# Patient Record
Sex: Male | Born: 1965
Health system: Southern US, Community
[De-identification: ages and names within clinical notes are randomized; demographics above are authoritative.]

## PROBLEM LIST (undated history)

## (undated) DIAGNOSIS — G4489 Other headache syndrome: Secondary | ICD-10-CM

## (undated) DIAGNOSIS — I10 Essential (primary) hypertension: Secondary | ICD-10-CM

## (undated) DIAGNOSIS — R569 Unspecified convulsions: Secondary | ICD-10-CM

## (undated) DIAGNOSIS — C801 Malignant (primary) neoplasm, unspecified: Secondary | ICD-10-CM

## (undated) DIAGNOSIS — C649 Malignant neoplasm of unspecified kidney, except renal pelvis: Secondary | ICD-10-CM

## (undated) DIAGNOSIS — H9319 Tinnitus, unspecified ear: Secondary | ICD-10-CM

## (undated) DIAGNOSIS — G4730 Sleep apnea, unspecified: Secondary | ICD-10-CM

## (undated) HISTORY — DX: Tinnitus, unspecified ear: H93.19

## (undated) HISTORY — PX: SHOULDER SURGERY: SHX246

## (undated) HISTORY — DX: Unspecified convulsions: R56.9

## (undated) HISTORY — PX: KNEE SURGERY: SHX244

## (undated) HISTORY — DX: Other headache syndrome: G44.89

## (undated) HISTORY — DX: Sleep apnea, unspecified: G47.30

---

## 2015-06-30 DIAGNOSIS — Z8249 Family history of ischemic heart disease and other diseases of the circulatory system: Secondary | ICD-10-CM | POA: Diagnosis not present

## 2015-06-30 DIAGNOSIS — E785 Hyperlipidemia, unspecified: Secondary | ICD-10-CM | POA: Diagnosis not present

## 2015-06-30 DIAGNOSIS — R0789 Other chest pain: Secondary | ICD-10-CM | POA: Diagnosis not present

## 2015-06-30 DIAGNOSIS — F17200 Nicotine dependence, unspecified, uncomplicated: Secondary | ICD-10-CM | POA: Diagnosis not present

## 2015-07-01 DIAGNOSIS — F17200 Nicotine dependence, unspecified, uncomplicated: Secondary | ICD-10-CM | POA: Diagnosis not present

## 2015-07-01 DIAGNOSIS — R0789 Other chest pain: Secondary | ICD-10-CM | POA: Diagnosis not present

## 2015-07-01 DIAGNOSIS — E785 Hyperlipidemia, unspecified: Secondary | ICD-10-CM | POA: Diagnosis not present

## 2015-07-01 DIAGNOSIS — Z8249 Family history of ischemic heart disease and other diseases of the circulatory system: Secondary | ICD-10-CM | POA: Diagnosis not present

## 2015-12-22 DIAGNOSIS — G4733 Obstructive sleep apnea (adult) (pediatric): Secondary | ICD-10-CM | POA: Diagnosis not present

## 2015-12-22 DIAGNOSIS — R569 Unspecified convulsions: Secondary | ICD-10-CM | POA: Diagnosis not present

## 2016-06-21 DIAGNOSIS — Z0000 Encounter for general adult medical examination without abnormal findings: Secondary | ICD-10-CM | POA: Diagnosis not present

## 2016-06-21 DIAGNOSIS — G8929 Other chronic pain: Secondary | ICD-10-CM | POA: Diagnosis not present

## 2016-06-21 DIAGNOSIS — G4733 Obstructive sleep apnea (adult) (pediatric): Secondary | ICD-10-CM | POA: Diagnosis not present

## 2016-06-21 DIAGNOSIS — R569 Unspecified convulsions: Secondary | ICD-10-CM | POA: Diagnosis not present

## 2016-06-21 DIAGNOSIS — M25562 Pain in left knee: Secondary | ICD-10-CM | POA: Diagnosis not present

## 2016-06-21 DIAGNOSIS — Z130 Encounter for screening for diseases of the blood and blood-forming organs and certain disorders involving the immune mechanism: Secondary | ICD-10-CM | POA: Diagnosis not present

## 2016-06-21 DIAGNOSIS — Z13220 Encounter for screening for lipoid disorders: Secondary | ICD-10-CM | POA: Diagnosis not present

## 2016-11-26 DIAGNOSIS — R569 Unspecified convulsions: Secondary | ICD-10-CM | POA: Diagnosis not present

## 2016-11-28 DIAGNOSIS — G4730 Sleep apnea, unspecified: Secondary | ICD-10-CM | POA: Diagnosis not present

## 2016-11-28 DIAGNOSIS — G4489 Other headache syndrome: Secondary | ICD-10-CM | POA: Diagnosis not present

## 2016-11-28 DIAGNOSIS — G40909 Epilepsy, unspecified, not intractable, without status epilepticus: Secondary | ICD-10-CM | POA: Diagnosis not present

## 2016-11-28 DIAGNOSIS — R569 Unspecified convulsions: Secondary | ICD-10-CM | POA: Diagnosis not present

## 2016-12-17 ENCOUNTER — Encounter: Payer: Self-pay | Admitting: Neurology

## 2016-12-18 ENCOUNTER — Other Ambulatory Visit: Payer: Self-pay | Admitting: Neurology

## 2016-12-18 ENCOUNTER — Ambulatory Visit (INDEPENDENT_AMBULATORY_CARE_PROVIDER_SITE_OTHER): Payer: BLUE CROSS/BLUE SHIELD | Admitting: Neurology

## 2016-12-18 ENCOUNTER — Encounter: Payer: Self-pay | Admitting: Neurology

## 2016-12-18 VITALS — BP 139/91 | HR 82 | Ht 67.0 in | Wt 234.0 lb

## 2016-12-18 DIAGNOSIS — G4733 Obstructive sleep apnea (adult) (pediatric): Secondary | ICD-10-CM | POA: Diagnosis not present

## 2016-12-18 DIAGNOSIS — R569 Unspecified convulsions: Secondary | ICD-10-CM

## 2016-12-18 DIAGNOSIS — Z9989 Dependence on other enabling machines and devices: Secondary | ICD-10-CM | POA: Diagnosis not present

## 2016-12-18 DIAGNOSIS — G40909 Epilepsy, unspecified, not intractable, without status epilepticus: Secondary | ICD-10-CM

## 2016-12-18 NOTE — Patient Instructions (Signed)
Seizure, Adult °When you have a seizure: °· Parts of your body may move. °· How aware or awake (conscious) you are may change. °· You may shake (convulse). ° °Some people have symptoms right before a seizure happens. These symptoms may include: °· Fear. °· Worry (anxiety). °· Feeling like you are going to throw up (nausea). °· Feeling like the room is spinning (vertigo). °· Feeling like you saw or heard something before (deja vu). °· Odd tastes or smells. °· Changes in vision, such as seeing flashing lights or spots. ° °Seizures usually last from 30 seconds to 2 minutes. Usually, they are not harmful unless they last a long time. °Follow these instructions at home: °Medicines °· Take over-the-counter and prescription medicines only as told by your doctor. °· Avoid anything that may keep your medicine from working, such as alcohol. °Activity °· Do not do any activities that would be dangerous if you had another seizure, like driving or swimming. Wait until your doctor approves. °· If you live in the [place] ask your local DMV (department of motor vehicles) when you can drive. °· Rest. °Teaching others °· Teach friends and family what to do when you have a seizure. They should: °? Lay you on the ground. °? Protect your head and body. °? Loosen any tight clothing around your neck. °? Turn you on your side. °? Stay with you until you are better. °? Not hold you down. °? Not put anything in your mouth. °? Know whether or not you need emergency care. °General instructions °· Contact your doctor each time you have a seizure. °· Avoid anything that gives you seizures. °· Keep a seizure diary. Write down: °? What you think caused each seizure. °? What you remember about each seizure. °· Keep all follow-up visits as told by your doctor. This is important. °Contact a doctor if: °· You have another seizure. °· You have seizures more often. °· There is any change in what happens during your seizures. °· You continue to have  seizures with treatment. °· You have symptoms of being sick or having an infection. °Get help right away if: °· You have a seizure: °? That lasts longer than 5 minutes. °? That is different than seizures you had before. °? That makes it harder to breathe. °? After you hurt your head. °· After a seizure, you cannot speak or use a part of your body. °· After a seizure, you are confused or have a bad headache. °· You have two or more seizures in a row. °· You are having seizures more often. °· You do not wake up right after a seizure. °· You get hurt during a seizure. °In an emergency: °· These symptoms may be an emergency. Do not wait to see if the symptoms will go away. Get medical help right away. Call your local emergency services (911 in the [place] Do not drive yourself to the hospital. °This information is not intended to replace advice given to you by your health care provider. Make sure you discuss any questions you have with your health care provider. °Document Released: 07/11/2007 Document Revised: 10/05/2015 Document Reviewed: 10/05/2015 °Elsevier Interactive Patient Education © 2017 Elsevier Inc. ° °

## 2016-12-18 NOTE — Progress Notes (Signed)
SLEEP MEDICINE CLINIC   Provider:  Larey Seat, M D  Primary Care Physician:  Glenford Bayley, DO   Referring Provider: Glenford Bayley, DO    Chief Complaint  Patient presents with  . New Patient (Initial Visit)    pt has had seizures about 5 years ago at which time he was worked up for sleep apnea. sleep study was completed in 2015. pt was started on CPAP.    HPI:  Vincent David is a 51 y.o. male , seen here  in a referral  from Dr. Marin Comment for evaluation of OSA and nocturnal seizures -He had a seizure on  11-25-2016   He is reports that his very first seizure that was noticed at that took place probably in the year 2012 prior to his first sleep evaluation.  He also had a seizure during sleep in January 2013 and was hospitalized EEG in the hospital at the time at [facility] returned negative.  There was no special etiology found when he had yet another nocturnal seizure in July 2013.  He was not on any medications at the time and had not started on antiepileptic medications.  It was noted that the patient was a current smoker, that he drinks 3-4 glasses of wine per week, that he also wakes up with a dry mouth.  He had no history of documented traumatic brain injury but reports a head injury during [employer] in [place] , in [place]. He was treated for complicated migraines, with a visual aura.   .   No PLM Arousals and he was prescribed a ResMed Quattro FFM in medium size with heated humidifier and an EPR pressure of 3 cm water.    Chief complaint according to patient : I need CPAP supplies.  CPAP was downloaded by my staff today and shows 100% compliant patient with an average use at time of 8 hours and 24 minutes, a residual AHI of only 1.2 apneas per hour, a CPAP setting of 11 cmH2O was 3 cm EPR.  Sleep habits are as follows: At about 9 PM, generally asleep rather promptly.  To sleep on his side his bedroom is cool, quiet and dark.  Conducive to sleep.  He uses 2 pillows.  He sleeps through the the first 3-4 hours of the night - and wakes up at 4.30 AM, no nocturia. His wife has noted rarely snoring break through on CPAP.  He has a chronically congested nose with a septal deviation, to the left.  The patient is a habitual mouth breather due to his nasal patency. Sleeps about 6-7 hours. He naps on week ends for 1-2 hours. He wakes up generally refreshed and restored.    Sleep medical history and family sleep history:  Nocturnal seizures, his wife usually wakes him. He is confused and very tired. Tongue bite. Urinary incontinence.     Social history: Married.  Active smoker, 1 ppd, ETOH-  2 drinks a day, with dinner- 5 beers when golfing.  caffeine use, 2 coffees in AM- iced tea,decaffeinated. Diet coke 1-2 a day.   Review of Systems: Out of a complete 14 system review, the patient complains of only the following symptoms, and all other reviewed systems are negative. Seizures at night.   Epworth score  3 , Fatigue severity score 18  , depression score 2/1 5    Social History   Socioeconomic History  . Marital status: Married    Spouse name: Not on file  .  Number of children: Not on file  . Years of education: Not on file  . Highest education level: Not on file  Social Needs  . Financial resource strain: Not on file  . Food insecurity - worry: Not on file  . Food insecurity - inability: Not on file  . Transportation needs - medical: Not on file  . Transportation needs - non-medical: Not on file  Occupational History  . Not on file  Tobacco Use  . Smoking status: Current Every Day Smoker    Packs/day: 1.00  . Smokeless tobacco: Never Used  Substance and Sexual Activity  . Alcohol use: Yes  . Drug use: No  . Sexual activity: Not on file  Other Topics Concern  . Not on file  Social History Narrative  . Not on file    No family history on file.  Past Medical History:  Diagnosis Date  . Postictal headache   . Seizure ([place])   . Sleep apnea       No past surgical history on file.  Current Outpatient Medications  Medication Sig Dispense Refill  . levETIRAcetam (KEPPRA) 1000 MG tablet Take 1,000 mg 2 (two) times daily by mouth.    . levETIRAcetam (KEPPRA) 500 MG tablet Take 500 mg at bedtime by mouth.     No current facility-administered medications for this visit.     Allergies as of 12/18/2016  . (Not on File)    Vitals: BP (!) 139/91   Pulse 82   Ht 5\' 7"  (1.702 m)   Wt 234 lb (106.1 kg)   BMI 36.65 kg/m  Last Weight:  Wt Readings from Last 1 Encounters:  12/18/16 234 lb (106.1 kg)   HBZ:JIRC mass index is 36.65 kg/m.     Last Height:   Ht Readings from Last 1 Encounters:  12/18/16 5\' 7"  (1.702 m)    Physical exam:  General: The patient is awake, alert and appears not in acute distress. The patient is well groomed. Head: Normocephalic, atraumatic. Neck is supple. Mallampati 4,  neck circumference:18.5 . Nasal airflow congested ,.  Cardiovascular:  Regular rate and rhythm, without  murmurs or carotid bruit, and without distended neck veins. Respiratory: Lungs are clear to auscultation. Skin:  Without evidence of edema,  Facial erythema,  Rosacea? Trunk: BMI is 37. The patient's posture is erect   Neurologic exam : The patient is awake and alert, oriented to place and time.    Attention span & concentration ability appears normal.  Speech is fluent,  without dysarthria, dysphonia or aphasia.  Mood and affect are appropriate.  Cranial nerves: Pupils are equal and briskly reactive to light. Icteric changes in both sclerea. Extraocular movements  in vertical and horizontal planes intact and without nystagmus. Visual fields by finger perimetry are intact. Hearing to finger rub intact.  Facial sensation intact to fine touch.Facial motor strength is symmetric and tongue and uvula move midline. Shoulder shrug was symmetrical. Left shoulder ROM restricted.   Motor exam:   Normal tone, muscle bulk and symmetric  strength in all extremities. Sensory:  Fine touch, pinprick and vibration were tested in all extremities. Proprioception tested in the upper extremities was normal. Coordination: Rapid alternating movements in the fingers/hands was normal. Finger-to-nose maneuver  normal without evidence of ataxia, dysmetria or tremor. Gait and station: Patient walks without assistive device and is able unassisted to climb up to the exam table. Deep tendon reflexes: in the  upper and lower extremities are symmetric and intact. Babinski  maneuver response is  downgoing.   Assessment:  After physical and neurologic examination, review of laboratory studies,  Personal review of imaging studies, reports of other /same  Imaging studies, results of polysomnography and / or neurophysiology testing and pre-existing records as far as provided in visit., my assessment is   1) Vincent David will need new supplies for his current CPAP machine, which is too new to be replaced at this time.  He endorsed a low-grade fatigue and daytime sleepiness scale the Epworth score was endorsed at 3 points fatigue severity at 18 point and there is no indication that his CPAP would not serve him very well at this time.  I was able to get a compliance report and he is 100% compliant patient.  He requests also a new Quatro size he will need filter, tubing   2) the patient has a history of nocturnal seizures which still occur after he began medication treatment with generic Keppra, namely levetiracetam he has been on this medication for about about 3-1/2 years.  Infrequently to have nocturnal seizures. His wife added that they actually followed a crescendo pattern and have become more frequent this led to an increase in Keppra dosing and he is currently taking 2500 mg daily divided twice daily.   3) [facility] evaluation for seizures q 3-6 month. Has not had a EEG, only a CT>   The patient was advised of the nature of the diagnosed disorder , the treatment  options and the  risks for general health and wellness arising from not treating the condition.   I spent more than 45  minutes of face to face time with the patient.  Greater than 50% of time was spent in counseling and coordination of care. We have discussed the diagnosis and differential and I answered the patient's questions.    Plan:  Treatment plan and additional workup : Nocturnal seizure disorder requiring a was expanded EEG for seizure montage.  Patient will receive new CPAP supplies for his current machine which is about 72-1/53 years old.  The placement of the CPAP machine is usually not done before the machine is 53 years old. Patient is taking anti-epileptica.  He is taking a generic form of Keppra. He can stay on this medication. I will order a prolonged EEG.  Seizure activity has been noted to be closely correlated to the first 2 or 3 hours of sleep and extensions that have been seizures noted in the last hour of sleep.  Never had a daytime seizure. And an overnight EEG in EMU in [place], [place].  Negative. EEG -  Repeat  SUDEP education.      Larey Seat, MD 16/11/9602, 54:09 PM  Certified in Neurology by ABPN Certified in [place] by [facility] [address], [place], [place] [address]

## 2016-12-18 NOTE — Addendum Note (Signed)
Addended by: Larey Seat on: 12/18/2016 01:05 PM   Modules accepted: Orders

## 2016-12-24 ENCOUNTER — Telehealth: Payer: Self-pay

## 2016-12-24 NOTE — Telephone Encounter (Signed)
Patient was contacted by me to schedule sleep study. He was asking about Rx for supplies sent to DME. Saw no note about this.  Pt stated that he is not currently getting any supplies for CPAP machine.  Please check into this and follow up with pt. Thank You!!

## 2016-12-24 NOTE — Telephone Encounter (Signed)
Called the patient to make him aware that an order was sent to [facility] on 12/18/2016. I have given him the number for [facility] so that he can reach out to them personally.

## 2017-02-07 ENCOUNTER — Ambulatory Visit (INDEPENDENT_AMBULATORY_CARE_PROVIDER_SITE_OTHER): Payer: BLUE CROSS/BLUE SHIELD | Admitting: Neurology

## 2017-02-07 DIAGNOSIS — R569 Unspecified convulsions: Secondary | ICD-10-CM

## 2017-02-07 DIAGNOSIS — G4733 Obstructive sleep apnea (adult) (pediatric): Secondary | ICD-10-CM

## 2017-02-07 DIAGNOSIS — Z9989 Dependence on other enabling machines and devices: Principal | ICD-10-CM

## 2017-02-07 DIAGNOSIS — G40909 Epilepsy, unspecified, not intractable, without status epilepticus: Secondary | ICD-10-CM

## 2017-02-11 ENCOUNTER — Other Ambulatory Visit: Payer: Self-pay | Admitting: Neurology

## 2017-02-11 DIAGNOSIS — G4733 Obstructive sleep apnea (adult) (pediatric): Secondary | ICD-10-CM

## 2017-02-11 DIAGNOSIS — E669 Obesity, unspecified: Secondary | ICD-10-CM

## 2017-02-11 DIAGNOSIS — G4761 Periodic limb movement disorder: Secondary | ICD-10-CM

## 2017-02-11 NOTE — Procedures (Signed)
PATIENT'S NAME:  Vincent David, Vincent David DOB:      Oct 16, 1965      MRN:    [id number]     DATE OF RECORDING: 02/07/2017 REFERRING M.D.:  Rikki Spearing, M.D. Study Performed:   Baseline Polysomnogram with EEG montage HISTORY:   52 year old male Seizure patient reports his very first seizure took place probably in the year 2012 prior to his first sleep evaluation.  He also had a seizure during sleep in January 2013 and was hospitalized for EEG in the hospital at the time at [facility], [place]- the EEG returned negative.  There was no special etiology found when he had yet another nocturnal seizure in July 2013.  He was not on any medications at the time and had not started on antiepileptic medications. Last seizure on 02-26-2016 and referred here on Keppra.  He reports a head injury during [employer] in 1986. He was treated for complicated migraines with visual aura.  The patient endorsed the Epworth Sleepiness Scale at 3 points.  The patient's weight 234 pounds with a height of 67 (inches), resulting in a BMI of 36.7 kg/m2.The patient's neck circumference measured 18 inches.  CURRENT MEDICATIONS: Keppra   PROCEDURE:  This is a multichannel digital polysomnogram utilizing the Somnostar 11.2 system.  Electrodes and sensors were applied and monitored per AASM Specifications.   EEG, EOG, Chin and Limb EMG, were sampled at 200 Hz.  ECG, Snore and Nasal Pressure, Thermal Airflow, Respiratory Effort, CPAP Flow and Pressure, Oximetry was sampled at 50 Hz. Digital video and audio were recorded.      BASELINE STUDY: Lights Out was at 21:12 and Lights On at 04:29.  Total recording time (TRT) was 438 minutes, with a total sleep time (TST) of 358 minutes.   The patient's sleep latency was 53 minutes.  REM latency was 178.5 minutes.  The sleep efficiency was 81.7 %.     SLEEP ARCHITECTURE: WASO (Wake after sleep onset) was 41.5 minutes.  There were 70.5 minutes in Stage N1, 193 minutes Stage N2, 46.5 minutes Stage N3 and  48 minutes in Stage REM.  The percentage of Stage N1 was 19.7%, Stage N2 was 53.9%, Stage N3 was at 13.0% and Stage R (REM sleep) was 13.4%.   RESPIRATORY ANALYSIS:  There were a total of 82 respiratory events:  2 obstructive apneas, 12 central apneas and 0 mixed apneas with 68 hypopneas. The patient also had 2 respiratory event related arousals (RERAs).The total APNEA/HYPOPNEA INDEX (AHI) was 13.7/hour and the total RESPIRATORY DISTURBANCE INDEX was 14.1 /hour.   6 events occurred in REM sleep and 136 events in NREM. The REM AHI was 7.5 /hour, versus a non-REM AHI of 14.7. The patient spent 0 minutes of total sleep time in the supine position and 358 minutes in non-supine, with a non-supine AHI of 13.7.  OXYGEN SATURATION & C02: The Wake baseline 02 saturation was 94%, with the lowest being 87%. Time spent below 89% saturation equaled 5 minutes.   PERIODIC LIMB MOVEMENTS:  The patient had a total of 168 Periodic Limb Movements.  The Periodic Limb Movement (PLM) index was 28.2 and the PLM Arousal index was 6.2/hour.  The arousals were noted as: 63 were spontaneous, 37 were associated with PLMs, and 36 were associated with respiratory events. Loud Snoring was noted, but only mild apnea seen. Audio and video analysis did not show any abnormal or unusual movements, behaviors, phonations or vocalizations.  Respiratory events all begun after midnight and were  unrelated to REM/NREM or sleep position (left versus right).  The patient took one bathroom break. EKG was in keeping with normal sinus rhythm (NSR) with some PACs.  Post-study, the patient indicated that sleep was worse than usual.   IMPRESSION:  1. Mild Obstructive Sleep Apnea (OSA) with an AHI of 13.7/hr. associated with loud snoring, but neither hypoxemia nor hypercapnia. 2. Severe Periodic Limb Movement Disorder (PLMD) 3. Thunderous Snoring 4. Repetitive Intrusions of Sleep, but EEG was without evidence of seizure activity.    RECOMMENDATIONS: 1. Advise full-night, attended, CPAP titration study to optimize therapy. Avoid sedative-hypnotics which may worsen sleep apnea, and to lose weight by diet and exercise if not contraindicated (BMI 36.7). 2. Further information regarding OSA may be obtained from [employer] (www.sleepfoundation.org) or American Sleep Apnea Association (www.sleepapnea.org).   3. There were frequent periodic limb movements of sleep (PLMS) with associated sleep disruption.  Consider treating the PLMS primarily if CPAP does not reduce the arousal frequency.   4. Correlate clinically for a history consistent with regarding restless legs syndrome (RLS).    Consider secondary restless legs syndrome.  Pharmacotherapy may be warranted.  Obtain a serum ferritin level if the clinical history is consistent with RLS.  Consider iron therapy and evaluation for iron deficiency anemia if the serum ferritin level < 50 ng/ml.  5. Certain medications or substances may aggravate RLS and common offenders may include the following:  nicotine, caffeine, SSRIs, TCAs, phenothiazine, dopamine antagonists, diphenhydramine, and alcohol.   6. A follow up appointment will be scheduled in the Sleep Clinic at [facility]. The referring provider will be notified of the results.      I certify that I have reviewed the entire raw data recording prior to the issuance of this report in accordance with the Standards of Accreditation of the American Academy of Sleep Medicine (AASM)   Larey Seat, MD     02-11-2017 Diplomat, American Board of Psychiatry and Neurology  Diplomat, American Board of [place] Director, [employer] [employer]

## 2017-02-12 ENCOUNTER — Telehealth: Payer: Self-pay | Admitting: Neurology

## 2017-02-12 NOTE — Telephone Encounter (Signed)
-----   Message from Larey Seat, MD sent at 02/11/2017  5:17 PM EST ----- Snoring and OSA  as well as PLMs found- Return for CPAP titration, with expanded seizure montage. CD

## 2017-02-12 NOTE — Telephone Encounter (Signed)
Called patient to discuss sleep study results. No answer at this time. LVM for the patient to call back.   

## 2017-02-12 NOTE — Telephone Encounter (Signed)
Pt returned call. I advised pt that Dr. Dohmeier reviewed their sleep study results and found that has sleep apnea  and recommends that pt be treated with a cpap. Dr. Dohmeier recommends that pt return for a repeat sleep study in order to properly titrate the cpap and ensure a good mask fit. Pt is agreeable to returning for a titration study. I advised pt that our sleep lab will file with pt's insurance and call pt to schedule the sleep study when we hear back from the pt's insurance regarding coverage of this sleep study. Pt verbalized understanding of results. Pt had no questions at this time but was encouraged to call back if questions arise.   

## 2017-03-29 ENCOUNTER — Ambulatory Visit (INDEPENDENT_AMBULATORY_CARE_PROVIDER_SITE_OTHER): Payer: BLUE CROSS/BLUE SHIELD | Admitting: Neurology

## 2017-03-29 DIAGNOSIS — G4733 Obstructive sleep apnea (adult) (pediatric): Secondary | ICD-10-CM | POA: Diagnosis not present

## 2017-03-29 DIAGNOSIS — G4761 Periodic limb movement disorder: Secondary | ICD-10-CM

## 2017-03-29 DIAGNOSIS — E669 Obesity, unspecified: Secondary | ICD-10-CM

## 2017-04-01 ENCOUNTER — Other Ambulatory Visit: Payer: Self-pay | Admitting: Neurology

## 2017-04-01 DIAGNOSIS — G4733 Obstructive sleep apnea (adult) (pediatric): Secondary | ICD-10-CM

## 2017-04-01 DIAGNOSIS — Z9989 Dependence on other enabling machines and devices: Secondary | ICD-10-CM

## 2017-04-01 DIAGNOSIS — G4731 Primary central sleep apnea: Principal | ICD-10-CM

## 2017-04-01 DIAGNOSIS — G4739 Other sleep apnea: Secondary | ICD-10-CM

## 2017-04-01 NOTE — Procedures (Signed)
PATIENT'S NAME:  Vincent David, Vincent David DOB:      1965-06-26      MR#:    [id number]     DATE OF RECORDING: 03/29/2017 REFERRING M.D.:  Rikki Spearing, MD Study Performed:   Titration to CPAP, BiPAP and ASV modalities.  HISTORY:  This 52 year old male patient of Rikki Spearing, MD, has seizures and is a CPAP user. He is returning for CPAP titration following a PSG performed on 02/07/2017 which resulted in an AHI of 13.7/hr., loud snoring, severe PLMD, and normal sleep EEG. There were neither hypoxemia nor hypercapnia noted.  SpO2 Nadir was 87%. The patient endorsed the Epworth Sleepiness Scale at 3 points.  The patient's weight 234 pounds with a height of 67 (inches), resulting in a BMI of 36.7 kg/m2.The patient's neck circumference measured 18 inches.  CURRENT MEDICATIONS: Keppra   PROCEDURE:  This is a multichannel digital polysomnogram utilizing the SomnoStar 11.2 system.  Electrodes and sensors were applied and monitored per AASM Specifications.   EEG, EOG, Chin and Limb EMG, were sampled at 200 Hz.  ECG, Snore and Nasal Pressure, Thermal Airflow, Respiratory Effort, CPAP Flow and Pressure, Oximetry was sampled at 50 Hz. Digital video and audio were recorded.      CPAP was initiated at 5 cmH20 with heated humidity per AASM split night standards and pressure was advanced to 15 cm water, the technician changed to BiPAP and ASV mode during the same night- BiPAP final pressure of 18/14cmH20 with or without ST did not improve on hypopneas, apneas and created a [employer] pattern ( AHI of 45). The modality was changed to ASV which helped little, reducing the AHI to 13/hr.     At a CPAP pressure of 12 cmH20, there was a reduction of the AHI to 0.0 with Spo2 nadir at 92% and 110 minutes of sleep time with improvement of obstructive sleep apnea.    Lights Out was at 21:02 and Lights On at 05:00. Total recording time (TRT) was 478.5 minutes, with a total sleep time (TST) of 398 minutes. The patient's sleep latency was 13 minutes  with 5 minutes of wake time after sleep onset. REM latency was 69 minutes.  The sleep efficiency was 83.2 %.    SLEEP ARCHITECTURE: WASO (Wake after sleep onset) was 68 minutes.  There were 45 minutes in Stage N1, 152.5 minutes Stage N2, 101 minutes Stage N3 and 99.5 minutes in Stage REM.  The percentage of Stage N1 was 11.3%, Stage N2 was 38.3%, Stage N3 was 25.4% and Stage R (REM sleep) was 25%.   RESPIRATORY ANALYSIS:  There was a total of 22 respiratory events: 1 obstructive apnea, 21 central apneas and 0 hypopneas with 5 respiratory event related arousals (RERAs).     The total APNEA/HYPOPNEA INDEX (AHI) was 3.3 /hour and the total RESPIRATORY DISTURBANCE INDEX was 4.1 .hour. 7 events occurred in REM sleep and 15 events in NREM. The REM AHI was 4.2 /hr. versus a non-REM AHI of 3.0 /hr.  The patient spent 66.5 minutes of total sleep time in the supine position and 332 minutes in non-supine. The supine AHI was 19.8, versus a non-supine AHI of 0.0/hr.  OXYGEN SATURATION & C02:  The baseline 02 saturation was 94%, with the lowest being 88%. Time spent below 89% saturation equaled 1 minute.  PERIODIC LIMB MOVEMENTS:   The patient had a total of 0 Periodic Limb Movements. The arousals were noted as: 80 were spontaneous, 0 were associated with PLMs, and  9 were associated with respiratory events. Audio and video analysis did not show any abnormal or unusual movements, behaviors, phonations or vocalizations.   The patient took one bathroom break. Snoring was no longer noted. EKG was in keeping with normal sinus rhythm (NSR).  CPAP worked sufficiently as long as the patient remained in a non-supine sleep position.   Post-study, the patient indicated that sleep was the same as usual.  The patient was fitted with a ResMed AirFit F30 (FFM) in medium. He prefers his FFM from home .   DIAGNOSIS 1. Obstructive Sleep Apnea, supine dependent, responding to CPAP at 9 through 12 cm water pressure, but only in  non -supine sleep position.  The lateral sleep position is difficult for a full face mask user.  2. Central Sleep Apnea was triggered by BiPAP pressure 18/14 cm.  3. ASV had little benefit.  PLANS/RECOMMENDATIONS: We will provide the patient with an auto titration CPAP, between 6 and 12 cm water pressure, heated humidity- and he may name the mask of his choice. 1. Educate patient in sleep hygiene measures and Tennis ball method. The patient needs to avoid supine sleep position.  Achieve and Maintain lean body weight (BMI indicative of morbid obesity at 37). 2. Positional therapy:  This study indicates that patient has significantly more apneas in the supine position.  It is recommended that the patient use a device which encourages sleep in the non-supine position (e.g., tennis ball-in-back method, using fanny pack or similar device to position elastic ball in small of back during sleep, in order to encourage sleep in the non- supine position). DISCUSSION:  A follow up appointment will be scheduled in the Sleep Clinic at [facility].   Please call [phone] with any questions.     I certify that I have reviewed the entire raw data recording prior to the issuance of this report in accordance with the Standards of Accreditation of the American Academy of Sleep Medicine (AASM)  Larey Seat, M.D.     04-01-2017  Diplomat, American Board of Psychiatry and Neurology  Diplomat, [place] of Sleep Medicine Medical Director, [place] [facility] at [facility]

## 2017-04-02 ENCOUNTER — Telehealth: Payer: Self-pay | Admitting: Neurology

## 2017-04-02 NOTE — Telephone Encounter (Signed)
I called pt. I advised pt that Dr. Brett Fairy reviewed their sleep study results and found that pt has sleep apnea. Dr. Brett Fairy recommends that pt has sleep apnea. I reviewed PAP compliance expectations with the pt. Pt is agreeable to starting a CPAP. I advised pt that an order will be sent to a DME, [facility], and [facility] will call the pt within about one week after they file with the pt's insurance. [facility] will show the pt how to use the machine, fit for masks, and troubleshoot the CPAP if needed. A follow up appt was made for insurance purposes with Dr Brett Fairy on Jun 18, 2017 at 8:30 am. Pt verbalized understanding to arrive 15 minutes early and bring their CPAP. A letter with all of this information in it will be mailed to the pt as a reminder. I verified with the pt that the address we have on file is correct. Pt verbalized understanding of results. Pt had no questions at this time but was encouraged to call back if questions arise.

## 2017-04-02 NOTE — Telephone Encounter (Signed)
-----   Message from Larey Seat, MD sent at 04/01/2017  5:56 PM EST ----- PATIENT'S NAME:  Vincent David, Vincent David DOB:      Jul 28, 1965      MR#:    [id number]     DATE OF RECORDING: 03/29/2017 REFERRING M.D.:  Rikki Spearing, MD Study Performed:   Titration to CPAP, BiPAP and ASV modalities.  HISTORY:  This 52 year old male patient of Rikki Spearing, MD, has seizures and is a CPAP user. He is returning for CPAP titration following a PSG performed on 02/07/2017 which resulted in an AHI of 13.7/hr., loud snoring, severe PLMD, and normal sleep EEG. There were neither hypoxemia nor hypercapnia noted.  SpO2 Nadir was 87%. The patient endorsed the Epworth Sleepiness Scale at 3 points.  The patient's weight 234 pounds with a height of 67 (inches), resulting in a BMI of 36.7 kg/m2.The patient's neck circumference measured 18 inches.  CPAP worked sufficiently as long as the patient remained in a non-supine sleep position.  The patient was fitted with a ResMed AirFit F30 (FFM) in medium. He prefers his FFM from home .   DIAGNOSIS 1. Obstructive Sleep Apnea, supine dependent, responding to CPAP at 9 through 12 cm water pressure, but only in non -supine sleep position.  The lateral sleep position is difficult for a full face mask user.  2. Central Sleep Apnea was triggered by BiPAP pressure 18/14 cm.  3. ASV had little benefit.  PLANS/RECOMMENDATIONS: We will provide the patient with an auto titration CPAP, between 6 and 12 cm water pressure, heated humidity- and he may name the mask of his choice. 1. Educate patient in sleep hygiene measures and Tennis ball method. The patient needs to avoid supine sleep position.  Achieve and Maintain lean body weight (BMI indicative of morbid obesity at 37). 2. Positional therapy:  This study indicates that patient has significantly more apneas in the supine position.  It is recommended that the patient use a device which encourages sleep in the non-supine position (e.g., tennis ball-in-back  method, using fanny pack or similar device to position elastic ball in small of back during sleep, in order to encourage sleep in the non- supine position). DISCUSSION: A follow up appointment will be scheduled in the Sleep Clinic at [facility].   Please call [phone] with any questions.     Larey Seat, M.D.     04-01-2017  Diplomat, American Board of Psychiatry and Neurology  Diplomat, [place] of Sleep Medicine Medical Director, [place] [facility] at [facility]

## 2017-04-18 DIAGNOSIS — G4733 Obstructive sleep apnea (adult) (pediatric): Secondary | ICD-10-CM | POA: Diagnosis not present

## 2017-05-19 DIAGNOSIS — G4733 Obstructive sleep apnea (adult) (pediatric): Secondary | ICD-10-CM | POA: Diagnosis not present

## 2017-06-18 ENCOUNTER — Ambulatory Visit (INDEPENDENT_AMBULATORY_CARE_PROVIDER_SITE_OTHER): Payer: BLUE CROSS/BLUE SHIELD | Admitting: Neurology

## 2017-06-18 ENCOUNTER — Encounter: Payer: Self-pay | Admitting: Neurology

## 2017-06-18 VITALS — BP 130/78 | HR 82 | Ht 67.0 in | Wt 232.0 lb

## 2017-06-18 DIAGNOSIS — Z9989 Dependence on other enabling machines and devices: Secondary | ICD-10-CM

## 2017-06-18 DIAGNOSIS — G40909 Epilepsy, unspecified, not intractable, without status epilepticus: Secondary | ICD-10-CM | POA: Diagnosis not present

## 2017-06-18 DIAGNOSIS — G4733 Obstructive sleep apnea (adult) (pediatric): Secondary | ICD-10-CM | POA: Diagnosis not present

## 2017-06-18 DIAGNOSIS — R569 Unspecified convulsions: Secondary | ICD-10-CM

## 2017-06-18 MED ORDER — LEVETIRACETAM 500 MG PO TABS: 500.0000 mg | ORAL_TABLET | Freq: Every day | ORAL | 3 refills | Status: DC

## 2017-06-18 MED ORDER — LEVETIRACETAM 1000 MG PO TABS: 1000.0000 mg | ORAL_TABLET | Freq: Two times a day (BID) | ORAL | 3 refills | Status: DC

## 2017-06-18 NOTE — Patient Instructions (Signed)
Seizure, Adult °When you have a seizure: °· Parts of your body may move. °· How aware or awake (conscious) you are may change. °· You may shake (convulse). ° °Some people have symptoms right before a seizure happens. These symptoms may include: °· Fear. °· Worry (anxiety). °· Feeling like you are going to throw up (nausea). °· Feeling like the room is spinning (vertigo). °· Feeling like you saw or heard something before (deja vu). °· Odd tastes or smells. °· Changes in vision, such as seeing flashing lights or spots. ° °Seizures usually last from 30 seconds to 2 minutes. Usually, they are not harmful unless they last a long time. °Follow these instructions at home: °Medicines °· Take over-the-counter and prescription medicines only as told by your doctor. °· Avoid anything that may keep your medicine from working, such as alcohol. °Activity °· Do not do any activities that would be dangerous if you had another seizure, like driving or swimming. Wait until your doctor approves. °· If you live in the [place] ask your local DMV (department of motor vehicles) when you can drive. °· Rest. °Teaching others °· Teach friends and family what to do when you have a seizure. They should: °? Lay you on the ground. °? Protect your head and body. °? Loosen any tight clothing around your neck. °? Turn you on your side. °? Stay with you until you are better. °? Not hold you down. °? Not put anything in your mouth. °? Know whether or not you need emergency care. °General instructions °· Contact your doctor each time you have a seizure. °· Avoid anything that gives you seizures. °· Keep a seizure diary. Write down: °? What you think caused each seizure. °? What you remember about each seizure. °· Keep all follow-up visits as told by your doctor. This is important. °Contact a doctor if: °· You have another seizure. °· You have seizures more often. °· There is any change in what happens during your seizures. °· You continue to have  seizures with treatment. °· You have symptoms of being sick or having an infection. °Get help right away if: °· You have a seizure: °? That lasts longer than 5 minutes. °? That is different than seizures you had before. °? That makes it harder to breathe. °? After you hurt your head. °· After a seizure, you cannot speak or use a part of your body. °· After a seizure, you are confused or have a bad headache. °· You have two or more seizures in a row. °· You are having seizures more often. °· You do not wake up right after a seizure. °· You get hurt during a seizure. °In an emergency: °· These symptoms may be an emergency. Do not wait to see if the symptoms will go away. Get medical help right away. Call your local emergency services (911 in the [place] Do not drive yourself to the hospital. °This information is not intended to replace advice given to you by your health care provider. Make sure you discuss any questions you have with your health care provider. °Document Released: 07/11/2007 Document Revised: 10/05/2015 Document Reviewed: 10/05/2015 °Elsevier Interactive Patient Education © 2017 Elsevier Inc. ° °

## 2017-06-18 NOTE — Progress Notes (Signed)
SLEEP MEDICINE CLINIC   Provider:  Larey Seat, M D  Primary Care Physician:  Glenford Bayley, DO   Referring Provider: Glenford Bayley, DO    Chief Complaint  Patient presents with  . New Patient (Initial Visit)    pt has had seizures about 5 years ago at which time he was worked up for sleep apnea. sleep study was completed in 2015. pt was started on CPAP.    HPI:  Vincent David is a 52 y.o. male , seen here  in a referral  from Dr. Marin Comment for evaluation of OSA and nocturnal seizures -   He had a seizure on  11-25-2016 .He is reports that his very first seizure that was noticed at that took place probably in the year 2012 prior to his first sleep evaluation.  He also had a seizure during sleep in January 2013 and was hospitalized EEG in the hospital at the time at [facility] returned negative.  There was no special etiology found when he had yet another nocturnal seizure in July 2013.  He was not on any medications at the time and had not started on antiepileptic medications.  It was noted that the patient was a current smoker, that he drinks 3-4 glasses of wine per week, that he also wakes up with a dry mouth.  He had no history of documented traumatic brain injury but reports a head injury during [employer] in [place] , in [place]. He was treated for complicated migraines, with a visual aura.   .   No PLM Arousals and he was prescribed a ResMed Quattro FFM in medium size with heated humidifier and an EPR pressure of 3 cm water.    Chief complaint according to patient : " I need CPAP supplies ".  CPAP was downloaded by my staff today and shows 100% compliant patient with an average use at time of 8 hours and 24 minutes, a residual AHI of only 1.2 apneas per hour, a CPAP setting of 11 cmH2O was 3 cm EPR.  Sleep habits are as follows: At about 9 PM, generally asleep rather promptly.  To sleep on his side his bedroom is cool, quiet and dark.  Conducive to sleep.  He uses 2  pillows. He sleeps through the the first 3-4 hours of the night - and wakes up at 4.30 AM, no nocturia. His wife has noted rarely snoring break through on CPAP.  He has a chronically congested nose with a septal deviation, to the left.  The patient is a habitual mouth breather due to his nasal patency. Sleeps about 6-7 hours. He naps on week ends for 1-2 hours. He wakes up generally refreshed and restored.  Sleep medical history and family sleep history:  Nocturnal seizures, his wife usually wakes him. He is confused and very tired. Tongue bite. Urinary incontinence.   Social history: Married.  Active smoker, 1 ppd, ETOH-  2 drinks a day, with dinner- 5 beers when golfing.  caffeine use, 2 coffees in AM- iced tea,decaffeinated. Diet coke 1-2 a day.   06-18-2017, RV after sleep study.  Interval history for Vincent David, The patient underwent a baseline polysomnography with EEG montage on 07 February 2017, during which mild obstructive sleep apnea was also discovered with an AHI of 13.7/h associated with better loud snoring.  There was no significant oxygen deprivation or retention of CO2 noted.  He also had a lot of periodic limb movements, was twitching  and very restless during the night.  My technologist describes the snoring as thunderous.  He was therefore invited to return for CPAP titration which took place on 29 March 2017.  CPAP was initiated at 5 cm water and advanced to 15 cm water.  The technician also changed to BiPAP to see if higher pressures would be tolerable to the patient and finally to the a as the mode in the same night.  BiPAP with or without ST did not improve the apnea.  Patient did best at a CPAP pressure of 12 cmH2O.  He slept 110 minutes during the titration at this pressure and his oxygen nadir rose to 92%.  Further important is that he had 0 limb movements and that his snoring was still breaking through if he resumed supine sleep.   This is what Mrs. Singley reports here,  too. She noted snoring break through under the use of auto CPAP .  The patient has a chronically obstructive nasal passage, she also has facial hair but tolerates a full facemask much better.  I was able today to review his first 90-day compliance and he was 100% compliance with AutoPap between 6 and 12 cmH2O pressure with 3 cm EPR.  The 95th percentile pressure is 11.9 cm, he did not have central apneas and his residual AHI was 0.4.  His Epworth sleepiness score has been reduced to 3 points, and his fatigue at 18 points.    Review of Systems: Out of a complete 14 system review, the patient complains of only the following symptoms, and all other reviewed systems are negative. Seizures at night. Sleep EEG was negative for abnormal discharges.  Shoulder pain , keeping him sleeping supine. Thunderous snoring.     Epworth score  3 , Fatigue severity score 18  , depression score 2/1 5    Social History   Socioeconomic History  . Marital status: Married    Spouse name: Not on file  . Number of children: Not on file  . Years of education: Not on file  . Highest education level: Not on file  Social Needs  . Financial resource strain: Not on file  . Food insecurity - worry: Not on file  . Food insecurity - inability: Not on file  . Transportation needs - medical: Not on file  . Transportation needs - non-medical: Not on file  Occupational History  . Not on file  Tobacco Use  . Smoking status: Current Every Day Smoker    Packs/day: 1.00  . Smokeless tobacco: Never Used  Substance and Sexual Activity  . Alcohol use: Yes  . Drug use: No  . Sexual activity: Not on file  Other Topics Concern  . Not on file  Social History Narrative  . Not on file    No family history on file.  Past Medical History:  Diagnosis Date  . Postictal headache   . Seizure ([place])   . Sleep apnea     No past surgical history on file.  Current Outpatient Medications  Medication Sig Dispense Refill  .  levETIRAcetam (KEPPRA) 1000 MG tablet Take 1,000 mg 2 (two) times daily by mouth.    . levETIRAcetam (KEPPRA) 500 MG tablet Take 500 mg at bedtime by mouth.     No current facility-administered medications for this visit.     Allergies as of 12/18/2016  . (Not on File)    Vitals: BP (!) 139/91   Pulse 82   Ht 5\' 7"  (1.702 m)  Wt 234 lb (106.1 kg)   BMI 36.65 kg/m  Last Weight:  Wt Readings from Last 1 Encounters:  12/18/16 234 lb (106.1 kg)   DGU:YQIH mass index is 36.65 kg/m.     Last Height:   Ht Readings from Last 1 Encounters:  12/18/16 5\' 7"  (1.702 m)    Physical exam: General: The patient is awake, alert and appears not in acute distress. The patient is well groomed. Head: Normocephalic, atraumatic. Neck is supple. Mallampati 4,  neck circumference:18.5 . Nasal airflow congested ,.  Cardiovascular:  Regular rate and rhythm, without  murmurs or carotid bruit, and without distended neck veins. Respiratory: Lungs are clear to auscultation. Skin:  Without evidence of edema,  Facial erythema- nasal congestion.Trunk: BMI is 37.  Neurologic exam : The patient is awake and alert, oriented to place and time.    Attention span & concentration ability appears normal.  Speech is fluent,  Nasal- without dysarthria or aphasia.  Mood and affect are appropriate.  Cranial nerves: Pupils are equal and briskly reactive to light. Icteric changes in both sclerea.  Extraocular movements  in vertical and horizontal planes intact and without nystagmus. Visual fields by finger perimetry are intact. Hearing to finger rub intact.  Facial sensation intact to fine touch.Facial motor strength is symmetric and tongue and uvula move midline. Shoulder shrug was symmetrical. Left shoulder ROM restricted.   Motor exam:   Normal tone, muscle bulk and symmetric strength in all extremities. Fine touch, pinprick and vibration were normal.patient reports waking up and his arms are " asleep"    Coordination: Rapid alternating movements in the fingers/hands was normal. Finger-to-nose maneuver  normal without evidence of ataxia, dysmetria or tremor. Gait and station: Patient walks without assistive device and is able unassisted to climb up to the exam table. Deep tendon reflexes: in the  upper and lower extremities are symmetric and intact. Babinski maneuver response is  downgoing.   Assessment:  After physical and neurologic examination, review of laboratory studies, results of polysomnography and EEG / neurophysiology testing and pre-existing records as far as provided in visit., my assessment is   1) OSA confirmed , mild - but thunderous snoring.  Mr. Boomhower needed new supplies for his auto- CPAP machine, 6-12 cm , 95% 11.9 cm water pressure and he is 100% compliant patient.  Residual AHI 0.4 /h.  He requests also a new Quatro size he will need filter, tubing   2) the patient has a history of nocturnal seizures which still occur after he began medication treatment with generic Keppra, namely levetiracetam he has been on this medication for about about 3-1/2 years.  Infrequently to have nocturnal seizures. His wife added that they actually followed a crescendo pattern and have become more frequent this led to an increase in Keppra dosing and he is currently taking 2500 mg daily divided twice daily. He hasn't had any seizures since his new settings were implemented. All seizures were nocturnal. PSG - EEG negative.    The patient was advised of the nature of the diagnosed disorder , the treatment options and the  risks for general health and wellness arising from not treating the condition.   I spent more than 25 minutes of face to face time with the patient.  Greater than 50% of time was spent in counseling and coordination of care. We have discussed the diagnosis and differential and I answered the patient's questions.    Plan:  Treatment plan and additional workup :Nocturnal seizure  disorder requiring a was expanded EEG for seizure montage- and was normal for EEG!!.  Patient will receive new CPAP supplies for his current machine which is about 70-1/54 years old. I will increase the pressure settings to 15 cm water for supine sleep.  I will refill his seizure medication.    Larey Seat, MD 28/76/8115, 72:62 PM  Certified in Neurology by ABPN Certified in [place] by [facility] [address] [place], [place] [address]

## 2017-07-18 ENCOUNTER — Telehealth: Payer: Self-pay | Admitting: Neurology

## 2017-07-18 NOTE — Telephone Encounter (Signed)
Pt's wife called to advise he had a seizure in his sleep last night in his sleep. Please call to advise

## 2017-07-18 NOTE — Telephone Encounter (Signed)
Called the patient's wife. No answer. LVM informing her of what Dr Brett Fairy recommended. I asked if there was anything particularly different with this seizure or if it lasted longer then normal to call back and inform us of any details. At this time Dr Brett Fairy would not feel the need to make changes to the current treatment plan.

## 2017-07-18 NOTE — Telephone Encounter (Signed)
There is not much to do, he may have break through seizures here and there, but Keppra will stay at 2.5 gram per day and CPAP pressure would not have influenced the seizure.  Was the nocturnal seizure different from others? Longer ?

## 2017-08-23 DIAGNOSIS — G4733 Obstructive sleep apnea (adult) (pediatric): Secondary | ICD-10-CM | POA: Diagnosis not present

## 2017-09-23 DIAGNOSIS — G4733 Obstructive sleep apnea (adult) (pediatric): Secondary | ICD-10-CM | POA: Diagnosis not present

## 2017-10-24 DIAGNOSIS — G4733 Obstructive sleep apnea (adult) (pediatric): Secondary | ICD-10-CM | POA: Diagnosis not present

## 2017-11-14 ENCOUNTER — Telehealth: Payer: Self-pay | Admitting: Neurology

## 2017-11-14 NOTE — Telephone Encounter (Signed)
Pt wife(on DPR-[place], tina) has called so Dr Brett Fairy can be aware pt had a seizure last night around 2am, he is fine and home according to wife. Please call

## 2017-11-14 NOTE — Telephone Encounter (Signed)
FYI

## 2017-11-14 NOTE — Telephone Encounter (Signed)
Noted, appreciate the patient's wife informing us. Will also let Dr Brett Fairy know

## 2017-11-25 ENCOUNTER — Telehealth: Payer: Self-pay | Admitting: Neurology

## 2017-11-25 NOTE — Telephone Encounter (Signed)
Pts wife Tina(on DPR)requesting a call stating pts PCP will no longer be seeing pt wanting to know if Dr. Brett Fairy will pick up medications

## 2017-11-26 NOTE — Telephone Encounter (Signed)
Called the patient's wife. She states the PCP is leaving and they will have to find another practice. I asked what medications he was taking cause per our list it was just the [place] and appears that Dr Brett Fairy is already prescribing those. She states that is all he is taking. I advised that Dr Brett Fairy would continue to prescribe those for him. Advised that he would need to still establish primary care with a new PCP for other primary medical needs. Pt's wife verbalized understanding and was appreciative for the call.

## 2017-11-26 NOTE — Telephone Encounter (Signed)
Patient's wife called back and asked if we had recommendations of PCP.informed her that its easier when the patient is in the same [facility] cause we have the capability of viewing their chart in epic. Advised the [place] primary care or any [place] medical group. She was appreciative for the information.

## 2018-01-14 ENCOUNTER — Telehealth: Payer: Self-pay | Admitting: Neurology

## 2018-01-14 NOTE — Telephone Encounter (Signed)
noted 

## 2018-01-14 NOTE — Telephone Encounter (Signed)
Pt wife(on DPR-[place], tina) has called to inform that on 12-6 pt had a seizure close to midnight, wife available if RN needs to call to discuss

## 2018-01-17 DIAGNOSIS — G4733 Obstructive sleep apnea (adult) (pediatric): Secondary | ICD-10-CM | POA: Diagnosis not present

## 2018-02-17 DIAGNOSIS — G4733 Obstructive sleep apnea (adult) (pediatric): Secondary | ICD-10-CM | POA: Diagnosis not present

## 2018-03-03 DIAGNOSIS — M26602 Left temporomandibular joint disorder, unspecified: Secondary | ICD-10-CM | POA: Diagnosis not present

## 2018-03-03 DIAGNOSIS — K2970 Gastritis, unspecified, without bleeding: Secondary | ICD-10-CM | POA: Diagnosis not present

## 2018-03-20 DIAGNOSIS — G4733 Obstructive sleep apnea (adult) (pediatric): Secondary | ICD-10-CM | POA: Diagnosis not present

## 2018-04-14 ENCOUNTER — Telehealth: Payer: Self-pay | Admitting: Neurology

## 2018-04-14 NOTE — Telephone Encounter (Signed)
Pts wife Otila Kluver on [place] called to inform us that the pt had a seizure Friday morning at 1:15am.

## 2018-04-14 NOTE — Telephone Encounter (Signed)
noted 

## 2018-05-15 DIAGNOSIS — M546 Pain in thoracic spine: Secondary | ICD-10-CM | POA: Diagnosis not present

## 2018-05-15 DIAGNOSIS — M545 Low back pain: Secondary | ICD-10-CM | POA: Diagnosis not present

## 2018-06-05 DIAGNOSIS — G4733 Obstructive sleep apnea (adult) (pediatric): Secondary | ICD-10-CM | POA: Diagnosis not present

## 2018-06-16 ENCOUNTER — Telehealth: Payer: Self-pay | Admitting: Neurology

## 2018-06-16 NOTE — Telephone Encounter (Signed)
Pt's wife consented to a Virtual Visit and for the insurance to be billed as such. Pt was informed that they will be billed the copay if any apply.  Pts phone carrier is [employer].

## 2018-06-16 NOTE — Telephone Encounter (Signed)
Pt VV is scheduled 06/23/18 and I have sent the link to the patient's cell phone. Will call closer to apt to make sure the apt it up to date.

## 2018-06-18 NOTE — Telephone Encounter (Signed)
Called the patient to update chart there was no answer. LVM for the pt to call back. Sent the link to cell phone for visit.

## 2018-06-19 ENCOUNTER — Encounter: Payer: Self-pay | Admitting: Neurology

## 2018-06-19 NOTE — Telephone Encounter (Signed)
Called the patient to review their chart and made sure that everything was up to date. Patient informed they received the e-mail/text message for the visit. Instructed to make sure they hold on to the e-mail/text for the upcoming appointment as it is necessary to access their appointment. Instructed the patient that apx 30 min prior to the appointment the front staff will contact them to make sure they are ready to go for their appointment in case there is any need for troubleshooting it can be completed prior to the appointment time. Reminded the patient once more that this is treated as a Office visit and the patient must be prepared for the visit and ready at the time of their appointment preferably in a well lit area where they have good connection for the visit. Pt verbalized understanding.  Pt will complete the visit on phone. Link texted

## 2018-06-23 ENCOUNTER — Ambulatory Visit (INDEPENDENT_AMBULATORY_CARE_PROVIDER_SITE_OTHER): Payer: BLUE CROSS/BLUE SHIELD | Admitting: Neurology

## 2018-06-23 ENCOUNTER — Other Ambulatory Visit: Payer: Self-pay

## 2018-06-23 ENCOUNTER — Encounter: Payer: Self-pay | Admitting: Neurology

## 2018-06-23 DIAGNOSIS — G40909 Epilepsy, unspecified, not intractable, without status epilepticus: Secondary | ICD-10-CM | POA: Insufficient documentation

## 2018-06-23 DIAGNOSIS — Z9989 Dependence on other enabling machines and devices: Secondary | ICD-10-CM

## 2018-06-23 DIAGNOSIS — R569 Unspecified convulsions: Secondary | ICD-10-CM | POA: Insufficient documentation

## 2018-06-23 DIAGNOSIS — G4739 Other sleep apnea: Secondary | ICD-10-CM

## 2018-06-23 DIAGNOSIS — G4733 Obstructive sleep apnea (adult) (pediatric): Secondary | ICD-10-CM

## 2018-06-23 DIAGNOSIS — E669 Obesity, unspecified: Secondary | ICD-10-CM | POA: Diagnosis not present

## 2018-06-23 DIAGNOSIS — G4731 Primary central sleep apnea: Secondary | ICD-10-CM

## 2018-06-23 MED ORDER — LEVETIRACETAM 1000 MG PO TABS: 1000.0000 mg | ORAL_TABLET | Freq: Two times a day (BID) | ORAL | 3 refills | Status: DC

## 2018-06-23 MED ORDER — LEVETIRACETAM 500 MG PO TABS: 500.0000 mg | ORAL_TABLET | Freq: Every day | ORAL | 3 refills | Status: DC

## 2018-06-23 NOTE — Progress Notes (Signed)
Virtual Visit via Video Note  I connected with Vincent David on 06/23/18 at  9:30 AM EDT by a video enabled telemedicine application and verified that I am speaking with the correct person using two identifiers.  Location: Patient: on his patio.  Provider: at [facility]   I discussed the limitations of evaluation and management by telemedicine and the availability of in person appointments. The patient expressed understanding and agreed to proceed.  History of Present Illness: 06-23-2018 HPI:  Vincent David is a 53 y.o. male , seen here  in a referral  from Dr. Marin Comment for evaluation of OSA and nocturnal seizures -  4 phone messages over the last 8 month indicating he seized that night/ 04-14-2018, , 01/14/2018, 10/10/ 2019 , and 07/18/2017. He reports not being as bothered by it as he wouldn't like to increase any medication dose- the side effects of higher medication doses made him nauseated, moody and irritable.     SLEEP MED[facility]INE CLINIC   Provider:  Larey Seat, M D  Primary Care Physician:  Glenford Bayley, DO   Referring Provider: Glenford Bayley, DO    Chief Complaint  Patient presents with   New Patient (Initial Visit)    pt has had seizures about 5 years ago at which time he was worked up for sleep apnea. sleep study was completed in 2015. pt was started on CPAP.           He had a seizure on  11-25-2016 .He is reports that his very first seizure that was noticed at that took place probably in the year 2012 prior to his first sleep evaluation.  He also had a seizure during sleep in January 2013 and was hospitalized EEG in the hospital at the time at [facility] returned negative.  There was no special etiology found when he had yet another nocturnal seizure in July 2013.  He was not on any medications at the time and had not started on antiepileptic medications.  It was noted that the patient was a current smoker, that he drinks 3-4 glasses of wine per week, that he also wakes  up with a dry mouth.  He had no history of documented traumatic brain injury but reports a head injury during [employer] in [place] , in [place]. He was treated for complicated migraines, with a visual aura.   .   No PLM Arousals and he was prescribed a ResMed Quattro FFM in medium size with heated humidifier and an EPR pressure of 3 cm water.    Chief complaint according to patient : " I need CPAP supplies ".  CPAP was downloaded by my staff today and shows 100% compliant patient with an average use at time of 8 hours and 24 minutes, a residual AHI of only 1.2 apneas per hour, a CPAP setting of 11 cmH2O was 3 cm EPR.  Sleep habits are as follows: At about 9 PM, generally asleep rather promptly.  To sleep on his side his bedroom is cool, quiet and dark.  Conducive to sleep.  He uses 2 pillows. He sleeps through the the first 3-4 hours of the night - and wakes up at 4.30 AM, no nocturia. His wife has noted rarely snoring break through on CPAP.  He has a chronically congested nose with a septal deviation, to the left.  The patient is a habitual mouth breather due to his nasal patency. Sleeps about 6-7 hours. He naps on week ends for  1-2 hours. He wakes up generally refreshed and restored.  Sleep medical history and family sleep history:  Nocturnal seizures, his wife usually wakes him. He is confused and very tired. Tongue bite. Urinary incontinence.   Social history: Married.  Active smoker, 1 ppd, ETOH-  2 drinks a day, with dinner- 5 beers when golfing.  caffeine use, 2 coffees in AM- iced tea,decaffeinated. Diet coke 1-2 a day.   06-18-2017, RV after sleep study.  Interval history for Mr. Shivam Mestas, The patient underwent a baseline polysomnography with EEG montage on 07 February 2017, during which mild obstructive sleep apnea was also discovered with an AHI of 13.7/h associated with better loud snoring.  There was no significant oxygen deprivation or retention of CO2 noted.  He also had  a lot of periodic limb movements, was twitching and very restless during the night.  My technologist describes the snoring as thunderous.  He was therefore invited to return for CPAP titration which took place on 29 March 2017.  CPAP was initiated at 5 cm water and advanced to 15 cm water.  The technician also changed to BiPAP to see if higher pressures would be tolerable to the patient and finally to the a as the mode in the same night.  BiPAP with or without ST did not improve the apnea.  Patient did best at a CPAP pressure of 12 cmH2O.  He slept 110 minutes during the titration at this pressure and his oxygen nadir rose to 92%.  Further important is that he had 0 limb movements and that his snoring was still breaking through if he resumed supine sleep.   This is what Mrs. Feuerstein reports here, too. She noted snoring break through under the use of auto CPAP .  The patient has a chronically obstructive nasal passage, she also has facial hair but tolerates a full facemask much better.  I was able today to review his first 90-day compliance and he was 100% compliance with AutoPap between 6 and 12 cmH2O pressure with 3 cm EPR.  The 95th percentile pressure is 11.9 cm, he did not have central apneas and his residual AHI was 0.4.  His Epworth sleepiness score has been reduced to 3 points, and his fatigue at 18 points.    Review of Systems: Out of a complete 14 system review, the patient complains of only the following symptoms, and all other reviewed systems are negative. Seizures at night. Sleep EEG was negative for abnormal discharges.  Shoulder pain , keeping him sleeping supine. Thunderous snoring.     Epworth score  3 , Fatigue severity score 18  , depression score 2/1 5    Social History   Socioeconomic History   Marital status: Married    Spouse name: Not on file   Number of children: Not on file   Years of education: Not on file   Highest education level: Not on file  Social Needs     Financial resource strain: Not on file   Food insecurity - worry: Not on file   Food insecurity - inability: Not on file   Transportation needs - medical: Not on file   Transportation needs - non-medical: Not on file  Occupational History   Not on file  Tobacco Use   Smoking status: Current Every Day Smoker    Packs/day: 1.00   Smokeless tobacco: Never Used  Substance and Sexual Activity   Alcohol use: Yes   Drug use: No   Sexual activity: Not  on file  Other Topics Concern   Not on file  Social History Narrative   Not on file    No family history on file.  Past Medical History:  Diagnosis Date   Postictal headache    Seizure ([place])    Sleep apnea     No past surgical history on file.  Current Outpatient Medications  Medication Sig Dispense Refill   levETIRAcetam (KEPPRA) 1000 MG tablet Take 1,000 mg 2 (two) times daily by mouth.     levETIRAcetam (KEPPRA) 500 MG tablet Take 500 mg at bedtime by mouth.     No current facility-administered medications for this visit.     Allergies as of 12/18/2016   (Not on File)    Vitals: BP (!) 139/91    Pulse 82    Ht 5\' 7"  (1.702 m)    Wt 234 lb (106.1 kg)    BMI 36.65 kg/m  Last Weight:  Wt Readings from Last 1 Encounters:  12/18/16 234 lb (106.1 kg)   MCN:OBSJ mass index is 36.65 kg/m.     Last Height:   Ht Readings from Last 1 Encounters:  12/18/16 5\' 7"  (1.702 m)   Observations/Objective: Physical exam: General: The patient is awake, alert and appears not in acute distress. The patient is well groomed. Head: Normocephalic, atraumatic. Neck is supple. Mallampati 4,  neck circumference:18.5 . Nasal airflow congested ,.  Neurologic exam : The patient is awake and alert, oriented to place and time.    Attention span & concentration ability appears normal.  Speech is fluent,  Nasal- without dysarthria or aphasia.  Mood and affect are appropriate.  Cranial nerves: Pupils are equal and briskly  reactive to light. Facial motor strength is symmetric and tongue and uvula move midline. Shoulder shrug was symmetrical. Left shoulder ROM restricted.   Motor exam:   Normal tone, muscle bulk and symmetric ROM in all extremities. Coordination: Alternating movements  without evidence of ataxia, dysmetria or tremor.  Assessment:  After physical and neurologic examination, review of laboratory studies, results of polysomnography and EEG / neurophysiology testing and pre-existing records as far as provided in visit., my assessment is   1) OSA confirmed , mild - but thunderous snoring.  Mr. Anfinson needed new supplies for his auto- CPAP machine, 6-12 cm , 95% 11.9 cm water pressure and he is a highly compliant patient.   Residual AHI 0.4 /h.  He requests also a new Quatro size he will need filter, tubing   2) the patient has a history of nocturnal seizures which still occur after he began medication treatment with generic Keppra, namely levetiracetam he has been on this medication for about about 4 -1/2 years.  Infrequently to have nocturnal seizures.  His wife added that they actually followed a crescendo pattern and have become more frequent this led to an increase in Keppra dosing and he is currently taking 2500 mg daily divided twice daily. He hasn't had any seizures since his new settings were implemented. All seizures were nocturnal. PSG - EEG negative.      Patient will receive new CPAP supplies for his current machine which is about 55  years old. I will increase the pressure settings to 15 cm water for supine sleep.   He seized at night about every three month/ I will refill his seizure medication. Had tongue bite when waking up.    Follow Up Instructions:Patient will receive new CPAP supplies for his current machine which is about 55  years old. I will increase the pressure settings to 15 cm water for supine sleep.   He seized at night about every three month/ I will refill his seizure  medication. Had tongue bite when waking up but declined changes in medication or dose.      I discussed the assessment and treatment plan with the patient. The patient was provided an opportunity to ask questions and all were answered. The patient agreed with the plan and demonstrated an understanding of the instructions.   The patient was advised to call back or seek an in-person evaluation if the symptoms worsen or if the condition fails to improve as anticipated.  I provided 12 minutes of non-face-to-face time during this encounter.   Larey Seat, MD , [phone]  Certified in Neurology by ABPN Certified in [place] by [facility] [address] [place], [place] [address]

## 2018-06-23 NOTE — Patient Instructions (Signed)
RV in 12 month with NP, alternating every other visit wth MD. Need CPAP compliance data.   Levetiracetam tablets What is this medicine? LEVETIRACETAM (lee ve tye RA se tam) is an antiepileptic drug. It is used with other medicines to treat certain types of seizures. This medicine may be used for other purposes; ask your health care provider or pharmacist if you have questions. COMMON BRAND NAME(S): Keppra, Roweepra What should I tell my health care provider before I take this medicine? They need to know if you have any of these conditions: -kidney disease -suicidal thoughts, plans, or attempt; a previous suicide attempt by you or a family member -an unusual or allergic reaction to levetiracetam, other medicines, foods, dyes, or preservatives -pregnant or trying to get pregnant -breast-feeding How should I use this medicine? Take this medicine by mouth with a glass of water. Follow the directions on the prescription label. Swallow the tablets whole. Do not crush or chew this medicine. You may take this medicine with or without food. Take your doses at regular intervals. Do not take your medicine more often than directed. Do not stop taking this medicine or any of your seizure medicines unless instructed by your doctor or health care professional. Stopping your medicine suddenly can increase your seizures or their severity. A special MedGuide will be given to you by the pharmacist with each prescription and refill. Be sure to read this information carefully each time. Contact your pediatrician or health care professional regarding the use of this medication in children. While this drug may be prescribed for children as young as 61 years of age for selected conditions, precautions do apply. Overdosage: If you think you have taken too much of this medicine contact a poison control center or emergency room at once. NOTE: This medicine is only for you. Do not share this medicine with others. What if I  miss a dose? If you miss a dose, take it as soon as you can. If it is almost time for your next dose, take only that dose. Do not take double or extra doses. What may interact with this medicine? This medicine may interact with the following medications: -carbamazepine -colesevelam -probenecid -sevelamer This list may not describe all possible interactions. Give your health care provider a list of all the medicines, herbs, non-prescription drugs, or dietary supplements you use. Also tell them if you smoke, drink alcohol, or use illegal drugs. Some items may interact with your medicine. What should I watch for while using this medicine? Visit your doctor or health care professional for a regular check on your progress. Wear a medical identification bracelet or chain to say you have epilepsy, and carry a card that lists all your medications. It is important to take this medicine exactly as instructed by your health care professional. When first starting treatment, your dose may need to be adjusted. It may take weeks or months before your dose is stable. You should contact your doctor or health care professional if your seizures get worse or if you have any new types of seizures. You may get drowsy or dizzy. Do not drive, use machinery, or do anything that needs mental alertness until you know how this medicine affects you. Do not stand or sit up quickly, especially if you are an older patient. This reduces the risk of dizzy or fainting spells. Alcohol may interfere with the effect of this medicine. Avoid alcoholic drinks. The use of this medicine may increase the chance of suicidal thoughts or  actions. Pay special attention to how you are responding while on this medicine. Any worsening of mood, or thoughts of suicide or dying should be reported to your health care professional right away. Women who become pregnant while using this medicine may enroll in the [place] Pregnancy  Registry by calling [phone]. This registry collects information about the safety of antiepileptic drug use during pregnancy. What side effects may I notice from receiving this medicine? Side effects that you should report to your doctor or health care professional as soon as possible: -allergic reactions like skin rash, itching or hives, swelling of the face, lips, or tongue -breathing problems -dark urine -general ill feeling or flu-like symptoms -problems with balance, talking, walking -unusually weak or tired -worsening of mood, thoughts or actions of suicide or dying -yellowing of the eyes or skin Side effects that usually do not require medical attention (report to your doctor or health care professional if they continue or are bothersome): -diarrhea -dizzy, drowsy -headache -loss of appetite This list may not describe all possible side effects. Call your doctor for medical advice about side effects. You may report side effects to FDA at 1-800-FDA-1088. Where should I keep my medicine? Keep out of reach of children. Store at room temperature between 15 and 30 degrees C (59 and 86 degrees F). Throw away any unused medicine after the expiration date. NOTE: This sheet is a summary. It may not cover all possible information. If you have questions about this medicine, talk to your doctor, pharmacist, or health care provider.  2019 Elsevier/Gold Standard (2015-02-24 09:43:54)

## 2018-08-07 ENCOUNTER — Telehealth: Payer: Self-pay | Admitting: Neurology

## 2018-08-07 NOTE — Telephone Encounter (Signed)
pts wife Otila Kluver called in and stated he had a seizure around 3am this morning

## 2018-08-07 NOTE — Telephone Encounter (Signed)
noted 

## 2018-09-05 DIAGNOSIS — G4733 Obstructive sleep apnea (adult) (pediatric): Secondary | ICD-10-CM | POA: Diagnosis not present

## 2018-12-08 DIAGNOSIS — G4733 Obstructive sleep apnea (adult) (pediatric): Secondary | ICD-10-CM | POA: Diagnosis not present

## 2018-12-15 NOTE — Telephone Encounter (Signed)
Noted thanks °

## 2018-12-15 NOTE — Telephone Encounter (Signed)
FYI-Pt's wife called again to report another seizure that happened Friday night at 10:30pm November 6th.

## 2019-01-15 ENCOUNTER — Encounter: Payer: Self-pay | Admitting: Osteopathic Medicine

## 2019-01-15 ENCOUNTER — Ambulatory Visit (INDEPENDENT_AMBULATORY_CARE_PROVIDER_SITE_OTHER): Payer: BC Managed Care – PPO | Admitting: Osteopathic Medicine

## 2019-01-15 VITALS — Temp 98.7°F | Ht 67.0 in | Wt 232.0 lb

## 2019-01-15 DIAGNOSIS — R569 Unspecified convulsions: Secondary | ICD-10-CM

## 2019-01-15 DIAGNOSIS — Z13220 Encounter for screening for lipoid disorders: Secondary | ICD-10-CM

## 2019-01-15 DIAGNOSIS — G40909 Epilepsy, unspecified, not intractable, without status epilepticus: Secondary | ICD-10-CM

## 2019-01-15 DIAGNOSIS — Z0000 Encounter for general adult medical examination without abnormal findings: Secondary | ICD-10-CM | POA: Diagnosis not present

## 2019-01-15 DIAGNOSIS — Z9989 Dependence on other enabling machines and devices: Secondary | ICD-10-CM

## 2019-01-15 DIAGNOSIS — Z125 Encounter for screening for malignant neoplasm of prostate: Secondary | ICD-10-CM | POA: Diagnosis not present

## 2019-01-15 DIAGNOSIS — Z1211 Encounter for screening for malignant neoplasm of colon: Secondary | ICD-10-CM

## 2019-01-15 DIAGNOSIS — G4733 Obstructive sleep apnea (adult) (pediatric): Secondary | ICD-10-CM

## 2019-01-15 NOTE — Progress Notes (Signed)
Virtual Visit via Phone   I connected with      [name] on 01/15/19 at 2:31 PM  by a telemedicine application and verified that I am speaking with the correct person using two identifiers.  Patient is at home I am in office   I discussed the limitations of evaluation and management by telemedicine and the availability of in person appointments. The patient expressed understanding and agreed to proceed.  History of Present Illness: Vincent David is a 53 y.o. male who would like to discuss Annual physical, new to establish    Smoker x30+ years <1 pack per day Reports anxiety likely d/t his medications    Depression screen [facility] 2/9 01/15/2019  Decreased Interest 1  Down, Depressed, Hopeless 0  PHQ - 2 Score 1   GAD 7 : Generalized Anxiety Score 01/15/2019  Nervous, Anxious, on Edge 2  Control/stop worrying 2  Worry too much - different things 2  Trouble relaxing 2  Restless 2  Easily annoyed or irritable 3  Afraid - awful might happen 0  Total GAD 7 Score 13  Anxiety Difficulty Very difficult       Observations/Objective: Temp 98.7 F (37.1 C) (Oral)   Ht 5\' 7"  (1.702 m)   Wt 232 lb (105.2 kg)   BMI 36.34 kg/m  BP Readings from Last 3 Encounters:  06/18/17 130/78  12/18/16 (!) 139/91   Exam: Normal Speech.  NAD  Lab and Radiology Results No results found for this or any previous visit (from the past 72 hour(s)). No results found.     Assessment and Plan: 53 y.o. male with The primary encounter diagnosis was Annual physical exam. Diagnoses of Prostate cancer screening, Lipid screening, Nocturnal seizures ([place]), OSA on CPAP, and Colon cancer screening were also pertinent to this visit.   PDMP not reviewed this encounter. Orders Placed This Encounter  Procedures  . CBC  . LIPID SCREENING  . COMPLETE METABOLIC PANEL WITH GFR  . PSA SOLSTAS  . Cologuard   No orders of the defined types were placed in this encounter.  Patient Instructions   General Preventive Care  Most recent routine screening lipids/other labs: ordered today!   Everyone should have blood pressure checked once per year. Goal 130/80 or less.   Tobacco: don't! Please let me know if you need help quitting!  Alcohol: responsible moderation is ok for most adults - if you have concerns about your alcohol intake, please talk to me!   Exercise: as tolerated to reduce risk of cardiovascular disease and diabetes. Strength training will also prevent osteoporosis.   Mental health: if need for mental health care (medicines, counseling, other), or concerns about moods, please let me know!   Sexual health: if need for STD testing, or if concerns with libido/pain problems, please let me know!   Advanced Directive: Living Will and/or Healthcare Power of Attorney recommended for all adults, regardless of age or health.  Vaccines  Flu vaccine: recommended for almost everyone, every fall.   Shingles vaccine: Shingrix recommended after age 50.   Pneumonia vaccines: Prevnar and Pneumovax recommended after age 33, or sooner if certain medical conditions/smokers   Tetanus booster: Tdap recommended every 10 years.   COVID vaccine: Currently there is not a firm plan for when this will be available to the public, or how/where it will be administered once it is available. Our clinic will probably be sending letters/MyChart messages to patients once we have more information/instructions.  Cancer screenings  Colon cancer screening: recommended for everyone at age 67, but some folks need a colonoscopy sooner if risk factors   Prostate cancer screening: PSA blood test around age 73  Lung cancer screening: CT chest every year for those age 58 to 55 years old with ?30 pack year smoking history, who either currently smoke or have quit within the past 15 years. Infection screenings . HIV: recommended screening at least once age 39-65, more often as needed. . Gonorrhea/Chlamydia:  screening as needed . Hepatitis C: recommended once for anyone born 71-1967 . TB: certain at-risk populations, or depending on work requirements and/or travel history Other . Bone Density Test: recommended for men at age 53, sooner depending on risk factors . Abdominal Aortic Aneurysm: screening with ultrasound recommended once for men age 44-75 who have ever smoked            Follow Up Instructions: Return for RECHECK PENDING LAB RESULTS / IF WORSE OR CHANGE.    I discussed the assessment and treatment plan with the patient. The patient was provided an opportunity to ask questions and all were answered. The patient agreed with the plan and demonstrated an understanding of the instructions.   The patient was advised to call back or seek an in-person evaluation if any new concerns, if symptoms worsen or if the condition fails to improve as anticipated.  30 minutes of non-face-to-face time was provided during this encounter.      . . . . . . . . . . . . . [name]                   Historical information moved to improve visibility of documentation.  Past Medical History:  Diagnosis Date  . Postictal headache   . Seizure ([place])   . Sleep apnea   . Tinnitus    Past Surgical History:  Procedure Laterality Date  . KNEE SURGERY Left   . SHOULDER SURGERY Left    Social History   Tobacco Use  . Smoking status: Current Every Day Smoker    Packs/day: 0.50    Types: Cigarettes  . Smokeless tobacco: Never Used  Substance Use Topics  . Alcohol use: Yes    Alcohol/week: 4.0 standard drinks    Types: 4 Standard drinks or equivalent per week   family history includes Diabetes in his mother; High blood pressure in his father; Sleep apnea in his mother.  Medications: Current Outpatient Medications  Medication Sig Dispense Refill  . levETIRAcetam (KEPPRA) 1000 MG tablet Take 1 tablet (1,000 mg total) by mouth 2 (two) times daily. 180 tablet 3  .  levETIRAcetam (KEPPRA) 500 MG tablet Take 1 tablet (500 mg total) by mouth at bedtime. 90 tablet 3   No current facility-administered medications for this visit.   No Known Allergies

## 2019-01-15 NOTE — Patient Instructions (Addendum)
General Preventive Care  Most recent routine screening lipids/other labs: ordered today!   Everyone should have blood pressure checked once per year. Goal 130/80 or less.   Tobacco: don't! Please let me know if you need help quitting!  Alcohol: responsible moderation is ok for most adults - if you have concerns about your alcohol intake, please talk to me!   Exercise: as tolerated to reduce risk of cardiovascular disease and diabetes. Strength training will also prevent osteoporosis.   Mental health: if need for mental health care (medicines, counseling, other), or concerns about moods, please let me know!   Sexual health: if need for STD testing, or if concerns with libido/pain problems, please let me know!   Advanced Directive: Living Will and/or Healthcare Power of Attorney recommended for all adults, regardless of age or health.  Vaccines  Flu vaccine: recommended for almost everyone, every fall.   Shingles vaccine: Shingrix recommended after age 60.   Pneumonia vaccines: Prevnar and Pneumovax recommended after age 14, or sooner if certain medical conditions/smokers   Tetanus booster: Tdap recommended every 10 years.   COVID vaccine: Currently there is not a firm plan for when this will be available to the public, or how/where it will be administered once it is available. Our clinic will probably be sending letters/MyChart messages to patients once we have more information/instructions.  Cancer screenings   Colon cancer screening: recommended for everyone at age 73, but some folks need a colonoscopy sooner if risk factors   Prostate cancer screening: PSA blood test around age 56  Lung cancer screening: CT chest every year for those age 17 to 55 years old with ?30 pack year smoking history, who either currently smoke or have quit within the past 15 years. Infection screenings . HIV: recommended screening at least once age 50-65, more often as needed. . Gonorrhea/Chlamydia:  screening as needed . Hepatitis C: recommended once for anyone born 19-1967 . TB: certain at-risk populations, or depending on work requirements and/or travel history Other . Bone Density Test: recommended for men at age 67, sooner depending on risk factors . Abdominal Aortic Aneurysm: screening with ultrasound recommended once for men age 1-75 who have ever smoked

## 2019-02-05 LAB — CBC
HCT: 48.7 % (ref 38.5–50.0)
Hemoglobin: 16.8 g/dL (ref 13.2–17.1)
MCH: 31.1 pg (ref 27.0–33.0)
MCHC: 34.5 g/dL (ref 32.0–36.0)
MCV: 90 fL (ref 80.0–100.0)
MPV: 9.6 fL (ref 7.5–12.5)
Platelets: 226 10*3/uL (ref 140–400)
RBC: 5.41 10*6/uL (ref 4.20–5.80)
RDW: 12.4 % (ref 11.0–15.0)
WBC: 10.9 10*3/uL — ABNORMAL HIGH (ref 3.8–10.8)

## 2019-02-05 LAB — COMPLETE METABOLIC PANEL WITH GFR
AG Ratio: 2 (calc) (ref 1.0–2.5)
ALT: 22 U/L (ref 9–46)
AST: 18 U/L (ref 10–35)
Albumin: 4.5 g/dL (ref 3.6–5.1)
Alkaline phosphatase (APISO): 106 U/L (ref 35–144)
BUN: 12 mg/dL (ref 7–25)
CO2: 26 mmol/L (ref 20–32)
Calcium: 9.3 mg/dL (ref 8.6–10.3)
Chloride: 101 mmol/L (ref 98–110)
Creat: 1.15 mg/dL (ref 0.70–1.33)
GFR, Est African American: 83 mL/min/{1.73_m2} (ref >=60)
GFR, Est Non African American: 71 mL/min/{1.73_m2} (ref >=60)
Globulin: 2.3 g/dL (calc) (ref 1.9–3.7)
Glucose, Bld: 88 mg/dL (ref 65–99)
Potassium: 4.6 mmol/L (ref 3.5–5.3)
Sodium: 138 mmol/L (ref 135–146)
Total Bilirubin: 0.5 mg/dL (ref 0.2–1.2)
Total Protein: 6.8 g/dL (ref 6.1–8.1)

## 2019-02-05 LAB — LIPID PANEL
Cholesterol: 206 mg/dL — ABNORMAL HIGH (ref <200)
HDL: 34 mg/dL — ABNORMAL LOW (ref >=40)
LDL Cholesterol (Calc): 146 mg/dL (calc) — ABNORMAL HIGH
Non-HDL Cholesterol (Calc): 172 mg/dL (calc) — ABNORMAL HIGH (ref <130)
Total CHOL/HDL Ratio: 6.1 (calc) — ABNORMAL HIGH (ref <5.0)
Triglycerides: 133 mg/dL (ref <150)

## 2019-02-05 LAB — PSA, TOTAL WITH REFLEX TO PSA, FREE: PSA, Total: 0.7 ng/mL (ref <=4.0)

## 2019-02-10 ENCOUNTER — Encounter: Payer: Self-pay | Admitting: Physician Assistant

## 2019-02-10 DIAGNOSIS — E782 Mixed hyperlipidemia: Secondary | ICD-10-CM | POA: Insufficient documentation

## 2019-02-17 ENCOUNTER — Encounter: Payer: Self-pay | Admitting: Neurology

## 2019-02-18 ENCOUNTER — Encounter: Payer: Self-pay | Admitting: Neurology

## 2019-02-19 ENCOUNTER — Ambulatory Visit (INDEPENDENT_AMBULATORY_CARE_PROVIDER_SITE_OTHER): Payer: BC Managed Care – PPO | Admitting: Adult Health

## 2019-02-19 ENCOUNTER — Encounter: Payer: Self-pay | Admitting: Adult Health

## 2019-02-19 ENCOUNTER — Other Ambulatory Visit: Payer: Self-pay

## 2019-02-19 VITALS — BP 142/68 | HR 91 | Temp 97.5°F | Ht 67.0 in | Wt 239.4 lb

## 2019-02-19 DIAGNOSIS — H538 Other visual disturbances: Secondary | ICD-10-CM

## 2019-02-19 DIAGNOSIS — G43709 Chronic migraine without aura, not intractable, without status migrainosus: Secondary | ICD-10-CM

## 2019-02-19 DIAGNOSIS — H547 Unspecified visual loss: Secondary | ICD-10-CM | POA: Diagnosis not present

## 2019-02-19 NOTE — Patient Instructions (Signed)
Your Plan:  Continue Keppra MRI brain  If your symptoms worsen or you develop new symptoms please let us know.    Thank you for coming to see [place] at [facility]. I hope we have been able to provide you high quality care today.  You may receive a patient satisfaction survey over the next few weeks. We would appreciate your feedback and comments so that we may continue to improve ourselves and the health of our patients.

## 2019-02-19 NOTE — Progress Notes (Signed)
PATIENT: Vincent David DOB: [birth year]  REASON FOR VISIT: follow up HISTORY FROM: patient  HISTORY OF PRESENT ILLNESS: Today 02/19/19:  Vincent David is a 54 year old male with a history of obstructive sleep apnea on CPAP and nocturnal seizures.  His CPAP download shows that he uses machine nightly for compliance of 100%.  He uses machine greater than 4 hours each night.  On average he uses his machine 9 hours and 29 minutes.  His residual AHI is 0.3 on 6 to 15 cm of water with EPR 3.  He reports that the CPAP is working well.  He states that he has approximately 3-5 seizures each year.  He is currently on Keppra taking at 1000 mg in the morning and 1500 mg in the evening.  He does not wish to increase or change his medication at this time.  The patient has new complaints today.  He states that he has always had migraine headaches since he had a head injury when he was younger.  He reports that he has 3-4 headaches a month.  During his headache he will experience blurry vision.  Photophobia and phonophobia.  He states that he typically can take Tylenol and rest and the headache will resolve.  At most the headache may last a full day.  He states that in the last 2 to 3 months he has noticed visual changes in both eyes but not occurring simultaneously.  He states that typically occurs in the right eye.  He states that he can see light but no definition of objects.  Reports that he will not be able to see my hand if I held up in front of his face.  Reports that it lasts approximately 30 seconds.  At this time he does not feel that it coincides with a headache.  He did see his primary care but they recommended that he follow-up with our office.  He has not seen ophthalmology.  He denies any other symptoms with these visual changes.  He returns today for an evaluation.    REVIEW OF SYSTEMS: Out of a complete 14 system review of symptoms, the patient complains only of the following symptoms, and all other  reviewed systems are negative.  See HPI  ALLERGIES: No Known Allergies  HOME MEDICATIONS: Outpatient Medications Prior to Visit  Medication Sig Dispense Refill  . levETIRAcetam (KEPPRA) 1000 MG tablet Take 1 tablet (1,000 mg total) by mouth 2 (two) times daily. 180 tablet 3  . levETIRAcetam (KEPPRA) 500 MG tablet Take 1 tablet (500 mg total) by mouth at bedtime. 90 tablet 3   No facility-administered medications prior to visit.    PAST MEDICAL HISTORY: Past Medical History:  Diagnosis Date  . Postictal headache   . Seizure ([place])   . Sleep apnea   . Tinnitus     PAST SURGICAL HISTORY: Past Surgical History:  Procedure Laterality Date  . KNEE SURGERY Left   . SHOULDER SURGERY Left     FAMILY HISTORY: Family History  Problem Relation Age of Onset  . Sleep apnea Mother   . Diabetes Mother   . High blood pressure Father     SOCIAL HISTORY: Social History   Socioeconomic History  . Marital status: Married    Spouse name: Otila Kluver  . Number of children: 3  . Years of education: Not on file  . Highest education level: Not on file  Occupational History    Employer: OTHER  Tobacco Use  . Smoking status: Current  Every Day Smoker    Packs/day: 0.50    Types: Cigarettes  . Smokeless tobacco: Never Used  Substance and Sexual Activity  . Alcohol use: Yes    Alcohol/week: 4.0 standard drinks    Types: 4 Standard drinks or equivalent per week  . Drug use: Never  . Sexual activity: Yes    Partners: Female  Other Topics Concern  . Not on file  Social History Narrative  . Not on file   Social Determinants of Health   Financial Resource Strain:   . Difficulty of Paying Living Expenses: Not on file  Food Insecurity:   . Worried About Charity fundraiser in the Last Year: Not on file  . Ran Out of Food in the Last Year: Not on file  Transportation Needs:   . Lack of Transportation (Medical): Not on file  . Lack of Transportation (Non-Medical): Not on file  Physical  Activity:   . Days of Exercise per Week: Not on file  . Minutes of Exercise per Session: Not on file  Stress:   . Feeling of Stress : Not on file  Social Connections:   . Frequency of Communication with Friends and Family: Not on file  . Frequency of Social Gatherings with Friends and Family: Not on file  . Attends Religious Services: Not on file  . Active Member of Clubs or Organizations: Not on file  . Attends Archivist Meetings: Not on file  . Marital Status: Not on file  Intimate Partner Violence:   . Fear of Current or Ex-Partner: Not on file  . Emotionally Abused: Not on file  . Physically Abused: Not on file  . Sexually Abused: Not on file      PHYSICAL EXAM  Vitals:   02/19/19 1419  BP: (!) 142/68  Pulse: 91  Temp: (!) 97.5 F (36.4 C)  TempSrc: Oral  Weight: 239 lb 6.4 oz (108.6 kg)  Height: 5\' 7"  (1.702 m)   Body mass index is 37.5 kg/m.  Generalized: Well developed, in no acute distress   Neurological examination  Mentation: Alert oriented to time, place, history taking. Follows all commands speech and language fluent Cranial nerve II-XII: Pupils were equal round reactive to light. Extraocular movements were full, visual field were full on confrontational test. Facial sensation and strength were normal. Uvula tongue midline. Head turning and shoulder shrug  were normal and symmetric. Motor: The motor testing reveals 5 over 5 strength of all 4 extremities. Good symmetric motor tone is noted throughout.  Sensory: Sensory testing is intact to soft touch on all 4 extremities. No evidence of extinction is noted.  Coordination: Cerebellar testing reveals good finger-nose-finger and heel-to-shin bilaterally.  Gait and station: Gait is normal.  Reflexes: Deep tendon reflexes are symmetric and normal bilaterally.   DIAGNOSTIC DATA (LABS, IMAGING, TESTING) - I reviewed patient records, labs, notes, testing and imaging myself where available.  Lab Results    Component Value Date   WBC 10.9 (H) 02/04/2019   HGB 16.8 02/04/2019   HCT 48.7 02/04/2019   MCV 90.0 02/04/2019   PLT 226 02/04/2019      Component Value Date/Time   NA 138 02/04/2019 1627   K 4.6 02/04/2019 1627   CL 101 02/04/2019 1627   CO2 26 02/04/2019 1627   GLUCOSE 88 02/04/2019 1627   BUN 12 02/04/2019 1627   CREATININE 1.15 02/04/2019 1627   CALCIUM 9.3 02/04/2019 1627   PROT 6.8 02/04/2019 1627   AST  18 02/04/2019 1627   ALT 22 02/04/2019 1627   BILITOT 0.5 02/04/2019 1627   GFRNONAA 71 02/04/2019 1627   GFRAA 83 02/04/2019 1627   Lab Results  Component Value Date   CHOL 206 (H) 02/04/2019   HDL 34 (L) 02/04/2019   LDLCALC 146 (H) 02/04/2019   TRIG 133 02/04/2019   CHOLHDL 6.1 (H) 02/04/2019   No results found for: HGBA1C No results found for: VITAMINB12 No results found for: TSH    ASSESSMENT AND PLAN 54 y.o. year old male  has a past medical history of Postictal headache, Seizure ([place]), Sleep apnea, and Tinnitus. here with:  1.  Visual disturbance  -Physical exam relatively unremarkable. -MRI of the brain with and without contrast  2.  Migraine headaches  -Discussed medication options the patient deferred -Completing MRI of the brain with and without contrast  3.  Obstructive sleep apnea on CPAP  -Good compliance -Encouraged the patient to continue using CPAP nightly and greater than 4 hours each night  4.  Nocturnal seizures  -Continue Keppra 1000 mg in the morning and 1500 mg in the evening -Discussed increasing dose however the patient deferred.  The patient will follow up in 3 to 4 months.  Once we have the MRI results I will call with further plan of care.   I spent 15 minutes with the patient. 50% of this time was spent   [name], MSN, NP-C 02/19/2019, 3:00 PM [facility] [address] [place], [place] [address] [phone]

## 2019-03-04 ENCOUNTER — Encounter: Payer: Self-pay | Admitting: Adult Health

## 2019-03-10 ENCOUNTER — Ambulatory Visit: Payer: BC Managed Care – PPO

## 2019-03-10 ENCOUNTER — Other Ambulatory Visit: Payer: Self-pay

## 2019-03-10 DIAGNOSIS — G43709 Chronic migraine without aura, not intractable, without status migrainosus: Secondary | ICD-10-CM | POA: Diagnosis not present

## 2019-03-10 DIAGNOSIS — H538 Other visual disturbances: Secondary | ICD-10-CM | POA: Diagnosis not present

## 2019-03-10 DIAGNOSIS — H547 Unspecified visual loss: Secondary | ICD-10-CM | POA: Diagnosis not present

## 2019-03-10 MED ORDER — GADOBENATE DIMEGLUMINE 529 MG/ML IV SOLN
20.0000 mL | Freq: Once | INTRAVENOUS | Status: AC | PRN
  Administered 2019-03-10: 10:00:00 20 mL via INTRAVENOUS

## 2019-03-11 DIAGNOSIS — G4733 Obstructive sleep apnea (adult) (pediatric): Secondary | ICD-10-CM | POA: Diagnosis not present

## 2019-03-17 ENCOUNTER — Telehealth: Payer: Self-pay

## 2019-03-17 NOTE — Telephone Encounter (Signed)
Called pt and was able to speak with him directly. Pt demonstrated understanding and had no questions nor concerns.   "MRI brain is normal " NP Megan Millikan  "FINDINGS:   No abnormal lesions are seen on diffusion-weighted views to suggest acute ischemia. The cortical sulci, fissures and cisterns are normal in size and appearance. Lateral, third and fourth ventricle are normal in size and appearance. No extra-axial fluid collections are seen. No evidence of mass effect or midline shift.  No abnormal lesions are seen on post contrast views.    On sagittal views the posterior fossa, pituitary gland and corpus callosum are unremarkable. No evidence of intracranial hemorrhage on gradient-echo views. The orbits and their contents, paranasal sinuses and calvarium are unremarkable.  Intracranial flow voids are present." -Dr. Leta Baptist

## 2019-04-06 ENCOUNTER — Ambulatory Visit (INDEPENDENT_AMBULATORY_CARE_PROVIDER_SITE_OTHER): Payer: BC Managed Care – PPO | Admitting: Sports Medicine

## 2019-04-06 ENCOUNTER — Other Ambulatory Visit: Payer: Self-pay

## 2019-04-06 ENCOUNTER — Ambulatory Visit (INDEPENDENT_AMBULATORY_CARE_PROVIDER_SITE_OTHER): Payer: BC Managed Care – PPO

## 2019-04-06 ENCOUNTER — Encounter: Payer: Self-pay | Admitting: Sports Medicine

## 2019-04-06 DIAGNOSIS — M25551 Pain in right hip: Secondary | ICD-10-CM

## 2019-04-06 DIAGNOSIS — M1611 Unilateral primary osteoarthritis, right hip: Secondary | ICD-10-CM | POA: Diagnosis not present

## 2019-04-06 DIAGNOSIS — M545 Low back pain: Secondary | ICD-10-CM | POA: Diagnosis not present

## 2019-04-06 MED ORDER — MELOXICAM 15 MG PO TABS: ORAL_TABLET | ORAL | 3 refills | Status: DC

## 2019-04-06 MED ORDER — PREDNISONE 50 MG PO TABS: ORAL_TABLET | ORAL | 0 refills | Status: DC

## 2019-04-06 NOTE — Assessment & Plan Note (Signed)
This is a pleasant 54 year old male with a history of generalized tonic-clonic seizure disorder. For several months now he is had pain that he localizes in the right side of his low back radiating around to the right groin.  Worse with prolonged weightbearing. He does not really have any pain reproducible on exam with regards to his back but I am able to reproduce his pain with internal rotation of his right hip, this suggests his hip joint as the pain generator. Adding 5 days of prednisone followed by meloxicam. X-rays of the hip and lumbar spine, rehab exercises for hip osteoarthritis and herniated disc. Return to see me in 4 to 6 weeks, if the pain seems to declare itself is originating from the hip joint we will inject his hip joint at the next visit, if it declares itself is coming from the lumbar spine we will obtain a lumbar spine MRI for epidural planning at the next visit.

## 2019-04-06 NOTE — Progress Notes (Signed)
    Procedures performed today:    None.  Independent interpretation of notes and tests performed by another provider:   None.  Impression and Recommendations:    Chronic right hip and back pain This is a pleasant 54 year old male with a history of generalized tonic-clonic seizure disorder. For several months now he is had pain that he localizes in the right side of his low back radiating around to the right groin.  Worse with prolonged weightbearing. He does not really have any pain reproducible on exam with regards to his back but I am able to reproduce his pain with internal rotation of his right hip, this suggests his hip joint as the pain generator. Adding 5 days of prednisone followed by meloxicam. X-rays of the hip and lumbar spine, rehab exercises for hip osteoarthritis and herniated disc. Return to see me in 4 to 6 weeks, if the pain seems to declare itself is originating from the hip joint we will inject his hip joint at the next visit, if it declares itself is coming from the lumbar spine we will obtain a lumbar spine MRI for epidural planning at the next visit.    ___________________________________________ Gwen Her. Dianah Field, M.D., ABFM., CAQSM. Primary Care and [place] Instructor of [place] [facility]

## 2019-04-07 ENCOUNTER — Encounter: Payer: Self-pay | Admitting: Osteopathic Medicine

## 2019-04-07 DIAGNOSIS — Z136 Encounter for screening for cardiovascular disorders: Secondary | ICD-10-CM

## 2019-04-08 ENCOUNTER — Other Ambulatory Visit: Payer: Self-pay

## 2019-04-08 ENCOUNTER — Ambulatory Visit (INDEPENDENT_AMBULATORY_CARE_PROVIDER_SITE_OTHER): Payer: BC Managed Care – PPO

## 2019-04-08 DIAGNOSIS — Z136 Encounter for screening for cardiovascular disorders: Secondary | ICD-10-CM | POA: Diagnosis not present

## 2019-04-08 DIAGNOSIS — I77811 Abdominal aortic ectasia: Secondary | ICD-10-CM | POA: Diagnosis not present

## 2019-04-09 ENCOUNTER — Encounter: Payer: Self-pay | Admitting: Osteopathic Medicine

## 2019-04-09 DIAGNOSIS — I77811 Abdominal aortic ectasia: Secondary | ICD-10-CM | POA: Insufficient documentation

## 2019-04-30 ENCOUNTER — Telehealth: Payer: Self-pay | Admitting: Adult Health

## 2019-04-30 NOTE — Telephone Encounter (Signed)
Pt wife called to inform patient had a seizure on Monday around 1pm

## 2019-05-04 MED ORDER — LEVETIRACETAM 500 MG PO TABS: 1500.0000 mg | ORAL_TABLET | Freq: Two times a day (BID) | ORAL | 3 refills | Status: DC

## 2019-05-04 NOTE — Telephone Encounter (Signed)
I called and spoke to wife she stated that pt had gmal seizure last Monday 04-27-19.  Pt has been compliant with keprra 1000mg  po am, 1500mg  po PM.  No triggers that can think of.  No illness, sleep normal.  Did relay afterwards to wife that had blurry vision 2 days prior to sz.  Duration no sure, when she got there approx 1 min or slight less.  Previsous sz was 12-12-18.  This is FYI, unless you want to more.

## 2019-05-04 NOTE — Telephone Encounter (Signed)
Spoke to wife and relayed that per MMNP recommended increasing keppra dose to 1500mg  po BID, Wife would like one prescription.  Done. (90 day supply).

## 2019-05-04 NOTE — Telephone Encounter (Signed)
I would recommend that we increase his Keppra dose to 1500 mg twice a day.  If the patient is amendable I can send a new prescription in.  In the past he has not wanted to adjust his medication.

## 2019-05-20 ENCOUNTER — Other Ambulatory Visit: Payer: Self-pay

## 2019-05-20 ENCOUNTER — Encounter: Payer: Self-pay | Admitting: Neurology

## 2019-05-20 ENCOUNTER — Ambulatory Visit: Payer: BC Managed Care – PPO | Admitting: Sports Medicine

## 2019-05-20 ENCOUNTER — Ambulatory Visit (INDEPENDENT_AMBULATORY_CARE_PROVIDER_SITE_OTHER): Payer: BC Managed Care – PPO | Admitting: Neurology

## 2019-05-20 VITALS — BP 159/92 | HR 89 | Temp 97.7°F | Ht 67.0 in | Wt 234.0 lb

## 2019-05-20 DIAGNOSIS — G4733 Obstructive sleep apnea (adult) (pediatric): Secondary | ICD-10-CM

## 2019-05-20 DIAGNOSIS — G40909 Epilepsy, unspecified, not intractable, without status epilepticus: Secondary | ICD-10-CM

## 2019-05-20 DIAGNOSIS — G43709 Chronic migraine without aura, not intractable, without status migrainosus: Secondary | ICD-10-CM | POA: Diagnosis not present

## 2019-05-20 DIAGNOSIS — H538 Other visual disturbances: Secondary | ICD-10-CM

## 2019-05-20 DIAGNOSIS — Z9989 Dependence on other enabling machines and devices: Secondary | ICD-10-CM

## 2019-05-20 DIAGNOSIS — E669 Obesity, unspecified: Secondary | ICD-10-CM | POA: Diagnosis not present

## 2019-05-20 DIAGNOSIS — G4739 Other sleep apnea: Secondary | ICD-10-CM

## 2019-05-20 DIAGNOSIS — R569 Unspecified convulsions: Secondary | ICD-10-CM

## 2019-05-20 NOTE — Progress Notes (Signed)
SLEEP MEDICINE CLINIC    Provider:  Larey Seat, MD  Primary Care Physician:  Emeterio Reeve, [facility] [address] [place] [address]     Referring Provider: Laretta Alstrom [place],  [place] [address]          Chief Complaint according to patient   Patient presents with:    .      Visual blurring as a prodrome to seizures? Nocturnal seizure?       HISTORY OF PRESENT ILLNESS:  Vincent David is a 54 y.o. year old White or Caucasian male patient is seen here  on 05/20/2019 .    I have the pleasure of seeing Vincent David today, a *right -handed White or Caucasian male with a possible sleep disorder. He  has a past medical history of Postictal headache, Seizure ([place]), Sleep apnea, and Tinnitus. HTN, nocturnal injuries with seizures.    The patient had a seizure history - blurred vision seems to be a prodrome- days and hours before a seizure follows.  I have seen Mr Vincent David in 06-23-2018 in a video consultation, he has since seen [name] on 02-09-2019.  History of Present Illness: 06-23-2018 HPI:  Vincent David is a 54 y.o. male , seen here  in a referral  from Dr. Marin Comment for evaluation of OSA and nocturnal seizures -  4 phone messages over the last 8 month indicating he seized that night/ 04-14-2018, , 01/14/2018, 10/10/ 2019 , and 07/18/2017. He reports not being as bothered by it as he wouldn't like to increase any medication dose- the side effects of higher medication doses made him nauseated, moody and irritable.   He reports being in pain from injuries he has suffered in nightly seizure.  This is 2 years had called the office and informed us that the patient had another nocturnal seizure on Monday, 22 March 21 and he had been compliant with his Keppra 1000 mg in the morning 1500 mg at night.  There was no illness no fever no sleep deprivation no other triggers that the patient could identify.  Just the feeling of blurred vision days before.  The wife  approximately estimated the duration of the seizure at 1 minute or less and the last previous seizure was on 12-12-18.  After this phone conversation Vincent David increase the Keppra dose to 1500 twice a day.  He has not had seizures since but it is also only less than 4 weeks ago that this increase took place.  He reports that he has  been irritable and that he feels that the Keppra is contributing to a different personality.  The increase however has not increased the level of irritability he had already on 1000 mg twice daily before.  In the meantime in February he underwent an MRI of the brain with and without contrast which was a normal MRI interpreted by Dr. Andrey Spearman.  He did not mention hippocampal structures, he says that there was no evidence of ischemia but the ventricles were normal in size but there was no mass-effect or midline shift and no lesions seen.  There was also no evidence of sinusitis which sometimes plays a role.  DATE OF RECORDING: 02/07/2017  REFERRING M.D.: Vincent David, M.D.  Study Performed:  Baseline Polysomnogram with EEG montage  HISTORY:  54 year old male Seizure patient reports his very  first seizure took place probably in the year 2012 prior to  his  first sleep evaluation. He also had a seizure during sleep in  January 2013 and was hospitalized for EEG in the hospital at the  time at [facility], [place]- the EEG returned negative. There  was no special etiology found when he had yet another nocturnal  seizure in July 2013. He was not on any medications at the time  and had not started on antiepileptic medications. Last seizure on  02-26-2016 and referred here on Keppra. He reports a head injury  during [employer] in 1986. He was treated for complicated  migraines with visual aura.  In 03-29-2017 he under went a sleep study for OSA, with expanded EEG montage- no seizure activity documented.  developed Complex sleep apnea- CPAP was initiated at 5  cmH20 with heated humidity per AASM split night standards and pressure was advanced to 15 cm water,  the technician changed to BiPAP and ASV mode during the same night- BiPAP final pressure of 18/14cmH20 with or without ST did not improve on hypopneas, apneas and created a [employer] pattern ( AHI of 45). The modality was changed to ASV which helped little, reducing the AHI to 13/hr.     At a CPAP pressure of 12 cmH20, there was a reduction of the AHI to 0.0 with Spo2 nadir at 92% and 110 minutes of sleep time with improvement of obstructive sleep apnea.    Sleep habits are as follows: The patient's dinner time is between 6 PM. The patient goes to bed at 8-9 PM and continues to sleep for 3 hour intervals , wakes spontaneously , not for bathroom breaks,. The preferred sleep position is laterally, right side, with the support of 2 pillows. Dreams are reportedly rare.Vincent David  4-5  AM is the usual rise time. The patient wakes up spontaneously.  He reports not feeling refreshed or restored in AM, and reports many injuries he has suffered, attributed to nocturnal seizure activity - rigid, convulsive type - no initial scream- in nocturnal sleep.    Review of Systems: Out of a complete 14 system review, the patient complains of only the following symptoms, and all other reviewed systems are negative.:  Fatigue, sleepiness , achiness.  How likely are you to doze in the following situations: 0 = not likely, 1 = slight chance, 2 = moderate chance, 3 = high chance   Sitting and Reading? Watching Television? Sitting inactive in a public place (theater or meeting)? As a passenger in a car for an hour without a break? Lying down in the afternoon when circumstances permit? Sitting and talking to someone? Sitting quietly after lunch without alcohol? In a car, while stopped for a few minutes in traffic?   Total = 10/ 24 points   FSS endorsed at 40/ 63 points.   Social History   Socioeconomic History  .  Marital status: Married    Spouse name: Otila Kluver  . Number of children: 3  . Years of education: Not on file  . Highest education level: Not on file  Occupational History    Employer: OTHER  Tobacco Use  . Smoking status: Current Every Day Smoker    Packs/day: 0.50    Types: Cigarettes  . Smokeless tobacco: Never Used  Substance and Sexual Activity  . Alcohol use: Yes    Alcohol/week: 4.0 standard drinks    Types: 4 Standard drinks or equivalent per week  . Drug use: Never  . Sexual activity: Yes    Partners: Female  Other Topics Concern  .  Not on file  Social History Narrative  . Not on file   Social Determinants of Health   Financial Resource Strain:   . Difficulty of Paying Living Expenses:   Food Insecurity:   . Worried About Charity fundraiser in the Last Year:   . Arboriculturist in the Last Year:   Transportation Needs:   . Film/video editor (Medical):   Vincent David Lack of Transportation (Non-Medical):   Physical Activity:   . Days of Exercise per Week:   . Minutes of Exercise per Session:   Stress:   . Feeling of Stress :   Social Connections:   . Frequency of Communication with Friends and Family:   . Frequency of Social Gatherings with Friends and Family:   . Attends Religious Services:   . Active Member of Clubs or Organizations:   . Attends Archivist Meetings:   Vincent David Marital Status:     Family History  Problem Relation Age of Onset  . Sleep apnea Mother   . Diabetes Mother   . High blood pressure Father     Past Medical History:  Diagnosis Date  . Postictal headache   . Seizure ([place])   . Sleep apnea   . Tinnitus     Past Surgical History:  Procedure Laterality Date  . KNEE SURGERY Left   . SHOULDER SURGERY Left      Current Outpatient Medications on File Prior to Visit  Medication Sig Dispense Refill  . levETIRAcetam (KEPPRA) 500 MG tablet Take 3 tablets (1,500 mg total) by mouth 2 (two) times daily. 540 tablet 3  . meloxicam  (MOBIC) 15 MG tablet One tab PO qAM with a meal for 2 weeks, then daily prn pain. 30 tablet 3   No current facility-administered medications on file prior to visit.    No Known Allergies  Physical exam:  Today's Vitals   05/20/19 0923  BP: (!) 159/92  Pulse: 89  Temp: 97.7 F (36.5 C)  Weight: 234 lb (106.1 kg)  Height: 5\' 7"  (1.702 m)   Body mass index is 36.65 kg/m.   Wt Readings from Last 3 Encounters:  05/20/19 234 lb (106.1 kg)  04/06/19 238 lb (108 kg)  02/19/19 239 lb 6.4 oz (108.6 kg)     Ht Readings from Last 3 Encounters:  05/20/19 5\' 7"  (1.702 m)  04/06/19 5\' 7"  (1.702 m)  02/19/19 5\' 7"  (1.702 m)      General: The patient is awake, alert and appears not in acute distress. Head: Normocephalic, atraumatic. Neck is supple. Mallampati 3 plus ,  Tongue bite marks.  neck circumference:18.5 inches . Nasal airflow  patent.  Retrognathia is not  seen.   Cardiovascular:  Regular rate and cardiac rhythm by pulse,  without distended neck veins. Respiratory: Lungs are clear to auscultation.  Skin:  Without evidence of ankle edema, or rash. Trunk: The patient's posture is erect.   Neurologic exam : The patient is awake and alert, oriented to place and time.   Memory subjective described as intact.  Attention span & concentration ability appears normal.  Speech is fluent,  without  dysarthria, dysphonia or aphasia.  Mood and affect are appropriate.   Cranial nerves: no loss of smell or taste reported  Pupils are equal and briskly reactive to light. Funduscopic exam deferred. He reports blurred vision, but no visual field loss.  Extraocular movements in vertical and horizontal planes were intact and without nystagmus. No Diplopia. Visual  fields by finger perimetry are intact. Hearing was intact to soft voice and finger rubbing.    Facial sensation intact to fine touch.  Facial motor strength is symmetric and tongue and uvula move midline.  Neck ROM : rotation, tilt  and flexion extension were normal for age and shoulder shrug was symmetrical.    Motor exam:  Symmetric bulk, tone and ROM.   Normal tone without cog wheeling, symmetric grip strength .  Sensory:  Fine touch, pinprick and vibration were tested  and  normal.  Proprioception tested in the upper extremities was normal.  Coordination: Rapid alternating movements in the fingers/hands were of normal speed.  The Finger-to-nose maneuver was intact without evidence of ataxia, dysmetria or tremor.  Gait and station: Patient could rise unassisted from a seated position, walked without assistive device.  Toe and heel walk were deferred.  Deep tendon reflexes: in the  upper and lower extremities are symmetric and intact.     Total seizure history is about 9 years -  He is an active tobacco user, drinks alcohol , reportedly in moderation- still this is a seizure threshold lowering agent.  We discussed this.     After spending a total time of  32  minutes face to face and additional time for physical and neurologic examination, review of laboratory studies,  personal review of imaging studies, reports and results of other testing and review of referral information / records as far as provided in visit, I have established the following assessments:  1) nocturnal seizures  2) OSA on CPAP  3) irritabiity on Keppra. 4) generalized and social anxiety.     My Plan is to proceed with:  1) referral to Dr Benita Gutter , MD EMU.  2) keep on [place] for now.  3) keep on CPAP - I would appreciate a download with his next visit in 4-6 month with NP.   I would like to thank Emeterio Reeve, DO and [place], [place],  [place] [address] for allowing me to meet with and to take care of this pleasant patient.   I plan to follow up either personally or through our NP within 4-6  month.     Electronically signed by: Larey Seat, MD 05/20/2019 9:43 AM  [facility] and  [employer] Board certified by The [employer] of Sleep Medicine and Diplomate of the [employer] of Sleep Medicine. Board certified In Neurology through the [employer], Fellow of the [employer] of Neurology. Medical Director of [employer].

## 2019-05-22 ENCOUNTER — Ambulatory Visit (INDEPENDENT_AMBULATORY_CARE_PROVIDER_SITE_OTHER): Payer: BC Managed Care – PPO | Admitting: Sports Medicine

## 2019-05-22 ENCOUNTER — Other Ambulatory Visit: Payer: Self-pay

## 2019-05-22 DIAGNOSIS — M25551 Pain in right hip: Secondary | ICD-10-CM | POA: Diagnosis not present

## 2019-05-22 MED ORDER — IBUPROFEN 200 MG PO TABS: 800.0000 mg | ORAL_TABLET | Freq: Four times a day (QID) | ORAL | Status: DC | PRN

## 2019-05-22 MED ORDER — PREDNISONE 50 MG PO TABS: 50.0000 mg | ORAL_TABLET | Freq: Every day | ORAL | 0 refills | Status: DC

## 2019-05-22 NOTE — Assessment & Plan Note (Signed)
This is a pleasant 54 year old male, he does have a seizure disorder, 3 weeks ago he had a generalized tonic-clonic seizure, he now has significant pain in his left periscapular region, this is likely referred pain from his back as well as some rhomboid muscle strain. He responded extremely well to a burst of prednisone and NSAIDs months ago, we are going to do this again, also adding rehab exercises for the cervical spine rhomboids. He would also like to do over-the-counter ibuprofen rather than meloxicam. He declines x-rays, risks, benefits, alternatives explained. Return to see me in 4 weeks as needed.

## 2019-05-22 NOTE — Progress Notes (Signed)
    Procedures performed today:    None.  Independent interpretation of notes and tests performed by another provider:   None.  Brief History, Exam, Impression, and Recommendations:    Chronic right hip and back pain This is a pleasant 54 year old male, he does have a seizure disorder, 3 weeks ago he had a generalized tonic-clonic seizure, he now has significant pain in his left periscapular region, this is likely referred pain from his back as well as some rhomboid muscle strain. He responded extremely well to a burst of prednisone and NSAIDs months ago, we are going to do this again, also adding rehab exercises for the cervical spine rhomboids. He would also like to do over-the-counter ibuprofen rather than meloxicam. He declines x-rays, risks, benefits, alternatives explained. Return to see me in 4 weeks as needed.    ___________________________________________ Gwen Her. Dianah Field, M.D., ABFM., CAQSM. Primary Care and [place] Instructor of [employer] of [facility] of Medicine

## 2019-06-23 ENCOUNTER — Ambulatory Visit: Payer: Self-pay | Admitting: Adult Health

## 2019-06-23 ENCOUNTER — Ambulatory Visit: Payer: Self-pay | Admitting: Family Medicine

## 2019-06-26 ENCOUNTER — Other Ambulatory Visit (HOSPITAL_COMMUNITY)
Admission: RE | Admit: 2019-06-26 | Discharge: 2019-06-26 | Disposition: A | Discharge status: Home / Self Care | Payer: BC Managed Care – PPO | Source: Ambulatory Visit | Attending: Neurology | Admitting: Neurology

## 2019-06-26 DIAGNOSIS — M791 Myalgia, unspecified site: Secondary | ICD-10-CM | POA: Diagnosis not present

## 2019-06-26 DIAGNOSIS — F17210 Nicotine dependence, cigarettes, uncomplicated: Secondary | ICD-10-CM | POA: Diagnosis not present

## 2019-06-26 DIAGNOSIS — F419 Anxiety disorder, unspecified: Secondary | ICD-10-CM | POA: Diagnosis not present

## 2019-06-26 DIAGNOSIS — Z01812 Encounter for preprocedural laboratory examination: Secondary | ICD-10-CM | POA: Insufficient documentation

## 2019-06-26 DIAGNOSIS — R569 Unspecified convulsions: Secondary | ICD-10-CM | POA: Diagnosis not present

## 2019-06-26 DIAGNOSIS — G4733 Obstructive sleep apnea (adult) (pediatric): Secondary | ICD-10-CM | POA: Diagnosis not present

## 2019-06-26 DIAGNOSIS — Z20822 Contact with and (suspected) exposure to covid-19: Secondary | ICD-10-CM | POA: Diagnosis not present

## 2019-06-26 DIAGNOSIS — H543 Unqualified visual loss, both eyes: Secondary | ICD-10-CM | POA: Diagnosis not present

## 2019-06-26 LAB — SARS CORONAVIRUS 2 (TAT 6-24 HRS): SARS Coronavirus 2: NEGATIVE

## 2019-06-29 ENCOUNTER — Inpatient Hospital Stay (HOSPITAL_COMMUNITY): Payer: BC Managed Care – PPO

## 2019-06-29 ENCOUNTER — Inpatient Hospital Stay (HOSPITAL_COMMUNITY)
Admission: RE | Admit: 2019-06-29 | Discharge: 2019-07-02 | DRG: 101 | Disposition: A | Discharge status: Home / Self Care | Payer: BC Managed Care – PPO | Attending: Neurology | Admitting: Neurology

## 2019-06-29 ENCOUNTER — Encounter (HOSPITAL_COMMUNITY): Payer: Self-pay | Admitting: Neurology

## 2019-06-29 ENCOUNTER — Other Ambulatory Visit: Payer: Self-pay

## 2019-06-29 DIAGNOSIS — F419 Anxiety disorder, unspecified: Secondary | ICD-10-CM | POA: Diagnosis present

## 2019-06-29 DIAGNOSIS — F17210 Nicotine dependence, cigarettes, uncomplicated: Secondary | ICD-10-CM | POA: Diagnosis present

## 2019-06-29 DIAGNOSIS — Z20822 Contact with and (suspected) exposure to covid-19: Secondary | ICD-10-CM | POA: Diagnosis present

## 2019-06-29 DIAGNOSIS — G4733 Obstructive sleep apnea (adult) (pediatric): Secondary | ICD-10-CM | POA: Diagnosis not present

## 2019-06-29 DIAGNOSIS — M791 Myalgia, unspecified site: Secondary | ICD-10-CM | POA: Diagnosis present

## 2019-06-29 DIAGNOSIS — R569 Unspecified convulsions: Principal | ICD-10-CM

## 2019-06-29 DIAGNOSIS — H543 Unqualified visual loss, both eyes: Secondary | ICD-10-CM | POA: Diagnosis present

## 2019-06-29 LAB — CBC WITH DIFFERENTIAL/PLATELET
Abs Immature Granulocytes: 0.03 10*3/uL (ref 0.00–0.07)
Basophils Absolute: 0.1 10*3/uL (ref 0.0–0.1)
Basophils Relative: 1 %
Eosinophils Absolute: 0.3 10*3/uL (ref 0.0–0.5)
Eosinophils Relative: 3 %
HCT: 47.5 % (ref 39.0–52.0)
Hemoglobin: 16 g/dL (ref 13.0–17.0)
Immature Granulocytes: 0 %
Lymphocytes Relative: 19 %
Lymphs Abs: 1.6 10*3/uL (ref 0.7–4.0)
MCH: 30.8 pg (ref 26.0–34.0)
MCHC: 33.7 g/dL (ref 30.0–36.0)
MCV: 91.3 fL (ref 80.0–100.0)
Monocytes Absolute: 0.5 10*3/uL (ref 0.1–1.0)
Monocytes Relative: 5 %
Neutro Abs: 6.1 10*3/uL (ref 1.7–7.7)
Neutrophils Relative %: 72 %
Platelets: 224 10*3/uL (ref 150–400)
RBC: 5.2 MIL/uL (ref 4.22–5.81)
RDW: 12.7 % (ref 11.5–15.5)
WBC: 8.4 10*3/uL (ref 4.0–10.5)
nRBC: 0 % (ref 0.0–0.2)

## 2019-06-29 LAB — PROTIME-INR
INR: 1 (ref 0.8–1.2)
Prothrombin Time: 12.6 seconds (ref 11.4–15.2)

## 2019-06-29 LAB — COMPREHENSIVE METABOLIC PANEL
ALT: 23 U/L (ref 0–44)
AST: 17 U/L (ref 15–41)
Albumin: 3.8 g/dL (ref 3.5–5.0)
Alkaline Phosphatase: 117 U/L (ref 38–126)
Anion gap: 11 (ref 5–15)
BUN: 12 mg/dL (ref 6–20)
CO2: 24 mmol/L (ref 22–32)
Calcium: 9.4 mg/dL (ref 8.9–10.3)
Chloride: 103 mmol/L (ref 98–111)
Creatinine, Ser: 1.14 mg/dL (ref 0.61–1.24)
GFR calc Af Amer: >60 mL/min (ref >60)
GFR calc non Af Amer: >60 mL/min (ref >60)
Glucose, Bld: 107 mg/dL — ABNORMAL HIGH (ref 70–99)
Potassium: 4.5 mmol/L (ref 3.5–5.1)
Sodium: 138 mmol/L (ref 135–145)
Total Bilirubin: 0.6 mg/dL (ref 0.3–1.2)
Total Protein: 7 g/dL (ref 6.5–8.1)

## 2019-06-29 LAB — HIV ANTIBODY (ROUTINE TESTING W REFLEX): HIV Screen 4th Generation wRfx: NONREACTIVE

## 2019-06-29 LAB — PHOSPHORUS: Phosphorus: 3.2 mg/dL (ref 2.5–4.6)

## 2019-06-29 LAB — MAGNESIUM: Magnesium: 2.2 mg/dL (ref 1.7–2.4)

## 2019-06-29 MED ORDER — KETOROLAC TROMETHAMINE 30 MG/ML IJ SOLN: 30.0000 mg | Freq: Three times a day (TID) | INTRAMUSCULAR | Status: DC | PRN

## 2019-06-29 MED ORDER — LABETALOL HCL 5 MG/ML IV SOLN: 5.0000 mg | INTRAVENOUS | Status: DC | PRN

## 2019-06-29 MED ORDER — SODIUM CHLORIDE 0.9% FLUSH
3.0000 mL | Freq: Two times a day (BID) | INTRAVENOUS | Status: DC
  Administered 2019-06-29 – 2019-07-01 (×6): 3 mL via INTRAVENOUS

## 2019-06-29 MED ORDER — ENOXAPARIN SODIUM 40 MG/0.4ML ~~LOC~~ SOLN
40.0000 mg | SUBCUTANEOUS | Status: DC
  Filled 2019-06-29: qty 0.4

## 2019-06-29 MED ORDER — ACETAMINOPHEN 325 MG PO TABS: 650.0000 mg | ORAL_TABLET | ORAL | Status: DC | PRN

## 2019-06-29 MED ORDER — ACETAMINOPHEN 650 MG RE SUPP: 650.0000 mg | RECTAL | Status: DC | PRN

## 2019-06-29 MED ORDER — LORAZEPAM 2 MG/ML IJ SOLN: 2.0000 mg | INTRAMUSCULAR | Status: DC | PRN

## 2019-06-29 MED ORDER — IBUPROFEN 200 MG PO TABS
400.0000 mg | ORAL_TABLET | Freq: Four times a day (QID) | ORAL | Status: DC | PRN
  Administered 2019-06-29: 400 mg via ORAL
  Filled 2019-06-29: qty 2

## 2019-06-29 NOTE — Progress Notes (Signed)
Pt arrived to 3W12 with wife at bedside, alert and oriented x4, VSS. MD Hortense Ramal notified of patients arrival. Complains of only generalized "aches and pains"   06/29/19 0816  Vitals  Temp 98.3 F (36.8 C)  Temp Source Oral  BP (!) 147/71  MAP (mmHg) 92  BP Location Right Arm  BP Method Automatic  Patient Position (if appropriate) Sitting  Pulse Rate 90  Resp 18  Oxygen Therapy  SpO2 97 %  O2 Device Room Air

## 2019-06-29 NOTE — Progress Notes (Signed)
EMU Hookup complete - no initial skin breakdown.  Sitter duties explained. [facility] notified and monitoring.  Push button tested with [facility].  

## 2019-06-29 NOTE — H&P (Signed)
CC: Convulsions  History is obtained from: Patient, wife at bedside, chart review  HPI: Vincent David is a 54 y.o. male with past medical history of obstructive sleep apnea on CPAP and convulsions who was admitted to epilepsy monitoring unit for characterization of episodes.  Patient states he started having seizure-like episodes about 10 years ago.  States they always happen when he is asleep, (can happen during sleep at daytime or at nighttime).  He states that he does not remember the episodes but usually wakes up next morning feeling tired, confused and has unsteady gait.  He states he had a head injury while he was in [employer] back in 1988 and since then developed headaches with blurred vision as well as loss of vision in both eyes intermittently every few months.  He thinks he has the seizure-like episodes usually about a week after he has these headaches.  Wife at bedside states these episodes used to happen early in the morning in the past but now has been happening 2 to 3 hours after he goes to bed.  She does not remember witnessing the whole episode from the beginning except maybe a couple of times a long time ago and per her description she remembers noticing bilateral lower extremity twitching in the beginning followed by generalized tonic-clonic seizure.  Patient also reports bilateral tongue bite without actual tongue laceration as well as urinary incontinence but no stool incontinence about half the times during these episodes.  Denies ever having any episode while he is was awake.  States he was first seen in [place] when these episodes started and had a sleep study and was prescribed CPAP.  Denies ever having any EEG or an EMU admission in the past.  Seizure type I Aura: None Description of episode: Possible bilateral lower extremity twitching followed by generalized tonic-clonic seizure, about half the time associated with bilateral tongue bite without laceration and urinary  incontinence Frequency: Once every few months, always when patient is asleep Most recent episode: 11 days ago Status epilepticus: Reports consecutive convergence about twice in the past 10 years   Seizure risk factors: Denies family history of seizures, prior history of meningitis/encephalitis, febrile seizures  MRI brain with and without contrast 03/10/2019: Normal MRI brain.  ROS: All other systems reviewed and negative except as noted in the HPI.   Past Medical History:  Diagnosis Date  . Postictal headache   . Seizure ([place])   . Sleep apnea   . Tinnitus     Family History  Problem Relation Age of Onset  . Sleep apnea Mother   . Diabetes Mother   . High blood pressure Father    Social History:  reports that he has been smoking cigarettes. He has been smoking about 0.50 packs per day. He has never used smokeless tobacco. He reports current alcohol use of about 4.0 standard drinks of alcohol per week. He reports that he does not use drugs.   Exam: Current vital signs: BP (!) 147/71 (BP Location: Right Arm)   Pulse 90   Temp 98.1 F (36.7 C) (Oral)   Resp 18   SpO2 97%  Vital signs in last 24 hours: Temp:  [98.1 F (36.7 C)-98.3 F (36.8 C)] 98.1 F (36.7 C) (05/24 1206) Pulse Rate:  [90] 90 (05/24 0816) Resp:  [18] 18 (05/24 0816) BP: (147)/(71) 147/71 (05/24 0816) SpO2:  [97 %] 97 % (05/24 0816)   Physical Exam  Constitutional: Appears well-developed and well-nourished.  Psych: Affect appropriate to  situation Eyes: No scleral injection HENT: No OP obstrucion Head: Normocephalic.  Cardiovascular: Normal rate and regular rhythm.  Respiratory: Effort normal, non-labored breathing GI: Soft.  No distension. There is no tenderness.  Skin: Warm Neuro: AOx3, cranial nerves II to XII grossly intact, 5/5 in all 4 extremities   I have reviewed labs in epic and the results pertinent to this consultation are: Ordered and pending  I have reviewed the images  obtained: MRI brain with and without contrast 03/10/2019: No acute abnormality  ASSESSMENT/PLAN: 54 year old male admitted to epilepsy monitoring unit for characterization of spells   Convulsions -We will start video EEG monitoring for characterization of spells next -we will hold home dose of Keppra 1500 mg twice daily -We will plan for hyperventilation photic stimulation tomorrow -Encourage sleeping as patient states all his episodes are triggered during sleep   Generalized myalgia -Patient states his common for him to have generalized myalgia after seizures for few days to weeks -As needed Tylenol, ibuprofen and Toradol ordered  Obstructive sleep apnea -Continue home CPAP    I have spent a total of 75  minutes with the patient reviewing hospital notes,  test results, labs and examining the patient as well as establishing an assessment and plan that was discussed personally with the patient and his wife at bedside.  > 50% of time was spent in direct patient care.     Zeb Comfort Epilepsy [facility]

## 2019-06-30 ENCOUNTER — Telehealth: Payer: Self-pay | Admitting: Neurology

## 2019-06-30 NOTE — Progress Notes (Signed)
Just peeked in room without opening door because pt said he did not get any solid sleep last night because he was awakened q 4 hours and this nurse was in the room every couple hours checking on him and he said he has his seizures when he is asleep but his sleep was disturbed often last night.  MD said to just do his vitals 1 time a shift and his assessment as well and to allow him to sleep.  This nurse did look in on him from the door and he appears to be sleeping. No signs of distress and no calls from EEG.

## 2019-06-30 NOTE — Progress Notes (Signed)
Subjective: No seizure-like episodes overnight.  Patient requested to minimize interruptions in sleep.  Denies any other concerns.   ROS: negative except above  Examination  Vital signs in last 24 hours: Temp:  [97.9 F (36.6 C)-98.9 F (37.2 C)] 98.9 F (37.2 C) (05/25 1119) Pulse Rate:  [74-87] 81 (05/25 1119) Resp:  [13-22] 13 (05/25 1119) BP: (141-159)/(66-87) 148/77 (05/25 1119) SpO2:  [94 %-98 %] 97 % (05/25 1119)  General: lying in bed, not in apparent distress CVS: pulse-normal rate and rhythm RS: breathing comfortably, no accessory muscles of respiration used Extremities: normal, warm Neuro: AOx3, cranial nerves grossly intact, spontaneously moving all four extremities against gravity  Basic Metabolic Panel: Recent Labs  Lab 06/29/19 1001  NA 138  K 4.5  CL 103  CO2 24  GLUCOSE 107*  BUN 12  CREATININE 1.14  CALCIUM 9.4  MG 2.2  PHOS 3.2    CBC: Recent Labs  Lab 06/29/19 1001  WBC 8.4  NEUTROABS 6.1  HGB 16.0  HCT 47.5  MCV 91.3  PLT 224     Coagulation Studies: Recent Labs    06/29/19 1001  LABPROT 12.6  INR 1.0    Imaging No new brain imaging overnight  ASSESSMENT AND PLAN: 54 year old male admitted to epilepsy monitoring unit for characterization of spells   Convulsions -Continue video EEG monitoring for characterization of spells next -we will continue to hold hold home dose of Keppra 1500 mg twice daily -We will plan for hyperventilation photic stimulation tomorrow -Encourage sleeping as patient states all his episodes are triggered during sleep -Discussed difference between epilepsy and nonepileptic seizures, potential treatment options and differences in management of epileptic and nonepileptic seizures. -We will also discontinue Lovenox as patient relatively immobile and per patient request -We will discontinue vital signs every 4 hours to minimize sleep interruptions  Generalized myalgia -Patient states his common for  him to have generalized myalgia after seizures for few days to weeks -As needed Tylenol, ibuprofen and Toradol ordered  Obstructive sleep apnea -Continue home CPAP   I have spent a total of35  minuteswith the patient reviewing hospitalnotes,  test results, labs and examining the patient as well as establishing an assessment and plan that was discussed personally with the patient and his wife at bedside.>50% of time was spent in direct patient care.   Zeb Comfort Epilepsy [facility] For questions after 5pm please refer to AMION to reach the Neurologist on call

## 2019-06-30 NOTE — Progress Notes (Signed)
maint complete.  

## 2019-06-30 NOTE — Telephone Encounter (Signed)
We added the following information:   [employer] , generated on May 19, 2021May 19, 2021 Printout Information  Document Contents Document Received Date Document Source Organization  Continuity of Care Document May 19, 2021May 19, 2021 Surescripts HISP   Patient Demographics - 54 y.o. Male; born [birth year]  Patient Address Communication Language Race / Ethnicity Marital Status  [address] [place], [place] [address] [phone] Unknown Unknown / Unknown Unknown   Immunizations Administered Reconcile with Patient's Chart A [facility] health care provider has provided immunization services to the patient named above. The patient has identified you as their primary care provider. Please update the patient's chart to include the following immunization(s) listed below. In addition, immunization information will be reported to the state immunization registry where permitted.   Vaccine Administered Dose Injection Route / Site Date Administered Manufacturer Lot # Expiration Date Administering Provider  (208) COVID-19, mRNA, LNP-S, PF, 30 mcg/0.3 mL dose .30 ML IM / RD 06/22/2019 [employer]ER MANU[employer]CT [id number] 10/06/2019   IMPORTANT WARNING: This message is intended for the use of the person or entity to whom it is addressed and may contain information that is privileged and confidential, the disclosure of which is governed by applicable law. If the reader of this message is not the intended recipient, or the employee or agent responsible for delivering it to the intended recipient, you are hereby notified that any dissemination, distribution or copying of this information is STRICTLY PROHIBITED. 'If you have received this message in error, please notify us immediately.'   Images Document Information   Custodian Organization  [facility]

## 2019-06-30 NOTE — Procedures (Signed)
Patient Name: Vincent David  MRN: [id number]  Epilepsy Attending: Lora Havens  Referring Physician/Provider: Dr. Zeb Comfort Duration: 06/29/2019 [id number] to 06/30/2019 [id number]  Patient history: 54 year old male with seizure-like episodes in sleep.  EEG to evaluate for seizures.  Level of alertness: Awake, asleep  AEDs during EEG study: None  Technical aspects: This EEG study was done with scalp electrodes positioned according to the 10-20 International system of electrode placement. Electrical activity was acquired at a sampling rate of 500Hz  and reviewed with a high frequency filter of 70Hz  and a low frequency filter of 1Hz . EEG data were recorded continuously and digitally stored.   Description: The posterior dominant rhythm consists of 10 Hz activity of moderate voltage (25-35 uV) seen predominantly in posterior head regions, symmetric and reactive to eye opening and eye closing. Sleep was characterized by vertex waves, sleep spindles (12 to 14 Hz), maximal frontocentral region. Hyperventilation and photic stimulation were not performed.     IMPRESSION: This study is within normal limits. No seizures or epileptiform discharges were seen throughout the recording.  Vincent David

## 2019-07-01 NOTE — Progress Notes (Signed)
Subjective: No acute events overnight. States he slept better last night. Denies any concerns today.  ROS: negative except above  Examination  Vital signs in last 24 hours: Temp:  [98.6 F (37 C)-98.9 F (37.2 C)] 98.6 F (37 C) (05/26 0731) Pulse Rate:  [70-81] 70 (05/26 0731) Resp:  [13-14] 14 (05/26 0731) BP: (135-148)/(77-78) 135/78 (05/26 0731) SpO2:  [96 %-97 %] 96 % (05/26 0731)  General: lying in bed, not in apparent distress CVS: pulse-normal rate and rhythm RS: breathing comfortably, no accessory muscles of respiration used Extremities: normal, warm Neuro: AOx3, cranial nerves grossly intact, spontaneously moving all four extremities against gravity  Basic Metabolic Panel: Recent Labs  Lab 06/29/19 1001  NA 138  K 4.5  CL 103  CO2 24  GLUCOSE 107*  BUN 12  CREATININE 1.14  CALCIUM 9.4  MG 2.2  PHOS 3.2    CBC: Recent Labs  Lab 06/29/19 1001  WBC 8.4  NEUTROABS 6.1  HGB 16.0  HCT 47.5  MCV 91.3  PLT 224     Coagulation Studies: Recent Labs    06/29/19 1001  LABPROT 12.6  INR 1.0    Imaging No new brain imaging overnight  ASSESSMENT AND PLAN: 54 year old male admitted to epilepsy monitoring unit for characterization of spells   Convulsions -Continue video EEG monitoring for characterization of spells next -we will continue to hold hold home dose of Keppra 1500 mg twice daily -We will plan for hyperventilation and photic stimulation today -Encourage sleeping as patient states all his episodes are triggered during sleep -Updated patient about video EEG findings overnight, plan for hyperventilation and photic stimulation, potential discharge on Friday afternoon.  Obstructive sleep apnea -Continue home CPAP   I have spent a total of14minuteswith the patient reviewing hospitalnotes, test results, labs and examining the patient as well as establishing an assessment and plan that was discussed personally with the patient and his  wife at bedside.>50% of time was spent in direct patient care.   Zeb Comfort Epilepsy [facility] For questions after 5pm please refer to AMION to reach the Neurologist on call

## 2019-07-01 NOTE — Procedures (Signed)
Patient Name: Casmere Mainer  MRN: [id number]  Epilepsy Attending: Lora Havens  Referring Physician/Provider: Dr. Zeb Comfort Duration: 06/30/2019 [id number] to 07/01/2019 [id number]  Patient history: 54 year old male with seizure-like episodes in sleep.  EEG to evaluate for seizures.  Level of alertness: Awake, asleep  AEDs during EEG study: None  Technical aspects: This EEG study was done with scalp electrodes positioned according to the 10-20 International system of electrode placement. Electrical activity was acquired at a sampling rate of 500Hz  and reviewed with a high frequency filter of 70Hz  and a low frequency filter of 1Hz . EEG data were recorded continuously and digitally stored.   Description: The posterior dominant rhythm consists of 10 Hz activity of moderate voltage (25-35 uV) seen predominantly in posterior head regions, symmetric and reactive to eye opening and eye closing. Sleep was characterized by vertex waves, sleep spindles (12 to 14 Hz), maximal frontocentral region. Hyperventilation and photic stimulation were not performed.     IMPRESSION: This study is within normal limits. No seizures or epileptiform discharges were seen throughout the recording.  Priyanka Barbra Sarks

## 2019-07-01 NOTE — Progress Notes (Signed)
EEG maintenance complete. No skin breakdown at FP1 FP2 O2 Photic and HV done. Continue to monitor

## 2019-07-02 MED ORDER — LEVETIRACETAM 750 MG PO TABS
1500.0000 mg | ORAL_TABLET | ORAL | Status: AC
  Administered 2019-07-02: 1500 mg via ORAL
  Filled 2019-07-02: qty 2

## 2019-07-02 MED ORDER — BACITRACIN-NEOMYCIN-POLYMYXIN OINTMENT TUBE
TOPICAL_OINTMENT | CUTANEOUS | Status: DC | PRN
  Filled 2019-07-02: qty 14

## 2019-07-02 NOTE — Procedures (Addendum)
Patient Name:Vincent David [id number] Epilepsy Attending:Kema Santaella Barbra Sarks Referring Physician/Provider:Dr. Zeb Comfort Duration:07/01/2019 [id number]  Patient history:54 year old male with seizure-like episodes in sleep. EEG to evaluate for seizures.  Level of alertness:Awake, asleep  AEDs during EEG study:None  Technical aspects: This EEG study was done with scalp electrodes positioned according to the 10-20 International system of electrode placement. Electrical activity was acquired at a sampling rate of 500Hz  and reviewed with a high frequency filter of 70Hz  and a low frequency filter of 1Hz . EEG data were recorded continuously and digitally stored.   Description: The posterior dominant rhythm consists of 10 Hz activity of moderate voltage (25-35 uV) seen predominantly in posterior head regions, symmetric and reactive to eye opening and eye closing. Sleep was characterized by vertex waves, sleep spindles (12 to 14 Hz), maximal frontocentral region. No EEG change was seen during hyperventilation. Physiologic photic driving was seen during photic stimulation were not performed.   IMPRESSION: This study is within normal limits. No seizures or epileptiform discharges were seen throughout the recording.  Anetria Harwick Barbra Sarks

## 2019-07-02 NOTE — Progress Notes (Signed)
EMU admission discontinued.  electrodes removed [facility] notified  Mild skin abrasion at f4, f8, a1, and a2  RN notified.

## 2019-07-02 NOTE — Progress Notes (Signed)
Pt discharge education and instructions completed with pt at bedside. Pt voices understanding and denies any questions. Pt IV and telemetry removed. Pt discharge home with family at bedside to transport off to disposition. Pt transported off unit via wheelchair with belongings and family at side. Francis Gaines Heriberto Stmartin RN.

## 2019-07-02 NOTE — Discharge Instructions (Signed)
You were admitted to Epilepsy Monitoring Unit from 06/29/2019 to 07/02/2019 and underwent video EEG monitoring.  Keppra was held during this admission since day 1.  Hyperventilation and photic stimulation were also performed.  Your eeg was within normal limits. NO seizures and epileptiform discharges were seen during this study. No typical events were recorded during this admission.  At time of discharge we discussed the diagnosis of possible nonepileptic events.  However, as no typical events were captured, epilepsy cannot be completely excluded at this point.  You also reported increasing anxiety due to [place] and we discussed talking to your primary neurologist Dr. Brett Fairy regarding possibly weaning off Keppra and may be consider transitioning to lamotrigine.  Please continue to follow seizure precautions.

## 2019-07-02 NOTE — Discharge Summary (Addendum)
Physician Discharge Summary  Patient ID: Vincent David MRN: [id number] DOB/AGE: [birth year] 54 y.o.  Admit date: 06/29/2019 Discharge date: 07/02/2019  Admission Diagnoses: Convulsions  Discharge Diagnoses:  Active Problems:   Convulsion [facility])   Discharged Condition: stable  Hospital Course: Vincent David was admitted to epilepsy monitoring unit from 06/29/2019 to 07/02/2019 and underwent video EEG monitoring.  During this time levetiracetam was held as well as patient had hyperventilation and photic stimulation.  Sleep deprivation was not performed as patient reported all his episodes always happen during sleep.  The video EEG monitoring during this.  Was within normal limits, no seizures or epileptiform discharges were seen during the study.  However, no typical events were captured.  On 07/02/2019, patient requested to go home as it was difficult for him to sleep continuously in the hospital.  I discussed with patient that at this point, even though we cannot completely exclude epilepsy, it is highly unlikely.  Diagnosis of nonepileptic events was also discussed with patient.  We also discussed potentially repeating admission in future in case his episodes become more frequent.  Also, patient reported that Keppra has not substantially changed the frequency of his events and they continue to happen on an average every few months.  Patient also reported increasing anxiety because of Keppra.  Therefore we discussed possibly talking to Dr. Brett Fairy and weaning off Keppra, consider switching to lamotrigine as it has been beneficial in patients with epilepsy as well as nonepileptic events.  Continue seizure precautions.   Consults: None  Significant Diagnostic Studies: EEG 06/29/2019-07/02/2019  Description: The posterior dominant rhythm consists of 10 Hz activity of moderate voltage (25-35 uV) seen predominantly in posterior head regions, symmetric and reactive to eye opening and eye closing. Sleep was  characterized by vertex waves, sleep spindles (12 to 14 Hz), maximal frontocentral region. No EEG change was seen during hyperventilation. Physiologic photic driving was seen during photic stimulation were not performed.   IMPRESSION: This study is within normal limits. No seizures or epileptiform discharges were seen throughout the recording.   Treatments:  Keppra 1500mg  BID  Discharge Exam: Blood pressure (!) 155/77, pulse 71, temperature 98.3 F (36.8 C), temperature source Oral, resp. rate 16, height 5\' 7"  (1.702 m), weight 103.9 kg, SpO2 95 %.   Disposition:  General: lying in bed,not in apparent distress CVS: pulse-normal rate and rhythm RS: breathing comfortably,no accessory muscles of respiration used Extremities: normal,warm Neuro:AOx3, cranial nerves grossly intact, spontaneously moving all four extremities against gravity  Allergies as of 07/02/2019   No Known Allergies     Medication List    TAKE these medications   ibuprofen 200 MG tablet Commonly known as: Advil Take 4 tablets (800 mg total) by mouth every 6 (six) hours as needed. What changed:   how much to take  when to take this  reasons to take this   levETIRAcetam 500 MG tablet Commonly known as: KEPPRA Take 3 tablets (1,500 mg total) by mouth 2 (two) times daily.        I have spent a total of  50  minutes with the patient reviewing hospital notes,  test results, labs and examining the patient as well as establishing an assessment and plan that was discussed personally with the patient and son at bedside including diagnosis of nonepileptic events, adjusting Keppra, possibly using lamotrigine.  > 50% of time was spent in direct patient care.     Signed: Lora Havens 07/02/2019, 10:21 AM

## 2019-07-02 NOTE — TOC Transition Note (Signed)
Transition of Care [facility]) - CM/SW Discharge Note   Patient Details  Name: Vincent David MRN: [id number] Date of Birth: [birth year]  [facility] CM/SW Contact:  Pollie Friar, RN Phone Number: 07/02/2019, 11:08 AM   Clinical Narrative:    Pt discharging home with self care. Pt has transportation home.    Final next level of care: Home/Self Care Barriers to Discharge: No Barriers Identified   Patient Goals and CMS Choice        Discharge Placement                       Discharge Plan and Services                                     Social Determinants of Health (SDOH) Interventions     Readmission Risk Interventions No flowsheet data found.

## 2019-07-02 NOTE — Progress Notes (Signed)
Still asleep.  Noted to be asleep when this nurse did look in the room.   Dr still wanted him to be left alone and only disturbed when he gets up or calls for assist, as he has his seizures during sleep and he kept being disturbed when he had to be assessed q 4 hours.

## 2019-07-03 ENCOUNTER — Encounter: Payer: Self-pay | Admitting: Neurology

## 2019-07-15 DIAGNOSIS — G4733 Obstructive sleep apnea (adult) (pediatric): Secondary | ICD-10-CM | POA: Diagnosis not present

## 2019-07-17 MED ORDER — LAMOTRIGINE 25 MG PO TABS
ORAL_TABLET | ORAL | 3 refills | Status: DC
Start: 2019-07-17 — End: 2019-09-03

## 2019-07-17 NOTE — Telephone Encounter (Signed)
Vincent David or Vincent David , please inform our patient of these changes following his EEG with Dr Rosina Lowenstein.   Lamictal : Week one - 25 mg at night po for 7 days. Week two - 25 mg bid po for 7 days. Week three and four- 50 mg bid for 14 days and RV with provider.   Keppra should have been discontinued by week 4.  Keppra 1000 mg bid po week 1, and reduce to  500 mg bid po week 2 and 3 . Week 4 NP revisit. Labs CMET, CBC - no AED levels needed.   Larey Seat, MD

## 2019-07-17 NOTE — Telephone Encounter (Signed)
Glad to hear that EEG was negative. We can wean off Keppra and start Lamictal po. Keppra can be rapidly decreased to 1000 mg bid from current dose for 8 days , and 500 mg bid for 14 days, than d/c. Lamictal will start at 25 mg daily for one week, 25 mg bid for another week, 50 mg bid week 3/4.  Please inform patient of this schedule and arrange for NP [place]  To follow up in 4 weeks, lab to be drawn.  Larey Seat, MD

## 2019-07-20 ENCOUNTER — Telehealth: Payer: Self-pay | Admitting: Family Medicine

## 2019-07-20 NOTE — Telephone Encounter (Signed)
Pt called stating that Dr. Brett Fairy has instructed him to call to speak to RN about his NCV/EMG results. Please advise.

## 2019-07-20 NOTE — Telephone Encounter (Signed)
I have sent my chart message to the pt about the weaning schedule. See message from 07/20/2019 for reference.

## 2019-07-21 NOTE — Telephone Encounter (Signed)
FYI pt has called and has scheduled his 4 week f/u.  He accepted 07-29 with a 8:00 check in, this appointment is with Dr Brett Fairy, no call back requested

## 2019-07-21 NOTE — Telephone Encounter (Signed)
Thanks sandy I have reached out to the pt via my chart about this.

## 2019-08-05 ENCOUNTER — Other Ambulatory Visit: Payer: Self-pay

## 2019-08-05 ENCOUNTER — Encounter: Payer: Self-pay | Admitting: Sports Medicine

## 2019-08-05 ENCOUNTER — Ambulatory Visit (INDEPENDENT_AMBULATORY_CARE_PROVIDER_SITE_OTHER): Payer: BC Managed Care – PPO | Admitting: Sports Medicine

## 2019-08-05 DIAGNOSIS — M25551 Pain in right hip: Secondary | ICD-10-CM

## 2019-08-05 NOTE — Progress Notes (Signed)
    Procedures performed today:    Procedure: Real-time Ultrasound Guided injection of the right hip joint Device: Samsung [id number]  Verbal informed consent obtained.  Time-out conducted.  Noted no overlying erythema, induration, or other signs of local infection.  Skin prepped in a sterile fashion.  Local anesthesia: Topical Ethyl chloride.  With sterile technique and under real time ultrasound guidance: 1 cc Kenalog 40, 2 cc lidocaine, 2 cc bupivacaine injected easily Completed without difficulty  Pain immediately resolved suggesting accurate placement of the medication.  Advised to call if fevers/chills, erythema, induration, drainage, or persistent bleeding.  Images permanently stored and available for review in the ultrasound unit.  Impression: Technically successful ultrasound guided injection.  Independent interpretation of notes and tests performed by another provider:   None.  Brief History, Exam, Impression, and Recommendations:    Chronic right hip and back pain Vincent David returns, he is a pleasant 54 year old male with a history of generalized tonic-clonic seizures, approximately 2 months ago he had a seizure, and had significant pain in his back, hip, we tried a burst of prednisone, NSAIDs, this improved his symptoms for a brief period of time. X-rays at the time showed degenerative changes in his hip and lumbar spine. Today he continues to have pain, he localizes it to his groin. There is some pain with internal rotation and resisted hip flexion. Today we performed a hip joint injection, his hip flexor/iliopsoas should get some effect as well. Return to see me in a month, hip MRI if no better.    ___________________________________________ Gwen Her. Dianah Field, M.D., ABFM., CAQSM. Primary Care and [place] Instructor of [employer]

## 2019-08-05 NOTE — Assessment & Plan Note (Signed)
Vincent David returns, he is a pleasant 54 year old male with a history of generalized tonic-clonic seizures, approximately 2 months ago he had a seizure, and had significant pain in his back, hip, we tried a burst of prednisone, NSAIDs, this improved his symptoms for a brief period of time. X-rays at the time showed degenerative changes in his hip and lumbar spine. Today he continues to have pain, he localizes it to his groin. There is some pain with internal rotation and resisted hip flexion. Today we performed a hip joint injection, his hip flexor/iliopsoas should get some effect as well. Return to see me in a month, hip MRI if no better.

## 2019-08-12 ENCOUNTER — Telehealth: Payer: Self-pay | Admitting: Neurology

## 2019-08-12 NOTE — Telephone Encounter (Signed)
Wife has called to report that last night pt had another seizure.

## 2019-08-13 ENCOUNTER — Encounter (HOSPITAL_COMMUNITY): Payer: Self-pay

## 2019-08-13 ENCOUNTER — Other Ambulatory Visit: Payer: Self-pay

## 2019-08-13 ENCOUNTER — Emergency Department (HOSPITAL_COMMUNITY)
Admission: EM | Admit: 2019-08-13 | Discharge: 2019-08-13 | Disposition: A | Discharge status: Home / Self Care | Payer: BC Managed Care – PPO | Attending: Emergency Medicine | Admitting: Emergency Medicine

## 2019-08-13 ENCOUNTER — Emergency Department (INDEPENDENT_AMBULATORY_CARE_PROVIDER_SITE_OTHER)
Admission: EM | Admit: 2019-08-13 | Discharge: 2019-08-13 | Disposition: A | Discharge status: Home / Self Care | Payer: BC Managed Care – PPO | Source: Home / Self Care

## 2019-08-13 ENCOUNTER — Telehealth: Payer: Self-pay | Admitting: Neurology

## 2019-08-13 DIAGNOSIS — F05 Delirium due to known physiological condition: Secondary | ICD-10-CM

## 2019-08-13 DIAGNOSIS — H5702 Anisocoria: Secondary | ICD-10-CM

## 2019-08-13 DIAGNOSIS — G40909 Epilepsy, unspecified, not intractable, without status epilepticus: Secondary | ICD-10-CM | POA: Insufficient documentation

## 2019-08-13 DIAGNOSIS — F17210 Nicotine dependence, cigarettes, uncomplicated: Secondary | ICD-10-CM | POA: Insufficient documentation

## 2019-08-13 DIAGNOSIS — G8929 Other chronic pain: Secondary | ICD-10-CM

## 2019-08-13 DIAGNOSIS — M25551 Pain in right hip: Secondary | ICD-10-CM

## 2019-08-13 DIAGNOSIS — R561 Post traumatic seizures: Secondary | ICD-10-CM | POA: Diagnosis not present

## 2019-08-13 DIAGNOSIS — R4182 Altered mental status, unspecified: Secondary | ICD-10-CM | POA: Diagnosis not present

## 2019-08-13 DIAGNOSIS — Z79899 Other long term (current) drug therapy: Secondary | ICD-10-CM | POA: Diagnosis not present

## 2019-08-13 NOTE — ED Provider Notes (Signed)
[facility]    CSN: [id number] Arrival date & time: 08/13/19  0848      History   Chief Complaint Chief Complaint  Patient presents with  . Hip Pain    RT    HPI Geovannie Vilar is a 54 y.o. male.   HPI Antonious Omahoney is a 54 y.o. male with hx of seizures, brought to [facility] by his wife with concern for continued confusion following a seizure at 2AM yesterday, 08/12/19, morning.  Wife had called neurologist, Dr. Asencion Partridge, who recommended pt be evaluated urgently in the emergency department. Pt does not believe he had or is having a stroke but does c/o exacerbation of chronic Right hip pain. He is unsure if he fell out of bed during his seizure but notes his seizures do typically occur at night.  Recent change in his mediation. Last seizure was 3-4 months ago.  He reports feeling anxious today but not sure why. Denies feeling weakness or numbness in arms or legs.  Per medical records, phone note from wife this morning to neurologist, pt does not seem completely recovered yet.  He had difficulty findings words and repeated movements "as if he tries to play a charade" with her.  Pt states this is normal for him to be confused after a seizure, however, his neurologist stated concern for extended confusion state.   Past Medical History:  Diagnosis Date  . Postictal headache   . Seizure ([place])   . Sleep apnea   . Tinnitus     Patient Active Problem List   Diagnosis Date Noted  . Convulsion ([place]) 06/29/2019  . Abdominal aortic ectasia ([place]) 04/09/2019  . Chronic right hip and back pain 04/06/2019  . Mixed hyperlipidemia 02/10/2019  . Obesity (BMI 35.0-39.9 without comorbidity) 06/23/2018  . Treatment-emergent central sleep apnea 06/23/2018  . OSA on CPAP 06/23/2018  . Nocturnal seizures ([place]) 06/23/2018    Past Surgical History:  Procedure Laterality Date  . KNEE SURGERY Left   . SHOULDER SURGERY Left        Home Medications    Prior to Admission medications     Medication Sig Start Date End Date Taking? Authorizing Provider  ibuprofen (ADVIL) 200 MG tablet Take 4 tablets (800 mg total) by mouth every 6 (six) hours as needed. Patient taking differently: Take 400 mg by mouth as needed for mild pain or moderate pain.  05/22/19   Silverio Decamp, MD  lamoTRIgine (LAMICTAL) 25 MG tablet Week one 25 mg at night po for 7 days. Week two 25 mg bid po for 7 days. Week three and four 50 mg bid for 14 days and RV with provider. Keppra should have been discontinued by week 4. 07/17/19   Dohmeier, Asencion Partridge, MD    Family History Family History  Problem Relation Age of Onset  . Sleep apnea Mother   . Diabetes Mother   . High blood pressure Father     Social History Social History   Tobacco Use  . Smoking status: Current Every Day Smoker    Packs/day: 0.50    Types: Cigarettes  . Smokeless tobacco: Never Used  Vaping Use  . Vaping Use: Never used  Substance Use Topics  . Alcohol use: Yes    Alcohol/week: 4.0 standard drinks    Types: 4 Standard drinks or equivalent per week  . Drug use: Never     Allergies   Patient has no known allergies.   Review of Systems Review of Systems  Eyes: Negative for pain and visual disturbance.  Cardiovascular: Negative for chest pain and palpitations.  Musculoskeletal: Positive for arthralgias (Right hip) and gait problem.  Neurological: Negative for dizziness, light-headedness and headaches.  Psychiatric/Behavioral: Positive for confusion. The patient is nervous/anxious.      Physical Exam Triage Vital Signs ED Triage Vitals  Enc Vitals Group     BP 08/13/19 0859 (!) 184/75     Pulse Rate 08/13/19 0859 (!) 109     Resp 08/13/19 0859 16     Temp 08/13/19 0859 98.2 F (36.8 C)     Temp Source 08/13/19 0859 Oral     SpO2 08/13/19 0859 96 %     Weight --      Height --      Head Circumference --      Peak Flow --      Pain Score 08/13/19 0901 9     Pain Loc --      Pain Edu? --      Excl. in  [place]? --    No data found.  Updated Vital Signs BP (!) 184/75 (BP Location: Right Arm)   Pulse (!) 109   Temp 98.2 F (36.8 C) (Oral)   Resp 16   SpO2 96%   Visual Acuity Right Eye Distance:   Left Eye Distance:   Bilateral Distance:    Right Eye Near:   Left Eye Near:    Bilateral Near:     Physical Exam Vitals and nursing note reviewed.  Constitutional:      Appearance: Normal appearance. He is well-developed.     Comments: Mildly anxious but cooperative during exam  HENT:     Head: Normocephalic and atraumatic.     Right Ear: Tympanic membrane and ear canal normal.     Left Ear: Tympanic membrane and ear canal normal.     Nose: Nose normal.     Mouth/Throat:     Lips: Pink.     Mouth: Mucous membranes are moist.     Pharynx: Oropharynx is clear. Uvula midline.  Eyes:     Extraocular Movements: Extraocular movements intact.     Pupils: Pupils are unequal (Left slightly smaller than Right).     Right eye: Pupil is not sluggish.     Left eye: Pupil is sluggish.  Cardiovascular:     Rate and Rhythm: Normal rate and regular rhythm.  Pulmonary:     Effort: Pulmonary effort is normal. No respiratory distress.     Breath sounds: Normal breath sounds.  Musculoskeletal:        General: Normal range of motion.     Cervical back: Normal range of motion.  Skin:    General: Skin is warm and dry.  Neurological:     Mental Status: He is alert and oriented to person, place, and time.     Comments: Speech is clear, able to recall his wife's name and date of birth, his name of birth. Unable to name the president or vice president.  Unable to recall if he fell out of bed during seizure. Not sure if he woke up in bed or on the floor.  Able to follow 2 step commands. Antalgic gait (Right hip pain)   Psychiatric:        Mood and Affect: Mood is anxious (mildly).        Behavior: Behavior normal.      UC Treatments / Results  Labs (all labs ordered are listed, but only  abnormal results are displayed) Labs Reviewed - No data to display  EKG   Radiology No results found.  Procedures Procedures (including critical care time)  Medications Ordered in UC Medications - No data to display  Initial Impression / Assessment and Plan / UC Course  I have reviewed the triage vital signs and the nursing notes.  Pertinent labs & imaging results that were available during my care of the patient were reviewed by me and considered in my medical decision making (see chart for details).     Concern for possible stroke. Symptom onset reported by pt and wife 2AM on 08/12/19.  Agree with neurology recommendation of further evaluation in emergency department Pt declined EMS transport but is agreeable to have his wife drive him to [facility] for further evaluation. Pt discharged in stable condition. AVS given  Final Clinical Impressions(s) / UC Diagnoses   Final diagnoses:  Unequal pupils  Confusion after a seizure  Right hip pain     Discharge Instructions      You have declined EMS transport, however, it is still recommended you be evaluated further in the emergency department for a possible stroke.  Please have your wife drive you directly to the hospital for further evaluation and treatment.     ED Prescriptions    None     PDMP not reviewed this encounter.   Noe Gens, [place] 08/13/19 [phone]

## 2019-08-13 NOTE — Telephone Encounter (Signed)
The patient's wife, Otila Kluver, called at 6 7 AM this morning informing me that her husband has had seizure about 2 days ago around 2 AM and has not completely recovered yet.  She described word finding difficulties and repeated movements, as if he tries to play a charade with her. I think is very unlikely that a 36-hour to 48-hour post victim exist for this patient and for this reason I asked her to bring him to the emergency room urgently.

## 2019-08-13 NOTE — Telephone Encounter (Signed)
Noted, agree

## 2019-08-13 NOTE — ED Triage Notes (Signed)
Pt c/o seizure yesterday morning at 2am. Says he has a hx. Says that's when he hurt his RT hip. Also has hip problems and had an injection about 2 weeks ago. Wife called neuro and PCP who recommended he get checked out.

## 2019-08-13 NOTE — Telephone Encounter (Signed)
Pt has apt scheduled 7/29. Will note this for Dr Brett Fairy so they can discuss at 4 wk follow up.

## 2019-08-13 NOTE — Discharge Instructions (Signed)
  You have declined EMS transport, however, it is still recommended you be evaluated further in the emergency department for a possible stroke.  Please have your wife drive you directly to the hospital for further evaluation and treatment.

## 2019-08-13 NOTE — ED Triage Notes (Addendum)
Patient states that he had seizure yesterday am and wife thinks not acting right. Patient also complains of right hip pain. History of same and taking meds daily. Patient alert and oriented and thinks post ictal state different. Reports seizure every 3-4 months. No neuro deficits

## 2019-08-13 NOTE — ED Provider Notes (Signed)
[facility] EMERGENCY DEPARTMENT Provider Note  CSN: [id number] Arrival date & time: 08/13/19 0946    History Chief Complaint  Patient presents with  . Altered Mental Status    HPI  Vincent David is a 54 y.o. male with a history of seizure who was brought to the ED by his wife for evaluation of confusion. The patient had a reported seizure around 2am on 7/7 (about 36 hours ago). He typically has some confusion post-seizure but was still not acting his normal self this morning and so wife called Neurology and was advised to go to the ER for evaluation. She initially took him to UC but was then directed to the ED. At the time of my evaluation he states he not longer feels confused and is back to baseline. He is complaining of hip/back pain which is also chronic for him. He was recently started on Lamictal and had his Keppra weaned off. He is seeing Dr. Dwana Melena for his hip pain, has had injection on 6/30 with minimal improvement.    Past Medical History:  Diagnosis Date  . Postictal headache   . Seizure ([place])   . Sleep apnea   . Tinnitus     Past Surgical History:  Procedure Laterality Date  . KNEE SURGERY Left   . SHOULDER SURGERY Left     Family History  Problem Relation Age of Onset  . Sleep apnea Mother   . Diabetes Mother   . High blood pressure Father     Social History   Tobacco Use  . Smoking status: Current Every Day Smoker    Packs/day: 0.50    Types: Cigarettes  . Smokeless tobacco: Never Used  Vaping Use  . Vaping Use: Never used  Substance Use Topics  . Alcohol use: Yes    Alcohol/week: 4.0 standard drinks    Types: 4 Standard drinks or equivalent per week  . Drug use: Never     Home Medications Prior to Admission medications   Medication Sig Start Date End Date Taking? Authorizing Provider  ibuprofen (ADVIL) 200 MG tablet Take 4 tablets (800 mg total) by mouth every 6 (six) hours as needed. Patient taking differently: Take 800 mg by mouth 2  (two) times daily as needed for mild pain or moderate pain.  05/22/19  Yes Silverio Decamp, MD  lamoTRIgine (LAMICTAL) 25 MG tablet Week one 25 mg at night po for 7 days. Week two 25 mg bid po for 7 days. Week three and four 50 mg bid for 14 days and RV with provider. Keppra should have been discontinued by week 4. Patient taking differently: Take 50 mg by mouth 2 (two) times daily. Week one 25 mg at night po for 7 days. Week two 25 mg bid po for 7 days. Week three and four 50 mg bid for 14 days and RV with provider. Keppra should have been discontinued by week 4. 07/17/19  Yes Dohmeier, Asencion Partridge, MD     Allergies    Patient has no known allergies.   Review of Systems   Review of Systems A comprehensive review of systems was completed and negative except as noted in HPI.    Physical Exam BP (!) 151/80 (BP Location: Right Arm)   Pulse 92   Temp 98.7 F (37.1 C) (Axillary)   Resp 16   Ht 5\' 7"  (1.702 m)   Wt 103.4 kg   SpO2 98%   BMI 35.71 kg/m   Physical Exam Vitals and nursing  note reviewed.  Constitutional:      Appearance: Normal appearance.  HENT:     Head: Normocephalic and atraumatic.     Nose: Nose normal.     Mouth/Throat:     Mouth: Mucous membranes are moist.  Eyes:     Extraocular Movements: Extraocular movements intact.     Conjunctiva/sclera: Conjunctivae normal.  Cardiovascular:     Rate and Rhythm: Normal rate.  Pulmonary:     Effort: Pulmonary effort is normal.     Breath sounds: Normal breath sounds.  Abdominal:     General: Abdomen is flat.     Palpations: Abdomen is soft.     Tenderness: There is no abdominal tenderness.  Musculoskeletal:        General: No swelling. Normal range of motion.     Cervical back: Neck supple.  Skin:    General: Skin is warm and dry.  Neurological:     General: No focal deficit present.     Mental Status: He is alert and oriented to person, place, and time. Mental status is at baseline.     Cranial Nerves: No  cranial nerve deficit.     Sensory: No sensory deficit.     Motor: No weakness.     Coordination: Coordination normal.  Psychiatric:        Mood and Affect: Mood normal.      ED Results / Procedures / Treatments   Labs (all labs ordered are listed, but only abnormal results are displayed) Labs Reviewed - No data to display  EKG None  Radiology No results found.  Procedures Procedures  Medications Ordered in the ED Medications - No data to display   MDM Rules/Calculators/A&P MDM Patient with reportedly long post-ictal phase after recent seizure is now back to baseline. Wife in room agrees he is acting his normal self now. He has no focal neuro deficits. Only complaining of hip pain. Offered pain medications here but declines. He admits to EtOH use as well, counseled to stop drinking alcohol as this could contribute to his seizures and make mental status evaluations difficult. I offered an ED medical workup, but patient and wife decline. Continue outpatient management.  ED Course  I have reviewed the triage vital signs and the nursing notes.  Pertinent labs & imaging results that were available during my care of the patient were reviewed by me and considered in my medical decision making (see chart for details).     Final Clinical Impression(s) / ED Diagnoses Final diagnoses:  Post-ictal confusion  Chronic pain of right hip    Rx / DC Orders ED Discharge Orders    None       Truddie Hidden, MD 08/13/19 1419

## 2019-08-13 NOTE — ED Notes (Signed)
Patient is being discharged from the Urgent Care and sent to the Emergency Department via POV driven by wife . Per Leeroy Cha, PAC, patient is in need of higher level of care due to needing further evaluation of possible stroke. Patient is aware and verbalizes understanding of plan of care.  Vitals:   08/13/19 0859  BP: (!) 184/75  Pulse: (!) 109  Resp: 16  Temp: 98.2 F (36.8 C)  SpO2: 96%

## 2019-08-18 ENCOUNTER — Other Ambulatory Visit: Payer: Self-pay

## 2019-08-18 ENCOUNTER — Encounter: Payer: Self-pay | Admitting: Sports Medicine

## 2019-08-18 ENCOUNTER — Ambulatory Visit (INDEPENDENT_AMBULATORY_CARE_PROVIDER_SITE_OTHER): Payer: BC Managed Care – PPO | Admitting: Sports Medicine

## 2019-08-18 DIAGNOSIS — M25551 Pain in right hip: Secondary | ICD-10-CM | POA: Diagnosis not present

## 2019-08-18 MED ORDER — HYDROCODONE-ACETAMINOPHEN 5-325 MG PO TABS: 1.0000 | ORAL_TABLET | Freq: Three times a day (TID) | ORAL | 0 refills | Status: DC | PRN

## 2019-08-18 NOTE — Assessment & Plan Note (Signed)
[name] returns, he has a history of generalized tonic-clonic seizures, he has now had a couple of seizures over the past few months. He has not responded to conservative measures, at the last visit I injected his right hip. He tells me he had no relief, not even temporary. He then recently had another seizure that resulted in a recurrence and worsening of hip pain. X-rays historically have shown hip osteoarthritis and significant lumbar spondylosis. We are going to go ahead and get a hip MRI without contrast. Adding hydrocodone for pain, he really needs to keep close follow-up with his neurologist considering his recurrent seizures.

## 2019-08-18 NOTE — Progress Notes (Signed)
    Procedures performed today:    None.  Independent interpretation of notes and tests performed by another provider:   None.  Brief History, Exam, Impression, and Recommendations:    Chronic right hip and back pain [name] returns, he has a history of generalized tonic-clonic seizures, he has now had a couple of seizures over the past few months. He has not responded to conservative measures, at the last visit I injected his right hip. He tells me he had no relief, not even temporary. He then recently had another seizure that resulted in a recurrence and worsening of hip pain. X-rays historically have shown hip osteoarthritis and significant lumbar spondylosis. We are going to go ahead and get a hip MRI without contrast. Adding hydrocodone for pain, he really needs to keep close follow-up with his neurologist considering his recurrent seizures.    ___________________________________________ Gwen Her. Dianah Field, M.D., ABFM., CAQSM. Primary Care and [place] Instructor of [place] [employer] [facility]

## 2019-08-22 ENCOUNTER — Ambulatory Visit (INDEPENDENT_AMBULATORY_CARE_PROVIDER_SITE_OTHER): Payer: BC Managed Care – PPO

## 2019-08-22 ENCOUNTER — Other Ambulatory Visit: Payer: Self-pay

## 2019-08-22 DIAGNOSIS — G8929 Other chronic pain: Secondary | ICD-10-CM | POA: Diagnosis not present

## 2019-08-22 DIAGNOSIS — M25551 Pain in right hip: Secondary | ICD-10-CM

## 2019-08-22 DIAGNOSIS — R600 Localized edema: Secondary | ICD-10-CM | POA: Diagnosis not present

## 2019-08-22 DIAGNOSIS — M25451 Effusion, right hip: Secondary | ICD-10-CM | POA: Diagnosis not present

## 2019-08-22 DIAGNOSIS — M898X8 Other specified disorders of bone, other site: Secondary | ICD-10-CM | POA: Diagnosis not present

## 2019-08-24 ENCOUNTER — Telehealth: Payer: Self-pay

## 2019-08-24 DIAGNOSIS — R2241 Localized swelling, mass and lump, right lower limb: Secondary | ICD-10-CM

## 2019-08-24 MED ORDER — OXYCODONE-ACETAMINOPHEN 10-325 MG PO TABS: 1.0000 | ORAL_TABLET | Freq: Three times a day (TID) | ORAL | 0 refills | Status: DC | PRN

## 2019-08-24 NOTE — Addendum Note (Signed)
Addended by: Silverio Decamp on: 08/24/2019 04:58 PM   Modules accepted: Orders

## 2019-08-24 NOTE — Telephone Encounter (Signed)
[facility] called with the following message for provider -   MRI - right hip showed several expansile destructive osseous lesions compatible with skeletal metastatic disease or myeloma.

## 2019-08-24 NOTE — Telephone Encounter (Signed)
We had a chance to review Vincent David's MRI, he does have a large mass that appears to be malignant from his right ilium, there is another mass in his L5 vertebrae as well as his left hip. I had a long discussion with radiology, this is likely a metastatic cancer from the lung, he is a smoker, but certainly may need a full staging work-up and tissue diagnosis. His pain is not well controlled on 5 mg of hydrocodone so increasing to Percocet 10, referral to heme-onc, I would like a chest x-ray, he can do this tomorrow before his follow-up appointment with me.

## 2019-08-24 NOTE — Assessment & Plan Note (Signed)
We had a chance to review Vincent David's MRI, he does have a large mass that appears to be malignant from his right ilium, there is another mass in his L5 vertebrae as well as his left hip. I had a long discussion with radiology, this is likely a metastatic cancer from the lung, he is a smoker, but certainly may need a full staging work-up and tissue diagnosis. His pain is not well controlled on 5 mg of hydrocodone so increasing to Percocet 10, referral to heme-onc, I would like a chest x-ray, he can do this tomorrow before his follow-up appointment with me.

## 2019-08-25 ENCOUNTER — Ambulatory Visit (INDEPENDENT_AMBULATORY_CARE_PROVIDER_SITE_OTHER): Payer: BC Managed Care – PPO

## 2019-08-25 ENCOUNTER — Other Ambulatory Visit: Payer: Self-pay

## 2019-08-25 ENCOUNTER — Encounter: Payer: Self-pay | Admitting: *Deleted

## 2019-08-25 ENCOUNTER — Ambulatory Visit (INDEPENDENT_AMBULATORY_CARE_PROVIDER_SITE_OTHER): Payer: BC Managed Care – PPO | Admitting: Sports Medicine

## 2019-08-25 DIAGNOSIS — R2241 Localized swelling, mass and lump, right lower limb: Secondary | ICD-10-CM

## 2019-08-25 DIAGNOSIS — R918 Other nonspecific abnormal finding of lung field: Secondary | ICD-10-CM | POA: Diagnosis not present

## 2019-08-25 DIAGNOSIS — F17200 Nicotine dependence, unspecified, uncomplicated: Secondary | ICD-10-CM | POA: Diagnosis not present

## 2019-08-25 DIAGNOSIS — M898X8 Other specified disorders of bone, other site: Secondary | ICD-10-CM | POA: Diagnosis not present

## 2019-08-25 MED ORDER — CHANTIX STARTING MONTH PAK 0.5 MG X 11 & 1 MG X 42 PO TABS: ORAL_TABLET | ORAL | 0 refills | Status: DC

## 2019-08-25 NOTE — Progress Notes (Signed)
Reached out to [employer] to introduce myself as the office RN Navigator and explain our new patient process. Reviewed the reason for their referral and scheduled their new patient appointment along with labs. Provided address and directions to the office including call back phone number.

## 2019-08-25 NOTE — Assessment & Plan Note (Signed)
Starting Chantix, further follow-up of this with PCP.

## 2019-08-25 NOTE — Progress Notes (Signed)
    Procedures performed today:    None.  Independent interpretation of notes and tests performed by another provider:   None.  Brief History, Exam, Impression, and Recommendations:    Mass of hip region, right This is a pleasant 54 year old male, we have diagnosed metastatic cancer in his right ilium, vertebrae, left hip. Chest x-ray shows what appears to be an expansile lesion in his seventh rib. Unclear primary, no evidence of a prostate mass on his hip MRI and recent PSA was normal. He had no masses on exam in his breast, thyroid. No constitutional symptoms. He does have an appointment with Dr. Marin Olp coming up. I am going to go ahead and get some labs including a CBC, CMP, coags, as well as urine protein electrophoresis. I am also going to pull the trigger for a PET CT scan. Additional follow-up and really needs to be with his PCP.   Smoker Starting Chantix, further follow-up of this with PCP.  I spent 40 minutes of total time managing this patient today, this includes chart review, face to face, and non-face to face time.   ___________________________________________ Gwen Her. Dianah Field, M.D., ABFM., CAQSM. Primary Care and [place] Instructor of [place] [facility]

## 2019-08-25 NOTE — Assessment & Plan Note (Signed)
This is a pleasant 54 year old male, we have diagnosed metastatic cancer in his right ilium, vertebrae, left hip. Chest x-ray shows what appears to be an expansile lesion in his seventh rib. Unclear primary, no evidence of a prostate mass on his hip MRI and recent PSA was normal. He had no masses on exam in his breast, thyroid. No constitutional symptoms. He does have an appointment with Dr. Marin Olp coming up. I am going to go ahead and get some labs including a CBC, CMP, coags, as well as urine protein electrophoresis. I am also going to pull the trigger for a PET CT scan. Additional follow-up and really needs to be with his PCP.

## 2019-08-26 ENCOUNTER — Other Ambulatory Visit: Payer: Self-pay | Admitting: Sports Medicine

## 2019-08-26 ENCOUNTER — Telehealth: Payer: Self-pay | Admitting: Sports Medicine

## 2019-08-26 MED ORDER — CHANTIX STARTING MONTH PAK 0.5 MG X 11 & 1 MG X 42 PO TABS: ORAL_TABLET | ORAL | 0 refills | Status: DC

## 2019-08-26 NOTE — Telephone Encounter (Signed)
Noted masses in hip, spine, suspect metastatic cancer, disability paperwork filled out today, further management of medical issues per PCP.

## 2019-08-27 ENCOUNTER — Telehealth: Payer: Self-pay | Admitting: *Deleted

## 2019-08-27 LAB — PROTEIN ELECTROPHORESIS,RANDOM URN
Creatinine, Urine: 98 mg/dL (ref 20–320)
Protein/Creat Ratio: 112 mg/g creat (ref 22–128)
Protein/Creatinine Ratio: 0.112 mg/mg creat (ref 0.022–0.12)
Total Protein, Urine: 11 mg/dL (ref 5–25)

## 2019-08-27 LAB — COMPLETE METABOLIC PANEL WITH GFR
AG Ratio: 2 (calc) (ref 1.0–2.5)
ALT: 18 U/L (ref 9–46)
AST: 13 U/L (ref 10–35)
Albumin: 4.3 g/dL (ref 3.6–5.1)
Alkaline phosphatase (APISO): 149 U/L — ABNORMAL HIGH (ref 35–144)
BUN: 15 mg/dL (ref 7–25)
CO2: 25 mmol/L (ref 20–32)
Calcium: 9.8 mg/dL (ref 8.6–10.3)
Chloride: 103 mmol/L (ref 98–110)
Creat: 1.15 mg/dL (ref 0.70–1.33)
GFR, Est African American: 83 mL/min/{1.73_m2} (ref >=60)
GFR, Est Non African American: 71 mL/min/{1.73_m2} (ref >=60)
Globulin: 2.2 g/dL (calc) (ref 1.9–3.7)
Glucose, Bld: 97 mg/dL (ref 65–139)
Potassium: 4.6 mmol/L (ref 3.5–5.3)
Sodium: 140 mmol/L (ref 135–146)
Total Bilirubin: 0.3 mg/dL (ref 0.2–1.2)
Total Protein: 6.5 g/dL (ref 6.1–8.1)

## 2019-08-27 LAB — PROTIME-INR
INR: 1
Prothrombin Time: 10.4 s (ref 9.0–11.5)

## 2019-08-27 LAB — CBC
HCT: 47.2 % (ref 38.5–50.0)
Hemoglobin: 15.5 g/dL (ref 13.2–17.1)
MCH: 30.4 pg (ref 27.0–33.0)
MCHC: 32.8 g/dL (ref 32.0–36.0)
MCV: 92.5 fL (ref 80.0–100.0)
MPV: 9.9 fL (ref 7.5–12.5)
Platelets: 272 10*3/uL (ref 140–400)
RBC: 5.1 10*6/uL (ref 4.20–5.80)
RDW: 12.8 % (ref 11.0–15.0)
WBC: 9.9 10*3/uL (ref 3.8–10.8)

## 2019-08-27 LAB — PROTEIN ELECTROPHORESIS, SERUM, WITH REFLEX
Albumin ELP: 4 g/dL (ref 3.8–4.8)
Alpha 1: 0.5 g/dL — ABNORMAL HIGH (ref 0.2–0.3)
Alpha 2: 1 g/dL — ABNORMAL HIGH (ref 0.5–0.9)
Beta 2: 0.3 g/dL (ref 0.2–0.5)
Beta Globulin: 0.4 g/dL (ref 0.4–0.6)
Gamma Globulin: 0.5 g/dL — ABNORMAL LOW (ref 0.8–1.7)
Total Protein: 6.7 g/dL (ref 6.1–8.1)

## 2019-08-27 LAB — PSA, TOTAL AND FREE
PSA, % Free: 60 % (calc) (ref >25)
PSA, Free: 0.3 ng/mL
PSA, Total: 0.5 ng/mL (ref <=4.0)

## 2019-08-27 LAB — APTT: aPTT: 31 s (ref 23–32)

## 2019-08-27 NOTE — Telephone Encounter (Signed)
Pt's wife left vm letting us know that the pharmacy never received their prescriptions from you for Chantix.  I called their pharmacy and found out that Chantix has been discontinued and they are no longer able to get it.  Pt's wife notified and wanted to know if you would send them both in some nicotine patches.  Wife is Bronc Brosseau 07[birth year]09[birth year]64.

## 2019-08-28 ENCOUNTER — Encounter: Payer: Self-pay | Admitting: *Deleted

## 2019-08-28 ENCOUNTER — Inpatient Hospital Stay: Payer: BC Managed Care – PPO | Attending: Hematology & Oncology

## 2019-08-28 ENCOUNTER — Other Ambulatory Visit: Payer: Self-pay

## 2019-08-28 ENCOUNTER — Encounter: Payer: Self-pay | Admitting: Hematology & Oncology

## 2019-08-28 ENCOUNTER — Inpatient Hospital Stay (HOSPITAL_BASED_OUTPATIENT_CLINIC_OR_DEPARTMENT_OTHER): Payer: BC Managed Care – PPO | Admitting: Hematology & Oncology

## 2019-08-28 ENCOUNTER — Encounter (HOSPITAL_COMMUNITY): Payer: Self-pay | Admitting: Radiology

## 2019-08-28 VITALS — BP 159/73 | HR 86 | Temp 99.0°F | Resp 20 | Ht 67.0 in | Wt 226.0 lb

## 2019-08-28 DIAGNOSIS — R2241 Localized swelling, mass and lump, right lower limb: Secondary | ICD-10-CM | POA: Insufficient documentation

## 2019-08-28 DIAGNOSIS — Z79899 Other long term (current) drug therapy: Secondary | ICD-10-CM | POA: Diagnosis not present

## 2019-08-28 DIAGNOSIS — Z7189 Other specified counseling: Secondary | ICD-10-CM | POA: Diagnosis not present

## 2019-08-28 DIAGNOSIS — H9319 Tinnitus, unspecified ear: Secondary | ICD-10-CM | POA: Insufficient documentation

## 2019-08-28 DIAGNOSIS — F17210 Nicotine dependence, cigarettes, uncomplicated: Secondary | ICD-10-CM | POA: Insufficient documentation

## 2019-08-28 DIAGNOSIS — G40909 Epilepsy, unspecified, not intractable, without status epilepticus: Secondary | ICD-10-CM | POA: Diagnosis not present

## 2019-08-28 DIAGNOSIS — G4730 Sleep apnea, unspecified: Secondary | ICD-10-CM | POA: Diagnosis not present

## 2019-08-28 HISTORY — DX: Other specified counseling: Z71.89

## 2019-08-28 LAB — CBC WITH DIFFERENTIAL (CANCER CENTER ONLY)
Abs Immature Granulocytes: 0.06 10*3/uL (ref 0.00–0.07)
Basophils Absolute: 0.1 10*3/uL (ref 0.0–0.1)
Basophils Relative: 1 %
Eosinophils Absolute: 0.3 10*3/uL (ref 0.0–0.5)
Eosinophils Relative: 3 %
HCT: 46.8 % (ref 39.0–52.0)
Hemoglobin: 15.6 g/dL (ref 13.0–17.0)
Immature Granulocytes: 1 %
Lymphocytes Relative: 22 %
Lymphs Abs: 1.9 10*3/uL (ref 0.7–4.0)
MCH: 30.9 pg (ref 26.0–34.0)
MCHC: 33.3 g/dL (ref 30.0–36.0)
MCV: 92.7 fL (ref 80.0–100.0)
Monocytes Absolute: 0.5 10*3/uL (ref 0.1–1.0)
Monocytes Relative: 5 %
Neutro Abs: 5.8 10*3/uL (ref 1.7–7.7)
Neutrophils Relative %: 68 %
Platelet Count: 246 10*3/uL (ref 150–400)
RBC: 5.05 MIL/uL (ref 4.22–5.81)
RDW: 13.2 % (ref 11.5–15.5)
WBC Count: 8.5 10*3/uL (ref 4.0–10.5)
nRBC: 0 % (ref 0.0–0.2)

## 2019-08-28 LAB — CMP (CANCER CENTER ONLY)
ALT: 17 U/L (ref 0–44)
AST: 13 U/L — ABNORMAL LOW (ref 15–41)
Albumin: 4.8 g/dL (ref 3.5–5.0)
Alkaline Phosphatase: 142 U/L — ABNORMAL HIGH (ref 38–126)
Anion gap: 9 (ref 5–15)
BUN: 18 mg/dL (ref 6–20)
CO2: 28 mmol/L (ref 22–32)
Calcium: 11.1 mg/dL — ABNORMAL HIGH (ref 8.9–10.3)
Chloride: 99 mmol/L (ref 98–111)
Creatinine: 1.06 mg/dL (ref 0.61–1.24)
GFR, Est AFR Am: >60 mL/min (ref >60)
GFR, Estimated: >60 mL/min (ref >60)
Glucose, Bld: 110 mg/dL — ABNORMAL HIGH (ref 70–99)
Potassium: 4.3 mmol/L (ref 3.5–5.1)
Sodium: 136 mmol/L (ref 135–145)
Total Bilirubin: 0.4 mg/dL (ref 0.3–1.2)
Total Protein: 7.5 g/dL (ref 6.5–8.1)

## 2019-08-28 LAB — LACTATE DEHYDROGENASE: LDH: 144 U/L (ref 98–192)

## 2019-08-28 LAB — CEA (IN HOUSE-CHCC): CEA (CHCC-In House): 3.28 ng/mL (ref 0.00–5.00)

## 2019-08-28 MED ORDER — NICOTINE 7 MG/24HR TD PT24: 7.0000 mg | MEDICATED_PATCH | Freq: Every day | TRANSDERMAL | 0 refills | Status: DC

## 2019-08-28 NOTE — Progress Notes (Signed)
Referral MD  Reason for Referral: Right hip mass and L5 vertebral body mass.  Chief Complaint  Patient presents with  . New Patient (Initial Visit)    "masses in right hip and back"  : I have a tumor my right hip.  HPI: Vincent David is a very nice 54 year old white male.  He is originally from [facility].  He has lived in different places.  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  He has been quite healthy.  He plays golf.  He does have this history of seizures.  This is been about 10 years.  He is on Lamictal for this.  He was playing golf recently.  He began to have pain in the right hip.  He saw his family doctor.  He had a steroid injection which really not help.  Ultimately, he underwent a MRI of the right hip on 08/22/2019.  Surprisingly, this showed a large tumor mass measuring 8 x 6.4 x 7.1 cm.  There was extensive cortical breakthrough and destruction of the bone.  This was in the right iliac bone and upper acetabulum.  There is also a lytic lesion in the right L5 vertebral body and right transverse process measuring 5.3 x 2.2 x 2.7 cm.  There is no cord compression.  There is some lytic intertrochanteric areas in the left proximal femur.  He said that he had a chest x-ray which did not show anything obvious.  He was then kindly referred to the [facility] for evaluation.  He has not lost weight.  He has had no fever.  There has been no change in bowel or bladder habits.  He has had no leg weakness.  He has had no rashes.  He has had no problem with infections.   He does smoke.  He smoked a pack per day.  He really does not drink alcohol.  There is no history of family with malignancy.  He has had no headache.  Again he does have the seizure disorder.  Currently, I would say his performance status is ECOG 1.    Past Medical History:  Diagnosis Date  . Postictal headache   . Seizure ([place])   . Sleep apnea   . Tinnitus   :  Past Surgical  History:  Procedure Laterality Date  . KNEE SURGERY Left   . SHOULDER SURGERY Left   :   Current Outpatient Medications:  .  lamoTRIgine (LAMICTAL) 25 MG tablet, Week one 25 mg at night po for 7 days. Week two 25 mg bid po for 7 days. Week three and four 50 mg bid for 14 days and RV with provider. Keppra should have been discontinued by week 4. (Patient taking differently: Take 50 mg by mouth 2 (two) times daily. Week one 25 mg at night po for 7 days. Week two 25 mg bid po for 7 days. Week three and four 50 mg bid for 14 days and RV with provider. Keppra should have been discontinued by week 4.), Disp: 240 tablet, Rfl: 3 .  oxyCODONE-acetaminophen (PERCOCET) 10-325 MG tablet, Take 1 tablet by mouth every 8 (eight) hours as needed for pain., Disp: 40 tablet, Rfl: 0 .  ibuprofen (ADVIL) 200 MG tablet, Take 4 tablets (800 mg total) by mouth every 6 (six) hours as needed. (Patient not taking: Reported on 08/28/2019), Disp: , Rfl:  .  nicotine (NICODERM CQ - DOSED IN MG/24 HR) 7 mg/24hr patch, Place 1 patch (7 mg  total) onto the skin daily., Disp: 28 patch, Rfl: 0:  :  No Known Allergies:  Family History  Problem Relation Age of Onset  . Sleep apnea Mother   . Diabetes Mother   . High blood pressure Father   :  Social History   Socioeconomic History  . Marital status: Married    Spouse name: Otila Kluver  . Number of children: 3  . Years of education: Not on file  . Highest education level: Not on file  Occupational History    Employer: OTHER  Tobacco Use  . Smoking status: Current Every Day Smoker    Packs/day: 0.50    Types: Cigarettes  . Smokeless tobacco: Never Used  Vaping Use  . Vaping Use: Never used  Substance and Sexual Activity  . Alcohol use: Yes    Alcohol/week: 4.0 standard drinks    Types: 4 Standard drinks or equivalent per week  . Drug use: Never  . Sexual activity: Yes    Partners: Female  Other Topics Concern  . Not on file  Social History Narrative  . Not on  file   Social Determinants of Health   Financial Resource Strain:   . Difficulty of Paying Living Expenses:   Food Insecurity:   . Worried About Charity fundraiser in the Last Year:   . Arboriculturist in the Last Year:   Transportation Needs:   . Film/video editor (Medical):   [name] Lack of Transportation (Non-Medical):   Physical Activity:   . Days of Exercise per Week:   . Minutes of Exercise per Session:   Stress:   . Feeling of Stress :   Social Connections:   . Frequency of Communication with Friends and Family:   . Frequency of Social Gatherings with Friends and Family:   . Attends Religious Services:   . Active Member of Clubs or Organizations:   . Attends Archivist Meetings:   [name] Marital Status:   Intimate Partner Violence:   . Fear of Current or Ex-Partner:   . Emotionally Abused:   [name] Physically Abused:   . Sexually Abused:   :  Review of Systems  Constitutional: Negative.   HENT: Negative.   Eyes: Negative.   Respiratory: Negative.   Cardiovascular: Negative.   Gastrointestinal: Negative.   Genitourinary: Negative.   Musculoskeletal: Positive for joint pain.  Skin: Negative.   Neurological: Positive for focal weakness and seizures.  Endo/Heme/Allergies: Negative.   Psychiatric/Behavioral: Negative.      Exam:  This is a well-developed and well-nourished white male in no obvious distress.  Vital signs show a temperature of 99.  Pulse 86.  Blood pressure 163/95.  Weight is 226 pounds.  Head and neck exam shows no ocular or oral lesions.  He has no probable cervical or supraclavicular lymph nodes.  Lungs are clear bilaterally.  Cardiac exam regular rate and rhythm with normal S1 and S2.  He has no murmurs, rubs or bruits.  Abdomen is soft.  He is mildly obese.  He has good bowel sounds.  There is no fluid wave.  There is no palpable liver or spleen tip.  Back exam shows no tenderness over the spine or ribs.  He does have some discomfort with motion  of the right hip.  Extremities shows no clubbing, cyanosis or edema.  He has good range of motion of his joints.  Has good strength in his extremities.  Neurological exam shows no focal neurological deficits.  Skin exam shows no rashes, ecchymoses or petechia.   @IPVITALS @   Recent Labs    08/25/19 1450 08/28/19 0824  WBC 9.9 8.5  HGB 15.5 15.6  HCT 47.2 46.8  PLT 272 246   Recent Labs    08/25/19 1450 08/28/19 0824  NA 140 136  K 4.6 4.3  CL 103 99  CO2 25 28  GLUCOSE 97 110*  BUN 15 18  CREATININE 1.15 1.06  CALCIUM 9.8 11.1*    Blood smear review: None  Pathology: Pending    Assessment and Plan: Mr. Ottey is a very nice 54 year old white male.  He certainly has a tough problem.  He clearly will need radiation therapy initially because of his bone disease.  Again I have to get an biopsy.  A biopsy will show [place] what is going on.  I think the best diagnosis would be a lymphoproliferative disease, such as non-Hodgkin's lymphoma or myeloma.  Given his tobacco use, I would have to worry about lung cancer.  Again he says he had a chest x-ray which was negative.  However, there could be an occult lesion not seen on the chest x-ray.  A sarcoma is also a possibility.  I would think melanoma would be unlikely although this is always possible.  Again, the biopsy will tell us what is going on.  We will get a PET scan on him.  We will see what the labs show.  It will be interesting to see what his immunoglobulins and SPEP show.  He came in with his wife.  They are both very very nice.  I spent about an hour with them.  I went over my recommendations.  I answered their questions.  We will try to get everything done next week and hopefully have a diagnosis.  We will then get him back quickly so we can plan for treatment, both locally to the bone lesions and also systemically.  He will also need Xgeva given that he has these bone lesions.

## 2019-08-28 NOTE — Telephone Encounter (Signed)
Done

## 2019-08-28 NOTE — Progress Notes (Signed)
Initial RN Navigator Patient Visit  Name: Vincent David Date of Referral : 08/24/19 Diagnosis: Hip Mass  Met with patient prior to their visit with MD. Introduced myself and explained my role. Reviewed with patient the general overview of expected course after initial diagnosis and time frame for all steps to be completed.  Patient has already been scheduled for PET scan on 09/02/19. He will also need a CT guided biopsy. Message sent to biopsy scheduling requesting an appointment for next week.   Patient understands all follow up procedures and expectations. They have my number to reach out for any further clarification or additional needs.

## 2019-08-28 NOTE — Progress Notes (Signed)
Willliam Figler Male, 54 y.o., Jan 25, 1966 MRN:  [id number] Phone:  [phone] Jerilynn Mages) PCP:  Emeterio Reeve, DO Coverage:  Sherre Poot Blue Shield/[employer] Comm Ppo Next Appt With Radiology ([facility] PET) 09/02/2019 at 12:00 PM  RE: STAT   CT Biopsy Received: Today Markus Daft, MD  Arlyn Leak for CT guided biopsy of right iliac bone mass.   [facility]       Previous Messages   ----- Message -----  From: Garth Bigness D  Sent: 08/28/2019  2:30 PM EDT  To: Ir Procedure Requests  Subject: STAT   CT Biopsy                Procedure: CT Biopsy   Reason: Mass of hip region, right, large mass in the RIGHT hip. Need multiple bx's for molecular studies   History: MR, DG in computer, NM PET scheduled for 09/02/19   Provider: Volanda Napoleon   Provider Contact: [phone]

## 2019-08-29 LAB — IGG, IGA, IGM
IgA: 131 mg/dL (ref 90–386)
IgG (Immunoglobin G), Serum: 553 mg/dL — ABNORMAL LOW (ref 603–1613)
IgM (Immunoglobulin M), Srm: 31 mg/dL (ref 20–172)

## 2019-08-31 ENCOUNTER — Telehealth: Payer: Self-pay | Admitting: Hematology & Oncology

## 2019-08-31 LAB — KAPPA/LAMBDA LIGHT CHAINS
Kappa free light chain: 15.4 mg/L (ref 3.3–19.4)
Kappa, lambda light chain ratio: 1.21 (ref 0.26–1.65)
Lambda free light chains: 12.7 mg/L (ref 5.7–26.3)

## 2019-08-31 LAB — PROTEIN ELECTROPHORESIS, SERUM, WITH REFLEX
A/G Ratio: 1.2 (ref 0.7–1.7)
Albumin ELP: 3.7 g/dL (ref 2.9–4.4)
Alpha-1-Globulin: 0.3 g/dL (ref 0.0–0.4)
Alpha-2-Globulin: 1.1 g/dL — ABNORMAL HIGH (ref 0.4–1.0)
Beta Globulin: 1.2 g/dL (ref 0.7–1.3)
Gamma Globulin: 0.6 g/dL (ref 0.4–1.8)
Globulin, Total: 3.2 g/dL (ref 2.2–3.9)
Total Protein ELP: 6.9 g/dL (ref 6.0–8.5)

## 2019-08-31 NOTE — Telephone Encounter (Signed)
No los 7/23

## 2019-09-01 ENCOUNTER — Encounter: Payer: Self-pay | Admitting: *Deleted

## 2019-09-01 NOTE — Progress Notes (Signed)
Patient is scheduled for CT biopsy this Friday, 09/04/2019.  Received a call from his wife, Otila Kluver, this morning. She is concerned about their payment responsibility with procedures. She received a call stating their portion for the PET was over $800 and she is concerned that she will receive another phone call later this week for biopsy. She states they can pay the amounts, but she's not sure if she can pay the total amounts all at once, especially without knowing the cost for biopsy.  I called and spoke to [place] at the pre-service center. She states the preference is for full payment up front, but that the patient can pay any portion they are able to prior to the exam. The patient will be billed the remaining amount, and if they would like, they can call the billing number on the bill and request a payment plan.   All of this information relayed to [place]. She was appreciative for the information.

## 2019-09-02 ENCOUNTER — Ambulatory Visit (HOSPITAL_COMMUNITY)
Admission: RE | Admit: 2019-09-02 | Discharge: 2019-09-02 | Disposition: A | Discharge status: Home / Self Care | Payer: BC Managed Care – PPO | Source: Ambulatory Visit | Attending: Sports Medicine | Admitting: Sports Medicine

## 2019-09-02 ENCOUNTER — Other Ambulatory Visit: Payer: Self-pay

## 2019-09-02 DIAGNOSIS — N2889 Other specified disorders of kidney and ureter: Secondary | ICD-10-CM | POA: Insufficient documentation

## 2019-09-02 DIAGNOSIS — J432 Centrilobular emphysema: Secondary | ICD-10-CM | POA: Diagnosis not present

## 2019-09-02 DIAGNOSIS — R2241 Localized swelling, mass and lump, right lower limb: Secondary | ICD-10-CM | POA: Diagnosis not present

## 2019-09-02 DIAGNOSIS — K760 Fatty (change of) liver, not elsewhere classified: Secondary | ICD-10-CM | POA: Insufficient documentation

## 2019-09-02 DIAGNOSIS — I700 Atherosclerosis of aorta: Secondary | ICD-10-CM | POA: Insufficient documentation

## 2019-09-02 DIAGNOSIS — J439 Emphysema, unspecified: Secondary | ICD-10-CM | POA: Insufficient documentation

## 2019-09-02 DIAGNOSIS — C7951 Secondary malignant neoplasm of bone: Secondary | ICD-10-CM | POA: Diagnosis not present

## 2019-09-02 LAB — GLUCOSE, CAPILLARY: Glucose-Capillary: 101 mg/dL — ABNORMAL HIGH (ref 70–99)

## 2019-09-02 MED ORDER — FLUDEOXYGLUCOSE F - 18 (FDG) INJECTION
11.7300 | Freq: Once | INTRAVENOUS | Status: AC
  Administered 2019-09-02: 11.73 via INTRAVENOUS

## 2019-09-03 ENCOUNTER — Other Ambulatory Visit: Payer: Self-pay | Admitting: Radiology

## 2019-09-03 ENCOUNTER — Encounter: Payer: Self-pay | Admitting: *Deleted

## 2019-09-03 ENCOUNTER — Ambulatory Visit (INDEPENDENT_AMBULATORY_CARE_PROVIDER_SITE_OTHER): Payer: BC Managed Care – PPO | Admitting: Neurology

## 2019-09-03 ENCOUNTER — Encounter: Payer: Self-pay | Admitting: Neurology

## 2019-09-03 ENCOUNTER — Other Ambulatory Visit: Payer: Self-pay | Admitting: Hematology & Oncology

## 2019-09-03 ENCOUNTER — Ambulatory Visit: Payer: BC Managed Care – PPO | Admitting: Sports Medicine

## 2019-09-03 VITALS — BP 161/81 | HR 86 | Ht 67.0 in | Wt 224.0 lb

## 2019-09-03 DIAGNOSIS — G4733 Obstructive sleep apnea (adult) (pediatric): Secondary | ICD-10-CM | POA: Diagnosis not present

## 2019-09-03 DIAGNOSIS — C642 Malignant neoplasm of left kidney, except renal pelvis: Secondary | ICD-10-CM

## 2019-09-03 DIAGNOSIS — I77811 Abdominal aortic ectasia: Secondary | ICD-10-CM

## 2019-09-03 DIAGNOSIS — R569 Unspecified convulsions: Secondary | ICD-10-CM

## 2019-09-03 DIAGNOSIS — G40909 Epilepsy, unspecified, not intractable, without status epilepticus: Secondary | ICD-10-CM | POA: Diagnosis not present

## 2019-09-03 DIAGNOSIS — Z9989 Dependence on other enabling machines and devices: Secondary | ICD-10-CM

## 2019-09-03 MED ORDER — LAMOTRIGINE 100 MG PO TABS: 100.0000 mg | ORAL_TABLET | Freq: Every day | ORAL | 5 refills | Status: DC

## 2019-09-03 NOTE — Progress Notes (Signed)
Reviewed results of PET with Dr Marin Olp. Result lend to a primary RCC. Patient will need an MRI of the brain and an appointment to see Dr Marin Olp late next week to discuss all results and treatment plan. Biopsy is already scheduled for tomorrow.  MRI authorization obtained and patient scheduled for 09/07/2019 at [place] at [facility]   Follow up appointment with Dr Marin Olp scheduled for 09/10/2019 at 12:45  Called and spoke to [place], patient's wife. Reviewed PET scan results with her. Also explained next steps. Reviewed all appointments with her, including time, date, and location. She knows to call if she has any questions or concerns.   Oncology Nurse Navigator Documentation  Oncology Nurse Navigator Flowsheets 09/03/2019  Abnormal Finding Date -  Diagnosis Status -  Navigator Follow Up Date: 09/10/2019  Navigator Follow Up Reason: Follow-up Appointment  Navigator Location [facility]  Referral Date to RadOnc/MedOnc -  Navigator Encounter Type Diagnostic Results;Education;Follow-up Appt;Telephone  Telephone Appt Confirmation/Clarification;Diagnostic Results;Education;Outgoing Call  Patient Visit Type MedOnc  Treatment Phase Abnormal Scans  Barriers/Navigation Needs Coordination of Care;Education;Family Concerns  Education Other  Interventions Coordination of Care;Education;Psycho-Social Support  Acuity Level 3-Moderate Needs (3-4 Barriers Identified)  Referrals -  Coordination of Care Appts;Radiology  Education Method Verbal  Support Groups/Services Friends and Family  Time Spent with Patient 53

## 2019-09-03 NOTE — Patient Instructions (Signed)
Lamotrigine tablets What is this medicine? LAMOTRIGINE (la MOE tri jeen) is used to control seizures in adults and children with epilepsy and Lennox-Gastaut syndrome. It is also used in adults to treat bipolar disorder. This medicine may be used for other purposes; ask your health care provider or pharmacist if you have questions. COMMON BRAND NAME(S): Lamictal, Subvenite What should I tell my health care provider before I take this medicine? They need to know if you have any of these conditions:  aseptic meningitis during prior use of lamotrigine  depression  folate deficiency  kidney disease  liver disease  suicidal thoughts, plans, or attempt; a previous suicide attempt by you or a family member  an unusual or allergic reaction to lamotrigine or other seizure medications, other medicines, foods, dyes, or preservatives  pregnant or trying to get pregnant  breast-feeding How should I use this medicine? Take this medicine by mouth with a glass of water. Follow the directions on the prescription label. Do not chew these tablets. If this medicine upsets your stomach, take it with food or milk. Take your doses at regular intervals. Do not take your medicine more often than directed. A special MedGuide will be given to you by the pharmacist with each new prescription and refill. Be sure to read this information carefully each time. Talk to your pediatrician regarding the use of this medicine in children. While this drug may be prescribed for children as young as 2 years for selected conditions, precautions do apply. Overdosage: If you think you have taken too much of this medicine contact a poison control center or emergency room at once. NOTE: This medicine is only for you. Do not share this medicine with others. What if I miss a dose? If you miss a dose, take it as soon as you can. If it is almost time for your next dose, take only that dose. Do not take double or extra doses. What may  interact with this medicine?  atazanavir  carbamazepine  male hormones, including contraceptive or birth control pills  lopinavir  methotrexate  phenobarbital  phenytoin  primidone  pyrimethamine  rifampin  ritonavir  trimethoprim  valproic acid This list may not describe all possible interactions. Give your health care provider a list of all the medicines, herbs, non-prescription drugs, or dietary supplements you use. Also tell them if you smoke, drink alcohol, or use illegal drugs. Some items may interact with your medicine. What should I watch for while using this medicine? Visit your doctor or health care provider for regular checks on your progress. If you take this medicine for seizures, wear a Medic Alert bracelet or necklace. Carry an identification card with information about your condition, medicines, and doctor or health care provider. It is important to take this medicine exactly as directed. When first starting treatment, your dose will need to be adjusted slowly. It may take weeks or months before your dose is stable. You should contact your doctor or health care provider if your seizures get worse or if you have any new types of seizures. Do not stop taking this medicine unless instructed by your doctor or health care provider. Stopping your medicine suddenly can increase your seizures or their severity. This medicine may cause serious skin reactions. They can happen weeks to months after starting the medicine. Contact your health care provider right away if you notice fevers or flu-like symptoms with a rash. The rash may be red or purple and then turn into blisters or peeling   of the skin. Or, you might notice a red rash with swelling of the face, lips or lymph nodes in your neck or under your arms. You may get drowsy, dizzy, or have blurred vision. Do not drive, use machinery, or do anything that needs mental alertness until you know how this medicine affects you. To  reduce dizzy or fainting spells, do not sit or stand up quickly, especially if you are an older patient. Alcohol can increase drowsiness and dizziness. Avoid alcoholic drinks. If you are taking this medicine for bipolar disorder, it is important to report any changes in your mood to your doctor or health care provider. If your condition gets worse, you get mentally depressed, feel very hyperactive or manic, have difficulty sleeping, or have thoughts of hurting yourself or committing suicide, you need to get help from your health care provider right away. If you are a caregiver for someone taking this medicine for bipolar disorder, you should also report these behavioral changes right away. The use of this medicine may increase the chance of suicidal thoughts or actions. Pay special attention to how you are responding while on this medicine. Your mouth may get dry. Chewing sugarless gum or sucking hard candy, and drinking plenty of water may help. Contact your doctor if the problem does not go away or is severe. Women who become pregnant while using this medicine may enroll in the [place] Antiepileptic Drug Pregnancy Registry by calling 1-888-233-2334. This registry collects information about the safety of antiepileptic drug use during pregnancy. This medicine may cause a decrease in folic acid. You should make sure that you get enough folic acid while you are taking this medicine. Discuss the foods you eat and the vitamins you take with your health care provider. What side effects may I notice from receiving this medicine? Side effects that you should report to your doctor or health care professional as soon as possible:  allergic reactions like skin rash, itching or hives, swelling of the face, lips, or tongue  changes in vision  depressed mood  elevated mood, decreased need for sleep, racing thoughts, impulsive behavior  loss of balance or coordination  mouth sores  rash, fever, and  swollen lymph nodes  redness, blistering, peeling or loosening of the skin, including inside the mouth  right upper belly pain  seizures  severe muscle pain  signs and symptoms of aseptic meningitis such as stiff neck and sensitivity to light, headache, drowsiness, fever, nausea, vomiting, rash  signs of infection - fever or chills, cough, sore throat, pain or difficulty passing urine  suicidal thoughts or other mood changes  swollen lymph nodes  trouble walking  unusual bruising or bleeding  unusually weak or tired  yellowing of the eyes or skin Side effects that usually do not require medical attention (report to your doctor or health care professional if they continue or are bothersome):  diarrhea  dizziness  dry mouth  stuffy nose  tiredness  tremors  trouble sleeping This list may not describe all possible side effects. Call your doctor for medical advice about side effects. You may report side effects to FDA at 1-800-FDA-1088. Where should I keep my medicine? Keep out of reach of children. Store at room temperature between 15 and 30 degrees C (59 and 86 degrees F). Throw away any unused medicine after the expiration date. NOTE: This sheet is a summary. It may not cover all possible information. If you have questions about this medicine, talk to your doctor,   pharmacist, or health care provider.  2020 Elsevier/Gold Standard (2018-04-25 15:03:40)  

## 2019-09-03 NOTE — Progress Notes (Signed)
SLEEP MEDICINE CLINIC    Provider:  Larey Seat, MD  Primary Care Physician:  Emeterio Reeve, [place] [facility] [address] [place] [address]     Referring Provider: Florinda Marker [address] [place],  [place] [address]          Chief Complaint according to patient   Patient presents with:    .      Visual blurring as a prodrome to seizures? Nocturnal seizure? pt alone, rm 10. pt states having a follow up visit. Off keppra and on lamictal at 50 mg BID.  In the process of transition he sufered a seizure-on 08/11/19.he also developed hip pain and in the process of working that up was found to have a tumor-  recently diagnosed with suspected renal cancer and undergoing work up.      HISTORY OF PRESENT ILLNESS:  Vincent David is a 54 y.o. year old Caucasian male patient is seen here  on 09/03/2019 in a RV.  Vincent David had last been seen in April in this office, at the time we made a decision to wean him off Keppra and onto Lamictal as a medication of choice for his nocturnal recurrent seizure-like events.  He was admitted to the epilepsy monitoring unit from the 24th through 27 May for video EEG monitoring his Keppra had been held at the time he was exposed to hyperventilation and photic stimulation, all his seizures has had happened during sleep therefore sleep deprivation was not a planned part of his work-up.  On the contrary we wanted him to sleep.  No typical events were captured patient requested to go home on the 27th as it was difficult for him to sleep in the hospital environment and multiple times employees had entered the epilepsy monitoring room and disturb him.  The diagnosis of nonepileptic events was discussed with the patient Keppra had not substantially change the frequency and therefore a change to lamotrigine have been recommended, he had a normal EEG background rhythm of 10 Hz frontocentral region sleep spindles were noted symmetrically no EEG  changes in hyperventilation or photic stimulation no epileptiform discharges were seen during the recording in daytime or at night again the patient felt that he was on independently sleep deprived by the comings and goings on the hospital floor.  After his discharge on the 27th he then suffered a seizure in early July this was after he had already been off Keppra and on Lamictal.  He is now using 50 mg twice daily, which we can increase 200 mg twice daily and further steps however he has at this time another medical concern.  Unfortunately he was diagnosed with a malignancy a finding that occurred while he was worked up for hip pain.PET scan revealed left sided kidney cancer, spreading to lumbar and hip regions. [name]    HPI:  I have the pleasure of seeing Vincent David , a *right -handed White or Caucasian male with a possible sleep disorder. He  has a past medical history of Goals of care, counseling/discussion (08/28/2019), Postictal headache, Seizure ([facility]), Sleep apnea, and Tinnitus. HTN, nocturnal injuries with seizures.    The patient had a seizure history - blurred vision seems to be a prodrome- days and hours before a seizure follows.  I have seen Vincent David in 06-23-2018 in a video consultation, he has since seen [name] on 02-09-2019.  History of Present Illness: 06-23-2018 HPI:  Vincent David is a  54 y.o. male , seen here  in a referral  from Dr. Marin Comment for evaluation of OSA and nocturnal seizures -  4 phone messages over the last 8 month indicating he seized that night/ 04-14-2018, , 01/14/2018, 10/10/ 2019 , and 07/18/2017. He reports not being as bothered by it as he wouldn't like to increase any medication dose- the side effects of higher medication doses made him nauseated, moody and irritable.   He reports being in pain from injuries he has suffered in nightly seizure.  This is 2 years had called the office and informed us that the patient had another nocturnal seizure on Monday, 22 March 21 and  he had been compliant with his Keppra 1000 mg in the morning 1500 mg at night.  There was no illness no fever no sleep deprivation no other triggers that the patient could identify.  Just the feeling of blurred vision days before.  The wife approximately estimated the duration of the seizure at 1 minute or less and the last previous seizure was on 12-12-18.  After this phone conversation Vincent David increase the Keppra dose to 1500 twice a day.  He has not had seizures since but it is also only less than 4 weeks ago that this increase took place.  He reports that he has  been irritable and that he feels that the Keppra is contributing to a different personality.  The increase however has not increased the level of irritability he had already on 1000 mg twice daily before.  In the meantime in February he underwent an MRI of the brain with and without contrast which was a normal MRI interpreted by Dr. Andrey Spearman.  He did not mention hippocampal structures, he says that there was no evidence of ischemia but the ventricles were normal in size but there was no mass-effect or midline shift and no lesions seen.  There was also no evidence of sinusitis which sometimes plays a role.  DATE OF RECORDING: 02/07/2017  REFERRING M.D.: Rikki Spearing, M.D.  Study Performed:  Baseline Polysomnogram with EEG montage  HISTORY:  54 year old male Seizure patient reports his very  first seizure took place probably in the year 2012 prior to his  first sleep evaluation. He also had a seizure during sleep in  January 2013 and was hospitalized for EEG in the hospital at the  time at [facility], [place]- the EEG returned negative. There  was no special etiology found when he had yet another nocturnal  seizure in July 2013. He was not on any medications at the time  and had not started on antiepileptic medications. Last seizure on  02-26-2016 and referred here on Keppra. He reports a head injury  during [employer] in 1986. He was treated for complicated  migraines with visual aura.  In 03-29-2017 he under went a sleep study for OSA, with expanded EEG montage- no seizure activity documented.  developed Complex sleep apnea- CPAP was initiated at 5 cmH20 with heated humidity per AASM split night standards and pressure was advanced to 15 cm water,  the technician changed to BiPAP and ASV mode during the same night- BiPAP final pressure of 18/14cmH20 with or without ST did not improve on hypopneas, apneas and created a [employer] pattern ( AHI of 45). The modality was changed to ASV which helped little, reducing the AHI to 13/hr.     At a CPAP pressure of 12 cmH20, there was a reduction of the AHI to  0.0 with Spo2 nadir at 92% and 110 minutes of sleep time with improvement of obstructive sleep apnea.    Sleep habits are as follows: The patient's dinner time is between 6 PM. The patient goes to bed at 8-9 PM and continues to sleep for 3 hour intervals , wakes spontaneously , not for bathroom breaks,. The preferred sleep position is laterally, right side, with the support of 2 pillows. Dreams are reportedly rare.[name]  4-5  AM is the usual rise time. The patient wakes up spontaneously.  He reports not feeling refreshed or restored in AM, and reports many injuries he has suffered, attributed to nocturnal seizure activity - rigid, convulsive type - no initial scream- in nocturnal sleep.    Review of Systems: Out of a complete 14 system review, the patient complains of only the following symptoms, and all other reviewed systems are negative.:  Fatigue, sleepiness , achiness.  How likely are you to doze in the following situations: 0 = not likely, 1 = slight chance, 2 = moderate chance, 3 = high chance   Sitting and Reading? Watching Television? Sitting inactive in a public place (theater or meeting)? As a passenger in a car for an hour without a break? Lying down in the afternoon when circumstances  permit? Sitting and talking to someone? Sitting quietly after lunch without alcohol? In a car, while stopped for a few minutes in traffic?   Total = 10/ 24 points   FSS endorsed at 40/ 63 points.   Social History   Socioeconomic History  . Marital status: Married    Spouse name: Vincent David  . Number of children: 3  . Years of education: Not on file  . Highest education level: Not on file  Occupational History    Employer: OTHER  Tobacco Use  . Smoking status: Current Every Day Smoker    Packs/day: 0.50    Types: Cigarettes  . Smokeless tobacco: Never Used  Vaping Use  . Vaping Use: Never used  Substance and Sexual Activity  . Alcohol use: Yes    Alcohol/week: 4.0 standard drinks    Types: 4 Standard drinks or equivalent per week  . Drug use: Never  . Sexual activity: Yes    Partners: Female  Other Topics Concern  . Not on file  Social History Narrative  . Not on file   Social Determinants of Health   Financial Resource Strain:   . Difficulty of Paying Living Expenses:   Food Insecurity:   . Worried About Charity fundraiser in the Last Year:   . Arboriculturist in the Last Year:   Transportation Needs:   . Film/video editor (Medical):   [name] Lack of Transportation (Non-Medical):   Physical Activity:   . Days of Exercise per Week:   . Minutes of Exercise per Session:   Stress:   . Feeling of Stress :   Social Connections:   . Frequency of Communication with Friends and Family:   . Frequency of Social Gatherings with Friends and Family:   . Attends Religious Services:   . Active Member of Clubs or Organizations:   . Attends Archivist Meetings:   [name] Marital Status:     Family History  Problem Relation Age of Onset  . Sleep apnea Mother   . Diabetes Mother   . High blood pressure Father     Past Medical History:  Diagnosis Date  . Goals of care, counseling/discussion 08/28/2019  . Postictal  headache   . Seizure ([place])   . Sleep apnea   .  Tinnitus     Past Surgical History:  Procedure Laterality Date  . KNEE SURGERY Left   . SHOULDER SURGERY Left      Current Outpatient Medications on File Prior to Visit  Medication Sig Dispense Refill  . ibuprofen (ADVIL) 200 MG tablet Take 4 tablets (800 mg total) by mouth every 6 (six) hours as needed.    . lamoTRIgine (LAMICTAL) 25 MG tablet Week one 25 mg at night po for 7 days. Week two 25 mg bid po for 7 days. Week three and four 50 mg bid for 14 days and RV with provider. Keppra should have been discontinued by week 4. (Patient taking differently: Take 50 mg by mouth 2 (two) times daily. ) 240 tablet 3  . nicotine (NICODERM CQ - DOSED IN MG/24 HR) 7 mg/24hr patch Place 1 patch (7 mg total) onto the skin daily. 28 patch 0  . oxyCODONE-acetaminophen (PERCOCET) 10-325 MG tablet Take 1 tablet by mouth every 8 (eight) hours as needed for pain. 40 tablet 0   No current facility-administered medications on file prior to visit.     Result Notes  Ref Range & Units 6 d ago  (08/28/19) 9 d ago  (08/25/19) 9 d ago  (08/25/19) 2 mo ago  (06/29/19)  Sodium 135 - 145 mmol/L 136   140 R  138   Potassium 3.5 - 5.1 mmol/L 4.3   4.6 R  4.5   Chloride 98 - 111 mmol/L 99   103 R  103   CO2 22 - 32 mmol/L 28   25 R  24   Glucose, Bld 70 - 99 mg/dL 110High   97 R, CM  107High CM   Comment: Glucose reference range applies only to samples taken after fasting for at least 8 hours.  BUN 6 - 20 mg/dL 18   15 R  12   Creatinine 0.61 - 1.24 mg/dL 1.06   1.15 R, CM  1.14   Calcium 8.9 - 10.3 mg/dL 11.1High   9.8 R  9.4   Total Protein 6.5 - 8.1 g/dL 7.5  6.7 R  6.5 R  7.0   Albumin 3.5 - 5.0 g/dL 4.8    3.8   AST 15 - 41 U/L 13Low   13 R  17   ALT 0 - 44 U/L 17   18 R  23   Alkaline Phosphatase 38 - 126 U/L 142High    117   Total Bilirubin 0.3 - 1.2 mg/dL 0.4   0.3 R  0.6   GFR, Est Non Af Am >60 mL/min >60   71 R  >60   GFR, Est AFR Am >60 mL/min >60   83 R  >60   Anion gap 5 - 15 9            IgG (Immunoglobin G), Serum 603 - 1,613 mg/dL 553Low   IgA 90 - 386 mg/dL 131   IgM (Immunoglobulin M), Srm 20 - 172 mg/dL 31   Comment: (NOTE)  Performed At: [facility]    Incidental CT findings: Abdominal aortic atherosclerosis. Normal caliber of the left renal vein. Mild hepatic steatosis.  SKELETON: Extensive hypermetabolic osseous metastasis. Posterior right seventh rib osseous and soft tissue lesion measures 5.5 x 3.9 cm and a S.U.V. max of 6.6 on 86/4.  Expansile right iliac mass measures 6.8 x 5.7 cm and a S.U.V. max  of 8.5 on 177/4.  A left femoral neck lytic lesion with cortical involvement measures 1.5 cm and a S.U.V. max of 8.2 on 203/4.  Eccentric right L5 vertebral body lesion measures a S.U.V. max of 8.7 including on 163/4.  Incidental CT findings: Degenerative changes of the left hip.  IMPRESSION: 1. Constellation of findings, including hypermetabolic osseous lesions and an infiltrative dominant left renal mass which are most consistent with metastatic renal cell carcinoma. 2. No evidence of hypermetabolic soft tissue metastasis. 3. Aortic atherosclerosis (ICD10-I70.0) and emphysema (ICD10-J43.9). 4. Mild hepatic steatosis.   Electronically Signed   By: Abigail Miyamoto M.D.   On: 09/02/2019 16:34   No Known Allergies  Physical exam:  Today's Vitals   09/03/19 0819  BP: (!) 161/81  Pulse: 86  Weight: (!) 224 lb (101.6 kg)  Height: 5\' 7"  (1.702 m)   Body mass index is 35.08 kg/m.   Wt Readings from Last 3 Encounters:  09/03/19 (!) 224 lb (101.6 kg)  08/28/19 (!) 226 lb (102.5 kg)  08/18/19 225 lb (102.1 kg)     Ht Readings from Last 3 Encounters:  09/03/19 5\' 7"  (1.702 m)  08/28/19 5\' 7"  (1.702 m)  08/13/19 5\' 7"  (1.702 m)      General: The patient is awake, alert and appears not in acute distress. Head: Normocephalic, atraumatic. Neck is supple. Mallampati 3 plus ,  Tongue bite marks.  neck circumference:18.5  inches . Nasal airflow  patent.  Retrognathia is not  seen.   Cardiovascular:  Regular rate and cardiac rhythm by pulse,  without distended neck veins. Respiratory: Lungs are clear to auscultation.  Skin:  Without evidence of ankle edema, or rash. Trunk: The patient's posture is erect.   Neurologic exam : The patient is awake and alert, oriented to place and time.   Memory subjective described as intact.  Attention span & concentration ability appears normal.  Speech is fluent,  without  dysarthria, dysphonia or aphasia.  Mood and affect are appropriate.   Cranial nerves: no loss of smell or taste reported  Pupils are equal and briskly reactive to light. Funduscopic exam deferred. He reports blurred vision, but no visual field loss.  Extraocular movements in vertical and horizontal planes were intact and without nystagmus. No Diplopia. Visual fields by finger perimetry are intact. Hearing was intact to soft voice and finger rubbing.    Facial sensation intact to fine touch.  Facial motor strength is symmetric and tongue and uvula move midline.  Neck ROM : rotation, tilt and flexion extension were normal for age and shoulder shrug was symmetrical.    Motor exam:  Symmetric bulk, tone and ROM.  right hip pain- restricted movements.  Normal tone without cog- wheeling, symmetric grip strength .  Sensory:  Fine touch, pinprick and vibration were tested  and  normal.  Proprioception tested in the upper extremities was normal.  Coordination: Rapid alternating movements in the fingers/hands were of normal speed.  The Finger-to-nose maneuver was intact without evidence of ataxia, dysmetria or tremor.  Gait and station: Patient could rise unassisted from a seated position, walked without assistive device.  Toe and heel walk were deferred.  Deep tendon reflexes: in the upper and lower extremities are symmetric and intact.    Total seizure history is about 9 years -  He is an active tobacco  user, drinks alcohol , reportedly in moderation- still this is a seizure threshold lowering agent.  We discussed this.     After  spending a total time of 40 plus  minutes face to face and additional time for physical and neurologic examination, review of laboratory studies,  personal review of imaging studies, reports and results of other testing and review of referral information / records as far as provided in visit, I have established the following assessments:  1) nocturnal seizures - considered possible non-epileptic. EMU withut abnormal findings, see reports by to Dr Benita Gutter , MD EMU. 2) OSA on CPAP- main reason for referral   3) irritabiity on Keppra. Changed to lamictal 50 mg bid , now increasing to 100 mg at night and in AM.  4) Lamictal may also help with generalized and social anxiety, worsening during time of cancer diagnosis ( PET scan reviewed). .     My Plan is to proceed with: 1) Lamictal 100 mg bid. 2) keep on CPAP - I would appreciate a download with his next visit in 4-6 month with NP.   I would like to thank  Emeterio Reeve, [address] [place],  [place] [address] for allowing me to meet with and to take care of this pleasant patient.   I plan to follow up  personally or through our NP within 6 month. Needs [place],  CPAP download. lab tests and review of cancer work up. The patient has to keep a seizure diary- and we will need to document this in each visit, q 6 month.     Electronically signed by: Larey Seat, MD 09/03/2019 8:33 AM  [facility] [employer] [facility] Board certified by The [employer] of Sleep Medicine and Diplomate of the [employer] of Sleep Medicine. Board certified In Neurology through the [place], Fellow of the [employer] of Neurology. Medical Director of [employer] [facility].

## 2019-09-04 ENCOUNTER — Ambulatory Visit (HOSPITAL_COMMUNITY)
Admission: RE | Admit: 2019-09-04 | Discharge: 2019-09-04 | Disposition: A | Discharge status: Home / Self Care | Payer: BC Managed Care – PPO | Source: Ambulatory Visit | Attending: Hematology & Oncology | Admitting: Hematology & Oncology

## 2019-09-04 ENCOUNTER — Other Ambulatory Visit: Payer: Self-pay

## 2019-09-04 DIAGNOSIS — C7951 Secondary malignant neoplasm of bone: Secondary | ICD-10-CM | POA: Diagnosis not present

## 2019-09-04 DIAGNOSIS — Z791 Long term (current) use of non-steroidal anti-inflammatories (NSAID): Secondary | ICD-10-CM | POA: Diagnosis not present

## 2019-09-04 DIAGNOSIS — C649 Malignant neoplasm of unspecified kidney, except renal pelvis: Secondary | ICD-10-CM | POA: Diagnosis not present

## 2019-09-04 DIAGNOSIS — R1909 Other intra-abdominal and pelvic swelling, mass and lump: Secondary | ICD-10-CM | POA: Diagnosis not present

## 2019-09-04 DIAGNOSIS — M25551 Pain in right hip: Secondary | ICD-10-CM | POA: Diagnosis not present

## 2019-09-04 DIAGNOSIS — Z79899 Other long term (current) drug therapy: Secondary | ICD-10-CM | POA: Diagnosis not present

## 2019-09-04 DIAGNOSIS — F17210 Nicotine dependence, cigarettes, uncomplicated: Secondary | ICD-10-CM | POA: Diagnosis not present

## 2019-09-04 DIAGNOSIS — R2241 Localized swelling, mass and lump, right lower limb: Secondary | ICD-10-CM

## 2019-09-04 DIAGNOSIS — C801 Malignant (primary) neoplasm, unspecified: Secondary | ICD-10-CM | POA: Diagnosis not present

## 2019-09-04 LAB — CBC
HCT: 47.4 % (ref 39.0–52.0)
Hemoglobin: 15.4 g/dL (ref 13.0–17.0)
MCH: 30.2 pg (ref 26.0–34.0)
MCHC: 32.5 g/dL (ref 30.0–36.0)
MCV: 92.9 fL (ref 80.0–100.0)
Platelets: 275 10*3/uL (ref 150–400)
RBC: 5.1 MIL/uL (ref 4.22–5.81)
RDW: 13.2 % (ref 11.5–15.5)
WBC: 9.2 10*3/uL (ref 4.0–10.5)
nRBC: 0 % (ref 0.0–0.2)

## 2019-09-04 LAB — PROTIME-INR
INR: 1 (ref 0.8–1.2)
Prothrombin Time: 13.1 seconds (ref 11.4–15.2)

## 2019-09-04 MED ORDER — FENTANYL CITRATE (PF) 100 MCG/2ML IJ SOLN
INTRAMUSCULAR | Status: DC | PRN
  Administered 2019-09-04: 50 ug via INTRAVENOUS
  Administered 2019-09-04: 25 ug via INTRAVENOUS

## 2019-09-04 MED ORDER — MIDAZOLAM HCL 2 MG/2ML IJ SOLN
INTRAMUSCULAR | Status: AC
  Filled 2019-09-04: qty 2

## 2019-09-04 MED ORDER — LIDOCAINE HCL 1 % IJ SOLN
INTRAMUSCULAR | Status: AC
  Filled 2019-09-04: qty 20

## 2019-09-04 MED ORDER — MIDAZOLAM HCL 2 MG/2ML IJ SOLN
INTRAMUSCULAR | Status: DC | PRN
  Administered 2019-09-04: 1 mg via INTRAVENOUS
  Administered 2019-09-04: 0.5 mg via INTRAVENOUS

## 2019-09-04 MED ORDER — SODIUM CHLORIDE 0.9 % IV SOLN: INTRAVENOUS | Status: DC

## 2019-09-04 MED ORDER — FENTANYL CITRATE (PF) 100 MCG/2ML IJ SOLN
INTRAMUSCULAR | Status: AC
  Filled 2019-09-04: qty 2

## 2019-09-04 NOTE — Consult Note (Signed)
Chief Complaint: Right iliac mass. Request is for right hip biopsy  Referring Physician(s): Dr. Pearletha Alfred  Supervising Physician: Sandi Mariscal  Patient Status: [facility] - Out-pt  History of Present Illness: Vincent David is a 54 y.o. male history of seizures was found to have a right  iliac mass while being worked up for right hip pain. Team is concerned for RCC. PET scan from 7.28.21 reads Expansile right iliac mass measures 6.8 x 5.7 cm and a S.U.V. max of 8.5 on 177/4 patient has no known allergies. Team is requesting biopsy of the right hip mass for further determination of osseous metastases versus multiple myeloma.   Past Medical History:  Diagnosis Date  . Goals of care, counseling/discussion 08/28/2019  . Postictal headache   . Seizure ([place])   . Sleep apnea   . Tinnitus     Past Surgical History:  Procedure Laterality Date  . KNEE SURGERY Left   . SHOULDER SURGERY Left     Allergies: Patient has no known allergies.  Medications: Prior to Admission medications   Medication Sig Start Date End Date Taking? Authorizing Provider  ibuprofen (ADVIL) 200 MG tablet Take 4 tablets (800 mg total) by mouth every 6 (six) hours as needed. 05/22/19  Yes Silverio Decamp, MD  lamoTRIgine (LAMICTAL) 100 MG tablet Take 1 tablet (100 mg total) by mouth daily. 09/03/19  Yes Dohmeier, Asencion Partridge, MD  nicotine (NICODERM CQ - DOSED IN MG/24 HR) 7 mg/24hr patch Place 1 patch (7 mg total) onto the skin daily. 08/28/19  Yes Silverio Decamp, MD  oxyCODONE-acetaminophen (PERCOCET) 10-325 MG tablet Take 1 tablet by mouth every 8 (eight) hours as needed for pain. 08/24/19  Yes Silverio Decamp, MD     Family History  Problem Relation Age of Onset  . Sleep apnea Mother   . Diabetes Mother   . High blood pressure Father     Social History   Socioeconomic History  . Marital status: Married    Spouse name: Otila Kluver  . Number of children: 3  . Years of education: Not on file  .  Highest education level: Not on file  Occupational History    Employer: OTHER  Tobacco Use  . Smoking status: Current Every Day Smoker    Packs/day: 0.50    Types: Cigarettes  . Smokeless tobacco: Never Used  Vaping Use  . Vaping Use: Never used  Substance and Sexual Activity  . Alcohol use: Yes    Alcohol/week: 4.0 standard drinks    Types: 4 Standard drinks or equivalent per week  . Drug use: Never  . Sexual activity: Yes    Partners: Female  Other Topics Concern  . Not on file  Social History Narrative  . Not on file   Social Determinants of Health   Financial Resource Strain:   . Difficulty of Paying Living Expenses:   Food Insecurity:   . Worried About Charity fundraiser in the Last Year:   . Arboriculturist in the Last Year:   Transportation Needs:   . Film/video editor (Medical):   [name] Lack of Transportation (Non-Medical):   Physical Activity:   . Days of Exercise per Week:   . Minutes of Exercise per Session:   Stress:   . Feeling of Stress :   Social Connections:   . Frequency of Communication with Friends and Family:   . Frequency of Social Gatherings with Friends and Family:   . Attends Religious Services:   .  Active Member of Clubs or Organizations:   . Attends Archivist Meetings:   [name] Marital Status:     Review of Systems: A 12 point ROS discussed and pertinent positives are indicated in the HPI above.  All other systems are negative.  Review of Systems  Constitutional: Positive for fatigue. Negative for fever.  HENT: Negative for congestion.   Respiratory: Negative for cough and shortness of breath.   Cardiovascular: Negative for chest pain.  Gastrointestinal: Negative for abdominal pain.  Musculoskeletal: Positive for myalgias ( right hip with radiation of pain to the lateral aspect of right thigh down to foot.).  Neurological: Negative for headaches.  Psychiatric/Behavioral: Negative for behavioral problems and confusion.     Vital Signs: BP (!) 150/80   Pulse 85   Temp 98.6 F (37 C) (Oral)   Resp 17   Ht 5' 7" (1.702 m)   Wt (!) 225 lb (102.1 kg)   SpO2 100%   BMI 35.24 kg/m   Physical Exam Vitals and nursing note reviewed.  Constitutional:      Appearance: He is well-developed.  HENT:     Head: Normocephalic.  Cardiovascular:     Rate and Rhythm: Normal rate and regular rhythm.     Heart sounds: Normal heart sounds.  Pulmonary:     Effort: Pulmonary effort is normal.     Breath sounds: Normal breath sounds.  Musculoskeletal:        General: Normal range of motion.     Cervical back: Normal range of motion.  Skin:    General: Skin is dry.  Neurological:     Mental Status: He is alert and oriented to person, place, and time.     Imaging: DG Chest 2 View  Result Date: 08/25/2019 CLINICAL DATA:  Destructive bony masses in the pelvis, assessment for primary malignancy. EXAM: CHEST - 2 VIEW COMPARISON:  None. FINDINGS: There is an expansile lytic lesion of the right seventh rib posteromedially. This is likely associated adjacent pleural thickening posteriorly on the lateral projection. Questionable scalloping of the inferior margin of the right sixth rib. The lungs appear otherwise clear. Cardiac and mediastinal margins appear normal. No blunting of the costophrenic angles. IMPRESSION: Expansile lytic lesion of the right seventh rib posteromedially. Questionable scalloping of the inferior margin of the right sixth rib. Appearance suspicious for osseous metastatic lesion given the clinical context. Electronically Signed   By: Van Clines M.D.   On: 08/25/2019 14:29   MR HIP RIGHT WO CONTRAST  Result Date: 08/24/2019 CLINICAL DATA:  Persistent chronic right hip pain. EXAM: MR OF THE RIGHT HIP WITHOUT CONTRAST TECHNIQUE: Multiplanar, multisequence MR imaging was performed. No intravenous contrast was administered. COMPARISON:  Radiographs 04/06/2019 FINDINGS: Bones: There are a variety of  osseous lesions compatible with metastatic disease or myeloma. The largest of these is in the right iliac bone and upper acetabulum, with extensive cortical breakthrough and lytic destruction of the bone, and with the tumor mass measuring 8.0 by 6.4 by 7.1 cm (volume = 190 cm^3). There surrounding soft tissue edema tracking in the adjacent right iliacus and gluteal musculature. A lytic lesion is present in the right L5 vertebral body and right transverse process, measuring about 5.3 by 2.2 by 2.7 cm (volume = 16 cm^3), with only mild if any extraosseous extension. There are lytic metastatic lesions in the intertrochanteric region of the left proximal femur. One of these has accentuated T2 signal measuring 1.7 cm in diameter with surrounding marrow edema,  and a nearby lesion in the greater trochanter measures 2.3 by 1.6 cm on image 15/4. Below the greater trochanter and along the adjacent vastus lateralis, a soft tissue tumor nodule measures about 2.7 by 1.6 cm on image 18/4. Articular cartilage and labrum Articular cartilage:  Unremarkable Labrum:  Grossly unremarkable Joint or bursal effusion Joint effusion:  Absent Bursae: No substantial bursitis identified. Muscles and tendons Muscles and tendons: As noted above, the right iliac tumor is associated with adjacent edema in the iliacus and gluteus minimus muscles. Tumor along the upper margin of the left (contralateral) vastus lateralis. Other findings Miscellaneous:   No obvious mass in the prostate gland. IMPRESSION: 1. Several expansile destructive osseous lesions compatible with skeletal metastatic disease or myeloma. The largest of these is in the right iliac bone and upper acetabulum, with extensive cortical breakthrough and lytic destruction of the bone, and with surrounding soft tissue edema tracking in the adjacent right iliacus and gluteus minimus muscles. 2. Additional metastatic lesions in the right L5 vertebral body and transverse process, left greater  trochanter, and along the proximal left vastus lateralis muscle. These results will be called to the ordering clinician or representative by the Radiologist Assistant, and communication documented in the PACS or [employer]. Electronically Signed   By: Van Clines M.D.   On: 08/24/2019 13:03   NM PET Image Initial (PI) Skull Base To Thigh  Result Date: 09/02/2019 CLINICAL DATA:  Initial treatment strategy for osseous metastasis versus multiple myeloma. EXAM: NUCLEAR MEDICINE PET SKULL BASE TO THIGH TECHNIQUE: 11.7 mCi F-18 FDG was injected intravenously. Full-ring PET imaging was performed from the skull base to thigh after the radiotracer. CT data was obtained and used for attenuation correction and anatomic localization. Fasting blood glucose: 101 mg/dl COMPARISON:  MRI of the right hip of 08/22/2019. FINDINGS: Mediastinal blood pool activity: SUV max 2.6 Liver activity: SUV max NA NECK: No areas of abnormal hypermetabolism. Incidental CT findings: Bilateral carotid atherosclerosis. No cervical adenopathy. CHEST: No pulmonary parenchymal or thoracic nodal hypermetabolism. Incidental CT findings: Aortic atherosclerosis. Mild centrilobular emphysema. ABDOMEN/PELVIS: No abdominopelvic nodal hypermetabolism. A dominant infiltrative, heterogeneous upper and interpolar left renal mass is not well evaluated at PET secondary to physiologic tracer accumulation. Felt to measure on the order of 8.0 x 6.4 cm on 128/4. Causes mild upper pole left-sided caliectasis. Other bilateral renal lesions are low-density and likely cysts. Incidental CT findings: Abdominal aortic atherosclerosis. Normal caliber of the left renal vein. Mild hepatic steatosis. SKELETON: Extensive hypermetabolic osseous metastasis. Posterior right seventh rib osseous and soft tissue lesion measures 5.5 x 3.9 cm and a S.U.V. max of 6.6 on 86/4. Expansile right iliac mass measures 6.8 x 5.7 cm and a S.U.V. max of 8.5 on 177/4. A left femoral  neck lytic lesion with cortical involvement measures 1.5 cm and a S.U.V. max of 8.2 on 203/4. Eccentric right L5 vertebral body lesion measures a S.U.V. max of 8.7 including on 163/4. Incidental CT findings: Degenerative changes of the left hip. IMPRESSION: 1. Constellation of findings, including hypermetabolic osseous lesions and an infiltrative dominant left renal mass which are most consistent with metastatic renal cell carcinoma. 2. No evidence of hypermetabolic soft tissue metastasis. 3. Aortic atherosclerosis (ICD10-I70.0) and emphysema (ICD10-J43.9). 4. Mild hepatic steatosis. Electronically Signed   By: Abigail Miyamoto M.D.   On: 09/02/2019 16:34    Labs:  CBC: Recent Labs    02/04/19 1627 06/29/19 1001 08/25/19 1450 08/28/19 0824  WBC 10.9* 8.4 9.9 8.5  HGB  16.8 16.0 15.5 15.6  HCT 48.7 47.5 47.2 46.8  PLT 226 224 272 246    COAGS: Recent Labs    06/29/19 1001 08/25/19 1450  INR 1.0 1.0  APTT  --  31    BMP: Recent Labs    02/04/19 1627 06/29/19 1001 08/25/19 1450 08/28/19 0824  NA 138 138 140 136  K 4.6 4.5 4.6 4.3  CL 101 103 103 99  CO2 _0 GLUCOSE 88 107* 97 110*  BUN _1 CALCIUM 9.3 9.4 9.8 11.1*  CREATININE 1.15 1.14 1.15 1.06  GFRNONAA 71 >60 71 >60  GFRAA 83 >60 83 >60    LIVER FUNCTION TESTS: Recent Labs    02/04/19 1627 06/29/19 1001 08/25/19 1450 08/28/19 0824  BILITOT 0.5 0.6 0.3 0.4  AST _2 13*  ALT _3 ALKPHOS  --  117  --  142*  PROT 6.8 7.0 6.7  6.5 7.5  ALBUMIN  --  3.8  --  4.8     Assessment and Plan:  54 y.o, male outpatient. History of seizures was found to have a right  iliac mass while being worked up for right hip pain. Team is concerned for RCC. PET scan from 7.28.21 reads Expansile right iliac mass measures 6.8 x 5.7 cm and a S.U.V. max of 8.5 on 177/4 patient has no known allergies. Team is requesting biopsy of the right hip mass for further determination of osseous metastases versus  multiple myeloma.  All labs  (from 7.23.21) and medications are within acceptable parameters. Patient is afebrile.  Risks and benefits of right hip was discussed with the patient and/or patient's family including, but not limited to bleeding, infection, damage to adjacent structures or low yield requiring additional tests.  All of the questions were answered and there is agreement to proceed.  Consent signed and in chart.     Thank you for this interesting consult.  I greatly enjoyed meeting [employer] and look forward to participating in their care.  A copy of this report was sent to the requesting provider on this date.  Electronically Signed: Jacqualine Mau, NP 09/04/2019, 9:55 AM   I spent a total of  40 Minutes   in face to face in clinical consultation, greater than 50% of which was counseling/coordinating care for right hip biopsy.

## 2019-09-04 NOTE — Progress Notes (Deleted)
Pt HR has been elevated on arrival and post, pt is post procedure and HR 115, this was reported to Dr Arlean Hopping  PA/ Augustina Mood, no further orders.

## 2019-09-04 NOTE — Procedures (Signed)
Interventional Radiology Procedure Note  Procedure: CT guided right iliac soft tissue mass biopsy.   Complications: None Recommendations:  - Ok to shower tomorrow - Do not submerge for 7 days - Routine care - DC home 1 hr   Signed,  Dulcy Fanny. Earleen Newport, DO

## 2019-09-04 NOTE — Discharge Instructions (Addendum)
Needle Biopsy, Care After These instructions tell you how to care for yourself after your procedure. Your doctor may also give you more specific instructions. Call your doctor if you have any problems or questions. What can I expect after the procedure? After the procedure, it is common to have:  Soreness.  Bruising.  Mild pain. Follow these instructions at home:   Return to your normal activities as told by your doctor. Ask your doctor what activities are safe for you.  Take over-the-counter and prescription medicines only as told by your doctor.  Wash your hands with soap and water before you change your bandage (dressing). If you cannot use soap and water, use hand sanitizer.  Follow instructions from your doctor about: ? How to take care of your puncture site. ? When and how to change your bandage. ? When to remove your bandage.  Check your puncture site every day for signs of infection. Watch for: ? Redness, swelling, or pain. ? Fluid or blood. ? Pus or a bad smell. ? Warmth.  Do not take baths, swim, or use a hot tub until your doctor approves. Ask your doctor if you may take showers. You may only be allowed to take sponge baths.  Keep all follow-up visits as told by your doctor. This is important. Contact a doctor if you have:  A fever.  Redness, swelling, or pain at the puncture site, and it lasts longer than a few days.  Fluid, blood, or pus coming from the puncture site.  Warmth coming from the puncture site. Get help right away if:  You have a lot of bleeding from the puncture site. Summary  After the procedure, it is common to have soreness, bruising, or mild pain at the puncture site.  Check your puncture site every day for signs of infection, such as redness, swelling, or pain.  Get help right away if you have severe bleeding from your puncture site. This information is not intended to replace advice given to you by your health care provider. Make  sure you discuss any questions you have with your health care provider. Document Revised: 02/04/2017 Document Reviewed: 02/04/2017 Elsevier Patient Education  2020 Elsevier Inc. Moderate Conscious Sedation, Adult Sedation is the use of medicines to promote relaxation and relieve discomfort and anxiety. Moderate conscious sedation is a type of sedation. Under moderate conscious sedation, you are less alert than normal, but you are still able to respond to instructions, touch, or both. Moderate conscious sedation is used during short medical and dental procedures. It is milder than deep sedation, which is a type of sedation under which you cannot be easily woken up. It is also milder than general anesthesia, which is the use of medicines to make you unconscious. Moderate conscious sedation allows you to return to your regular activities sooner. Tell a health care provider about:  Any allergies you have.  All medicines you are taking, including vitamins, herbs, eye drops, creams, and over-the-counter medicines.  Use of steroids (by mouth or creams).  Any problems you or family members have had with sedatives and anesthetic medicines.  Any blood disorders you have.  Any surgeries you have had.  Any medical conditions you have, such as sleep apnea.  Whether you are pregnant or may be pregnant.  Any use of cigarettes, alcohol, marijuana, or street drugs. What are the risks? Generally, this is a safe procedure. However, problems may occur, including:  Getting too much medicine (oversedation).  Nausea.  Allergic reaction to   medicines.  Trouble breathing. If this happens, a breathing tube may be used to help with breathing. It will be removed when you are awake and breathing on your own.  Heart trouble.  Lung trouble. What happens before the procedure? Staying hydrated Follow instructions from your health care provider about hydration, which may include:  Up to 2 hours before the  procedure - you may continue to drink clear liquids, such as water, clear fruit juice, black coffee, and plain tea. Eating and drinking restrictions Follow instructions from your health care provider about eating and drinking, which may include:  8 hours before the procedure - stop eating heavy meals or foods such as meat, fried foods, or fatty foods.  6 hours before the procedure - stop eating light meals or foods, such as toast or cereal.  6 hours before the procedure - stop drinking milk or drinks that contain milk.  2 hours before the procedure - stop drinking clear liquids. Medicine Ask your health care provider about:  Changing or stopping your regular medicines. This is especially important if you are taking diabetes medicines or blood thinners.  Taking medicines such as aspirin and ibuprofen. These medicines can thin your blood. Do not take these medicines before your procedure if your health care provider instructs you not to.  Tests and exams  You will have a physical exam.  You may have blood tests done to show: ? How well your kidneys and liver are working. ? How well your blood can clot. General instructions  Plan to have someone take you home from the hospital or clinic.  If you will be going home right after the procedure, plan to have someone with you for 24 hours. What happens during the procedure?  An IV tube will be inserted into one of your veins.  Medicine to help you relax (sedative) will be given through the IV tube.  The medical or dental procedure will be performed. What happens after the procedure?  Your blood pressure, heart rate, breathing rate, and blood oxygen level will be monitored often until the medicines you were given have worn off.  Do not drive for 24 hours. This information is not intended to replace advice given to you by your health care provider. Make sure you discuss any questions you have with your health care provider. Document  Revised: 01/04/2017 Document Reviewed: 05/14/2015 Elsevier Patient Education  2020 Elsevier Inc.  

## 2019-09-07 ENCOUNTER — Ambulatory Visit (INDEPENDENT_AMBULATORY_CARE_PROVIDER_SITE_OTHER): Payer: BC Managed Care – PPO

## 2019-09-07 ENCOUNTER — Other Ambulatory Visit: Payer: Self-pay

## 2019-09-07 DIAGNOSIS — C642 Malignant neoplasm of left kidney, except renal pelvis: Secondary | ICD-10-CM

## 2019-09-07 DIAGNOSIS — J321 Chronic frontal sinusitis: Secondary | ICD-10-CM | POA: Diagnosis not present

## 2019-09-07 DIAGNOSIS — I6782 Cerebral ischemia: Secondary | ICD-10-CM | POA: Diagnosis not present

## 2019-09-07 DIAGNOSIS — J3489 Other specified disorders of nose and nasal sinuses: Secondary | ICD-10-CM | POA: Diagnosis not present

## 2019-09-07 DIAGNOSIS — R2241 Localized swelling, mass and lump, right lower limb: Secondary | ICD-10-CM

## 2019-09-07 DIAGNOSIS — J322 Chronic ethmoidal sinusitis: Secondary | ICD-10-CM | POA: Diagnosis not present

## 2019-09-07 LAB — SURGICAL PATHOLOGY

## 2019-09-07 MED ORDER — GADOBUTROL 1 MMOL/ML IV SOLN
10.0000 mL | Freq: Once | INTRAVENOUS | Status: AC | PRN
  Administered 2019-09-07: 10 mL via INTRAVENOUS

## 2019-09-10 ENCOUNTER — Telehealth: Payer: Self-pay | Admitting: *Deleted

## 2019-09-10 ENCOUNTER — Other Ambulatory Visit: Payer: Self-pay | Admitting: *Deleted

## 2019-09-10 ENCOUNTER — Other Ambulatory Visit: Payer: Self-pay

## 2019-09-10 ENCOUNTER — Inpatient Hospital Stay: Payer: BC Managed Care – PPO | Attending: Hematology & Oncology | Admitting: Hematology & Oncology

## 2019-09-10 ENCOUNTER — Encounter: Payer: Self-pay | Admitting: Hematology & Oncology

## 2019-09-10 VITALS — BP 162/71 | HR 87 | Temp 98.3°F | Resp 18 | Wt 222.0 lb

## 2019-09-10 DIAGNOSIS — Z79899 Other long term (current) drug therapy: Secondary | ICD-10-CM | POA: Insufficient documentation

## 2019-09-10 DIAGNOSIS — R197 Diarrhea, unspecified: Secondary | ICD-10-CM | POA: Insufficient documentation

## 2019-09-10 DIAGNOSIS — Z7982 Long term (current) use of aspirin: Secondary | ICD-10-CM | POA: Diagnosis not present

## 2019-09-10 DIAGNOSIS — C7951 Secondary malignant neoplasm of bone: Secondary | ICD-10-CM | POA: Insufficient documentation

## 2019-09-10 DIAGNOSIS — G893 Neoplasm related pain (acute) (chronic): Secondary | ICD-10-CM | POA: Insufficient documentation

## 2019-09-10 DIAGNOSIS — M7989 Other specified soft tissue disorders: Secondary | ICD-10-CM | POA: Diagnosis not present

## 2019-09-10 DIAGNOSIS — C642 Malignant neoplasm of left kidney, except renal pelvis: Secondary | ICD-10-CM | POA: Insufficient documentation

## 2019-09-10 MED ORDER — OXYCODONE HCL 10 MG PO TABS: 10.0000 mg | ORAL_TABLET | ORAL | 0 refills | Status: DC | PRN

## 2019-09-10 MED ORDER — OXYCODONE HCL ER 15 MG PO T12A: 15.0000 mg | EXTENDED_RELEASE_TABLET | Freq: Two times a day (BID) | ORAL | 0 refills | Status: DC

## 2019-09-10 MED ORDER — GABAPENTIN 300 MG PO CAPS
300.0000 mg | ORAL_CAPSULE | Freq: Three times a day (TID) | ORAL | 4 refills | Status: DC
Start: 2019-09-10 — End: 2019-11-30

## 2019-09-10 NOTE — Progress Notes (Signed)
Hematology and Oncology Follow Up Visit  Vincent David [id number] 1966/02/04 54 y.o. 09/10/2019   Principle Diagnosis:   Metastatic Renal Cell Carcinoma - bone mets  Current Therapy:    Nivoumab/Ipilimumab -- cycle #1 to start on 09/24/2019  Xgeva 120 mg sq q 3 months  XRT to right iliac mass/ L5 vertebral body/ Left femoral neck  LEFT hip repair     Interim History:  Vincent David for follow-up.  This is his second office visit.  21st saw him, we basically had to figure out what the diagnosis was.  He David undergo a biopsy of the right iliac mass.  This was done on 09/03/2019.  The pathology report [id number]) showed metastatic clear cell carcinoma of the kidney.  The PET scan confirms that the primary is in the left kidney.  PET scan showed a mass in the left kidney measuring 8 x 6.4 cm.  This was in the upper pole of the left kidney.  He had extensive osseous metastatic disease.  My real concern is that there is a lytic lesion in the left femoral neck is an impending pathologic fracture and we need to see about getting this repaired.  He had the right iliac mass that was quite active.  He had extensive disease in his ribs and spine.  He had the mass in the L5 vertebral body.  He is really bothered by pain.  We will David to get him on a fairly aggressive pain regimen.  I will put him on OxyContin at 15 mg p.o. twice daily.  We will also put on short acting oxycodone at 10 mg p.o. every 4 hours as needed.  I will also use Neurontin at 300 mg p.o. 3 times daily as I suspect he probably has some neuropathic pain from the L5 lesion.  We will clearly need radiation therapy.  I will try to get hold of Dr. Sondra Come or one of his colleagues at [facility] to see if we can get radiation started quickly.  He will also need orthopedic surgery to repair the left femoral neck.  Again I suspect this is an impending fracture.  Because of the pain that he has on the right side, he is  putting a lot of pressure on the left leg.  I will speak with Dr. Rhona Raider to try to get surgery set up for next week if possible.  Vincent David an MRI of the brain which was unremarkable for any brain metastasis.  We initially saw him, his calcium was 11.1.  His LDH was 144.  I had to say that he probably has intermediate risk renal cell carcinoma.  He is still in very good shape.  His overall performance status is ECOG 1.  I think he could definitely tolerate aggressive therapy.  I would favor combination immunotherapy with Yervoy/Opdivo.  I think this would be considered standard of care for his metastatic renal cell carcinoma.  Thankfully, he comes in with his wife.  She is incredibly diligent and was very thorough and taking down notes.  She asked quite a few very good questions which we answered.  He will need to David a Port-A-Cath to be placed.   Medications:  Current Outpatient Medications:  .  gabapentin (NEURONTIN) 300 MG capsule, Take 1 capsule (300 mg total) by mouth 3 (three) times daily., Disp: 90 capsule, Rfl: 4 .  ibuprofen (ADVIL) 200 MG tablet, Take 4 tablets (800 mg total) by mouth every 6 (six)  hours as needed., Disp: , Rfl:  .  lamoTRIgine (LAMICTAL) 100 MG tablet, Take 1 tablet (100 mg total) by mouth daily., Disp: 60 tablet, Rfl: 5 .  nicotine (NICODERM CQ - DOSED IN MG/24 HR) 7 mg/24hr patch, Place 1 patch (7 mg total) onto the skin daily., Disp: 28 patch, Rfl: 0 .  oxyCODONE (OXYCONTIN) 15 mg 12 hr tablet, Take 1 tablet (15 mg total) by mouth every 12 (twelve) hours., Disp: 60 tablet, Rfl: 0 .  oxyCODONE 10 MG TABS, Take 1 tablet (10 mg total) by mouth every 4 (four) hours as needed for severe pain., Disp: 90 tablet, Rfl: 0  Allergies: No Known Allergies  Past Medical History, Surgical history, Social history, and Family History were reviewed and updated.  Review of Systems: Review of Systems  Constitutional: Negative.   HENT:  Negative.   Eyes:  Negative.   Respiratory: Negative.   Cardiovascular: Negative.   Gastrointestinal: Negative.   Endocrine: Negative.   Musculoskeletal: Positive for David pain and flank pain.  Skin: Negative.   Neurological: Negative.   Hematological: Negative.   Psychiatric/Behavioral: Negative.     Physical Exam:  weight is 222 lb (100.7 kg). His oral temperature is 98.3 F (36.8 C). His blood pressure is 162/71 (abnormal) and his pulse is 87. His respiration is 18 and oxygen saturation is 98%.   Wt Readings from Last 3 Encounters:  09/10/19 222 lb (100.7 kg)  09/04/19 (!) 225 lb (102.1 kg)  09/03/19 (!) 224 lb (101.6 kg)    Physical Exam Vitals reviewed.  HENT:     Head: Normocephalic and atraumatic.  Eyes:     Pupils: Pupils are equal, round, and reactive to light.  Cardiovascular:     Rate and Rhythm: Normal rate and regular rhythm.     Heart sounds: Normal heart sounds.  Pulmonary:     Effort: Pulmonary effort is normal.     Breath sounds: Normal breath sounds.  Abdominal:     General: Bowel sounds are normal.     Palpations: Abdomen is soft.  Musculoskeletal:        General: No tenderness or deformity. Normal range of motion.     Cervical David: Normal range of motion.  Lymphadenopathy:     Cervical: No cervical adenopathy.  Skin:    General: Skin is warm and dry.     Findings: No erythema or rash.  Neurological:     Mental Status: He is alert and oriented to person, place, and time.  Psychiatric:        Behavior: Behavior normal.        Thought Content: Thought content normal.        Judgment: Judgment normal.      Lab Results  Component Value Date   WBC 9.2 09/04/2019   HGB 15.4 09/04/2019   HCT 47.4 09/04/2019   MCV 92.9 09/04/2019   PLT 275 09/04/2019     Chemistry      Component Value Date/Time   NA 136 08/28/2019 0824   K 4.3 08/28/2019 0824   CL 99 08/28/2019 0824   CO2 28 08/28/2019 0824   BUN 18 08/28/2019 0824   CREATININE 1.06 08/28/2019 0824    CREATININE 1.15 08/25/2019 1450      Component Value Date/Time   CALCIUM 11.1 (H) 08/28/2019 0824   ALKPHOS 142 (H) 08/28/2019 0824   AST 13 (L) 08/28/2019 0824   ALT 17 08/28/2019 0824   BILITOT 0.4 08/28/2019 4098  Impression and Plan: Vincent David is a very nice 54 year old white male.  He has metastatic renal cell carcinoma.  For some reason, his disease he clearly favors his bones.  His lungs and liver and lymph nodes all appear to be clean.  Again, I think we are going to need a multidisciplinary approach.  My main concern right now is the left femoral neck lesion.  Again I worry about this as an impending pathologic fracture.  Since he puts a lot of weight onto his left side right now, I think this bone is definitely at risk for fracturing.  I will try to get a hold of our orthopedic surgeon, Dr. Rhona Raider, to see if he cannot get Vincent David in quickly for a fixation.  We will David to also get Radiation Oncology involved.  I think radiation therapy would be very helpful for his L5 vertebral body lesion, right iliac mass, and the left femoral neck lesion after he has the fixation.  As far as systemic therapy is concerned, again I believe that immunotherapy would be the favored approach.  He has a good performance status and I think he could handle combination immunotherapy.  We will see about getting radiation therapy started next week.  Hopefully he will be able to David surgery.  I think he would clearly needs the surgery first.  Again I am not sure when orthopedic surgery can get to him.  Hopefully I will speak with the surgeon tomorrow.  He will need to David a Port-A-Cath placed.  I would not get this done yet until we know when systemic therapy will start.  Again I spoke over an hour with Vincent David and his wife.  They are both very delightful.  He is very lucky that he is with his wife.  She is very insightful.  She is obviously very motivated and dedicated to helping him.  I  will hopefully start immunotherapy in 2-3 weeks.  Hopefully the pain medication protocol will be able to help give him some relief so he will be able to function a little bit better. This is quite complicated.   Volanda Napoleon, MD 8/5/20215:15 PM

## 2019-09-10 NOTE — Progress Notes (Signed)
START ON PATHWAY REGIMEN - Renal Cell     A cycle is every 21 days:     Nivolumab      Ipilimumab    A cycle is every 28 days:     Nivolumab   **Always confirm dose/schedule in your pharmacy ordering system**  Patient Characteristics: Stage IV/Metastatic Disease, Clear Cell, First Line, Intermediate or Poor Risk Therapeutic Status: Stage IV/Metastatic Disease Histology: Clear Cell Line of Therapy: First Line Risk Status: Poor Risk Intent of Therapy: Non-Curative / Palliative Intent, Discussed with Patient 

## 2019-09-10 NOTE — Telephone Encounter (Signed)
Call received from pharmacist at [facility] on [address] to notify Dr. Marin Olp that they do not have Oxycontin 15 mg in stock, and to [facility] [address] in [place].  Dr. Marin Olp notified.

## 2019-09-11 ENCOUNTER — Encounter: Payer: Self-pay | Admitting: *Deleted

## 2019-09-11 ENCOUNTER — Telehealth: Payer: Self-pay | Admitting: Hematology & Oncology

## 2019-09-11 ENCOUNTER — Other Ambulatory Visit: Payer: Self-pay

## 2019-09-11 ENCOUNTER — Encounter (HOSPITAL_COMMUNITY): Payer: Self-pay | Admitting: Orthopedic Surgery

## 2019-09-11 ENCOUNTER — Other Ambulatory Visit: Payer: Self-pay | Admitting: Orthopedic Surgery

## 2019-09-11 DIAGNOSIS — M25552 Pain in left hip: Secondary | ICD-10-CM | POA: Diagnosis not present

## 2019-09-11 DIAGNOSIS — Z9189 Other specified personal risk factors, not elsewhere classified: Secondary | ICD-10-CM

## 2019-09-11 NOTE — Progress Notes (Signed)
COVID Vaccine Completed: Yes Date COVID Vaccine completed: 04/2019 COVID vaccine manufacturer: Pfizer      PCP - Dr. Raynaldo Opitz Cardiologist - N/A  Chest x-ray - 08/25/19 in epic EKG - 06/29/19 epic Stress Test - N/A ECHO - N/A Cardiac Cath - N/A  Sleep Study - Yes CPAP - Yes   Fasting Blood Sugar - N/A Checks Blood Sugar _N/A____ times a day  Blood Thinner Instructions: N/A Aspirin Instructions: N/A Last Dose: N/A  Anesthesia review: N/A  Patient denies shortness of breath, fever, cough and chest pain at PAT appointment   Patient verbalized understanding of instructions that were given to them at the PAT appointment. Patient was also instructed that they will need to review over the PAT instructions again at home before surgery.

## 2019-09-11 NOTE — Progress Notes (Signed)
This nurse faxed the [employer] paperwork to [phone].

## 2019-09-11 NOTE — Telephone Encounter (Signed)
No los 8/5 los

## 2019-09-11 NOTE — H&P (Signed)
Vincent David is an 54 y.o. male.   Chief Complaint: Impending pathologic fracture of the inner troch region of the left femur secondary to metastases from renal cell carcinoma.    HPI:  Vincent David is here today in consultation from Dr. Marin Olp for evaluation and treatment of impending pathologic fracture of the inner troch region of the left femur secondary to metastases from renal cell carcinoma.  Patient was diagnosed back in the spring of this year PET scan has shown activity both in the left hip as well as the right hemipelvis where he is a much larger tumor above the acetabulum involving over 50% of the ilium.  Interestingly the only pain he really has is on the right side near the ilium the left hip does not hurt but on MRI scan and CT scan he has medial and lateral metastases in the intertrochanteric region that have penetrated through the cortex of the bone.  Past Medical History:  Diagnosis Date  . Goals of care, counseling/discussion 08/28/2019  . Postictal headache   . Seizure ([place])   . Sleep apnea   . Tinnitus     Past Surgical History:  Procedure Laterality Date  . KNEE SURGERY Left   . SHOULDER SURGERY Left     Family History  Problem Relation Age of Onset  . Sleep apnea Mother   . Diabetes Mother   . High blood pressure Father    Social History:  reports that he has been smoking cigarettes. He has been smoking about 0.50 packs per day. He has never used smokeless tobacco. He reports current alcohol use of about 4.0 standard drinks of alcohol per week. He reports that he does not use drugs.  Allergies: No Known Allergies  No medications prior to admission.    No results found for this or any previous visit (from the past 48 hour(s)). No results found.  Review of Systems  Constitutional: Negative.   HENT: Negative.   Eyes: Negative.   Respiratory: Negative.   Cardiovascular: Negative.   Gastrointestinal: Negative.   Endocrine: Negative.   Genitourinary:  Negative.   Musculoskeletal: Positive for arthralgias.  Allergic/Immunologic: Negative.   Neurological: Positive for seizures.  Hematological: Negative.   Psychiatric/Behavioral: Negative.     There were no vitals taken for this visit. Physical Exam HENT:     Head: Normocephalic and atraumatic.  Eyes:     Pupils: Pupils are equal, round, and reactive to light.  Cardiovascular:     Pulses: Normal pulses.  Pulmonary:     Effort: Pulmonary effort is normal.  Musculoskeletal:        General: Normal range of motion.     Cervical back: Normal range of motion and neck supple.     Comments: Tender to palpation over the lateral aspect of the right hip the left hip has no pain with internal or external rotation foot tap is negative.  The skin is intact over the left hip neurovascular intact distally toes are pink and well perfused.  I did review his plain x-rays CT scan MRI scan and PET scan that a been none recently and he does have medial and lateral metastases in the interfocal region of the left hip that have penetrated through the cortex of the bone.  Skin:    General: Skin is warm and dry.  Neurological:     General: No focal deficit present.     Mental Status: He is alert and oriented to person, place, and time. Mental status  is at baseline.  Psychiatric:        Mood and Affect: Mood normal.        Behavior: Behavior normal.        Thought Content: Thought content normal.        Judgment: Judgment normal.      Assessment/Plan Assess: impending pathological fracture in  region of left hip and a 54 year old man also has a significant large metastasis in the right ilium that will be treated with radiation therapy as soon as he gets prophylactic nailing of his left hip.  Plan: We will get him set up at [facility] for placement left hip reconstructive nail [employer] met [employer] affixis system.  Risks and benefits of surgery discussed at length.  Posting slip is been filled out and  given to our scheduler Marlin Canary.  Patient was issued a set of crutches and instructed to limit weight on his right lower extremity to 20 or 30 pounds as not to stress his pelvis which has the large tumor in the ilium.  Joanell Rising, PA-C 09/11/2019, 3:53 PM

## 2019-09-12 ENCOUNTER — Other Ambulatory Visit (HOSPITAL_COMMUNITY)
Admission: RE | Admit: 2019-09-12 | Discharge: 2019-09-12 | Disposition: A | Discharge status: Home / Self Care | Payer: BC Managed Care – PPO | Source: Ambulatory Visit | Attending: Orthopedic Surgery | Admitting: Orthopedic Surgery

## 2019-09-12 DIAGNOSIS — Z01812 Encounter for preprocedural laboratory examination: Secondary | ICD-10-CM | POA: Insufficient documentation

## 2019-09-12 DIAGNOSIS — Z20822 Contact with and (suspected) exposure to covid-19: Secondary | ICD-10-CM | POA: Insufficient documentation

## 2019-09-12 LAB — SARS CORONAVIRUS 2 (TAT 6-24 HRS): SARS Coronavirus 2: NEGATIVE

## 2019-09-14 ENCOUNTER — Other Ambulatory Visit: Payer: Self-pay | Admitting: Neurology

## 2019-09-14 ENCOUNTER — Ambulatory Visit (HOSPITAL_COMMUNITY): Payer: BC Managed Care – PPO | Admitting: Certified Registered"

## 2019-09-14 ENCOUNTER — Ambulatory Visit (HOSPITAL_COMMUNITY): Payer: BC Managed Care – PPO

## 2019-09-14 ENCOUNTER — Encounter: Payer: Self-pay | Admitting: Neurology

## 2019-09-14 ENCOUNTER — Encounter: Payer: Self-pay | Admitting: *Deleted

## 2019-09-14 ENCOUNTER — Encounter (HOSPITAL_COMMUNITY)
Admission: AD | Disposition: A | Discharge status: Home / Self Care | Payer: Self-pay | Source: Home / Self Care | Attending: Orthopedic Surgery

## 2019-09-14 ENCOUNTER — Observation Stay (HOSPITAL_COMMUNITY)
Admission: AD | Admit: 2019-09-14 | Discharge: 2019-09-15 | Disposition: A | Discharge status: Home / Self Care | Payer: BC Managed Care – PPO | Attending: Orthopedic Surgery | Admitting: Orthopedic Surgery

## 2019-09-14 DIAGNOSIS — M84452A Pathological fracture, left femur, initial encounter for fracture: Secondary | ICD-10-CM | POA: Diagnosis not present

## 2019-09-14 DIAGNOSIS — C649 Malignant neoplasm of unspecified kidney, except renal pelvis: Secondary | ICD-10-CM

## 2019-09-14 DIAGNOSIS — Z419 Encounter for procedure for purposes other than remedying health state, unspecified: Secondary | ICD-10-CM

## 2019-09-14 DIAGNOSIS — F17210 Nicotine dependence, cigarettes, uncomplicated: Secondary | ICD-10-CM | POA: Insufficient documentation

## 2019-09-14 DIAGNOSIS — M84552A Pathological fracture in neoplastic disease, left femur, initial encounter for fracture: Principal | ICD-10-CM | POA: Insufficient documentation

## 2019-09-14 DIAGNOSIS — G4730 Sleep apnea, unspecified: Secondary | ICD-10-CM | POA: Insufficient documentation

## 2019-09-14 DIAGNOSIS — Z9189 Other specified personal risk factors, not elsewhere classified: Secondary | ICD-10-CM

## 2019-09-14 DIAGNOSIS — C651 Malignant neoplasm of right renal pelvis: Secondary | ICD-10-CM | POA: Diagnosis not present

## 2019-09-14 DIAGNOSIS — G4733 Obstructive sleep apnea (adult) (pediatric): Secondary | ICD-10-CM | POA: Diagnosis not present

## 2019-09-14 DIAGNOSIS — Z7982 Long term (current) use of aspirin: Secondary | ICD-10-CM | POA: Diagnosis not present

## 2019-09-14 DIAGNOSIS — Z79899 Other long term (current) drug therapy: Secondary | ICD-10-CM | POA: Diagnosis not present

## 2019-09-14 DIAGNOSIS — S72142A Displaced intertrochanteric fracture of left femur, initial encounter for closed fracture: Secondary | ICD-10-CM | POA: Diagnosis not present

## 2019-09-14 DIAGNOSIS — C7951 Secondary malignant neoplasm of bone: Secondary | ICD-10-CM | POA: Insufficient documentation

## 2019-09-14 DIAGNOSIS — C642 Malignant neoplasm of left kidney, except renal pelvis: Secondary | ICD-10-CM

## 2019-09-14 HISTORY — DX: Malignant neoplasm of unspecified kidney, except renal pelvis: C64.9

## 2019-09-14 HISTORY — PX: ORIF PELVIC FRACTURE: SHX2128

## 2019-09-14 LAB — CBC
HCT: 45 % (ref 39.0–52.0)
Hemoglobin: 14.9 g/dL (ref 13.0–17.0)
MCH: 30.7 pg (ref 26.0–34.0)
MCHC: 33.1 g/dL (ref 30.0–36.0)
MCV: 92.8 fL (ref 80.0–100.0)
Platelets: 207 10*3/uL (ref 150–400)
RBC: 4.85 MIL/uL (ref 4.22–5.81)
RDW: 13.2 % (ref 11.5–15.5)
WBC: 10 10*3/uL (ref 4.0–10.5)
nRBC: 0 % (ref 0.0–0.2)

## 2019-09-14 LAB — BASIC METABOLIC PANEL
Anion gap: 11 (ref 5–15)
BUN: 17 mg/dL (ref 6–20)
CO2: 24 mmol/L (ref 22–32)
Calcium: 10.4 mg/dL — ABNORMAL HIGH (ref 8.9–10.3)
Chloride: 101 mmol/L (ref 98–111)
Creatinine, Ser: 1 mg/dL (ref 0.61–1.24)
GFR calc Af Amer: >60 mL/min (ref >60)
GFR calc non Af Amer: >60 mL/min (ref >60)
Glucose, Bld: 109 mg/dL — ABNORMAL HIGH (ref 70–99)
Potassium: 4.1 mmol/L (ref 3.5–5.1)
Sodium: 136 mmol/L (ref 135–145)

## 2019-09-14 LAB — SAMPLE TO BLOOD BANK

## 2019-09-14 SURGERY — OPEN REDUCTION INTERNAL FIXATION (ORIF) PELVIC FRACTURE
Anesthesia: General | Site: Leg Upper | Laterality: Left

## 2019-09-14 MED ORDER — PROPOFOL 10 MG/ML IV BOLUS
INTRAVENOUS | Status: DC | PRN
  Administered 2019-09-14: 200 mg via INTRAVENOUS

## 2019-09-14 MED ORDER — KETAMINE HCL 10 MG/ML IJ SOLN
INTRAMUSCULAR | Status: AC
  Filled 2019-09-14: qty 1

## 2019-09-14 MED ORDER — LACTATED RINGERS IV SOLN: INTRAVENOUS | Status: DC

## 2019-09-14 MED ORDER — DOCUSATE SODIUM 100 MG PO CAPS
100.0000 mg | ORAL_CAPSULE | Freq: Two times a day (BID) | ORAL | Status: DC
  Administered 2019-09-14 – 2019-09-15 (×2): 100 mg via ORAL
  Filled 2019-09-14 (×2): qty 1

## 2019-09-14 MED ORDER — CEFAZOLIN SODIUM-DEXTROSE 2-4 GM/100ML-% IV SOLN
INTRAVENOUS | Status: AC
  Filled 2019-09-14: qty 100

## 2019-09-14 MED ORDER — ROCURONIUM BROMIDE 10 MG/ML (PF) SYRINGE
PREFILLED_SYRINGE | INTRAVENOUS | Status: AC
  Filled 2019-09-14: qty 10

## 2019-09-14 MED ORDER — ONDANSETRON HCL 4 MG/2ML IJ SOLN: 4.0000 mg | Freq: Four times a day (QID) | INTRAMUSCULAR | Status: DC | PRN

## 2019-09-14 MED ORDER — SENNOSIDES-DOCUSATE SODIUM 8.6-50 MG PO TABS: 1.0000 | ORAL_TABLET | Freq: Every evening | ORAL | Status: DC | PRN

## 2019-09-14 MED ORDER — PANTOPRAZOLE SODIUM 40 MG PO TBEC
40.0000 mg | DELAYED_RELEASE_TABLET | Freq: Every day | ORAL | Status: DC
  Administered 2019-09-15: 40 mg via ORAL
  Filled 2019-09-14: qty 1

## 2019-09-14 MED ORDER — ORAL CARE MOUTH RINSE: 15.0000 mL | Freq: Once | OROMUCOSAL | Status: AC

## 2019-09-14 MED ORDER — MIDAZOLAM HCL 2 MG/2ML IJ SOLN
INTRAMUSCULAR | Status: DC | PRN
  Administered 2019-09-14: 2 mg via INTRAVENOUS

## 2019-09-14 MED ORDER — SUGAMMADEX SODIUM 200 MG/2ML IV SOLN
INTRAVENOUS | Status: DC | PRN
  Administered 2019-09-14: 300 mg via INTRAVENOUS

## 2019-09-14 MED ORDER — 0.9 % SODIUM CHLORIDE (POUR BTL) OPTIME
TOPICAL | Status: DC | PRN
  Administered 2019-09-14: 1000 mL

## 2019-09-14 MED ORDER — ONDANSETRON HCL 4 MG PO TABS: 4.0000 mg | ORAL_TABLET | Freq: Four times a day (QID) | ORAL | Status: DC | PRN

## 2019-09-14 MED ORDER — ONDANSETRON HCL 4 MG/2ML IJ SOLN
INTRAMUSCULAR | Status: DC | PRN
  Administered 2019-09-14: 4 mg via INTRAVENOUS

## 2019-09-14 MED ORDER — ACETAMINOPHEN 325 MG PO TABS: 325.0000 mg | ORAL_TABLET | Freq: Four times a day (QID) | ORAL | Status: DC | PRN

## 2019-09-14 MED ORDER — CEFAZOLIN SODIUM-DEXTROSE 2-4 GM/100ML-% IV SOLN
2.0000 g | INTRAVENOUS | Status: AC
  Administered 2019-09-14: 2 g via INTRAVENOUS

## 2019-09-14 MED ORDER — ASPIRIN EC 81 MG PO TBEC: 81.0000 mg | DELAYED_RELEASE_TABLET | Freq: Two times a day (BID) | ORAL | 0 refills | Status: AC

## 2019-09-14 MED ORDER — METHOCARBAMOL 500 MG PO TABS
500.0000 mg | ORAL_TABLET | Freq: Four times a day (QID) | ORAL | Status: DC | PRN
  Administered 2019-09-15 (×3): 500 mg via ORAL
  Filled 2019-09-14 (×3): qty 1

## 2019-09-14 MED ORDER — STERILE WATER FOR IRRIGATION IR SOLN
Status: DC | PRN
  Administered 2019-09-14: 2000 mL

## 2019-09-14 MED ORDER — FENTANYL CITRATE (PF) 250 MCG/5ML IJ SOLN
INTRAMUSCULAR | Status: DC | PRN
  Administered 2019-09-14 (×2): 50 ug via INTRAVENOUS
  Administered 2019-09-14: 100 ug via INTRAVENOUS
  Administered 2019-09-14: 50 ug via INTRAVENOUS

## 2019-09-14 MED ORDER — HYDROMORPHONE HCL 1 MG/ML IJ SOLN
0.5000 mg | INTRAMUSCULAR | Status: DC | PRN
  Administered 2019-09-14: 1 mg via INTRAVENOUS
  Filled 2019-09-14: qty 1

## 2019-09-14 MED ORDER — ROCURONIUM BROMIDE 10 MG/ML (PF) SYRINGE
PREFILLED_SYRINGE | INTRAVENOUS | Status: DC | PRN
  Administered 2019-09-14: 50 mg via INTRAVENOUS
  Administered 2019-09-14: 30 mg via INTRAVENOUS

## 2019-09-14 MED ORDER — MENTHOL 3 MG MT LOZG
1.0000 | LOZENGE | OROMUCOSAL | Status: DC | PRN
  Filled 2019-09-14: qty 9

## 2019-09-14 MED ORDER — ALUM & MAG HYDROXIDE-SIMETH 200-200-20 MG/5ML PO SUSP: 30.0000 mL | ORAL | Status: DC | PRN

## 2019-09-14 MED ORDER — ASPIRIN EC 81 MG PO TBEC
81.0000 mg | DELAYED_RELEASE_TABLET | Freq: Two times a day (BID) | ORAL | Status: DC
  Administered 2019-09-14 – 2019-09-15 (×2): 81 mg via ORAL
  Filled 2019-09-14 (×2): qty 1

## 2019-09-14 MED ORDER — OXYCODONE HCL 5 MG PO TABS
5.0000 mg | ORAL_TABLET | ORAL | Status: DC | PRN
  Administered 2019-09-14 – 2019-09-15 (×4): 10 mg via ORAL
  Filled 2019-09-14 (×4): qty 2

## 2019-09-14 MED ORDER — ONDANSETRON HCL 4 MG/2ML IJ SOLN
INTRAMUSCULAR | Status: AC
  Filled 2019-09-14: qty 2

## 2019-09-14 MED ORDER — METHOCARBAMOL 500 MG IVPB - SIMPLE MED
INTRAVENOUS | Status: AC
  Filled 2019-09-14: qty 50

## 2019-09-14 MED ORDER — LIDOCAINE HCL (CARDIAC) PF 100 MG/5ML IV SOSY
PREFILLED_SYRINGE | INTRAVENOUS | Status: DC | PRN
  Administered 2019-09-14: 80 mg via INTRAVENOUS

## 2019-09-14 MED ORDER — TRANEXAMIC ACID-NACL 1000-0.7 MG/100ML-% IV SOLN
INTRAVENOUS | Status: AC
  Filled 2019-09-14: qty 100

## 2019-09-14 MED ORDER — METHOCARBAMOL 500 MG IVPB - SIMPLE MED
500.0000 mg | Freq: Four times a day (QID) | INTRAVENOUS | Status: DC | PRN
  Administered 2019-09-14: 500 mg via INTRAVENOUS
  Filled 2019-09-14: qty 50

## 2019-09-14 MED ORDER — LAMOTRIGINE 100 MG PO TABS
100.0000 mg | ORAL_TABLET | Freq: Two times a day (BID) | ORAL | Status: DC
  Administered 2019-09-14 – 2019-09-15 (×2): 100 mg via ORAL
  Filled 2019-09-14 (×2): qty 1

## 2019-09-14 MED ORDER — OXYCODONE HCL 5 MG PO TABS: 10.0000 mg | ORAL_TABLET | ORAL | Status: DC | PRN

## 2019-09-14 MED ORDER — CHLORHEXIDINE GLUCONATE 0.12 % MT SOLN
15.0000 mL | Freq: Once | OROMUCOSAL | Status: AC
  Administered 2019-09-14: 15 mL via OROMUCOSAL

## 2019-09-14 MED ORDER — OXYCODONE HCL 5 MG/5ML PO SOLN: 5.0000 mg | Freq: Once | ORAL | Status: DC | PRN

## 2019-09-14 MED ORDER — PHENOL 1.4 % MT LIQD: 1.0000 | OROMUCOSAL | Status: DC | PRN

## 2019-09-14 MED ORDER — DEXMEDETOMIDINE (PRECEDEX) IN NS 20 MCG/5ML (4 MCG/ML) IV SYRINGE
PREFILLED_SYRINGE | INTRAVENOUS | Status: DC | PRN
  Administered 2019-09-14: 12 ug via INTRAVENOUS
  Administered 2019-09-14: 8 ug via INTRAVENOUS

## 2019-09-14 MED ORDER — DEXAMETHASONE SODIUM PHOSPHATE 10 MG/ML IJ SOLN
INTRAMUSCULAR | Status: DC | PRN
  Administered 2019-09-14: 10 mg via INTRAVENOUS

## 2019-09-14 MED ORDER — NICOTINE 7 MG/24HR TD PT24: 7.0000 mg | MEDICATED_PATCH | Freq: Every day | TRANSDERMAL | Status: DC

## 2019-09-14 MED ORDER — DEXMEDETOMIDINE (PRECEDEX) IN NS 20 MCG/5ML (4 MCG/ML) IV SYRINGE
PREFILLED_SYRINGE | INTRAVENOUS | Status: AC
  Filled 2019-09-14: qty 5

## 2019-09-14 MED ORDER — LAMOTRIGINE 100 MG PO TABS: 100.0000 mg | ORAL_TABLET | Freq: Two times a day (BID) | ORAL | 1 refills | Status: DC

## 2019-09-14 MED ORDER — BISACODYL 5 MG PO TBEC: 5.0000 mg | DELAYED_RELEASE_TABLET | Freq: Every day | ORAL | Status: DC | PRN

## 2019-09-14 MED ORDER — METOCLOPRAMIDE HCL 5 MG PO TABS: 5.0000 mg | ORAL_TABLET | Freq: Three times a day (TID) | ORAL | Status: DC | PRN

## 2019-09-14 MED ORDER — FENTANYL CITRATE (PF) 250 MCG/5ML IJ SOLN
INTRAMUSCULAR | Status: AC
  Filled 2019-09-14: qty 5

## 2019-09-14 MED ORDER — MIDAZOLAM HCL 2 MG/2ML IJ SOLN
INTRAMUSCULAR | Status: AC
  Filled 2019-09-14: qty 2

## 2019-09-14 MED ORDER — TRANEXAMIC ACID-NACL 1000-0.7 MG/100ML-% IV SOLN
INTRAVENOUS | Status: DC | PRN
Start: 2019-09-14 — End: 2019-09-14
  Administered 2019-09-14: 1000 mg via INTRAVENOUS

## 2019-09-14 MED ORDER — MEPERIDINE HCL 50 MG/ML IJ SOLN: 6.2500 mg | INTRAMUSCULAR | Status: DC | PRN

## 2019-09-14 MED ORDER — TIZANIDINE HCL 2 MG PO TABS
2.0000 mg | ORAL_TABLET | Freq: Four times a day (QID) | ORAL | 0 refills | Status: DC | PRN
Start: 2019-09-14 — End: 2019-11-30

## 2019-09-14 MED ORDER — METOCLOPRAMIDE HCL 5 MG/ML IJ SOLN: 5.0000 mg | Freq: Three times a day (TID) | INTRAMUSCULAR | Status: DC | PRN

## 2019-09-14 MED ORDER — KETAMINE HCL 10 MG/ML IJ SOLN
INTRAMUSCULAR | Status: DC | PRN
  Administered 2019-09-14: 20 mg via INTRAVENOUS

## 2019-09-14 MED ORDER — KCL IN DEXTROSE-NACL 20-5-0.45 MEQ/L-%-% IV SOLN
INTRAVENOUS | Status: DC
  Filled 2019-09-14 (×2): qty 1000

## 2019-09-14 MED ORDER — LIDOCAINE 2% (20 MG/ML) 5 ML SYRINGE
INTRAMUSCULAR | Status: AC
  Filled 2019-09-14: qty 5

## 2019-09-14 MED ORDER — HYDROMORPHONE HCL 1 MG/ML IJ SOLN
0.2500 mg | INTRAMUSCULAR | Status: DC | PRN
  Administered 2019-09-14: 0.5 mg via INTRAVENOUS

## 2019-09-14 MED ORDER — GABAPENTIN 300 MG PO CAPS
300.0000 mg | ORAL_CAPSULE | Freq: Three times a day (TID) | ORAL | Status: DC
  Administered 2019-09-14 – 2019-09-15 (×2): 300 mg via ORAL
  Filled 2019-09-14 (×2): qty 1

## 2019-09-14 MED ORDER — DEXAMETHASONE SODIUM PHOSPHATE 10 MG/ML IJ SOLN
INTRAMUSCULAR | Status: AC
  Filled 2019-09-14: qty 1

## 2019-09-14 MED ORDER — PHENYLEPHRINE HCL-NACL 10-0.9 MG/250ML-% IV SOLN
INTRAVENOUS | Status: DC | PRN
Start: 2019-09-14 — End: 2019-09-14
  Administered 2019-09-14: 50 ug/min via INTRAVENOUS

## 2019-09-14 MED ORDER — PROMETHAZINE HCL 25 MG/ML IJ SOLN: 6.2500 mg | INTRAMUSCULAR | Status: DC | PRN

## 2019-09-14 MED ORDER — HYDROMORPHONE HCL 1 MG/ML IJ SOLN
INTRAMUSCULAR | Status: AC
  Administered 2019-09-14: 0.5 mg via INTRAVENOUS
  Filled 2019-09-14: qty 1

## 2019-09-14 MED ORDER — PHENYLEPHRINE 40 MCG/ML (10ML) SYRINGE FOR IV PUSH (FOR BLOOD PRESSURE SUPPORT)
PREFILLED_SYRINGE | INTRAVENOUS | Status: DC | PRN
  Administered 2019-09-14 (×4): 80 ug via INTRAVENOUS

## 2019-09-14 MED ORDER — OXYCODONE HCL 5 MG PO TABS: 5.0000 mg | ORAL_TABLET | Freq: Once | ORAL | Status: DC | PRN

## 2019-09-14 MED ORDER — PROPOFOL 10 MG/ML IV BOLUS
INTRAVENOUS | Status: AC
  Filled 2019-09-14: qty 20

## 2019-09-14 SURGICAL SUPPLY — 54 items
BAG ZIPLOCK 12X15 (MISCELLANEOUS) ×3 IMPLANT
BIT DRILL 4.3MMS DISTAL GRDTED (BIT) ×1 IMPLANT
BNDG COHESIVE 6X5 TAN STRL LF (GAUZE/BANDAGES/DRESSINGS) ×3 IMPLANT
CHLORAPREP W/TINT 26 (MISCELLANEOUS) ×3 IMPLANT
COVER PERINEAL POST (MISCELLANEOUS) ×3 IMPLANT
COVER SURGICAL LIGHT HANDLE (MISCELLANEOUS) ×3 IMPLANT
COVER WAND RF STERILE (DRAPES) ×3 IMPLANT
DRAPE C-ARM 42X120 X-RAY (DRAPES) ×3 IMPLANT
DRAPE INCISE IOBAN 66X45 STRL (DRAPES) ×3 IMPLANT
DRAPE ORTHO SPLIT 77X108 STRL (DRAPES) ×4
DRAPE SHEET LG 3/4 BI-LAMINATE (DRAPES) ×3 IMPLANT
DRAPE STERI IOBAN 125X83 (DRAPES) ×3 IMPLANT
DRAPE SURG ORHT 6 SPLT 77X108 (DRAPES) ×2 IMPLANT
DRAPE U-SHAPE 47X51 STRL (DRAPES) ×3 IMPLANT
DRILL 4.3MMS DISTAL GRADUATED (BIT) ×3
DRSG MEPILEX BORDER 4X4 (GAUZE/BANDAGES/DRESSINGS) ×6 IMPLANT
DRSG PAD ABDOMINAL 8X10 ST (GAUZE/BANDAGES/DRESSINGS) ×3 IMPLANT
ELECT REM PT RETURN 15FT ADLT (MISCELLANEOUS) ×3 IMPLANT
FACESHIELD WRAPAROUND (MASK) ×9 IMPLANT
GAUZE SPONGE 4X4 12PLY STRL (GAUZE/BANDAGES/DRESSINGS) ×3 IMPLANT
GLOVE BIO SURGEON STRL SZ7 (GLOVE) ×3 IMPLANT
GLOVE BIO SURGEON STRL SZ7.5 (GLOVE) ×3 IMPLANT
GLOVE BIO SURGEON STRL SZ8 (GLOVE) ×3 IMPLANT
GLOVE BIOGEL PI IND STRL 7.0 (GLOVE) ×1 IMPLANT
GLOVE BIOGEL PI IND STRL 8 (GLOVE) ×1 IMPLANT
GLOVE BIOGEL PI INDICATOR 7.0 (GLOVE) ×2
GLOVE BIOGEL PI INDICATOR 8 (GLOVE) ×2
GUIDEPIN 3.2X17.5 THRD DISP (PIN) ×3 IMPLANT
GUIDEWIRE BALL NOSE 80CM (WIRE) ×3 IMPLANT
HIP FRA NAIL LAG SCREW 10.5X90 (Orthopedic Implant) ×3 IMPLANT
HIP FRAC NAIL LAG SCR 10.5X100 (Orthopedic Implant) IMPLANT
HIP FRAC NAIL LEFT 11X360MM (Orthopedic Implant) ×3 IMPLANT
KIT BASIN OR (CUSTOM PROCEDURE TRAY) ×3 IMPLANT
KIT TURNOVER KIT A (KITS) IMPLANT
NAIL HIP FRAC LEFT 11X360MM (Orthopedic Implant) ×1 IMPLANT
PACK GENERAL/GYN (CUSTOM PROCEDURE TRAY) ×3 IMPLANT
PACK ORTHO EXTREMITY (CUSTOM PROCEDURE TRAY) ×3 IMPLANT
PAD CAST 4YDX4 CTTN HI CHSV (CAST SUPPLIES) ×1 IMPLANT
PADDING CAST COTTON 4X4 STRL (CAST SUPPLIES) ×2
PENCIL SMOKE EVACUATOR (MISCELLANEOUS) IMPLANT
PROTECTOR NERVE ULNAR (MISCELLANEOUS) ×3 IMPLANT
SCREW CANN THRD AFF 10.5X100 (Orthopedic Implant) IMPLANT
SCREW LAG HIP FRA NAIL 10.5X90 (Orthopedic Implant) ×1 IMPLANT
STAPLER VISISTAT (STAPLE) ×3 IMPLANT
STOCKINETTE 8 INCH (MISCELLANEOUS) ×3 IMPLANT
SUT VIC AB 1 CTX 36 (SUTURE) ×2
SUT VIC AB 1 CTX36XBRD ANBCTR (SUTURE) ×1 IMPLANT
SUT VIC AB 2-0 CT1 27 (SUTURE) ×2
SUT VIC AB 2-0 CT1 27XBRD (SUTURE) ×1 IMPLANT
SUT VIC AB 3-0 CT1 27 (SUTURE) ×4
SUT VIC AB 3-0 CT1 TAPERPNT 27 (SUTURE) ×2 IMPLANT
SYR 50ML LL SCALE MARK (SYRINGE) ×3 IMPLANT
TOWEL OR 17X26 10 PK STRL BLUE (TOWEL DISPOSABLE) ×3 IMPLANT
TRAY CATH 16FR W/PLASTIC CATH (SET/KITS/TRAYS/PACK) ×3 IMPLANT

## 2019-09-14 NOTE — Addendum Note (Signed)
Addended by: Burney Gauze R on: 09/14/2019 01:50 PM   Modules accepted: Orders

## 2019-09-14 NOTE — Transfer of Care (Signed)
Immediate Anesthesia Transfer of Care Note  Patient: Vincent David  Procedure(s) Performed: OPEN REDUCTION INTERNAL FIXATION (ORIF) LEFT HIP WITH AFFIXUS NAIL (Left Leg Upper)  Patient Location: PACU  Anesthesia Type:General  Level of Consciousness: awake, alert , oriented and patient cooperative  Airway & Oxygen Therapy: Patient Spontanous Breathing and Patient connected to face mask oxygen  Post-op Assessment: Report given to RN, Post -op Vital signs reviewed and stable and Patient moving all extremities  Post vital signs: Reviewed and stable  Last Vitals:  Vitals Value Taken Time  BP 130/67 09/14/19 1701  Temp 36.4 C 09/14/19 1700  Pulse 82 09/14/19 1704  Resp 12 09/14/19 1704  SpO2 96 % 09/14/19 1704  Vitals shown include unvalidated device data.  Last Pain:  Vitals:   09/14/19 1203  TempSrc: Oral         Complications: No complications documented.

## 2019-09-14 NOTE — Anesthesia Preprocedure Evaluation (Addendum)
Anesthesia Evaluation  Patient identified by MRN, date of birth, ID band Patient awake    Reviewed: Allergy & Precautions, NPO status , Patient's Chart, lab work & pertinent test results  Airway Mallampati: II  TM Distance: >3 FB Neck ROM: Full    Dental  (+) Dental Advisory Given, Teeth Intact   Pulmonary sleep apnea , Current Smoker,    Pulmonary exam normal breath sounds clear to auscultation       Cardiovascular negative cardio ROS   Rhythm:Regular Rate:Normal + Systolic murmurs    Neuro/Psych  Headaches, Seizures -,  negative neurological ROS  negative psych ROS   GI/Hepatic negative GI ROS, Neg liver ROS,   Endo/Other  negative endocrine ROS  Renal/GU Renal disease  negative genitourinary   Musculoskeletal negative musculoskeletal ROS (+)   Abdominal (+) + obese,   Peds negative pediatric ROS (+)  Hematology negative hematology ROS (+)   Anesthesia Other Findings   Reproductive/Obstetrics negative OB ROS                            Anesthesia Physical Anesthesia Plan  ASA: III  Anesthesia Plan: General   Post-op Pain Management:    Induction: Intravenous  PONV Risk Score and Plan: 2 and Ondansetron, Dexamethasone, Treatment may vary due to age or medical condition and Midazolam  Airway Management Planned: Oral ETT  Additional Equipment: None  Intra-op Plan:   Post-operative Plan: Extubation in OR  Informed Consent: I have reviewed the patients History and Physical, chart, labs and discussed the procedure including the risks, benefits and alternatives for the proposed anesthesia with the patient or authorized representative who has indicated his/her understanding and acceptance.     Dental advisory given  Plan Discussed with: CRNA  Anesthesia Plan Comments:         Anesthesia Quick Evaluation

## 2019-09-14 NOTE — Interval H&P Note (Signed)
History and Physical Interval Note:  09/14/2019 5:28 PM  Vincent David  has presented today for surgery, with the diagnosis of LEFT HIP RENAL CELL CANCER METS FRACTURE.  The various methods of treatment have been discussed with the patient and family. After consideration of risks, benefits and other options for treatment, the patient has consented to  Procedure(s): OPEN REDUCTION INTERNAL FIXATION (ORIF) LEFT HIP WITH AFFIXUS NAIL (Left) as a surgical intervention.  The patient's history has been reviewed, patient examined, no change in status, stable for surgery.  I have reviewed the patient's chart and labs.  Questions were answered to the patient's satisfaction.     Kerin Salen

## 2019-09-14 NOTE — Progress Notes (Signed)
Patient diagnosis confirmed. Reviewed with Dr Marin Olp. He is having surgery today and Dr Marin Olp would like treatment to begin the week of August 23rd. Order placed for port.  Scheduled port for 09/28/19. Scheduled patient education, follow up, nutrition consult and initiation of treatment for 09/29/19.  Called and spoke to [place], patient's wife. Reviewed all upcoming appointments. Reviewed all prep for port including NPO status, arrival time, need for driver and confirmed that patient is not on thinners. Also wrote down all instructions on patient calendar and mailed to patient home.   Reviewed his first treatment day schedule including education, follow up appointment, nutrition and treatment. Explained that she could be with him for education and follow up but that we have no visitors in infusion. Also suggested that patient bring his own lunch as we just have snacks and drinks. She understood. They know to reach out with any questions or concerns.   Oncology Nurse Navigator Documentation  Oncology Nurse Navigator Flowsheets 09/14/2019  Abnormal Finding Date -  Confirmed Diagnosis Date 09/04/2019  Diagnosis Status Confirmed Diagnosis Complete  Planned Course of Treatment Chemotherapy  Phase of Treatment Chemo  Chemotherapy Pending- Reason: Surgeon or Oncologist Initiated  Navigator Follow Up Date: 09/29/2019  Navigator Follow Up Reason: Chemotherapy  Navigator Location [facility]  Referral Date to RadOnc/MedOnc -  Navigator Encounter Type Appt/Treatment Plan Review;Telephone  Telephone Appt Confirmation/Clarification;Outgoing Call  Patient Visit Type MedOnc  Treatment Phase Pre-Tx/Tx Discussion  Barriers/Navigation Needs Coordination of Care;Education;Family Concerns  Education Other  Interventions Coordination of Care;Referrals  Acuity Level 3-Moderate Needs (3-4 Barriers Identified)  Referrals Nutrition/dietician  Coordination of Care Appts;Radiology  Education Method Verbal;Written   Support Groups/Services Friends and Family  Time Spent with Patient 60

## 2019-09-14 NOTE — Anesthesia Postprocedure Evaluation (Signed)
Anesthesia Post Note  Patient: Vincent David  Procedure(s) Performed: OPEN REDUCTION INTERNAL FIXATION (ORIF) LEFT HIP WITH AFFIXUS NAIL (Left Leg Upper)     Patient location during evaluation: PACU Anesthesia Type: General Level of consciousness: sedated and patient cooperative Pain management: pain level controlled Vital Signs Assessment: post-procedure vital signs reviewed and stable Respiratory status: spontaneous breathing Cardiovascular status: stable Anesthetic complications: no   No complications documented.  Last Vitals:  Vitals:   09/14/19 1815 09/14/19 1835  BP: (!) 148/81 (!) 167/81  Pulse: 86 83  Resp: 17 16  Temp: 36.7 C   SpO2: 94% 95%    Last Pain:  Vitals:   09/14/19 1835  TempSrc:   PainSc: [place]

## 2019-09-14 NOTE — Anesthesia Procedure Notes (Signed)
Procedure Name: Intubation Date/Time: 09/14/2019 3:07 PM Performed by: Mitzie Na, CRNA Pre-anesthesia Checklist: Patient identified, Emergency Drugs available, Suction available and Patient being monitored Patient Re-evaluated:Patient Re-evaluated prior to induction Oxygen Delivery Method: Circle system utilized Preoxygenation: Pre-oxygenation with 100% oxygen Induction Type: IV induction Ventilation: Mask ventilation without difficulty and Oral airway inserted - appropriate to patient size Laryngoscope Size: Mac and 4 Grade View: Grade I Tube type: Oral Tube size: 7.5 mm Number of attempts: 1 Airway Equipment and Method: Stylet and Oral airway Placement Confirmation: ETT inserted through vocal cords under direct vision,  positive ETCO2 and breath sounds checked- equal and bilateral Secured at: 24 cm Tube secured with: Tape Dental Injury: Teeth and Oropharynx as per pre-operative assessment

## 2019-09-14 NOTE — Op Note (Signed)
PREOPERATIVE DIAGNOSIS: Impending pathologic fracture left hip intertrochanteric region secondary to to renal cell carcinoma metastasis 1 on the lesser trochanter one in the greater trochanter POSTOPERATIVE DIAGNOSIS: Same  PROCEDURE: Open reduction internal fixation left hip intertrochanteric fracture using a Biomet 11 mm x 360 mm affixus nail , 90 mm lag screw keyed  SURGEON: Erez Mccallum J  ASSISTANT: [employer] (present throughout entire procedure and necessary for timely completion of the procedure) ANESTHESIA: General  BLOOD LOSS: 400 cc cc  FLUID REPLACEMENT: 1600 cc cc crystalloid  DRAINS: Foley Catheter  URINE OUTPUT: [id number]  COMPLICATIONS: none   INDICATIONS FOR PROCEDURE: 54 year old man seen in consultation from Dr. Burney Gauze on Friday for metastatic renal cell carcinoma into the intertrochanteric region of the left hip.  The meds for 1 and 1.5 cm in size one medial one lateral and they did go through the cortex.  Patient was counseled regarding prophylactic nailing to prevent pathological fracture and allow him to begin radiation therapy for his meds which include the left hip the right pelvis as well as the lumbosacral spine.  The risks, benefits, and alternatives were discussed at length including but not limited to the risks of infection, bleeding, nerve injury, stiffness, blood clots, the need for revision surgery, cardiopulmonary complications, among others, and they were willing to proceed. Benefits have been discussed. Questions answered.   PROCEDURE IN DETAIL: The patient was identified by armband,  received preoperative IV antibiotics in the holding area, taken to the operating room , appropriate anesthetic monitors were attached and general endotracheal anesthesia induced. Pt. was then transferred to a radiolucent flat HANA table after application of the traction boots.  The right lower extremity was flexed about 30 degrees and abducted the left lower  extremity was kept in neutral flexion and abducted 30 degrees.  This allowed [place] access to the greater trochanteric region in line with the shaft of the femur.  The left lower extremity was then prepped and draped in usual sterile fashion from the iliac crest down to the lateral aspect of the knee in a C arm or drape was also applied.  Under C-arm control a guidepin was then placed through the tip of the greater trochanter down the shaft of the left femur overreamed with the Biomet initiating reamer the guidepin was then exchanged for the ball-tipped guidepin which was passed down to the metaphyseal region of the left knee.  We then sequentially reamed up to a 13 mm flexible reamer and measured for a 11 mm x 360 mm Biomet affixes nail which was loaded onto the driving platform and driven over the guidepin to the appropriate depth for the lag screw going into the femoral head.  Under C-arm control we set her anteversion so that it was coaxial with the femoral neck and passed a guidepin through the lateral metaphyseal flare of the proximal femur up through the center of the femoral neck to within about 8 mm of the cortex of the femoral head.  We then measured for a 90 mm lag screw.  We overreamed the guidepin with the Biomet lag screw reamer and because the bone was hard used a tap prior to placement of the 90 mm lag screw.  This was then inserted into the appropriate depth and locked proximally.  C arm images were taken confirming position of the nail and the lag screw.  The entry point wound which was about 2-1/2 to 3 cm in length was then irrigated as was the  lag screw wound which was a centimeter and a half in length.  The wounds were closed with running 3-0 Vicryl subcutaneous and subcuticular suture.  Mepilex dressing was applied.  Patient was then transferred off of the [place] bed back into the hospital bed and taken to the recovery room without difficulty.  CC Dr. Burney Gauze

## 2019-09-15 ENCOUNTER — Encounter (HOSPITAL_COMMUNITY): Payer: Self-pay | Admitting: Orthopedic Surgery

## 2019-09-15 ENCOUNTER — Other Ambulatory Visit: Payer: Self-pay

## 2019-09-15 ENCOUNTER — Inpatient Hospital Stay (HOSPITAL_COMMUNITY): Payer: BC Managed Care – PPO

## 2019-09-15 DIAGNOSIS — Z9189 Other specified personal risk factors, not elsewhere classified: Secondary | ICD-10-CM | POA: Diagnosis not present

## 2019-09-15 DIAGNOSIS — Z79899 Other long term (current) drug therapy: Secondary | ICD-10-CM | POA: Diagnosis not present

## 2019-09-15 DIAGNOSIS — C7951 Secondary malignant neoplasm of bone: Secondary | ICD-10-CM | POA: Diagnosis not present

## 2019-09-15 DIAGNOSIS — F17210 Nicotine dependence, cigarettes, uncomplicated: Secondary | ICD-10-CM | POA: Diagnosis not present

## 2019-09-15 DIAGNOSIS — C651 Malignant neoplasm of right renal pelvis: Secondary | ICD-10-CM | POA: Diagnosis not present

## 2019-09-15 DIAGNOSIS — G4730 Sleep apnea, unspecified: Secondary | ICD-10-CM | POA: Diagnosis not present

## 2019-09-15 DIAGNOSIS — Z7982 Long term (current) use of aspirin: Secondary | ICD-10-CM | POA: Diagnosis not present

## 2019-09-15 DIAGNOSIS — M84552A Pathological fracture in neoplastic disease, left femur, initial encounter for fracture: Secondary | ICD-10-CM | POA: Diagnosis not present

## 2019-09-15 LAB — BASIC METABOLIC PANEL
Anion gap: 11 (ref 5–15)
BUN: 16 mg/dL (ref 6–20)
CO2: 26 mmol/L (ref 22–32)
Calcium: 9.6 mg/dL (ref 8.9–10.3)
Chloride: 98 mmol/L (ref 98–111)
Creatinine, Ser: 1.16 mg/dL (ref 0.61–1.24)
GFR calc Af Amer: >60 mL/min (ref >60)
GFR calc non Af Amer: >60 mL/min (ref >60)
Glucose, Bld: 180 mg/dL — ABNORMAL HIGH (ref 70–99)
Potassium: 4.8 mmol/L (ref 3.5–5.1)
Sodium: 135 mmol/L (ref 135–145)

## 2019-09-15 LAB — CBC
HCT: 41.4 % (ref 39.0–52.0)
Hemoglobin: 13.6 g/dL (ref 13.0–17.0)
MCH: 31 pg (ref 26.0–34.0)
MCHC: 32.9 g/dL (ref 30.0–36.0)
MCV: 94.3 fL (ref 80.0–100.0)
Platelets: 193 10*3/uL (ref 150–400)
RBC: 4.39 MIL/uL (ref 4.22–5.81)
RDW: 13.1 % (ref 11.5–15.5)
WBC: 11.7 10*3/uL — ABNORMAL HIGH (ref 4.0–10.5)
nRBC: 0 % (ref 0.0–0.2)

## 2019-09-15 MED ORDER — ZOLEDRONIC ACID 4 MG/5ML IV CONC
4.0000 mg | Freq: Once | INTRAVENOUS | Status: AC
  Administered 2019-09-15: 4 mg via INTRAVENOUS
  Filled 2019-09-15: qty 5

## 2019-09-15 NOTE — Plan of Care (Signed)
Plan of care reviewed and discussed with the patient. 

## 2019-09-15 NOTE — Discharge Instructions (Signed)
Bone Metastasis  Bone metastasis is cancer that has spread from the part of the body where it started to the bones. A person may have bone metastasis in one bone or in more than one bone. Cancer that spreads to the bones is different from cancer that starts in the bones (primary bone cancer). Bone metastasis is more common than primary bone cancer. The spine is the most common area for bone metastasis to occur. Other common areas include:  Hip bone (pelvis).  Ribs.  Skull.  Long bones of the arm or leg. Bone metastasis is painful. It also damages and weakens bones so that they may break easier, even from a minor injury. What are the causes? This condition is caused by cancer cells that spread to the bone. Cancer cells can spread to the rest of the body in two ways:  Through the bloodstream.  Through the vessels that carry white blood cells in the body (lymphatic system). What increases the risk? This condition is more likely to develop in people who have an advanced type of cancer that is known to spread to bone. Cancers that often spread to bone include:  Breast cancer.  Prostate cancer.  Lung cancer.  Thyroid cancer.  Kidney cancer.  Multiple myeloma.  Lymphoma. What are the signs or symptoms? The most common symptom of this condition is bone pain, especially while you are resting. Other symptoms include:  A broken bone (fracture) that happens with little or no trauma.  Low number of red blood cells (anemia). Bone destruction may damage the spongy tissue (bone marrow) in the center of bones where red blood cells are produced. Anemia can cause: ? Weakness. ? Shortness of breath. ? Headache. ? Dizziness.  Back or neck pain with numbness or weakness.  High levels of calcium in your blood (hypercalcemia). When bone is destroyed, calcium is released into your blood. Symptoms of hypercalcemia include: ? Constipation. ? Thirst. ? Nausea. ? Sleepiness. How is this  diagnosed? This condition may be diagnosed based on:  Your symptoms and medical history. Your health care provider may suspect this condition if you are being treated for cancer or have had cancer treatment in the past.  A physical exam.  Imaging studies, such as: ? Bone X-rays, especially in the area where you have pain. ? CT scan. ? Bone scan. ? MRI. ? PET scan.  Blood tests.  Urine tests.  A procedure to remove a piece of bone so it can be examined under a microscope (biopsy). How is this treated? Treatment for this condition depends on your overall health, the type of cancer you have, and how much the cancer has spread. You will work with a team of health care providers to determine which treatment is best for you. Treatment will focus on managing pain, preventing bone weakness, and slowing the spread of the cancer. Treatment may include:  Radiation therapy. This treatment uses X-rays to kill cancer cells. It is most effective for reducing pain, stopping tumor growth, and lowering the risk of fractures.  Radioisotope therapy. This treatment uses a radioactive medicine that is injected into your blood. The medicine travels to areas where cancer cells are active and kills them.  Chemotherapy. For this treatment, you are given cancer-killing medicines. You may have chemotherapy in cycles, with rest periods in between.  Medicines that: ? Help build bone (bisphosphonates and denosumab). These medicines are used to make bones stronger and control bone pain. They may also help to reduce hypercalcemia. ?  Reduce pain (opiates).  Endocrine therapies. These therapies slow cancer growth by blocking specific chemical messengers (hormones). Some types of cancer, including breast and prostate cancers, may grow because of hormones in the body.  Targeted therapy. These drugs are used to block the growth and spread of cancer cells. These drugs target a specific part of the cancer cell and usually  cause fewer side effects than chemotherapy.  Immunotherapies. These therapies use the body's defense system (immune system) to fight cancer cells.  Surgery. You may have surgery to remove bone cancer or to prevent or repair a fracture. Follow these instructions at home:   Take medicines only as directed by your health care provider.  If you are taking prescription pain medicine, take actions to prevent or treat constipation. Your health care provider may recommend that you: ? Drink enough fluid to keep your urine pale yellow. ? Eat foods that are high in fiber, such as fresh fruits and vegetables, whole grains, and beans. ? Limit foods that are high in fat and processed sugars, such as fried and sweet foods. ? Take an over-the-counter or prescription medicine for constipation.  Use devices to help you move around (mobility aids) as needed, such as canes, walkers, or scooters.  Do not drive or use heavy machinery while taking pain medicine.  Do not drink alcohol.  Do not use any products that contain nicotine or tobacco, such as cigarettes and e-cigarettes. If you need help quitting, ask your health care provider.  Keep all follow-up visits as told by your health care provider. This is important. Contact a health care provider if:  Your pain medicine is not helping.  You are not able to care for yourself at home. Get help right away if:  You fall or have an injury.  Your pain suddenly gets worse.  You have trouble walking.  You have numbness or tingling in your legs.  You lose control of your bowels or your bladder.  You are very sleepy or confused. Summary  Bone metastasis is cancer that has spread from the part of the body where it started to the bones. This condition is more common than cancer that starts in the bones (primary bone cancer).  Bone metastasis is painful and damages and weakens the bones. It can also cause anemia and hypercalcemia.  Treatment for this  condition depends on your overall health, the type of cancer you have, and how much the cancer has spread.  Work with your health care team to determine which treatment is best for you. Take all medicines only as told by your health care provider. This information is not intended to replace advice given to you by your health care provider. Make sure you discuss any questions you have with your health care provider. Document Revised: 10/11/2017 Document Reviewed: 02/19/2017 Elsevier Patient Education  [place].

## 2019-09-15 NOTE — Discharge Summary (Signed)
Patient ID: Goble Fudala MRN: [id number] DOB/AGE: 1965/12/26 54 y.o.  Admit date: 09/14/2019 Discharge date: 09/15/2019  Admission Diagnoses:  Principal Problem:   Impending pathologic fracture   Discharge Diagnoses:  Same  Past Medical History:  Diagnosis Date  . Cancer of kidney ([place])   . Goals of care, counseling/discussion 08/28/2019  . Postictal headache   . Seizure ([place])   . Sleep apnea   . Tinnitus     Surgeries: Procedure(s): OPEN REDUCTION INTERNAL FIXATION (ORIF) LEFT HIP WITH AFFIXUS NAIL on 09/14/2019   Consultants: Treatment Team:  Volanda Napoleon, MD  Discharged Condition: Improved  Hospital Course: Jery Hollern is an 54 y.o. male who was admitted 09/14/2019 for operative treatment ofImpending pathologic fracture. Patient has severe unremitting pain that affects sleep, daily activities, and work/hobbies. After pre-op clearance the patient was taken to the operating room on 09/14/2019 and underwent  Procedure(s): OPEN REDUCTION INTERNAL FIXATION (ORIF) LEFT HIP WITH AFFIXUS NAIL.    Patient was given perioperative antibiotics:  Anti-infectives (From admission, onward)   Start     Dose/Rate Route Frequency Ordered Stop   09/14/19 1209  ceFAZolin (ANCEF) 2-4 GM/100ML-% IVPB       Note to Pharmacy: Charmayne Sheer   : cabinet override      09/14/19 1209 09/14/19 1531   09/14/19 1200  ceFAZolin (ANCEF) IVPB 2g/100 mL premix        2 g 200 mL/hr over 30 Minutes Intravenous On call to O.R. 09/14/19 1157 09/14/19 1509       Patient was given sequential compression devices, early ambulation, and chemoprophylaxis to prevent DVT.  Patient benefited maximally from hospital stay and there were no complications.    Recent vital signs:  Patient Vitals for the past 24 hrs:  BP Temp Temp src Pulse Resp SpO2 Height Weight  09/15/19 1315 118/77 98.4 F (36.9 C) Oral 91 16 94 % -- --  09/15/19 0923 (!) 130/58 98.6 F (37 C) Oral 90 20 93 % -- --  09/15/19 0634 137/73  98.1 F (36.7 C) Oral 87 14 95 % -- --  09/15/19 0145 (!) 157/89 98.1 F (36.7 C) Oral 87 16 95 % -- --  09/14/19 2133 (!) 160/82 98.2 F (36.8 C) Oral 89 16 93 % -- --  09/14/19 2039 (!) 160/86 98.2 F (36.8 C) Oral 84 16 96 % -- --  09/14/19 2021 -- -- -- -- -- -- 5' 7"  (1.702 m) 102 kg  09/14/19 1938 (!) 161/73 (!) 97.5 F (36.4 C) Oral 86 14 96 % -- --  09/14/19 1835 (!) 167/81 -- -- 83 16 95 % -- --  09/14/19 1815 (!) 148/81 98 F (36.7 C) -- 86 17 94 % -- --  09/14/19 1800 (!) 144/79 -- -- 78 14 93 % -- --  09/14/19 1745 (!) 150/78 -- -- 79 12 92 % -- --  09/14/19 1730 (!) 141/74 -- -- 81 14 98 % -- --  09/14/19 1715 133/76 -- -- 80 12 98 % -- --  09/14/19 1700 130/67 (!) 97.5 F (36.4 C) -- 85 14 95 % -- --     Recent laboratory studies:  Recent Labs    09/14/19 1225 09/14/19 1225 09/15/19 0322  WBC 10.0  --  11.7*  HGB 14.9  --  13.6  HCT 45.0  --  41.4  PLT 207  --  193  NA 136  --  135  K 4.1  --  4.8  CL 101  --  98  CO2 24  --  26  BUN 17  --  16  CREATININE 1.00  --  1.16  GLUCOSE 109*  --  180*  CALCIUM 10.4*   < > 9.6   < > = values in this interval not displayed.     Discharge Medications:   Allergies as of 09/15/2019   No Known Allergies     Medication List    TAKE these medications   aspirin EC 81 MG tablet Take 1 tablet (81 mg total) by mouth 2 (two) times daily.   gabapentin 300 MG capsule Commonly known as: NEURONTIN Take 1 capsule (300 mg total) by mouth 3 (three) times daily. Notes to patient: Post operative medication used for incisional pain. Please take as prescribed   lamoTRIgine 100 MG tablet Commonly known as: LaMICtal Take 1 tablet (100 mg total) by mouth 2 (two) times daily.   nicotine 7 mg/24hr patch Commonly known as: NICODERM CQ - dosed in mg/24 hr Place 1 patch (7 mg total) onto the skin daily. Notes to patient: Nicotine patch; do not place on skin, and smoke. Thank you!   Oxycodone HCl 10 MG Tabs Take 1 tablet  (10 mg total) by mouth every 4 (four) hours as needed for severe pain. What changed:   reasons to take this  Another medication with the same name was removed. Continue taking this medication, and follow the directions you see here. Notes to patient: Pain medication   tiZANidine 2 MG tablet Commonly known as: ZANAFLEX Take 1 tablet (2 mg total) by mouth every 6 (six) hours as needed. Notes to patient: Muscle relaxer            Durable Medical Equipment  (From admission, onward)         Start     Ordered   09/15/19 0954  For home use only DME Walker rolling  Once       Question Answer Comment  Walker: With 5 Inch Wheels   Patient needs a walker to treat with the following condition Impending pathologic fracture      09/15/19 0953           Discharge Care Instructions  (From admission, onward)         Start     Ordered   09/15/19 0000  Weight bearing as tolerated        09/15/19 1527          Diagnostic Studies: DG Chest 2 View  Result Date: 08/25/2019 CLINICAL DATA:  Destructive bony masses in the pelvis, assessment for primary malignancy. EXAM: CHEST - 2 VIEW COMPARISON:  None. FINDINGS: There is an expansile lytic lesion of the right seventh rib posteromedially. This is likely associated adjacent pleural thickening posteriorly on the lateral projection. Questionable scalloping of the inferior margin of the right sixth rib. The lungs appear otherwise clear. Cardiac and mediastinal margins appear normal. No blunting of the costophrenic angles. IMPRESSION: Expansile lytic lesion of the right seventh rib posteromedially. Questionable scalloping of the inferior margin of the right sixth rib. Appearance suspicious for osseous metastatic lesion given the clinical context. Electronically Signed   By: Van Clines M.D.   On: 08/25/2019 14:29   MR Brain W Wo Contrast  Result Date: 09/07/2019 CLINICAL DATA:  Urologic cancer, staging. EXAM: MRI HEAD WITHOUT AND WITH  CONTRAST TECHNIQUE: Multiplanar, multiecho pulse sequences of the brain and surrounding structures were obtained without and with intravenous contrast. CONTRAST:  75m GADAVIST GADOBUTROL 1  MMOL/ML IV SOLN COMPARISON:  03/10/2019 MRI head. FINDINGS: Brain: Few scattered punctate T2/FLAIR hyperintense foci involving the periventricular and subcortical white matter are nonspecific however commonly associated with chronic microvascular ischemic changes. Cerebral volume is within normal limits. No diffusion-weighted signal abnormality. No intracranial hemorrhage. No midline shift, ventriculomegaly or extra-axial fluid collection. No mass lesion. No abnormal enhancement. Vascular: Normal flow voids. Skull and upper cervical spine: Normal marrow signal. No focal enhancing lesions. Sinuses/Orbits: Normal orbits. Mild frontal and ethmoid mucosal thickening. No mastoid effusion. Other: None. IMPRESSION: No evidence of intracranial metastases. Minimal chronic microvascular ischemic changes. Electronically Signed   By: Primitivo Gauze M.D.   On: 09/07/2019 12:11   MR HIP RIGHT WO CONTRAST  Result Date: 08/24/2019 CLINICAL DATA:  Persistent chronic right hip pain. EXAM: MR OF THE RIGHT HIP WITHOUT CONTRAST TECHNIQUE: Multiplanar, multisequence MR imaging was performed. No intravenous contrast was administered. COMPARISON:  Radiographs 04/06/2019 FINDINGS: Bones: There are a variety of osseous lesions compatible with metastatic disease or myeloma. The largest of these is in the right iliac bone and upper acetabulum, with extensive cortical breakthrough and lytic destruction of the bone, and with the tumor mass measuring 8.0 by 6.4 by 7.1 cm (volume = 190 cm^3). There surrounding soft tissue edema tracking in the adjacent right iliacus and gluteal musculature. A lytic lesion is present in the right L5 vertebral body and right transverse process, measuring about 5.3 by 2.2 by 2.7 cm (volume = 16 cm^3), with only mild if  any extraosseous extension. There are lytic metastatic lesions in the intertrochanteric region of the left proximal femur. One of these has accentuated T2 signal measuring 1.7 cm in diameter with surrounding marrow edema, and a nearby lesion in the greater trochanter measures 2.3 by 1.6 cm on image 15/4. Below the greater trochanter and along the adjacent vastus lateralis, a soft tissue tumor nodule measures about 2.7 by 1.6 cm on image 18/4. Articular cartilage and labrum Articular cartilage:  Unremarkable Labrum:  Grossly unremarkable Joint or bursal effusion Joint effusion:  Absent Bursae: No substantial bursitis identified. Muscles and tendons Muscles and tendons: As noted above, the right iliac tumor is associated with adjacent edema in the iliacus and gluteus minimus muscles. Tumor along the upper margin of the left (contralateral) vastus lateralis. Other findings Miscellaneous:   No obvious mass in the prostate gland. IMPRESSION: 1. Several expansile destructive osseous lesions compatible with skeletal metastatic disease or myeloma. The largest of these is in the right iliac bone and upper acetabulum, with extensive cortical breakthrough and lytic destruction of the bone, and with surrounding soft tissue edema tracking in the adjacent right iliacus and gluteus minimus muscles. 2. Additional metastatic lesions in the right L5 vertebral body and transverse process, left greater trochanter, and along the proximal left vastus lateralis muscle. These results will be called to the ordering clinician or representative by the Radiologist Assistant, and communication documented in the PACS or [employer]. Electronically Signed   By: Van Clines M.D.   On: 08/24/2019 13:03   NM PET Image Initial (PI) Skull Base To Thigh  Result Date: 09/02/2019 CLINICAL DATA:  Initial treatment strategy for osseous metastasis versus multiple myeloma. EXAM: NUCLEAR MEDICINE PET SKULL BASE TO THIGH TECHNIQUE: 11.7 mCi  F-18 FDG was injected intravenously. Full-ring PET imaging was performed from the skull base to thigh after the radiotracer. CT data was obtained and used for attenuation correction and anatomic localization. Fasting blood glucose: 101 mg/dl COMPARISON:  MRI of the right  hip of 08/22/2019. FINDINGS: Mediastinal blood pool activity: SUV max 2.6 Liver activity: SUV max NA NECK: No areas of abnormal hypermetabolism. Incidental CT findings: Bilateral carotid atherosclerosis. No cervical adenopathy. CHEST: No pulmonary parenchymal or thoracic nodal hypermetabolism. Incidental CT findings: Aortic atherosclerosis. Mild centrilobular emphysema. ABDOMEN/PELVIS: No abdominopelvic nodal hypermetabolism. A dominant infiltrative, heterogeneous upper and interpolar left renal mass is not well evaluated at PET secondary to physiologic tracer accumulation. Felt to measure on the order of 8.0 x 6.4 cm on 128/4. Causes mild upper pole left-sided caliectasis. Other bilateral renal lesions are low-density and likely cysts. Incidental CT findings: Abdominal aortic atherosclerosis. Normal caliber of the left renal vein. Mild hepatic steatosis. SKELETON: Extensive hypermetabolic osseous metastasis. Posterior right seventh rib osseous and soft tissue lesion measures 5.5 x 3.9 cm and a S.U.V. max of 6.6 on 86/4. Expansile right iliac mass measures 6.8 x 5.7 cm and a S.U.V. max of 8.5 on 177/4. A left femoral neck lytic lesion with cortical involvement measures 1.5 cm and a S.U.V. max of 8.2 on 203/4. Eccentric right L5 vertebral body lesion measures a S.U.V. max of 8.7 including on 163/4. Incidental CT findings: Degenerative changes of the left hip. IMPRESSION: 1. Constellation of findings, including hypermetabolic osseous lesions and an infiltrative dominant left renal mass which are most consistent with metastatic renal cell carcinoma. 2. No evidence of hypermetabolic soft tissue metastasis. 3. Aortic atherosclerosis (ICD10-I70.0) and  emphysema (ICD10-J43.9). 4. Mild hepatic steatosis. Electronically Signed   By: Abigail Miyamoto M.D.   On: 09/02/2019 16:34   CT Biopsy  Result Date: 09/04/2019 INDICATION: 54 year old male with a history multiple bone lesions including a right iliac mass EXAM: CT BIOPSY MEDICATIONS: None. ANESTHESIA/SEDATION: Moderate (conscious) sedation was employed during this procedure. A total of Versed 1.5 mg and Fentanyl 75 mcg was administered intravenously. Moderate Sedation Time: 11 minutes. The patient's level of consciousness and vital signs were monitored continuously by radiology nursing throughout the procedure under my direct supervision. FLUOROSCOPY TIME:  CT COMPLICATIONS: None PROCEDURE: Informed written consent was obtained from the patient after a thorough discussion of the procedural risks, benefits and alternatives. All questions were addressed. Maximal Sterile Barrier Technique was utilized including caps, mask, sterile gowns, sterile gloves, sterile drape, hand hygiene and skin antiseptic. A timeout was performed prior to the initiation of the procedure. Patient positioned supine position on the CT gantry table. Scout CT was acquired for planning purposes. Once the patient is prepped and draped in the usual sterile fashion, 1% lidocaine was used for local anesthesia. Guide needle was advanced tangentially to the right iliac mass, targeting the FDG avid material on prior CT. Multiple 16 gauge core biopsy were acquired. Needle was removed and a final image was stored. Patient tolerated the procedure well and remained hemodynamically stable throughout. No complications were encountered and no significant blood loss. IMPRESSION: Status post CT-guided biopsy of right iliac mass. Signed, Dulcy Fanny. Dellia Nims, RPVI [facility] Electronically Signed   By: Corrie Mckusick D.O.   On: 09/04/2019 12:38   DG C-Arm 1-60 Min-No Report  Result Date:  09/14/2019 Fluoroscopy was utilized by the requesting physician.  No radiographic interpretation.   DG FEMUR MIN 2 VIEWS LEFT  Result Date: 09/14/2019 CLINICAL DATA:  ORIF EXAM: LEFT FEMUR 2 VIEWS COMPARISON:  None. FINDINGS: Intraoperative spot images demonstrate placement of dynamic hip screw and intramedullary nail in the left femur. No visible hardware complicating feature. IMPRESSION: As above. Electronically Signed  By: Rolm Baptise M.D.   On: 09/14/2019 17:57    Disposition: Discharge disposition: 01-Home or Self Care       Discharge Instructions    Call MD / Call 911   Complete by: As directed    If you experience chest pain or shortness of breath, CALL 911 and be transported to the hospital emergency room.  If you develope a fever above 101 F, pus (white drainage) or increased drainage or redness at the wound, or calf pain, call your surgeon's office.   Constipation Prevention   Complete by: As directed    Drink plenty of fluids.  Prune juice may be helpful.  You may use a stool softener, such as Colace (over the counter) 100 mg twice a day.  Use MiraLax (over the counter) for constipation as needed.   Diet - low sodium heart healthy   Complete by: As directed    Driving restrictions   Complete by: As directed    No driving for 2 weeks   Increase activity slowly as tolerated   Complete by: As directed    Patient may shower   Complete by: As directed    You may shower without a dressing once there is no drainage.  Do not wash over the wound.  If drainage remains, cover wound with plastic wrap and then shower.   Weight bearing as tolerated   Complete by: As directed        Follow-up Information    Frederik Pear, MD In 2 weeks.   Specialty: Orthopedic Surgery Contact information: [place] [address] [phone]                Signed: Joanell Rising 09/15/2019, 3:28 PM

## 2019-09-15 NOTE — Consult Note (Signed)
Referral MD  Reason for Referral: Metastatic kidney cancer-status post left hip pinning for impending pathologic fracture  No chief complaint on file. : I just had surgery for my left hip.  HPI: Vincent David is well-known to me.  He is very nice 54 year old white male.  He has metastatic kidney cancer.  His disease was initially found when he began to have pain in the right hip.  He has a large tumor involving the right iliac region.  As part of his work-up, he had a PET scan done.  This done on 09/02/2019.  This showed areas of disease.  What was concerning was in the left femoral neck, there was a lytic lesion with cortical involvement.  Because he puts a lot of pressure on his left side because of the right iliac tumor, I was worried that he would fracture this area.  We referred him to [facility].  The saw him quickly.  They went ahead and got him in for surgery on Monday.  Dr. Mayer Camel did a fantastic job.  The left femur was prophylactically pinned.  Vincent David feels well.  He has been out of bed.  Dr. Paulo Fruit is worried about the right iliac mass.  If this causes the fracture in the iliac area, this is really going to cause problems.  There was some discussion about maybe trying to embolize the tumor.  I am not sure if interventional radiology can do that.  I will have to talk to them.  While Vincent David is in the hospital, we will go ahead and give him a dose of Zometa.  He will get Delton See as an outpatient.  He has had no problems with cough or shortness of breath.  He has had no change in bowel or bladder habits.  There is no rash.  He has had no headache.  His labs today show white cell count 11.7.  Hemoglobin 13.6.  Platelet count 193,000.  His blood sugar is 180.  BUN 16 creatinine 1.16.  Calcium is 9.6.  He has had no issues with nausea or vomiting.  He did have some bleeding during the surgery as renal cell carcinoma's and required vascular.  Overall, his performance  status is ECOG 1.    Past Medical History:  Diagnosis Date   Cancer of kidney ([place])    Goals of care, counseling/discussion 08/28/2019   Postictal headache    Seizure ([place])    Sleep apnea    Tinnitus   :  Past Surgical History:  Procedure Laterality Date   KNEE SURGERY Left    SHOULDER SURGERY Left   :   Current Facility-Administered Medications:    acetaminophen (TYLENOL) tablet 325-650 mg, 325-650 mg, Oral, Q6H PRN, Leighton Parody, PA-C   alum & mag hydroxide-simeth (MAALOX/MYLANTA) 200-200-20 MG/5ML suspension 30 mL, 30 mL, Oral, Q4H PRN, Leighton Parody, PA-C   aspirin EC tablet 81 mg, 81 mg, Oral, BID, Leighton Parody, PA-C, 81 mg at 09/14/19 2201   bisacodyl (DULCOLAX) EC tablet 5 mg, 5 mg, Oral, Daily PRN, Joanell Rising K, PA-C   dextrose 5 % and 0.45 % NaCl with KCl 20 mEq/L infusion, , Intravenous, Continuous, Leighton Parody, PA-C, Last Rate: 100 mL/hr at 09/15/19 0200, Rate Verify at 09/15/19 0200   docusate sodium (COLACE) capsule 100 mg, 100 mg, Oral, BID, Leighton Parody, PA-C, 100 mg at 09/14/19 2201   gabapentin (NEURONTIN) capsule 300 mg, 300 mg, Oral, TID, Leighton Parody,  PA-C, 300 mg at 09/14/19 2201   HYDROmorphone (DILAUDID) injection 0.5-1 mg, 0.5-1 mg, Intravenous, Q4H PRN, Leighton Parody, PA-C, 1 mg at 09/14/19 1905   lamoTRIgine (LAMICTAL) tablet 100 mg, 100 mg, Oral, BID, Leighton Parody, PA-C, 100 mg at 09/14/19 2201   menthol-cetylpyridinium (CEPACOL) lozenge 3 mg, 1 lozenge, Oral, PRN **OR** phenol (CHLORASEPTIC) mouth spray 1 spray, 1 spray, Mouth/Throat, PRN, Leighton Parody, PA-C   methocarbamol (ROBAXIN) tablet 500 mg, 500 mg, Oral, Q6H PRN, 500 mg at 09/15/19 0158 **OR** methocarbamol (ROBAXIN) 500 mg in dextrose 5 % 50 mL IVPB, 500 mg, Intravenous, Q6H PRN, Leighton Parody, PA-C, Stopped at 09/14/19 1754   metoCLOPramide (REGLAN) tablet 5-10 mg, 5-10 mg, Oral, Q8H PRN **OR** metoCLOPramide (REGLAN) injection 5-10 mg,  5-10 mg, Intravenous, Q8H PRN, Joanell Rising K, PA-C   nicotine (NICODERM CQ - dosed in mg/24 hr) patch 7 mg, 7 mg, Transdermal, Daily, Hardin Negus, Eric K, PA-C   ondansetron [facility]) tablet 4 mg, 4 mg, Oral, Q6H PRN **OR** ondansetron (ZOFRAN) injection 4 mg, 4 mg, Intravenous, Q6H PRN, Leighton Parody, PA-C   oxyCODONE (Oxy IR/ROXICODONE) immediate release tablet 10-15 mg, 10-15 mg, Oral, Q4H PRN, Leighton Parody, PA-C   oxyCODONE (Oxy IR/ROXICODONE) immediate release tablet 5-10 mg, 5-10 mg, Oral, Q4H PRN, Leighton Parody, PA-C, 10 mg at 09/15/19 0158   pantoprazole (PROTONIX) EC tablet 40 mg, 40 mg, Oral, Daily, Hardin Negus, Eric K, PA-C   senna-docusate (Senokot-S) tablet 1 tablet, 1 tablet, Oral, QHS PRN, Leighton Parody, PA-C:   aspirin EC  81 mg Oral BID   docusate sodium  100 mg Oral BID   gabapentin  300 mg Oral TID   lamoTRIgine  100 mg Oral BID   nicotine  7 mg Transdermal Daily   pantoprazole  40 mg Oral Daily  :  No Known Allergies:  Family History  Problem Relation Age of Onset   Sleep apnea Mother    Diabetes Mother    High blood pressure Father   :  Social History   Socioeconomic History   Marital status: Married    Spouse name: Otila Kluver   Number of children: 3   Years of education: Not on file   Highest education level: Not on file  Occupational History    Employer: OTHER  Tobacco Use   Smoking status: Current Every Day Smoker    Packs/day: 0.50    Types: Cigarettes   Smokeless tobacco: Never Used   Tobacco comment: nicotine  Vaping Use   Vaping Use: Never used  Substance and Sexual Activity   Alcohol use: Yes    Alcohol/week: 4.0 standard drinks    Types: 4 Standard drinks or equivalent per week   Drug use: Never   Sexual activity: Yes    Partners: Female  Other Topics Concern   Not on file  Social History Narrative   Not on file   Social Determinants of Health   Financial Resource Strain:    Difficulty of Paying  Living Expenses:   Food Insecurity:    Worried About Charity fundraiser in the Last Year:    Arboriculturist in the Last Year:   Transportation Needs:    Film/video editor (Medical):    Lack of Transportation (Non-Medical):   Physical Activity:    Days of Exercise per Week:    Minutes of Exercise per Session:   Stress:    Feeling of Stress :   Social  Connections:    Frequency of Communication with Friends and Family:    Frequency of Social Gatherings with Friends and Family:    Attends Religious Services:    Active Member of Clubs or Organizations:    Attends Music therapist:    Marital Status:   Intimate Partner Violence:    Fear of Current or Ex-Partner:    Emotionally Abused:    Physically Abused:    Sexually Abused:   :  Review of Systems  Constitutional: Negative.   HENT: Negative.   Eyes: Negative.   Respiratory: Negative.   Cardiovascular: Negative.   Gastrointestinal: Negative.   Genitourinary: Negative.   Musculoskeletal: Positive for joint pain.  Skin: Negative.   Neurological: Negative.   Endo/Heme/Allergies: Negative.   Psychiatric/Behavioral: Negative.      Exam:  This is a well-developed and well-nourished white male in no obvious distress.  His vital signs are temperature of 98.1.  Pulse 87.  Blood pressure 157/89.  Head neck exam shows no scleral icterus.  There is no adenopathy in the neck.  He has no oral lesions.  Lungs are clear bilaterally.  Cardiac exam regular rate and rhythm.  Abdomen is soft.  He is mildly obese.  There is no fluid wave.  There is no palpable liver or spleen tip.  Extremities show the dressings over the lateral left hip.  Neurological exam is nonfocal.  Skin exam shows no rashes, ecchymoses or petechia.   Patient Vitals for the past 24 hrs:  BP Temp Temp src Pulse Resp SpO2 Height Weight  09/15/19 0145 (!) 157/89 98.1 F (36.7 C) Oral 87 16 95 % -- --  09/14/19 2133 (!) 160/82 98.2 F  (36.8 C) Oral 89 16 93 % -- --  09/14/19 2039 (!) 160/86 98.2 F (36.8 C) Oral 84 16 96 % -- --  09/14/19 2021 -- -- -- -- -- -- 5\' 7"  (1.702 m) 224 lb 13.9 oz (102 kg)  09/14/19 1938 (!) 161/73 (!) 97.5 F (36.4 C) Oral 86 14 96 % -- --  09/14/19 1835 (!) 167/81 -- -- 83 16 95 % -- --  09/14/19 1815 (!) 148/81 98 F (36.7 C) -- 86 17 94 % -- --  09/14/19 1800 (!) 144/79 -- -- 78 14 93 % -- --  09/14/19 1745 (!) 150/78 -- -- 79 12 92 % -- --  09/14/19 1730 (!) 141/74 -- -- 81 14 98 % -- --  09/14/19 1715 133/76 -- -- 80 12 98 % -- --  09/14/19 1700 130/67 (!) 97.5 F (36.4 C) -- 85 14 95 % -- --  09/14/19 1203 (!) 181/90 98.1 F (36.7 C) Oral 96 17 99 % -- --     Recent Labs    09/14/19 1225 09/15/19 0322  WBC 10.0 11.7*  HGB 14.9 13.6  HCT 45.0 41.4  PLT 207 193   Recent Labs    09/14/19 1225 09/15/19 0322  NA 136 135  K 4.1 4.8  CL 101 98  CO2 24 26  GLUCOSE 109* 180*  BUN 17 16  CREATININE 1.00 1.16  CALCIUM 10.4* 9.6    Blood smear review: None  Pathology: None    Assessment and Plan: Vincent David is a 53 year old white male.  He has metastatic kidney cancer.  His disease appears to be fine to the bones.  Again was in the hospital like him a dose of Zometa.  He will need a bone hardener.  Since we have  in the hospital we we will go ahead and give him a dose of Zometa.  I will have to speak to interventional radiology and see if they can actually do the embolization.  I think this might take a few days if they can do it.  As such, if Vincent David is ready for discharge home orthopedic point of view, he concerning go home and come back later.  I have spoken to Dr. Sondra Come of Radiation Oncology.  He is aware of Vincent David.  He can start radiation over to the right hip area soon.  The radiation to the left hip by we will have to wait 2 weeks so that healing can occur after surgery.  Again I am very much impressed with how quickly Dr. Mayer Camel was able to operate on Mr.  David.  I am very grateful for Dr. Damita Dunnings expertise and care.  Lattie Haw, MD  Psalm 56:3

## 2019-09-15 NOTE — Evaluation (Signed)
Physical Therapy Evaluation Patient Details Name: Vincent David MRN: [id number] DOB: 1965-04-15 Today's Date: 09/15/2019   History of Present Illness  Pt is a 54 year old male s/p OPEN REDUCTION INTERNAL FIXATION (ORIF) LEFT HIP WITH AFFIXUS NAIL and also with Extensive metastasis from renal cell carcinoma to the right ilium with impending pathological fracture.  Pt with history of metastatic kidney cancer  Clinical Impression  Pt admitted with above diagnosis.  Pt currently with functional limitations due to the deficits listed below (see PT Problem List). Pt will benefit from skilled PT to increase their independence and safety with mobility to allow discharge to the venue listed below.  Pt educated on WB status and recommended using RW upon d/c home since he was heavily dependent on UE use during ambulation.     Follow Up Recommendations No PT follow up    Equipment Recommendations  Rolling walker with 5" wheels;Other (comment) (would like elevated toilet)    Recommendations for Other Services       Precautions / Restrictions Precautions Precautions: Fall Restrictions Weight Bearing Restrictions: Yes RLE Weight Bearing: Partial weight bearing RLE Partial Weight Bearing Percentage or Pounds: <50% Other Position/Activity Restrictions: surgical L LE is WBAT and R LE PWB due to impending pathological fx      Mobility  Bed Mobility Overal bed mobility: Modified Independent                Transfers Overall transfer level: Needs assistance Equipment used: Rolling walker (2 wheeled) Transfers: Sit to/from Stand Sit to Stand: Min guard         General transfer comment: cues for safe technique  Ambulation/Gait Ambulation/Gait assistance: Min guard Gait Distance (Feet): 120 Feet Assistive device: Rolling walker (2 wheeled) Gait Pattern/deviations: Step-to pattern     General Gait Details: heavy use of upper body on RW, distance to tolerance  Stairs             Wheelchair Mobility    Modified Rankin (Stroke Patients Only)       Balance                                             Pertinent Vitals/Pain Pain Assessment: Faces Faces Pain Scale: Hurts little more Pain Location: L hip Pain Descriptors / Indicators: Aching;Grimacing;Sore Pain Intervention(s): Monitored during session;Repositioned    Home Living Family/patient expects to be discharged to:: Private residence Living Arrangements: Spouse/significant other   Type of Home: House Home Access: Ramped entrance     Home Layout: One level Home Equipment: Crutches;Cane - single point      Prior Function Level of Independence: Independent with assistive device(s)               Hand Dominance        Extremity/Trunk Assessment        Lower Extremity Assessment Lower Extremity Assessment: LLE deficits/detail;RLE deficits/detail RLE Deficits / Details: educated on PWB status for R LE LLE Deficits / Details: anticipated post op weakness    Cervical / Trunk Assessment Cervical / Trunk Assessment: Normal  Communication   Communication: No difficulties  Cognition Arousal/Alertness: Awake/alert Behavior During Therapy: Flat affect Overall Cognitive Status: Within Functional Limits for tasks assessed  General Comments      Exercises     Assessment/Plan    PT Assessment Patient needs continued PT services  PT Problem List Decreased strength;Decreased mobility;Decreased knowledge of use of DME;Pain;Decreased knowledge of precautions       PT Treatment Interventions DME instruction;Gait training;Balance training;Therapeutic exercise;Functional mobility training;Patient/family education;Therapeutic activities;Stair training    PT Goals (Current goals can be found in the Care Plan section)  Acute Rehab PT Goals PT Goal Formulation: With patient Time For Goal Achievement:  09/19/19 Potential to Achieve Goals: Good    Frequency Min 4X/week   Barriers to discharge        Co-evaluation               AM-PAC PT "6 Clicks" Mobility  Outcome Measure Help needed turning from your back to your side while in a flat bed without using bedrails?: None Help needed moving from lying on your back to sitting on the side of a flat bed without using bedrails?: None Help needed moving to and from a bed to a chair (including a wheelchair)?: A Little Help needed standing up from a chair using your arms (e.g., wheelchair or bedside chair)?: A Little Help needed to walk in hospital room?: A Little Help needed climbing 3-5 steps with a railing? : A Little 6 Click Score: 20    End of Session Equipment Utilized During Treatment: Gait belt Activity Tolerance: Patient tolerated treatment well Patient left: in chair;with call bell/phone within reach;with chair alarm set Nurse Communication: Mobility status PT Visit Diagnosis: Difficulty in walking, not elsewhere classified (R26.2)    Time: 1610-9604 PT Time Calculation (min) (ACUTE ONLY): 12 min   Charges:   PT Evaluation $PT Eval Low Complexity: 1 Low     Kati PT, DPT Acute Rehabilitation Services Pager: [phone] Office: [phone]  York Ram E 09/15/2019, 1:03 PM

## 2019-09-15 NOTE — Progress Notes (Addendum)
RN reviewed discharge instructions with patient and friend. All questions answered.   Paperwork given. Prescriptions electronically sent to patient pharmacy.   No HHPT arranged, clarified with PA. No PT recommendations from Hospital PT.   NT rolled patient down with all belongings to family car.    Patient left floor at 1725.   Benedetto Goad, RN

## 2019-09-15 NOTE — Progress Notes (Signed)
Patient ID: Vincent David, male   DOB: [birth year], 54 y.o.   MRN: [id number] Request received from oncology for consideration of embolization of symptomatic right iliac mass secondary to metastatic clear-cell renal cell carcinoma.  Patient familiar to IR service from right iliac mass biopsy on 09/04/2019.  Latest imaging studies have been reviewed by Dr. Kathlene Cote.  He feels that embolization procedure would not help much with this particular metastasis. It is too large and has eroded through the roof of the acetabulum.  If radiation therapy does not provide some relief he will need an experienced oncologic orthopedist to do debulking and reconstructive surgery.

## 2019-09-15 NOTE — Progress Notes (Signed)
PATIENT ID: Vincent David  MRN: [id number]  DOB/AGE:  1966/02/05 / 54 y.o.  1 Day Post-Op Procedure(s) (LRB): OPEN REDUCTION INTERNAL FIXATION (ORIF) LEFT HIP WITH AFFIXUS NAIL (Left)    PROGRESS NOTE Subjective: Patient is alert, oriented, no Nausea, no Vomiting, yes passing gas, . Taking PO well. Denies SOB, Chest or Calf Pain. Using Incentive Spirometer, PAS in place. Ambulate WBAT LLE, 50% WB RLE Patient reports pain as  2/10  .    Objective: Vital signs in last 24 hours: Vitals:   09/14/19 2039 09/14/19 2133 09/15/19 0145 09/15/19 0634  BP: (Abnormal) 160/86 (Abnormal) 160/82 (Abnormal) 157/89 137/73  Pulse: 84 89 87 87  Resp: 16 16 16 14   Temp: 98.2 F (36.8 C) 98.2 F (36.8 C) 98.1 F (36.7 C) 98.1 F (36.7 C)  TempSrc: Oral Oral Oral Oral  SpO2: 96% 93% 95% 95%  Weight:      Height:          Intake/Output from previous day: I/O last 3 completed shifts: In: 3214.8 [P.O.:600; I.V.:2414.8; IV Piggyback:200] Out: 2750 [Urine:2550; Blood:200]   Intake/Output this shift: No intake/output data recorded.   LABORATORY DATA: Recent Labs    09/14/19 1225 09/14/19 1225 09/15/19 0322  WBC 10.0  --  11.7*  HGB 14.9  --  13.6  HCT 45.0  --  41.4  PLT 207  --  193  NA 136  --  135  K 4.1  --  4.8  CL 101  --  98  CO2 24  --  26  BUN 17  --  16  CREATININE 1.00  --  1.16  GLUCOSE 109*  --  180*  CALCIUM 10.4*   < > 9.6   < > = values in this interval not displayed.    Examination: Neurologically intact ABD soft Neurovascular intact Sensation intact distally Intact pulses distally Dorsiflexion/Plantar flexion intact Incision: dressing C/D/I No cellulitis present Compartment soft} XR AP&Lat of hip shows well placed\fixed L femoral rod  Assessment:   1 Day Post-Op Procedure(s) (LRB): OPEN REDUCTION INTERNAL FIXATION (ORIF) LEFT HIP WITH AFFIXUS NAIL (Left)   2.  Extensive metastasis from renal cell carcinoma to the right ilium with impending pathological  fracture and significant pain  ADDITIONAL DIAGNOSIS:  Expected Acute Blood Loss Anemia, smoker, OSA, seizures  Patient's anticipated LOS is less than 2 midnights, meeting these requirements: - Younger than 76 - Lives within 1 hour of care - Has a competent adult at home to recover with post-op recover - NO history of  - Chronic pain requiring opiods  - Diabetes  - Coronary Artery Disease  - Heart failure  - Heart attack  - Stroke  - DVT/VTE  - Cardiac arrhythmia  - Respiratory Failure/COPD  - Renal failure  - Anemia  - Advanced Liver disease       Plan: Patient will be weightbearing as tolerated on the left side which has the femoral rod.  On the right side he is 50% weightbearing with crutches or a walker secondary to the metastasis to the ileum.  Today he will be evaluated by radiation therapy and oncology for possible consideration of embolization of the metastasis to the ileum or early radiation therapy.  After evaluation he may be discharged home today or admitted as an inpatient for radiation therapy and/or embolization.  DVT Prophylaxis: SCDx72 hrs, ASA 81 mg BID x 2 weeks  DISCHARGE PLAN: Home  DISCHARGE NEEDS: HHPT, Walker and 3-in-1 comode seat

## 2019-09-22 DIAGNOSIS — C642 Malignant neoplasm of left kidney, except renal pelvis: Secondary | ICD-10-CM | POA: Diagnosis not present

## 2019-09-22 NOTE — Progress Notes (Signed)
Pharmacist Chemotherapy Monitoring - Initial Assessment    Anticipated start date: 09/29/19   Regimen:  . Are orders appropriate based on the patient's diagnosis, regimen, and cycle? Yes . Does the plan date match the patient's scheduled date? Yes . Is the sequencing of drugs appropriate? Yes . Are the premedications appropriate for the patient's regimen? Yes . Prior Authorization for treatment is: Approved o If applicable, is the correct biosimilar selected based on the patient's insurance? not applicable  Organ Function and Labs: [name] Are dose adjustments needed based on the patient's renal function, hepatic function, or hematologic function? No . Are appropriate labs ordered prior to the start of patient's treatment? Yes . Other organ system assessment, if indicated: N/A . The following baseline labs, if indicated, have been ordered: ipilimumab: baseline TSH +/- T4  Dose Assessment: . Are the drug doses appropriate? Yes . Are the following correct: o Drug concentrations Yes o IV fluid compatible with drug Yes o Administration routes Yes o Timing of therapy Yes . If applicable, does the patient have documented access for treatment and/or plans for port-a-cath placement? yes . If applicable, have lifetime cumulative doses been properly documented and assessed? not applicable Lifetime Dose Tracking  No doses have been documented on this patient for the following tracked chemicals: Doxorubicin, Epirubicin, Idarubicin, Daunorubicin, Mitoxantrone, Bleomycin, Oxaliplatin, Carboplatin, Liposomal Doxorubicin  o   Toxicity Monitoring/Prevention: . The patient has the following take home antiemetics prescribed: N/A . The patient has the following take home medications prescribed: N/A . Medication allergies and previous infusion related reactions, if applicable, have been reviewed and addressed. Yes . The patient's current medication list has been assessed for drug-drug interactions with their  chemotherapy regimen. no significant drug-drug interactions were identified on review.  Order Review: . Are the treatment plan orders signed? No . Is the patient scheduled to see a provider prior to their treatment? Yes  I verify that I have reviewed each item in the above checklist and answered each question accordingly.  Janiaya Ryser, Jacqlyn Larsen 09/22/2019 2:52 PM

## 2019-09-25 ENCOUNTER — Other Ambulatory Visit: Payer: Self-pay | Admitting: Student

## 2019-09-25 ENCOUNTER — Other Ambulatory Visit: Payer: Self-pay | Admitting: Radiology

## 2019-09-28 ENCOUNTER — Ambulatory Visit (HOSPITAL_COMMUNITY)
Admit: 2019-09-28 | Discharge: 2019-09-28 | Disposition: A | Discharge status: Home / Self Care | Payer: BC Managed Care – PPO | Attending: Hematology & Oncology | Admitting: Hematology & Oncology

## 2019-09-28 ENCOUNTER — Ambulatory Visit (HOSPITAL_COMMUNITY)
Admission: RE | Admit: 2019-09-28 | Discharge: 2019-09-28 | Disposition: A | Discharge status: Home / Self Care | Payer: BC Managed Care – PPO | Source: Ambulatory Visit | Attending: Hematology & Oncology | Admitting: Hematology & Oncology

## 2019-09-28 ENCOUNTER — Other Ambulatory Visit: Payer: Self-pay | Admitting: Hematology & Oncology

## 2019-09-28 ENCOUNTER — Ambulatory Visit: Payer: BC Managed Care – PPO | Admitting: Family Medicine

## 2019-09-28 ENCOUNTER — Other Ambulatory Visit: Payer: Self-pay | Admitting: *Deleted

## 2019-09-28 ENCOUNTER — Other Ambulatory Visit: Payer: Self-pay

## 2019-09-28 DIAGNOSIS — C649 Malignant neoplasm of unspecified kidney, except renal pelvis: Secondary | ICD-10-CM | POA: Diagnosis not present

## 2019-09-28 DIAGNOSIS — R569 Unspecified convulsions: Secondary | ICD-10-CM | POA: Insufficient documentation

## 2019-09-28 DIAGNOSIS — Z5111 Encounter for antineoplastic chemotherapy: Secondary | ICD-10-CM | POA: Diagnosis not present

## 2019-09-28 DIAGNOSIS — C642 Malignant neoplasm of left kidney, except renal pelvis: Secondary | ICD-10-CM

## 2019-09-28 DIAGNOSIS — Z79899 Other long term (current) drug therapy: Secondary | ICD-10-CM | POA: Diagnosis not present

## 2019-09-28 DIAGNOSIS — Z7982 Long term (current) use of aspirin: Secondary | ICD-10-CM | POA: Insufficient documentation

## 2019-09-28 DIAGNOSIS — C7951 Secondary malignant neoplasm of bone: Secondary | ICD-10-CM | POA: Diagnosis not present

## 2019-09-28 DIAGNOSIS — G4730 Sleep apnea, unspecified: Secondary | ICD-10-CM | POA: Insufficient documentation

## 2019-09-28 DIAGNOSIS — Z452 Encounter for adjustment and management of vascular access device: Secondary | ICD-10-CM | POA: Diagnosis not present

## 2019-09-28 DIAGNOSIS — H9319 Tinnitus, unspecified ear: Secondary | ICD-10-CM | POA: Insufficient documentation

## 2019-09-28 DIAGNOSIS — F17210 Nicotine dependence, cigarettes, uncomplicated: Secondary | ICD-10-CM | POA: Insufficient documentation

## 2019-09-28 HISTORY — PX: IR IMAGING GUIDED PORT INSERTION: IMG5740

## 2019-09-28 MED ORDER — MIDAZOLAM HCL 2 MG/2ML IJ SOLN
INTRAMUSCULAR | Status: AC
  Filled 2019-09-28: qty 4

## 2019-09-28 MED ORDER — SODIUM CHLORIDE 0.9 % IV SOLN: INTRAVENOUS | Status: DC

## 2019-09-28 MED ORDER — DIPHENHYDRAMINE HCL 50 MG/ML IJ SOLN
INTRAMUSCULAR | Status: AC | PRN
  Administered 2019-09-28: 25 mg via INTRAVENOUS

## 2019-09-28 MED ORDER — MIDAZOLAM HCL 2 MG/2ML IJ SOLN
INTRAMUSCULAR | Status: AC | PRN
  Administered 2019-09-28: 2 mg via INTRAVENOUS
  Administered 2019-09-28: 1 mg via INTRAVENOUS

## 2019-09-28 MED ORDER — DIPHENHYDRAMINE HCL 50 MG/ML IJ SOLN
INTRAMUSCULAR | Status: AC
  Filled 2019-09-28: qty 1

## 2019-09-28 MED ORDER — LIDOCAINE-EPINEPHRINE 1 %-1:100000 IJ SOLN
INTRAMUSCULAR | Status: AC
  Filled 2019-09-28: qty 1

## 2019-09-28 MED ORDER — HEPARIN SOD (PORK) LOCK FLUSH 100 UNIT/ML IV SOLN
INTRAVENOUS | Status: AC | PRN
  Administered 2019-09-28: 500 [IU] via INTRAVENOUS

## 2019-09-28 MED ORDER — LIDOCAINE-EPINEPHRINE 1 %-1:100000 IJ SOLN
INTRAMUSCULAR | Status: AC | PRN
  Administered 2019-09-28: 20 mL via INTRADERMAL

## 2019-09-28 MED ORDER — FENTANYL CITRATE (PF) 100 MCG/2ML IJ SOLN
INTRAMUSCULAR | Status: DC
Start: 2019-09-28 — End: 2019-09-29
  Filled 2019-09-28: qty 2

## 2019-09-28 MED ORDER — HEPARIN SOD (PORK) LOCK FLUSH 100 UNIT/ML IV SOLN
INTRAVENOUS | Status: AC
  Filled 2019-09-28: qty 5

## 2019-09-28 MED ORDER — CEFAZOLIN SODIUM-DEXTROSE 2-4 GM/100ML-% IV SOLN: 2.0000 g | INTRAVENOUS | Status: AC

## 2019-09-28 MED ORDER — FENTANYL CITRATE (PF) 100 MCG/2ML IJ SOLN
INTRAMUSCULAR | Status: AC | PRN
Start: 2019-09-28 — End: 2019-09-28
  Administered 2019-09-28 (×2): 50 ug via INTRAVENOUS

## 2019-09-28 MED ORDER — CEFAZOLIN SODIUM-DEXTROSE 2-4 GM/100ML-% IV SOLN
INTRAVENOUS | Status: AC
  Administered 2019-09-28: 2 g via INTRAVENOUS
  Filled 2019-09-28: qty 100

## 2019-09-28 NOTE — Procedures (Signed)
Pre Procedure Dx: Poor venous access Post Procedural Dx: Same  Successful placement of right IJ approach port-a-cath with tip at the superior caval atrial junction. The catheter is ready for immediate use.  Estimated Blood Loss: Minimal  Complications: None immediate.  Jay Josette Shimabukuro, MD Pager #: [phone]   

## 2019-09-28 NOTE — Discharge Instructions (Signed)
For questions /concerns may call Interventional Radiology at [phone]  You may remove your dressing and shower tomorrow afternoon  DO NOT use EMLA cream for 2 weeks after port placement as the cream will remove surgical glue on your incision.    Implanted Port Insertion, Care After This sheet gives you information about how to care for yourself after your procedure. Your health care provider may also give you more specific instructions. If you have problems or questions, contact your health care provider. What can I expect after the procedure? After the procedure, it is common to have:  Discomfort at the port insertion site.  Bruising on the skin over the port. This should improve over 3-4 days. Follow these instructions at home: Port care  After your port is placed, you will get a manufacturer's information card. The card has information about your port. Keep this card with you at all times.  Take care of the port as told by your health care provider. Ask your health care provider if you or a family member can get training for taking care of the port at home. A home health care nurse may also take care of the port.  Make sure to remember what type of port you have. Incision care      Follow instructions from your health care provider about how to take care of your port insertion site. Make sure you: ? Wash your hands with soap and water before and after you change your bandage (dressing). If soap and water are not available, use hand sanitizer. ? Change your dressing as told by your health care provider. ? Leave stitches (sutures), skin glue, or adhesive strips in place. These skin closures may need to stay in place for 2 weeks or longer. If adhesive strip edges start to loosen and curl up, you may trim the loose edges. Do not remove adhesive strips completely unless your health care provider tells you to do that.  Check your port insertion site every day for signs of  infection. Check for: ? Redness, swelling, or pain. ? Fluid or blood. ? Warmth. ? Pus or a bad smell. Activity  Return to your normal activities as told by your health care provider. Ask your health care provider what activities are safe for you.  Do not lift anything that is heavier than 10 lb (4.5 kg), or the limit that you are told, until your health care provider says that it is safe. General instructions  Take over-the-counter and prescription medicines only as told by your health care provider.  Do not take baths, swim, or use a hot tub until your health care provider approves. Ask your health care provider if you may take showers. You may only be allowed to take sponge baths.  Do not drive for 24 hours if you were given a sedative during your procedure.  Wear a medical alert bracelet in case of an emergency. This will tell any health care providers that you have a port.  Keep all follow-up visits as told by your health care provider. This is important. Contact a health care provider if:  You cannot flush your port with saline as directed, or you cannot draw blood from the port.  You have a fever or chills.  You have redness, swelling, or pain around your port insertion site.  You have fluid or blood coming from your port insertion site.  Your port insertion site feels warm to the touch.  You have pus or a bad   smell coming from the port insertion site. Get help right away if:  You have chest pain or shortness of breath.  You have bleeding from your port that you cannot control. Summary  Take care of the port as told by your health care provider. Keep the manufacturer's information card with you at all times.  Change your dressing as told by your health care provider.  Contact a health care provider if you have a fever or chills or if you have redness, swelling, or pain around your port insertion site.  Keep all follow-up visits as told by your health care  provider. This information is not intended to replace advice given to you by your health care provider. Make sure you discuss any questions you have with your health care provider. Document Revised: 08/20/2017 Document Reviewed: 08/20/2017 [employer] Patient Education  2020 [employer] Moderate Conscious Sedation, Adult, Care After These instructions provide you with information about caring for yourself after your procedure. Your health care provider may also give you more specific instructions. Your treatment has been planned according to current medical practices, but problems sometimes occur. Call your health care provider if you have any problems or questions after your procedure. What can I expect after the procedure? After your procedure, it is common:  To feel sleepy for several hours.  To feel clumsy and have poor balance for several hours.  To have poor judgment for several hours.  To vomit if you eat too soon. Follow these instructions at home: For at least 24 hours after the procedure:   Do not: ? Participate in activities where you could fall or become injured. ? Drive. ? Use heavy machinery. ? Drink alcohol. ? Take sleeping pills or medicines that cause drowsiness. ? Make important decisions or sign legal documents. ? Take care of children on your own.  Rest. Eating and drinking  Follow the diet recommended by your health care provider.  If you vomit: ? Drink water, juice, or soup when you can drink without vomiting. ? Make sure you have little or no nausea before eating solid foods. General instructions  Have a responsible adult stay with you until you are awake and alert.  Take over-the-counter and prescription medicines only as told by your health care provider.  If you smoke, do not smoke without supervision.  Keep all follow-up visits as told by your health care provider. This is important. Contact a health care provider if:  You keep feeling  nauseous or you keep vomiting.  You feel light-headed.  You develop a rash.  You have a fever. Get help right away if:  You have trouble breathing. This information is not intended to replace advice given to you by your health care provider. Make sure you discuss any questions you have with your health care provider. Document Revised: 01/04/2017 Document Reviewed: 05/14/2015 [employer] Patient Education  2020 [employer]  

## 2019-09-28 NOTE — Progress Notes (Signed)
Radiation Oncology         [phone] ________________________________  Initial Outpatient Consultation  Name: Vincent David MRN: [id number]  Date: 09/30/2019  DOB: [birth year]  GM:WNUUVOZDG, Lanelle Bal, DO  Ennever, Rudell Cobb, MD   REFERRING PHYSICIAN: Volanda Napoleon, MD  DIAGNOSIS: The primary encounter diagnosis was Secondary malignant neoplasm of bone and bone marrow ([place]). A diagnosis of Kidney cancer, primary, with metastasis from kidney to other site, left [facility]) was also pertinent to this visit.  Metastatic clear cell renal cell carcinoma  HISTORY OF PRESENT ILLNESS::Vincent David is a 54 y.o. male who is seen as a courtesy of Dr. Marin Olp for an opinion concerning radiation therapy as part of management for his recently diagnosed metastatic renal cell carcinoma. Today, he is accompanied by wife. The patient presented to Dr. Dianah Field, orthopedic, on 04/06/2019 with several month history of localized right-sided low back pain that radiated to the right groin. X-rays of the right hip and lumbar spine at that time showed degenerative changes of the lumbar spine and both hips without acute abnormalities. He was prescribed Prednisone followed by Meloxicam.  Given the persistence of right hip pain, the patient underwent an MRI of the right hip on 08/22/2019. Results showed several expansile destructive osseous lesions compatible with skeletal metastatic disease or myeloma. The largest of the lesions was int he right iliac bone and upper acetabulum with extensive cortical breakthrough and lytic destruction of the bone in addition to surrounding soft tissue edema that tracked in the adjacent right iliacus and gluteus minimus muscles. Additional metastatic lesions were seen in the right L5 vertebral body and transverse process, the left greater trochanter, and along the proximal left vastus lateralis muscle.  Chest x-ray on 08/25/2019 showed an expansile lytic lesion of the right seventh rib  posteromedially. There was also questionable scalloping of the inferior margin of the right sixth rib. Appearance was suspicious for osseous metastatic lesion given the clinical context.  Given the above above findings, the patient was referred to Dr. Marin Olp and was seen in consultation on 08/28/2019. At that time, it was recommended that the patient proceed with PET scan and biopsy.  PET scan on 09/02/2019 showed constellation of findings, including hypermetabolic osseous lesions and an infiltrative dominant left renal mas, most consistent with metastatic renal cell carcinoma. There was no evidence of hypermetabolic soft tissue metastasis.   CT-guided biopsy of the right iliac soft tissue mass was performed on 09/04/2019 by Dr. Earleen Newport. Pathology from the procedure revealed metastatic clear cell renal cell carcinoma.  MRI of brain on 09/07/2019 showed minimal chronic microvascular ischemic changes without evidence of intracranial metastases.  The patient was seen in follow-up with Dr. Marin Olp on 09/10/2019. Combination immunotherapy with Nivoumab and Ipilimumab with Delton See was scheduled to begin on 09/24/2019. He also recommended adjuvant radiation therapy to the right iliac mass, L5 vertebral body, and left femoral neck in addition to left hip repair.  The patient underwent an open reduction internal fixation of left hip intertrochanteric fracture on 09/14/2019 under the care of Dr. Mayer Camel. The left femur was prophylactically pinned without complication.  PREVIOUS RADIATION THERAPY: No  PAST MEDICAL HISTORY:  Past Medical History:  Diagnosis Date  . Cancer ([place])   . Cancer of kidney ([place])   . Goals of care, counseling/discussion 08/28/2019  . Postictal headache   . Seizure ([place])   . Sleep apnea   . Tinnitus     PAST SURGICAL HISTORY: Past Surgical History:  Procedure Laterality Date  . IR  IMAGING GUIDED PORT INSERTION  09/28/2019  . KNEE SURGERY Left   . ORIF PELVIC FRACTURE Left  09/14/2019   Procedure: OPEN REDUCTION INTERNAL FIXATION (ORIF) LEFT HIP WITH AFFIXUS NAIL;  Surgeon: Frederik Pear, MD;  Location: [facility];  Service: Orthopedics;  Laterality: Left;  . SHOULDER SURGERY Left     FAMILY HISTORY:  Family History  Problem Relation Age of Onset  . Sleep apnea Mother   . Diabetes Mother   . High blood pressure Father   . Cancer Maternal Uncle        type unknown "either pancreatic or kidney"    SOCIAL HISTORY:  Social History   Tobacco Use  . Smoking status: Current Every Day Smoker    Packs/day: 0.50    Types: Cigarettes  . Smokeless tobacco: Never Used  . Tobacco comment: using nicotine patch  Vaping Use  . Vaping Use: Never used  Substance Use Topics  . Alcohol use: Yes    Alcohol/week: 4.0 standard drinks    Types: 4 Standard drinks or equivalent per week  . Drug use: Never    ALLERGIES: No Known Allergies  MEDICATIONS:  Current Outpatient Medications  Medication Sig Dispense Refill  . aspirin EC 81 MG tablet Take 1 tablet (81 mg total) by mouth 2 (two) times daily. 60 tablet 0  . lamoTRIgine (LAMICTAL) 100 MG tablet Take 1 tablet (100 mg total) by mouth 2 (two) times daily. 180 tablet 1  . nicotine (NICODERM CQ - DOSED IN MG/24 HR) 7 mg/24hr patch Place 1 patch (7 mg total) onto the skin daily. 28 patch 0  . oxyCODONE 10 MG TABS Take 1 tablet (10 mg total) by mouth every 4 (four) hours as needed for severe pain. (Patient taking differently: Take 10 mg by mouth every 4 (four) hours as needed (Pain). ) 90 tablet 0  . tiZANidine (ZANAFLEX) 2 MG tablet Take 1 tablet (2 mg total) by mouth every 6 (six) hours as needed. 60 tablet 0  . gabapentin (NEURONTIN) 300 MG capsule Take 1 capsule (300 mg total) by mouth 3 (three) times daily. (Patient not taking: Reported on 09/30/2019) 90 capsule 4   No current facility-administered medications for this encounter.    REVIEW OF SYSTEMS:  A 10+ POINT REVIEW OF SYSTEMS WAS OBTAINED including neurology,  dermatology, psychiatry, cardiac, respiratory, lymph, extremities, GI, GU, musculoskeletal, constitutional, reproductive, HEENT.  Patient complains of pain along the right pelvis and left upper leg from his recent surgery.  He also complains of pain in the lower lumbar spine area.  He denies any pain along right lower rib cage area   PHYSICAL EXAM:  height is 5' 7"  (1.702 m) and weight is 222 lb 6.4 oz (100.9 kg). His temperature is 98.2 F (36.8 C). His blood pressure is 162/73 (abnormal) and his pulse is 98. His respiration is 18 and oxygen saturation is 99%.   General: Alert and oriented, in no acute distress HEENT: Head is normocephalic. Extraocular movements are intact.  Neck: Neck is supple, no palpable cervical or supraclavicular lymphadenopathy. Heart: Regular in rate and rhythm with no murmurs, rubs, or gallops. Chest: Clear to auscultation bilaterally, with no rhonchi, wheezes, or rales. Abdomen: Soft, nontender, nondistended, with no rigidity or guarding. Extremities: No cyanosis or edema.  Surgical scars healing well along the left upper back region.  Patient has significant difficulty with ambulation secondary to his pain in the right pelvis and recent surgery along the left.  He does ambulate with the  assistance of crutches however Lymphatics: see Neck Exam Skin: No concerning lesions. Musculoskeletal: symmetric strength and muscle tone throughout. Neurologic: Cranial nerves II through XII are grossly intact. No obvious focalities. Speech is fluent. Coordination is intact. Psychiatric: Judgment and insight are intact. Affect is appropriate.   ECOG = 1  0 - Asymptomatic (Fully active, able to carry on all predisease activities without restriction)  1 - Symptomatic but completely ambulatory (Restricted in physically strenuous activity but ambulatory and able to carry out work of a light or sedentary nature. For example, light housework, office work)  2 - Symptomatic, <50% in bed  during the day (Ambulatory and capable of all self care but unable to carry out any work activities. Up and about more than 50% of waking hours)  3 - Symptomatic, >50% in bed, but not bedbound (Capable of only limited self-care, confined to bed or chair 50% or more of waking hours)  4 - Bedbound (Completely disabled. Cannot carry on any self-care. Totally confined to bed or chair)  5 - Death   Eustace Pen MM, Creech RH, Tormey DC, et al. [phone]). "Toxicity and response criteria of the Orthopaedic Ambulatory Surgical Intervention Services Group". [place] Oncol. 5 (6): 649-55  LABORATORY DATA:  Lab Results  Component Value Date   WBC 10.4 09/29/2019   HGB 11.8 (L) 09/29/2019   HCT 35.9 (L) 09/29/2019   MCV 90.7 09/29/2019   PLT 337 09/29/2019   NEUTROABS 8.8 (H) 09/29/2019   Lab Results  Component Value Date   NA 133 (L) 09/29/2019   K 4.4 09/29/2019   CL 99 09/29/2019   CO2 25 09/29/2019   GLUCOSE 137 (H) 09/29/2019   CREATININE 0.91 09/29/2019   CALCIUM 8.8 (L) 09/29/2019      RADIOGRAPHY: MR Brain W Wo Contrast  Result Date: 09/07/2019 CLINICAL DATA:  Urologic cancer, staging. EXAM: MRI HEAD WITHOUT AND WITH CONTRAST TECHNIQUE: Multiplanar, multiecho pulse sequences of the brain and surrounding structures were obtained without and with intravenous contrast. CONTRAST:  17m GADAVIST GADOBUTROL 1 MMOL/ML IV SOLN COMPARISON:  03/10/2019 MRI head. FINDINGS: Brain: Few scattered punctate T2/FLAIR hyperintense foci involving the periventricular and subcortical white matter are nonspecific however commonly associated with chronic microvascular ischemic changes. Cerebral volume is within normal limits. No diffusion-weighted signal abnormality. No intracranial hemorrhage. No midline shift, ventriculomegaly or extra-axial fluid collection. No mass lesion. No abnormal enhancement. Vascular: Normal flow voids. Skull and upper cervical spine: Normal marrow signal. No focal enhancing lesions. Sinuses/Orbits: Normal orbits.  Mild frontal and ethmoid mucosal thickening. No mastoid effusion. Other: None. IMPRESSION: No evidence of intracranial metastases. Minimal chronic microvascular ischemic changes. Electronically Signed   By: CPrimitivo GauzeM.D.   On: 09/07/2019 12:11   NM PET Image Initial (PI) Skull Base To Thigh  Result Date: 09/02/2019 CLINICAL DATA:  Initial treatment strategy for osseous metastasis versus multiple myeloma. EXAM: NUCLEAR MEDICINE PET SKULL BASE TO THIGH TECHNIQUE: 11.7 mCi F-18 FDG was injected intravenously. Full-ring PET imaging was performed from the skull base to thigh after the radiotracer. CT data was obtained and used for attenuation correction and anatomic localization. Fasting blood glucose: 101 mg/dl COMPARISON:  MRI of the right hip of 08/22/2019. FINDINGS: Mediastinal blood pool activity: SUV max 2.6 Liver activity: SUV max NA NECK: No areas of abnormal hypermetabolism. Incidental CT findings: Bilateral carotid atherosclerosis. No cervical adenopathy. CHEST: No pulmonary parenchymal or thoracic nodal hypermetabolism. Incidental CT findings: Aortic atherosclerosis. Mild centrilobular emphysema. ABDOMEN/PELVIS: No abdominopelvic nodal hypermetabolism. A dominant infiltrative,  heterogeneous upper and interpolar left renal mass is not well evaluated at PET secondary to physiologic tracer accumulation. Felt to measure on the order of 8.0 x 6.4 cm on 128/4. Causes mild upper pole left-sided caliectasis. Other bilateral renal lesions are low-density and likely cysts. Incidental CT findings: Abdominal aortic atherosclerosis. Normal caliber of the left renal vein. Mild hepatic steatosis. SKELETON: Extensive hypermetabolic osseous metastasis. Posterior right seventh rib osseous and soft tissue lesion measures 5.5 x 3.9 cm and a S.U.V. max of 6.6 on 86/4. Expansile right iliac mass measures 6.8 x 5.7 cm and a S.U.V. max of 8.5 on 177/4. A left femoral neck lytic lesion with cortical involvement measures  1.5 cm and a S.U.V. max of 8.2 on 203/4. Eccentric right L5 vertebral body lesion measures a S.U.V. max of 8.7 including on 163/4. Incidental CT findings: Degenerative changes of the left hip. IMPRESSION: 1. Constellation of findings, including hypermetabolic osseous lesions and an infiltrative dominant left renal mass which are most consistent with metastatic renal cell carcinoma. 2. No evidence of hypermetabolic soft tissue metastasis. 3. Aortic atherosclerosis (ICD10-I70.0) and emphysema (ICD10-J43.9). 4. Mild hepatic steatosis. Electronically Signed   By: Abigail Miyamoto M.D.   On: 09/02/2019 16:34   CT Biopsy  Result Date: 09/04/2019 INDICATION: 54 year old male with a history multiple bone lesions including a right iliac mass EXAM: CT BIOPSY MEDICATIONS: None. ANESTHESIA/SEDATION: Moderate (conscious) sedation was employed during this procedure. A total of Versed 1.5 mg and Fentanyl 75 mcg was administered intravenously. Moderate Sedation Time: 11 minutes. The patient's level of consciousness and vital signs were monitored continuously by radiology nursing throughout the procedure under my direct supervision. FLUOROSCOPY TIME:  CT COMPLICATIONS: None PROCEDURE: Informed written consent was obtained from the patient after a thorough discussion of the procedural risks, benefits and alternatives. All questions were addressed. Maximal Sterile Barrier Technique was utilized including caps, mask, sterile gowns, sterile gloves, sterile drape, hand hygiene and skin antiseptic. A timeout was performed prior to the initiation of the procedure. Patient positioned supine position on the CT gantry table. Scout CT was acquired for planning purposes. Once the patient is prepped and draped in the usual sterile fashion, 1% lidocaine was used for local anesthesia. Guide needle was advanced tangentially to the right iliac mass, targeting the FDG avid material on prior CT. Multiple 16 gauge core biopsy were acquired. Needle was  removed and a final image was stored. Patient tolerated the procedure well and remained hemodynamically stable throughout. No complications were encountered and no significant blood loss. IMPRESSION: Status post CT-guided biopsy of right iliac mass. Signed, Dulcy Fanny. Dellia Nims, RPVI [facility] Electronically Signed   By: Corrie Mckusick D.O.   On: 09/04/2019 12:38   DG C-Arm 1-60 Min-No Report  Result Date: 09/14/2019 Fluoroscopy was utilized by the requesting physician.  No radiographic interpretation.   DG FEMUR MIN 2 VIEWS LEFT  Result Date: 09/14/2019 CLINICAL DATA:  ORIF EXAM: LEFT FEMUR 2 VIEWS COMPARISON:  None. FINDINGS: Intraoperative spot images demonstrate placement of dynamic hip screw and intramedullary nail in the left femur. No visible hardware complicating feature. IMPRESSION: As above. Electronically Signed   By: Rolm Baptise M.D.   On: 09/14/2019 17:57   IR IMAGING GUIDED PORT INSERTION  Result Date: 09/28/2019 INDICATION: History of metastatic renal cell carcinoma. In need of durable intravenous access for chemotherapy administration. EXAM: IMPLANTED PORT A CATH PLACEMENT WITH ULTRASOUND AND FLUOROSCOPIC GUIDANCE COMPARISON:  PET-CT-09/02/2019 MEDICATIONS: Ancef 2 gm  IV; The antibiotic was administered within an appropriate time interval prior to skin puncture. ANESTHESIA/SEDATION: Moderate (conscious) sedation was employed during this procedure. A total of Benadryl 25 mg, Versed 3 mg and Fentanyl 100 mcg was administered intravenously. Moderate Sedation Time: 27 minutes. The patient's level of consciousness and vital signs were monitored continuously by radiology nursing throughout the procedure under my direct supervision. CONTRAST:  None FLUOROSCOPY TIME:  30 seconds (10 mGy) COMPLICATIONS: None immediate. PROCEDURE: The procedure, risks, benefits, and alternatives were explained to the patient. Questions regarding the  procedure were encouraged and answered. The patient understands and consents to the procedure. The right neck and chest were prepped with chlorhexidine in a sterile fashion, and a sterile drape was applied covering the operative field. Maximum barrier sterile technique with sterile gowns and gloves were used for the procedure. A timeout was performed prior to the initiation of the procedure. Local anesthesia was provided with 1% lidocaine with epinephrine. After creating a small venotomy incision, a micropuncture kit was utilized to access the internal jugular vein. Real-time ultrasound guidance was utilized for vascular access including the acquisition of a permanent ultrasound image documenting patency of the accessed vessel. The microwire was utilized to measure appropriate catheter length. A subcutaneous port pocket was then created along the upper chest wall utilizing a combination of sharp and blunt dissection. The pocket was irrigated with sterile saline. A single lumen "ISP" sized power injectable port was chosen for placement. The 8 Fr catheter was tunneled from the port pocket site to the venotomy incision. The port was placed in the pocket. The external catheter was trimmed to appropriate length. At the venotomy, an 8 Fr peel-away sheath was placed over a guidewire under fluoroscopic guidance. The catheter was then placed through the sheath and the sheath was removed. Final catheter positioning was confirmed and documented with a fluoroscopic spot radiograph. The port was accessed with a Huber needle, aspirated and flushed with heparinized saline. The venotomy site was closed with an interrupted 4-0 Vicryl suture. The port pocket incision was closed with interrupted 2-0 Vicryl suture. The skin was opposed with a running subcuticular 4-0 Vicryl suture. Dermabond and Steri-strips were applied to both incisions. Dressings were applied. The patient tolerated the procedure well without immediate post  procedural complication. FINDINGS: After catheter placement, the tip lies within the superior cavoatrial junction. The catheter aspirates and flushes normally and is ready for immediate use. IMPRESSION: Successful placement of a right internal jugular approach power injectable Port-A-Cath. The catheter is ready for immediate use. Electronically Signed   By: Sandi Mariscal M.D.   On: 09/28/2019 16:57      IMPRESSION: Metastatic clear cell renal cell carcinoma  Patient would be a good candidate for short course of palliative radiation therapy directed to his right iliac mass L5 lesion and in the postoperative fashion directed at his left femur.  Patient is not symptomatic from his osseous metastasis involving the right chest wall area.  Today, I talked to the patient and wife about the findings and work-up thus far.  We discussed the natural history of metastatic clear cell renal cell carcinoma and general treatment, highlighting the role of radiotherapy in the management.  We discussed the available radiation techniques, and focused on the details of logistics and delivery.  We reviewed the anticipated acute and late sequelae associated with radiation in this setting.  The patient was encouraged to ask questions that I answered to the best of my ability.  A patient consent  form was discussed and signed.  We retained a copy for our records.  The patient would like to proceed with radiation and will be scheduled for CT simulation.  PLAN: To proceed with CT simulation early next week with treatments to begin several days later.  Anticipate 10-14 treatments directed at the above mentioned areas.  Total time spent in this encounter was 60 minutes which included reviewing the patient's most recent x-rays, MRI of right hip, PET scan, CT-guided biopsy and pathology, surgery, consultations, follow-ups, physical examination, and documentation.   ------------------------------------------------  Blair Promise,  PhD, MD  This document serves as a record of services personally performed by Gery Pray, MD. It was created on his behalf by Clerance Lav, a trained medical scribe. The creation of this record is based on the scribe's personal observations and the provider's statements to them. This document has been checked and approved by the attending provider.

## 2019-09-28 NOTE — Consult Note (Addendum)
Chief Complaint: Metastatic renal cell carcinoma. Request is for portacath placement for chemotherapy access.   Referring Physician(s): Ennever,Peter R  Supervising Physician: Sandi Mariscal  Patient Status: [facility] - Out-pt  History of Present Illness: Vincent David is a 54 y.o. male History of multiple bone lesions including the right iliac mass . IR performed a biopsy of the right iliac mass on 7.30.21.Cytology shows metastatic clear cell renal cell carcinoma. Patient had an open reduction internal fixation of left hip on 8.9.21. Team is requesting portacath placement for chemotherapy access.   Past Medical History:  Diagnosis Date  . Cancer of kidney ([place])   . Goals of care, counseling/discussion 08/28/2019  . Postictal headache   . Seizure ([place])   . Sleep apnea   . Tinnitus     Past Surgical History:  Procedure Laterality Date  . KNEE SURGERY Left   . ORIF PELVIC FRACTURE Left 09/14/2019   Procedure: OPEN REDUCTION INTERNAL FIXATION (ORIF) LEFT HIP WITH AFFIXUS NAIL;  Surgeon: Frederik Pear, MD;  Location: [facility];  Service: Orthopedics;  Laterality: Left;  . SHOULDER SURGERY Left     Allergies: Patient has no known allergies.  Medications: Prior to Admission medications   Medication Sig Start Date End Date Taking? Authorizing Provider  aspirin EC 81 MG tablet Take 1 tablet (81 mg total) by mouth 2 (two) times daily. 09/14/19  Yes Joanell Rising K, PA-C  gabapentin (NEURONTIN) 300 MG capsule Take 1 capsule (300 mg total) by mouth 3 (three) times daily. 09/10/19  Yes Volanda Napoleon, MD  lamoTRIgine (LAMICTAL) 100 MG tablet Take 1 tablet (100 mg total) by mouth 2 (two) times daily. 09/14/19  Yes Dohmeier, Asencion Partridge, MD  nicotine (NICODERM CQ - DOSED IN MG/24 HR) 7 mg/24hr patch Place 1 patch (7 mg total) onto the skin daily. 08/28/19  Yes Silverio Decamp, MD  oxyCODONE 10 MG TABS Take 1 tablet (10 mg total) by mouth every 4 (four) hours as needed for severe pain. Patient  taking differently: Take 10 mg by mouth every 4 (four) hours as needed (Pain).  09/10/19  Yes Ennever, Rudell Cobb, MD  tiZANidine (ZANAFLEX) 2 MG tablet Take 1 tablet (2 mg total) by mouth every 6 (six) hours as needed. 09/14/19  Yes Leighton Parody, PA-C     Family History  Problem Relation Age of Onset  . Sleep apnea Mother   . Diabetes Mother   . High blood pressure Father     Social History   Socioeconomic History  . Marital status: Married    Spouse name: Otila Kluver  . Number of children: 3  . Years of education: Not on file  . Highest education level: Not on file  Occupational History    Employer: OTHER  Tobacco Use  . Smoking status: Current Every Day Smoker    Packs/day: 0.50    Types: Cigarettes  . Smokeless tobacco: Never Used  . Tobacco comment: nicotine  Vaping Use  . Vaping Use: Never used  Substance and Sexual Activity  . Alcohol use: Yes    Alcohol/week: 4.0 standard drinks    Types: 4 Standard drinks or equivalent per week  . Drug use: Never  . Sexual activity: Yes    Partners: Female  Other Topics Concern  . Not on file  Social History Narrative  . Not on file   Social Determinants of Health   Financial Resource Strain:   . Difficulty of Paying Living Expenses: Not on file  Food Insecurity:   .  Worried About Charity fundraiser in the Last Year: Not on file  . Ran Out of Food in the Last Year: Not on file  Transportation Needs:   . Lack of Transportation (Medical): Not on file  . Lack of Transportation (Non-Medical): Not on file  Physical Activity:   . Days of Exercise per Week: Not on file  . Minutes of Exercise per Session: Not on file  Stress:   . Feeling of Stress : Not on file  Social Connections:   . Frequency of Communication with Friends and Family: Not on file  . Frequency of Social Gatherings with Friends and Family: Not on file  . Attends Religious Services: Not on file  . Active Member of Clubs or Organizations: Not on file  . Attends English as a second language teacher Meetings: Not on file  . Marital Status: Not on file     Review of Systems: A 12 point ROS discussed and pertinent positives are indicated in the HPI above.  All other systems are negative.  Review of Systems  Constitutional: Negative for fever.  HENT: Negative for congestion.   Respiratory: Negative for cough and shortness of breath.   Cardiovascular: Negative for chest pain.  Gastrointestinal: Negative for abdominal pain.  Musculoskeletal: Positive for arthralgias (bilateral hips).  Neurological: Negative for headaches.  Psychiatric/Behavioral: Negative for behavioral problems and confusion.    Vital Signs: BP (!) 165/77 (BP Location: Right Arm)   Pulse (!) 102   Temp 99.1 F (37.3 C) (Oral)   Resp 18   Ht 5' 7"  (1.702 m)   Wt 222 lb (100.7 kg)   SpO2 98%   BMI 34.77 kg/m   Physical Exam Vitals and nursing note reviewed.  Constitutional:      Appearance: He is well-developed.  HENT:     Head: Normocephalic.  Cardiovascular:     Rate and Rhythm: Regular rhythm. Tachycardia present.     Heart sounds: Normal heart sounds.  Pulmonary:     Effort: Pulmonary effort is normal.     Breath sounds: Normal breath sounds.  Musculoskeletal:        General: Normal range of motion.     Cervical back: Normal range of motion.  Skin:    General: Skin is dry.  Neurological:     Mental Status: He is alert and oriented to person, place, and time.     Imaging: MR Brain W Wo Contrast  Result Date: 09/07/2019 CLINICAL DATA:  Urologic cancer, staging. EXAM: MRI HEAD WITHOUT AND WITH CONTRAST TECHNIQUE: Multiplanar, multiecho pulse sequences of the brain and surrounding structures were obtained without and with intravenous contrast. CONTRAST:  63m GADAVIST GADOBUTROL 1 MMOL/ML IV SOLN COMPARISON:  03/10/2019 MRI head. FINDINGS: Brain: Few scattered punctate T2/FLAIR hyperintense foci involving the periventricular and subcortical white matter are nonspecific however  commonly associated with chronic microvascular ischemic changes. Cerebral volume is within normal limits. No diffusion-weighted signal abnormality. No intracranial hemorrhage. No midline shift, ventriculomegaly or extra-axial fluid collection. No mass lesion. No abnormal enhancement. Vascular: Normal flow voids. Skull and upper cervical spine: Normal marrow signal. No focal enhancing lesions. Sinuses/Orbits: Normal orbits. Mild frontal and ethmoid mucosal thickening. No mastoid effusion. Other: None. IMPRESSION: No evidence of intracranial metastases. Minimal chronic microvascular ischemic changes. Electronically Signed   By: CPrimitivo GauzeM.D.   On: 09/07/2019 12:11   NM PET Image Initial (PI) Skull Base To Thigh  Result Date: 09/02/2019 CLINICAL DATA:  Initial treatment strategy for osseous metastasis versus multiple  myeloma. EXAM: NUCLEAR MEDICINE PET SKULL BASE TO THIGH TECHNIQUE: 11.7 mCi F-18 FDG was injected intravenously. Full-ring PET imaging was performed from the skull base to thigh after the radiotracer. CT data was obtained and used for attenuation correction and anatomic localization. Fasting blood glucose: 101 mg/dl COMPARISON:  MRI of the right hip of 08/22/2019. FINDINGS: Mediastinal blood pool activity: SUV max 2.6 Liver activity: SUV max NA NECK: No areas of abnormal hypermetabolism. Incidental CT findings: Bilateral carotid atherosclerosis. No cervical adenopathy. CHEST: No pulmonary parenchymal or thoracic nodal hypermetabolism. Incidental CT findings: Aortic atherosclerosis. Mild centrilobular emphysema. ABDOMEN/PELVIS: No abdominopelvic nodal hypermetabolism. A dominant infiltrative, heterogeneous upper and interpolar left renal mass is not well evaluated at PET secondary to physiologic tracer accumulation. Felt to measure on the order of 8.0 x 6.4 cm on 128/4. Causes mild upper pole left-sided caliectasis. Other bilateral renal lesions are low-density and likely cysts. Incidental CT  findings: Abdominal aortic atherosclerosis. Normal caliber of the left renal vein. Mild hepatic steatosis. SKELETON: Extensive hypermetabolic osseous metastasis. Posterior right seventh rib osseous and soft tissue lesion measures 5.5 x 3.9 cm and a S.U.V. max of 6.6 on 86/4. Expansile right iliac mass measures 6.8 x 5.7 cm and a S.U.V. max of 8.5 on 177/4. A left femoral neck lytic lesion with cortical involvement measures 1.5 cm and a S.U.V. max of 8.2 on 203/4. Eccentric right L5 vertebral body lesion measures a S.U.V. max of 8.7 including on 163/4. Incidental CT findings: Degenerative changes of the left hip. IMPRESSION: 1. Constellation of findings, including hypermetabolic osseous lesions and an infiltrative dominant left renal mass which are most consistent with metastatic renal cell carcinoma. 2. No evidence of hypermetabolic soft tissue metastasis. 3. Aortic atherosclerosis (ICD10-I70.0) and emphysema (ICD10-J43.9). 4. Mild hepatic steatosis. Electronically Signed   By: Abigail Miyamoto M.D.   On: 09/02/2019 16:34   CT Biopsy  Result Date: 09/04/2019 INDICATION: 54 year old male with a history multiple bone lesions including a right iliac mass EXAM: CT BIOPSY MEDICATIONS: None. ANESTHESIA/SEDATION: Moderate (conscious) sedation was employed during this procedure. A total of Versed 1.5 mg and Fentanyl 75 mcg was administered intravenously. Moderate Sedation Time: 11 minutes. The patient's level of consciousness and vital signs were monitored continuously by radiology nursing throughout the procedure under my direct supervision. FLUOROSCOPY TIME:  CT COMPLICATIONS: None PROCEDURE: Informed written consent was obtained from the patient after a thorough discussion of the procedural risks, benefits and alternatives. All questions were addressed. Maximal Sterile Barrier Technique was utilized including caps, mask, sterile gowns, sterile gloves, sterile drape, hand hygiene and skin antiseptic. A timeout was  performed prior to the initiation of the procedure. Patient positioned supine position on the CT gantry table. Scout CT was acquired for planning purposes. Once the patient is prepped and draped in the usual sterile fashion, 1% lidocaine was used for local anesthesia. Guide needle was advanced tangentially to the right iliac mass, targeting the FDG avid material on prior CT. Multiple 16 gauge core biopsy were acquired. Needle was removed and a final image was stored. Patient tolerated the procedure well and remained hemodynamically stable throughout. No complications were encountered and no significant blood loss. IMPRESSION: Status post CT-guided biopsy of right iliac mass. Signed, Dulcy Fanny. Dellia Nims, RPVI [facility] Electronically Signed   By: Corrie Mckusick D.O.   On: 09/04/2019 12:38   DG C-Arm 1-60 Min-No Report  Result Date: 09/14/2019 Fluoroscopy was utilized by the requesting physician.  No radiographic  interpretation.   DG FEMUR MIN 2 VIEWS LEFT  Result Date: 09/14/2019 CLINICAL DATA:  ORIF EXAM: LEFT FEMUR 2 VIEWS COMPARISON:  None. FINDINGS: Intraoperative spot images demonstrate placement of dynamic hip screw and intramedullary nail in the left femur. No visible hardware complicating feature. IMPRESSION: As above. Electronically Signed   By: Rolm Baptise M.D.   On: 09/14/2019 17:57    Labs:  CBC: Recent Labs    08/28/19 0824 09/04/19 0932 09/14/19 1225 09/15/19 0322  WBC 8.5 9.2 10.0 11.7*  HGB 15.6 15.4 14.9 13.6  HCT 46.8 47.4 45.0 41.4  PLT 246 275 207 193    COAGS: Recent Labs    06/29/19 1001 08/25/19 1450 09/04/19 0932  INR 1.0 1.0 1.0  APTT  --  31  --     BMP: Recent Labs    08/25/19 1450 08/28/19 0824 09/14/19 1225 09/15/19 0322  NA 140 136 136 135  K 4.6 4.3 4.1 4.8  CL 103 99 101 98  CO2 25 28 24 26   GLUCOSE 97 110* 109* 180*  BUN 15 18 17 16   CALCIUM 9.8 11.1* 10.4* 9.6  CREATININE 1.15  1.06 1.00 1.16  GFRNONAA 71 >60 >60 >60  GFRAA 83 >60 >60 >60    LIVER FUNCTION TESTS: Recent Labs    02/04/19 1627 06/29/19 1001 08/25/19 1450 08/28/19 0824  BILITOT 0.5 0.6 0.3 0.4  AST 18 17 13  13*  ALT 22 23 18 17   ALKPHOS  --  117  --  142*  PROT 6.8 7.0 6.7  6.5 7.5  ALBUMIN  --  3.8  --  4.8     Assessment and Plan:  54 y.o, male outpatient. History of multiple bone lesions including the right iliac mass . IR performed a biopsy of the right iliac mass on 7.30.21.Cytology shows metastatic clear cell renal cell carcinoma. Patient had an open reduction internal fixation of left hip on 8.9.21. Team is requesting portacath placement for chemotherapy access.  Patient has no known allergies. All labs and medications are within acceptable parameters. Patient is afebrile.  Risks and benefits of image guided port-a-catheter placement was discussed with the patient including, but not limited to bleeding, infection, pneumothorax, or fibrin sheath development and need for additional procedures.  All of the patient's questions were answered, patient is agreeable to proceed.  Consent signed and in chart.   Thank you for this interesting consult.  I greatly enjoyed meeting [employer] and look forward to participating in their care.  A copy of this report was sent to the requesting provider on this date.  Electronically Signed: Jacqualine Mau, NP 09/28/2019, 2:02 PM   I spent a total of  40 Minutes   in face to face in clinical consultation, greater than 50% of which was counseling/coordinating care for portacath placement

## 2019-09-29 ENCOUNTER — Encounter: Payer: Self-pay | Admitting: *Deleted

## 2019-09-29 ENCOUNTER — Inpatient Hospital Stay: Payer: BC Managed Care – PPO

## 2019-09-29 ENCOUNTER — Inpatient Hospital Stay (HOSPITAL_BASED_OUTPATIENT_CLINIC_OR_DEPARTMENT_OTHER): Payer: BC Managed Care – PPO | Admitting: Hematology & Oncology

## 2019-09-29 ENCOUNTER — Inpatient Hospital Stay: Payer: BC Managed Care – PPO | Admitting: Nutrition

## 2019-09-29 ENCOUNTER — Other Ambulatory Visit: Payer: Self-pay | Admitting: Hematology & Oncology

## 2019-09-29 VITALS — BP 158/72 | HR 103 | Temp 98.8°F | Resp 18 | Wt 223.0 lb

## 2019-09-29 DIAGNOSIS — R197 Diarrhea, unspecified: Secondary | ICD-10-CM | POA: Diagnosis not present

## 2019-09-29 DIAGNOSIS — M7989 Other specified soft tissue disorders: Secondary | ICD-10-CM | POA: Diagnosis not present

## 2019-09-29 DIAGNOSIS — C642 Malignant neoplasm of left kidney, except renal pelvis: Secondary | ICD-10-CM

## 2019-09-29 DIAGNOSIS — G893 Neoplasm related pain (acute) (chronic): Secondary | ICD-10-CM | POA: Diagnosis not present

## 2019-09-29 DIAGNOSIS — Z79899 Other long term (current) drug therapy: Secondary | ICD-10-CM | POA: Diagnosis not present

## 2019-09-29 DIAGNOSIS — C7951 Secondary malignant neoplasm of bone: Secondary | ICD-10-CM | POA: Diagnosis not present

## 2019-09-29 DIAGNOSIS — Z7982 Long term (current) use of aspirin: Secondary | ICD-10-CM | POA: Diagnosis not present

## 2019-09-29 DIAGNOSIS — R2241 Localized swelling, mass and lump, right lower limb: Secondary | ICD-10-CM

## 2019-09-29 LAB — CBC WITH DIFFERENTIAL (CANCER CENTER ONLY)
Abs Immature Granulocytes: 0.06 10*3/uL (ref 0.00–0.07)
Basophils Absolute: 0 10*3/uL (ref 0.0–0.1)
Basophils Relative: 0 %
Eosinophils Absolute: 0.1 10*3/uL (ref 0.0–0.5)
Eosinophils Relative: 1 %
HCT: 35.9 % — ABNORMAL LOW (ref 39.0–52.0)
Hemoglobin: 11.8 g/dL — ABNORMAL LOW (ref 13.0–17.0)
Immature Granulocytes: 1 %
Lymphocytes Relative: 9 %
Lymphs Abs: 0.9 10*3/uL (ref 0.7–4.0)
MCH: 29.8 pg (ref 26.0–34.0)
MCHC: 32.9 g/dL (ref 30.0–36.0)
MCV: 90.7 fL (ref 80.0–100.0)
Monocytes Absolute: 0.4 10*3/uL (ref 0.1–1.0)
Monocytes Relative: 4 %
Neutro Abs: 8.8 10*3/uL — ABNORMAL HIGH (ref 1.7–7.7)
Neutrophils Relative %: 85 %
Platelet Count: 337 10*3/uL (ref 150–400)
RBC: 3.96 MIL/uL — ABNORMAL LOW (ref 4.22–5.81)
RDW: 13.5 % (ref 11.5–15.5)
WBC Count: 10.4 10*3/uL (ref 4.0–10.5)
nRBC: 0 % (ref 0.0–0.2)

## 2019-09-29 LAB — COMPREHENSIVE METABOLIC PANEL
ALT: 14 U/L (ref 0–44)
AST: 14 U/L — ABNORMAL LOW (ref 15–41)
Albumin: 4.1 g/dL (ref 3.5–5.0)
Alkaline Phosphatase: 159 U/L — ABNORMAL HIGH (ref 38–126)
Anion gap: 9 (ref 5–15)
BUN: 10 mg/dL (ref 6–20)
CO2: 25 mmol/L (ref 22–32)
Calcium: 8.8 mg/dL — ABNORMAL LOW (ref 8.9–10.3)
Chloride: 99 mmol/L (ref 98–111)
Creatinine, Ser: 0.91 mg/dL (ref 0.61–1.24)
GFR calc Af Amer: >60 mL/min (ref >60)
GFR calc non Af Amer: >60 mL/min (ref >60)
Glucose, Bld: 137 mg/dL — ABNORMAL HIGH (ref 70–99)
Potassium: 4.4 mmol/L (ref 3.5–5.1)
Sodium: 133 mmol/L — ABNORMAL LOW (ref 135–145)
Total Bilirubin: 0.5 mg/dL (ref 0.3–1.2)
Total Protein: 6.7 g/dL (ref 6.5–8.1)

## 2019-09-29 LAB — TSH: TSH: 2.295 u[IU]/mL (ref 0.320–4.118)

## 2019-09-29 MED ORDER — DIPHENHYDRAMINE HCL 50 MG/ML IJ SOLN
25.0000 mg | Freq: Once | INTRAMUSCULAR | Status: AC
  Administered 2019-09-29: 25 mg via INTRAVENOUS

## 2019-09-29 MED ORDER — HEPARIN SOD (PORK) LOCK FLUSH 100 UNIT/ML IV SOLN
500.0000 [IU] | Freq: Once | INTRAVENOUS | Status: AC | PRN
  Administered 2019-09-29: 500 [IU]
  Filled 2019-09-29: qty 5

## 2019-09-29 MED ORDER — DIPHENHYDRAMINE HCL 50 MG/ML IJ SOLN
INTRAMUSCULAR | Status: AC
  Filled 2019-09-29: qty 1

## 2019-09-29 MED ORDER — FAMOTIDINE IN NACL 20-0.9 MG/50ML-% IV SOLN
INTRAVENOUS | Status: AC
  Filled 2019-09-29: qty 50

## 2019-09-29 MED ORDER — SODIUM CHLORIDE 0.9 % IV SOLN
2.9800 mg/kg | Freq: Once | INTRAVENOUS | Status: AC
  Administered 2019-09-29: 300 mg via INTRAVENOUS
  Filled 2019-09-29: qty 30

## 2019-09-29 MED ORDER — SODIUM CHLORIDE 0.9 % IV SOLN
1.0000 mg/kg | Freq: Once | INTRAVENOUS | Status: AC
  Administered 2019-09-29: 100 mg via INTRAVENOUS
  Filled 2019-09-29: qty 20

## 2019-09-29 MED ORDER — SODIUM CHLORIDE 0.9 % IV SOLN
Freq: Once | INTRAVENOUS | Status: AC
  Filled 2019-09-29: qty 250

## 2019-09-29 MED ORDER — SODIUM CHLORIDE 0.9% FLUSH
10.0000 mL | INTRAVENOUS | Status: DC | PRN
  Administered 2019-09-29: 10 mL
  Filled 2019-09-29: qty 10

## 2019-09-29 MED ORDER — FAMOTIDINE IN NACL 20-0.9 MG/50ML-% IV SOLN
20.0000 mg | Freq: Once | INTRAVENOUS | Status: AC
  Administered 2019-09-29: 20 mg via INTRAVENOUS

## 2019-09-29 NOTE — Patient Instructions (Signed)
Implanted Port Insertion, Care After °This sheet gives you information about how to care for yourself after your procedure. Your health care provider may also give you more specific instructions. If you have problems or questions, contact your health care provider. °What can I expect after the procedure? °After the procedure, it is common to have: °· Discomfort at the port insertion site. °· Bruising on the skin over the port. This should improve over 3-4 days. °Follow these instructions at home: °Port care °· After your port is placed, you will get a manufacturer's information card. The card has information about your port. Keep this card with you at all times. °· Take care of the port as told by your health care provider. Ask your health care provider if you or a family member can get training for taking care of the port at home. A home health care nurse may also take care of the port. °· Make sure to remember what type of port you have. °Incision care ° °  ° °· Follow instructions from your health care provider about how to take care of your port insertion site. Make sure you: °? Wash your hands with soap and water before and after you change your bandage (dressing). If soap and water are not available, use hand sanitizer. °? Change your dressing as told by your health care provider. °? Leave stitches (sutures), skin glue, or adhesive strips in place. These skin closures may need to stay in place for 2 weeks or longer. If adhesive strip edges start to loosen and curl up, you may trim the loose edges. Do not remove adhesive strips completely unless your health care provider tells you to do that. °· Check your port insertion site every day for signs of infection. Check for: °? Redness, swelling, or pain. °? Fluid or blood. °? Warmth. °? Pus or a bad smell. °Activity °· Return to your normal activities as told by your health care provider. Ask your health care provider what activities are safe for you. °· Do not  lift anything that is heavier than 10 lb (4.5 kg), or the limit that you are told, until your health care provider says that it is safe. °General instructions °· Take over-the-counter and prescription medicines only as told by your health care provider. °· Do not take baths, swim, or use a hot tub until your health care provider approves. Ask your health care provider if you may take showers. You may only be allowed to take sponge baths. °· Do not drive for 24 hours if you were given a sedative during your procedure. °· Wear a medical alert bracelet in case of an emergency. This will tell any health care providers that you have a port. °· Keep all follow-up visits as told by your health care provider. This is important. °Contact a health care provider if: °· You cannot flush your port with saline as directed, or you cannot draw blood from the port. °· You have a fever or chills. °· You have redness, swelling, or pain around your port insertion site. °· You have fluid or blood coming from your port insertion site. °· Your port insertion site feels warm to the touch. °· You have pus or a bad smell coming from the port insertion site. °Get help right away if: °· You have chest pain or shortness of breath. °· You have bleeding from your port that you cannot control. °Summary °· Take care of the port as told by your health   care provider. Keep the manufacturer's information card with you at all times. °· Change your dressing as told by your health care provider. °· Contact a health care provider if you have a fever or chills or if you have redness, swelling, or pain around your port insertion site. °· Keep all follow-up visits as told by your health care provider. °This information is not intended to replace advice given to you by your health care provider. Make sure you discuss any questions you have with your health care provider. °Document Revised: 08/20/2017 Document Reviewed: 08/20/2017 °Elsevier Patient Education ©  2020 [employer] ° °

## 2019-09-29 NOTE — Progress Notes (Signed)
54 year old male diagnosed with metastatic Renal Cell Carcinoma. He is followed by Dr. Marin Olp and Dr. Sondra Come.  PMH includes Seizures  Medications include Oxycodone.  Labs include Glucose 137 and Na 133 on Aug 24.  Height: 5'7". Weight: 223 pounds. Patient weighed 238 pounds in March 2021. BMI: 34.93.  Patient is here for first chemotherapy. He denies nutrition impact symptoms.  Nutrition Diagnosis: Food and Nutrition Related Knowledge Deficit related to cancer and associated treatments as evidenced by no prior need for nutrition related education.  Intervention: Educated to consume small, frequent meals and snacks consisting of adequate calories and protein for weight maintenance and healing. Reviewed protein sources. Encouraged bowel regimen including plenty of fluids. Activity if/as tolerated per MD. Provided fact sheets and contact information.  Monitoring, Evaluation, Goals: Weight trends  Knowledge Deficit resolved. Patient will call me with questions or concerns.

## 2019-09-29 NOTE — Progress Notes (Signed)
Patient here to start treatment. He had his port placed yesterday and c/o mild pain at site. He will get chemo education, and nutrition consult both today before and during his treatment. Will follow up with him tomorrow to see how he feels.  Oncology Nurse Navigator Documentation  Oncology Nurse Navigator Flowsheets 09/29/2019  Abnormal Finding Date -  Confirmed Diagnosis Date -  Diagnosis Status -  Planned Course of Treatment Chemotherapy  Phase of Treatment Chemo  Chemotherapy Pending- Reason: -  Chemotherapy Actual Start Date: 09/29/2019  Navigator Follow Up Date: 10/20/2019  Navigator Follow Up Reason: Follow-up Appointment;Chemotherapy  Navigator Location [facility]  Referral Date to RadOnc/MedOnc -  Navigator Encounter Type Treatment  Telephone -  Treatment Initiated Date 09/29/2019  Patient Visit Type MedOnc  Treatment Phase First Chemo Tx  Barriers/Navigation Needs Coordination of Care;Education;Family Concerns  Education -  Interventions Psycho-Social Support  Acuity Level 2-Minimal Needs (1-2 Barriers Identified)  Referrals -  Coordination of Care -  Education Method -  Support Groups/Services Friends and Family  Time Spent with Patient 30

## 2019-09-29 NOTE — Progress Notes (Signed)
Histology and Location of Primary Cancer:  Metastatic clear cell renal cell carcinoma  Biopsies revealed:  09/04/2019 FINAL MICROSCOPIC DIAGNOSIS:  A. BONE, RIGHT ILIAC MASS, BIOPSY:  - Metastatic clear cell renal cell carcinoma.   Sites of Visceral and Bony Metastatic Disease:  MRI of the right hip on 08/22/2019 showed:  right iliac bone and upper acetabulum  right L5 vertebral body and transverse process  left greater trochanter  proximal left vastus lateralis muscle Chest x-ray on 08/25/2019 showed   expansile lytic lesion of the right seventh rib posteromedially  also questionable scalloping of the inferior margin of the right sixth rib  Location(s) of Symptomatic Metastases: bilateral hips and L5  Past/Anticipated chemotherapy by medical oncology, if any:  Under care of Dr. Burney Gauze -Current Therapy:         Nivoumab/Ipilimumab -- cycle #1 to start on 09/24/2019  Xgeva 120 mg sq q 3 months (received IV Zometa when inpatient after orthopedic surgery)  XRT to right iliac mass/ L5 vertebral body/ Left femoral neck  LEFT hip repair (done 09/14/2019) -He can start radiation over to the right hip area soon.  The radiation to the left hip by we will have to wait 2 weeks so that healing can occur after surgery. -There was some discussion about maybe trying to embolize the tumor.  I am not sure if interventional radiology can do that.  I will have to talk to them.  Pain on a scale of 0-10 is: Reports constant pain in bilateral hips and at L5. Reports pain in right hip worse than left. Reports radiating pain from L5 down his legs resolved with neurontin but he had to stop taking it four days ago due to edema and "twitches." Endorses taking Oxycodone 10 mg every four hours and zanaflex to manage pain.   If Spine Met(s), symptoms, if any, include:  Bowel/Bladder retention or incontinence (please describe): denies  Numbness or weakness in extremities (please describe):  denies  Current Decadron regimen, if applicable: Not currently prescribed  Ambulatory status? Walker? Wheelchair?:  09/14/2019 Dr. Frederik Pear Open reduction internal fixation to LEFT hip intertrochanteric fracture  (weight bearing as tolerated)  SAFETY ISSUES:  Prior radiation? denies  Pacemaker/ICD? denies  Possible current pregnancy? N/A  Is the patient on methotrexate? No  Current Complaints / other details:  54 year old male. Married with three kids. Reports constipation s/p surgery has resolved. Reports occasional headaches correlating with zanaflex use. States his kidney cancer is consistent with 911. Reports difficulty sleeping because he is unable to achieve a comfortable position due to pain. Also, patient suffers from sleep apnea. Patient questions if his left kidney will be radiated and when his opposite hip will be radiated. Questions relayed to Dr. Sondra Come.

## 2019-09-29 NOTE — Addendum Note (Signed)
Addended by: Burney Gauze R on: 09/29/2019 11:53 AM   Modules accepted: Orders

## 2019-09-29 NOTE — Progress Notes (Signed)
Hematology and Oncology Follow Up Visit  Tamari Busic [id number] [birth year] 54 y.o. 09/29/2019   Principle Diagnosis:   Metastatic Renal Cell Carcinoma - bone mets  Current Therapy:    Nivoumab/Ipilimumab -- cycle #1 to start on 09/29/2019  Xgeva 120 mg sq q 3 months  XRT to right iliac mass/ L5 vertebral body/ Left femoral neck  LEFT hip repair     Interim History:  Mr. Hathorne is back for follow-up.  I am amazed as to how much he is going through already.  I really appreciate Dr. Mayer Camel of [place] for be able to operate on him so quickly.  He went ahead and operated on the left hip and did a pinning so that there would not be a pathologic fracture.  Mr. Gaertner will go to radiation oncology tomorrow for evaluation.  He will start his immunotherapy today.  I do not see any issue with respect to immunotherapy and radiation therapy.  He is getting around with crutches.  He has only hospitalized for I think a couple days.  He has had no problems with bowels or bladder.  He is not taking the Neurontin as much.  He had little bit of swelling in the legs.  His pain medication regimen seems to be helping him right now.  He has had no cough or shortness of breath.  He has had no headache.  He has had no rashes.  Currently, I would say his performance status is ECOG 1.  Medications:  Current Outpatient Medications:  .  aspirin EC 81 MG tablet, Take 1 tablet (81 mg total) by mouth 2 (two) times daily., Disp: 60 tablet, Rfl: 0 .  gabapentin (NEURONTIN) 300 MG capsule, Take 1 capsule (300 mg total) by mouth 3 (three) times daily., Disp: 90 capsule, Rfl: 4 .  lamoTRIgine (LAMICTAL) 100 MG tablet, Take 1 tablet (100 mg total) by mouth 2 (two) times daily., Disp: 180 tablet, Rfl: 1 .  nicotine (NICODERM CQ - DOSED IN MG/24 HR) 7 mg/24hr patch, Place 1 patch (7 mg total) onto the skin daily., Disp: 28 patch, Rfl: 0 .  oxyCODONE 10 MG TABS, Take 1 tablet (10 mg total) by mouth  every 4 (four) hours as needed for severe pain. (Patient taking differently: Take 10 mg by mouth every 4 (four) hours as needed (Pain). ), Disp: 90 tablet, Rfl: 0 .  tiZANidine (ZANAFLEX) 2 MG tablet, Take 1 tablet (2 mg total) by mouth every 6 (six) hours as needed., Disp: 60 tablet, Rfl: 0  Allergies: No Known Allergies  Past Medical History, Surgical history, Social history, and Family History were reviewed and updated.  Review of Systems: Review of Systems  Constitutional: Negative.   HENT:  Negative.   Eyes: Negative.   Respiratory: Negative.   Cardiovascular: Negative.   Gastrointestinal: Negative.   Endocrine: Negative.   Musculoskeletal: Positive for back pain and flank pain.  Skin: Negative.   Neurological: Negative.   Hematological: Negative.   Psychiatric/Behavioral: Negative.     Physical Exam:  weight is 223 lb (101.2 kg). His oral temperature is 98.8 F (37.1 C). His blood pressure is 158/72 (abnormal) and his pulse is 103 (abnormal). His respiration is 18 and oxygen saturation is 100%.   Wt Readings from Last 3 Encounters:  09/29/19 223 lb (101.2 kg)  09/28/19 222 lb (100.7 kg)  09/14/19 224 lb 13.9 oz (102 kg)    Physical Exam Vitals reviewed.  HENT:     Head: Normocephalic and  atraumatic.  Eyes:     Pupils: Pupils are equal, round, and reactive to light.  Cardiovascular:     Rate and Rhythm: Normal rate and regular rhythm.     Heart sounds: Normal heart sounds.  Pulmonary:     Effort: Pulmonary effort is normal.     Breath sounds: Normal breath sounds.  Abdominal:     General: Bowel sounds are normal.     Palpations: Abdomen is soft.  Musculoskeletal:        General: No tenderness or deformity. Normal range of motion.     Cervical back: Normal range of motion.  Lymphadenopathy:     Cervical: No cervical adenopathy.  Skin:    General: Skin is warm and dry.     Findings: No erythema or rash.  Neurological:     Mental Status: He is alert and  oriented to person, place, and time.  Psychiatric:        Behavior: Behavior normal.        Thought Content: Thought content normal.        Judgment: Judgment normal.      Lab Results  Component Value Date   WBC 10.4 09/29/2019   HGB 11.8 (L) 09/29/2019   HCT 35.9 (L) 09/29/2019   MCV 90.7 09/29/2019   PLT 337 09/29/2019     Chemistry      Component Value Date/Time   NA 133 (L) 09/29/2019 0950   K 4.4 09/29/2019 0950   CL 99 09/29/2019 0950   CO2 25 09/29/2019 0950   BUN 10 09/29/2019 0950   CREATININE 0.91 09/29/2019 0950   CREATININE 1.06 08/28/2019 0824   CREATININE 1.15 08/25/2019 1450      Component Value Date/Time   CALCIUM 8.8 (L) 09/29/2019 0950   ALKPHOS 159 (H) 09/29/2019 0950   AST 14 (L) 09/29/2019 0950   AST 13 (L) 08/28/2019 0824   ALT 14 09/29/2019 0950   ALT 17 08/28/2019 0824   BILITOT 0.5 09/29/2019 0950   BILITOT 0.4 08/28/2019 0824      Impression and Plan: Mr. Camille is a very nice 54 year old white male.  He has metastatic renal cell carcinoma.  For some reason, his disease he clearly favors his bones.  His lungs and liver and lymph nodes all appear to be clean.  He is recovering from the left hip repair.  He now will start radiation therapy.  He will get radiation therapy to the right iliac mass and I think L5.  Of note, he did get Zometa in the hospital.  As such, we do not have to do Xgeva probably until November.  I do not see a problem with him doing the immunotherapy.  I know this is combination immunotherapy.  We will just have to be cautious with his thyroid and with diarrhea.  I will plan to get him back in 3 weeks for his second cycle of treatment.   Volanda Napoleon, MD 8/24/202111:27 AM

## 2019-09-29 NOTE — Progress Notes (Signed)
Patient in chemotherapy education class with  wife.  Discussed side effects of Opdivo and Yervoy  which include but are not limited to myelosuppression, decreased appetite, fatigue, fever, allergic or infusional reaction, cough, SOB, altered taste, nausea and vomiting, diarrhea, elevated LFTs myalgia and arthralgias, rash, skin dryness, nail changes, peripheral neuropathy, delayed wound healing, mental changes (Chemo brain), increased risk of infections, weight loss.  Reviewed infusion room and office policy and procedure and phone numbers 24 hours x 7 days a week.    Reviewed when to call the office with any concerns or problems.  Scientist, clinical (histocompatibility and immunogenetics) given.  Discussed portacath insertion and EMLA cream administration.  Antiemetic protocol and chemotherapy schedule reviewed. Patient verbalized understanding of chemotherapy indications and possible side effects.  Teachback done

## 2019-09-29 NOTE — Patient Instructions (Signed)
[facility] Discharge Instructions for Patients Receiving Chemotherapy  Today you received the following chemotherapy agents Yervoy and Opdivo  To help prevent nausea and vomiting after your treatment, we encourage you to take your nausea medication    If you develop nausea and vomiting that is not controlled by your nausea medication, call the clinic.   BELOW ARE SYMPTOMS THAT SHOULD BE REPORTED IMMEDIATELY:  *FEVER GREATER THAN 100.5 F  *CHILLS WITH OR WITHOUT FEVER  NAUSEA AND VOMITING THAT IS NOT CONTROLLED WITH YOUR NAUSEA MEDICATION  *UNUSUAL SHORTNESS OF BREATH  *UNUSUAL BRUISING OR BLEEDING  TENDERNESS IN MOUTH AND THROAT WITH OR WITHOUT PRESENCE OF ULCERS  *URINARY PROBLEMS  *BOWEL PROBLEMS  UNUSUAL RASH Items with * indicate a potential emergency and should be followed up as soon as possible.  Feel free to call the clinic should you have any questions or concerns. The clinic phone number is [phone].  Please show the [facility] at check-in to the Emergency Department and triage nurse.

## 2019-09-30 ENCOUNTER — Encounter: Payer: Self-pay | Admitting: Radiation Oncology

## 2019-09-30 ENCOUNTER — Ambulatory Visit
Admission: RE | Admit: 2019-09-30 | Discharge: 2019-09-30 | Disposition: A | Discharge status: Home / Self Care | Payer: BC Managed Care – PPO | Source: Ambulatory Visit | Attending: Radiation Oncology | Admitting: Radiation Oncology

## 2019-09-30 ENCOUNTER — Other Ambulatory Visit: Payer: Self-pay

## 2019-09-30 VITALS — BP 162/73 | HR 98 | Temp 98.2°F | Resp 18 | Ht 67.0 in | Wt 222.4 lb

## 2019-09-30 DIAGNOSIS — I700 Atherosclerosis of aorta: Secondary | ICD-10-CM | POA: Diagnosis not present

## 2019-09-30 DIAGNOSIS — Z809 Family history of malignant neoplasm, unspecified: Secondary | ICD-10-CM | POA: Diagnosis not present

## 2019-09-30 DIAGNOSIS — C642 Malignant neoplasm of left kidney, except renal pelvis: Secondary | ICD-10-CM | POA: Insufficient documentation

## 2019-09-30 DIAGNOSIS — Z7982 Long term (current) use of aspirin: Secondary | ICD-10-CM | POA: Insufficient documentation

## 2019-09-30 DIAGNOSIS — G4730 Sleep apnea, unspecified: Secondary | ICD-10-CM | POA: Insufficient documentation

## 2019-09-30 DIAGNOSIS — F17210 Nicotine dependence, cigarettes, uncomplicated: Secondary | ICD-10-CM | POA: Insufficient documentation

## 2019-09-30 DIAGNOSIS — K760 Fatty (change of) liver, not elsewhere classified: Secondary | ICD-10-CM | POA: Insufficient documentation

## 2019-09-30 DIAGNOSIS — G40909 Epilepsy, unspecified, not intractable, without status epilepticus: Secondary | ICD-10-CM | POA: Insufficient documentation

## 2019-09-30 DIAGNOSIS — G893 Neoplasm related pain (acute) (chronic): Secondary | ICD-10-CM | POA: Diagnosis not present

## 2019-09-30 DIAGNOSIS — J439 Emphysema, unspecified: Secondary | ICD-10-CM | POA: Diagnosis not present

## 2019-09-30 DIAGNOSIS — C7951 Secondary malignant neoplasm of bone: Secondary | ICD-10-CM | POA: Insufficient documentation

## 2019-09-30 DIAGNOSIS — C7952 Secondary malignant neoplasm of bone marrow: Secondary | ICD-10-CM

## 2019-09-30 DIAGNOSIS — Z79899 Other long term (current) drug therapy: Secondary | ICD-10-CM | POA: Diagnosis not present

## 2019-09-30 HISTORY — DX: Malignant (primary) neoplasm, unspecified: C80.1

## 2019-09-30 LAB — T4: T4, Total: 6.4 ug/dL (ref 4.5–12.0)

## 2019-10-01 ENCOUNTER — Encounter: Payer: Self-pay | Admitting: *Deleted

## 2019-10-01 DIAGNOSIS — Z9889 Other specified postprocedural states: Secondary | ICD-10-CM | POA: Diagnosis not present

## 2019-10-01 NOTE — Progress Notes (Signed)
Called to check on patient s/p first cycle of treatment. Message left requesting call back. Will continue to follow.   Oncology Nurse Navigator Documentation  Oncology Nurse Navigator Flowsheets 10/01/2019  Abnormal Finding Date -  Confirmed Diagnosis Date -  Diagnosis Status -  Planned Course of Treatment -  Phase of Treatment -  Chemotherapy Pending- Reason: -  Chemotherapy Actual Start Date: -  Navigator Follow Up Date: -  Navigator Follow Up Reason: -  Navigator Location [facility]  Referral Date to RadOnc/MedOnc -  Navigator Encounter Type Telephone  Telephone Outgoing Call;Patient Update  Treatment Initiated Date -  Patient Visit Type MedOnc  Treatment Phase Active Tx  Barriers/Navigation Needs Coordination of Care;Education;Family Concerns  Education -  Interventions -  Acuity Level 2-Minimal Needs (1-2 Barriers Identified)  Referrals -  Coordination of Care -  Education Method -  Support Groups/Services Friends and Family  Time Spent with Patient 15

## 2019-10-05 ENCOUNTER — Encounter: Payer: Self-pay | Admitting: Hematology & Oncology

## 2019-10-05 ENCOUNTER — Ambulatory Visit
Admission: RE | Admit: 2019-10-05 | Discharge: 2019-10-05 | Disposition: A | Discharge status: Home / Self Care | Payer: BC Managed Care – PPO | Source: Ambulatory Visit | Attending: Radiation Oncology | Admitting: Radiation Oncology

## 2019-10-05 DIAGNOSIS — C642 Malignant neoplasm of left kidney, except renal pelvis: Secondary | ICD-10-CM | POA: Insufficient documentation

## 2019-10-05 DIAGNOSIS — C7951 Secondary malignant neoplasm of bone: Secondary | ICD-10-CM | POA: Insufficient documentation

## 2019-10-05 DIAGNOSIS — Z510 Encounter for antineoplastic radiation therapy: Secondary | ICD-10-CM | POA: Insufficient documentation

## 2019-10-08 ENCOUNTER — Other Ambulatory Visit: Payer: Self-pay | Admitting: Family

## 2019-10-09 ENCOUNTER — Other Ambulatory Visit: Payer: Self-pay | Admitting: Family

## 2019-10-12 DIAGNOSIS — C7951 Secondary malignant neoplasm of bone: Secondary | ICD-10-CM | POA: Diagnosis not present

## 2019-10-12 DIAGNOSIS — C642 Malignant neoplasm of left kidney, except renal pelvis: Secondary | ICD-10-CM | POA: Insufficient documentation

## 2019-10-12 DIAGNOSIS — Z510 Encounter for antineoplastic radiation therapy: Secondary | ICD-10-CM | POA: Insufficient documentation

## 2019-10-13 ENCOUNTER — Ambulatory Visit
Admission: RE | Admit: 2019-10-13 | Discharge: 2019-10-13 | Disposition: A | Discharge status: Home / Self Care | Payer: BC Managed Care – PPO | Source: Ambulatory Visit | Attending: Radiation Oncology | Admitting: Radiation Oncology

## 2019-10-13 ENCOUNTER — Other Ambulatory Visit: Payer: Self-pay

## 2019-10-13 DIAGNOSIS — C642 Malignant neoplasm of left kidney, except renal pelvis: Secondary | ICD-10-CM | POA: Diagnosis not present

## 2019-10-13 DIAGNOSIS — Z510 Encounter for antineoplastic radiation therapy: Secondary | ICD-10-CM | POA: Diagnosis not present

## 2019-10-13 DIAGNOSIS — C7951 Secondary malignant neoplasm of bone: Secondary | ICD-10-CM | POA: Diagnosis not present

## 2019-10-14 ENCOUNTER — Ambulatory Visit
Admission: RE | Admit: 2019-10-14 | Discharge: 2019-10-14 | Disposition: A | Discharge status: Home / Self Care | Payer: BC Managed Care – PPO | Source: Ambulatory Visit | Attending: Radiation Oncology | Admitting: Radiation Oncology

## 2019-10-14 ENCOUNTER — Other Ambulatory Visit: Payer: Self-pay | Admitting: *Deleted

## 2019-10-14 ENCOUNTER — Encounter: Payer: Self-pay | Admitting: *Deleted

## 2019-10-14 DIAGNOSIS — C642 Malignant neoplasm of left kidney, except renal pelvis: Secondary | ICD-10-CM | POA: Diagnosis not present

## 2019-10-14 DIAGNOSIS — C7951 Secondary malignant neoplasm of bone: Secondary | ICD-10-CM | POA: Diagnosis not present

## 2019-10-14 DIAGNOSIS — Z510 Encounter for antineoplastic radiation therapy: Secondary | ICD-10-CM | POA: Diagnosis not present

## 2019-10-14 MED ORDER — OXYCODONE HCL ER 15 MG PO T12A
15.0000 mg | EXTENDED_RELEASE_TABLET | Freq: Two times a day (BID) | ORAL | 0 refills | Status: DC
Start: 2019-10-14 — End: 2019-11-19

## 2019-10-14 MED ORDER — OXYCODONE HCL 10 MG PO TABS
10.0000 mg | ORAL_TABLET | ORAL | 0 refills | Status: DC | PRN
Start: 2019-10-14 — End: 2019-11-19

## 2019-10-14 NOTE — Progress Notes (Signed)
Oncology Nurse Navigator Documentation  Oncology Nurse Navigator Flowsheets 10/14/2019  Abnormal Finding Date -  Confirmed Diagnosis Date -  Diagnosis Status -  Planned Course of Treatment -  Phase of Treatment Radiation  Chemotherapy Pending- Reason: -  Chemotherapy Actual Start Date: 10/13/2019  Navigator Follow Up Date: 10/20/2019  Navigator Follow Up Reason: Follow-up Appointment  Navigator Location [facility]  Referral Date to RadOnc/MedOnc -  Navigator Encounter Type Appt/Treatment Plan Review  Telephone -  Treatment Initiated Date -  Patient Visit Type MedOnc  Treatment Phase Active Tx  Barriers/Navigation Needs Coordination of Care;Education;Family Concerns  Education -  Interventions None Required  Acuity Level 2-Minimal Needs (1-2 Barriers Identified)  Referrals -  Coordination of Care -  Education Method -  Support Groups/Services Friends and Family  Time Spent with Patient 15

## 2019-10-15 ENCOUNTER — Ambulatory Visit
Admission: RE | Admit: 2019-10-15 | Discharge: 2019-10-15 | Disposition: A | Discharge status: Home / Self Care | Payer: BC Managed Care – PPO | Source: Ambulatory Visit | Attending: Radiation Oncology | Admitting: Radiation Oncology

## 2019-10-15 DIAGNOSIS — C642 Malignant neoplasm of left kidney, except renal pelvis: Secondary | ICD-10-CM | POA: Diagnosis not present

## 2019-10-15 DIAGNOSIS — Z510 Encounter for antineoplastic radiation therapy: Secondary | ICD-10-CM | POA: Diagnosis not present

## 2019-10-15 DIAGNOSIS — C7951 Secondary malignant neoplasm of bone: Secondary | ICD-10-CM | POA: Diagnosis not present

## 2019-10-16 ENCOUNTER — Ambulatory Visit
Admission: RE | Admit: 2019-10-16 | Discharge: 2019-10-16 | Disposition: A | Discharge status: Home / Self Care | Payer: BC Managed Care – PPO | Source: Ambulatory Visit | Attending: Radiation Oncology | Admitting: Radiation Oncology

## 2019-10-16 DIAGNOSIS — C7951 Secondary malignant neoplasm of bone: Secondary | ICD-10-CM | POA: Diagnosis not present

## 2019-10-16 DIAGNOSIS — Z510 Encounter for antineoplastic radiation therapy: Secondary | ICD-10-CM | POA: Diagnosis not present

## 2019-10-16 DIAGNOSIS — C642 Malignant neoplasm of left kidney, except renal pelvis: Secondary | ICD-10-CM | POA: Diagnosis not present

## 2019-10-19 ENCOUNTER — Telehealth: Payer: Self-pay | Admitting: *Deleted

## 2019-10-19 ENCOUNTER — Other Ambulatory Visit: Payer: Self-pay

## 2019-10-19 ENCOUNTER — Ambulatory Visit
Admission: RE | Admit: 2019-10-19 | Discharge: 2019-10-19 | Disposition: A | Discharge status: Home / Self Care | Payer: BC Managed Care – PPO | Source: Ambulatory Visit | Attending: Radiation Oncology | Admitting: Radiation Oncology

## 2019-10-19 DIAGNOSIS — C642 Malignant neoplasm of left kidney, except renal pelvis: Secondary | ICD-10-CM | POA: Diagnosis not present

## 2019-10-19 DIAGNOSIS — Z510 Encounter for antineoplastic radiation therapy: Secondary | ICD-10-CM | POA: Diagnosis not present

## 2019-10-19 DIAGNOSIS — C7951 Secondary malignant neoplasm of bone: Secondary | ICD-10-CM | POA: Diagnosis not present

## 2019-10-19 NOTE — Telephone Encounter (Signed)
Call received from patient's wife stating that she has "cold like symptoms" and would like to know if she should be tested for Covid-19.  Instructed pt.'s wife that yes, she should be tested for Covid-19 today.  Pt.'s wife states that she will be tested today.

## 2019-10-20 ENCOUNTER — Inpatient Hospital Stay: Payer: BC Managed Care – PPO | Attending: Hematology & Oncology

## 2019-10-20 ENCOUNTER — Encounter: Payer: Self-pay | Admitting: Hematology & Oncology

## 2019-10-20 ENCOUNTER — Encounter: Payer: Self-pay | Admitting: *Deleted

## 2019-10-20 ENCOUNTER — Ambulatory Visit
Admission: RE | Admit: 2019-10-20 | Discharge: 2019-10-20 | Disposition: A | Discharge status: Home / Self Care | Payer: BC Managed Care – PPO | Source: Ambulatory Visit | Attending: Radiation Oncology | Admitting: Radiation Oncology

## 2019-10-20 ENCOUNTER — Telehealth: Payer: Self-pay | Admitting: Hematology & Oncology

## 2019-10-20 ENCOUNTER — Inpatient Hospital Stay (HOSPITAL_BASED_OUTPATIENT_CLINIC_OR_DEPARTMENT_OTHER): Payer: BC Managed Care – PPO | Admitting: Hematology & Oncology

## 2019-10-20 ENCOUNTER — Inpatient Hospital Stay: Payer: BC Managed Care – PPO

## 2019-10-20 ENCOUNTER — Other Ambulatory Visit: Payer: Self-pay

## 2019-10-20 VITALS — BP 156/62 | HR 105 | Temp 99.0°F | Wt 210.5 lb

## 2019-10-20 DIAGNOSIS — Z5112 Encounter for antineoplastic immunotherapy: Secondary | ICD-10-CM | POA: Diagnosis not present

## 2019-10-20 DIAGNOSIS — C642 Malignant neoplasm of left kidney, except renal pelvis: Secondary | ICD-10-CM | POA: Insufficient documentation

## 2019-10-20 DIAGNOSIS — Z7982 Long term (current) use of aspirin: Secondary | ICD-10-CM | POA: Insufficient documentation

## 2019-10-20 DIAGNOSIS — Z923 Personal history of irradiation: Secondary | ICD-10-CM | POA: Diagnosis not present

## 2019-10-20 DIAGNOSIS — G4739 Other sleep apnea: Secondary | ICD-10-CM

## 2019-10-20 DIAGNOSIS — Z79899 Other long term (current) drug therapy: Secondary | ICD-10-CM | POA: Diagnosis not present

## 2019-10-20 DIAGNOSIS — Z510 Encounter for antineoplastic radiation therapy: Secondary | ICD-10-CM | POA: Diagnosis not present

## 2019-10-20 DIAGNOSIS — C7951 Secondary malignant neoplasm of bone: Secondary | ICD-10-CM | POA: Diagnosis not present

## 2019-10-20 LAB — CBC WITH DIFFERENTIAL (CANCER CENTER ONLY)
Abs Immature Granulocytes: 0.08 10*3/uL — ABNORMAL HIGH (ref 0.00–0.07)
Basophils Absolute: 0 10*3/uL (ref 0.0–0.1)
Basophils Relative: 1 %
Eosinophils Absolute: 0.1 10*3/uL (ref 0.0–0.5)
Eosinophils Relative: 3 %
HCT: 36.8 % — ABNORMAL LOW (ref 39.0–52.0)
Hemoglobin: 12.1 g/dL — ABNORMAL LOW (ref 13.0–17.0)
Immature Granulocytes: 2 %
Lymphocytes Relative: 9 %
Lymphs Abs: 0.5 10*3/uL — ABNORMAL LOW (ref 0.7–4.0)
MCH: 28.3 pg (ref 26.0–34.0)
MCHC: 32.9 g/dL (ref 30.0–36.0)
MCV: 86.2 fL (ref 80.0–100.0)
Monocytes Absolute: 0.4 10*3/uL (ref 0.1–1.0)
Monocytes Relative: 7 %
Neutro Abs: 4.4 10*3/uL (ref 1.7–7.7)
Neutrophils Relative %: 78 %
Platelet Count: 255 10*3/uL (ref 150–400)
RBC: 4.27 MIL/uL (ref 4.22–5.81)
RDW: 14 % (ref 11.5–15.5)
WBC Count: 5.5 10*3/uL (ref 4.0–10.5)
nRBC: 0 % (ref 0.0–0.2)

## 2019-10-20 LAB — CMP (CANCER CENTER ONLY)
ALT: 15 U/L (ref 0–44)
AST: 13 U/L — ABNORMAL LOW (ref 15–41)
Albumin: 3.8 g/dL (ref 3.5–5.0)
Alkaline Phosphatase: 127 U/L — ABNORMAL HIGH (ref 38–126)
Anion gap: 8 (ref 5–15)
BUN: 12 mg/dL (ref 6–20)
CO2: 26 mmol/L (ref 22–32)
Calcium: 9 mg/dL (ref 8.9–10.3)
Chloride: 100 mmol/L (ref 98–111)
Creatinine: 0.85 mg/dL (ref 0.61–1.24)
GFR, Est AFR Am: >60 mL/min (ref >60)
GFR, Estimated: >60 mL/min (ref >60)
Glucose, Bld: 142 mg/dL — ABNORMAL HIGH (ref 70–99)
Potassium: 4.1 mmol/L (ref 3.5–5.1)
Sodium: 134 mmol/L — ABNORMAL LOW (ref 135–145)
Total Bilirubin: 0.5 mg/dL (ref 0.3–1.2)
Total Protein: 6.3 g/dL — ABNORMAL LOW (ref 6.5–8.1)

## 2019-10-20 MED ORDER — DIPHENHYDRAMINE HCL 50 MG/ML IJ SOLN
INTRAMUSCULAR | Status: AC
  Filled 2019-10-20: qty 1

## 2019-10-20 MED ORDER — FAMOTIDINE IN NACL 20-0.9 MG/50ML-% IV SOLN
INTRAVENOUS | Status: AC
  Filled 2019-10-20: qty 50

## 2019-10-20 MED ORDER — SODIUM CHLORIDE 0.9 % IV SOLN
Freq: Once | INTRAVENOUS | Status: AC
  Filled 2019-10-20: qty 250

## 2019-10-20 MED ORDER — HEPARIN SOD (PORK) LOCK FLUSH 100 UNIT/ML IV SOLN
500.0000 [IU] | Freq: Once | INTRAVENOUS | Status: AC | PRN
  Administered 2019-10-20: 500 [IU]
  Filled 2019-10-20: qty 5

## 2019-10-20 MED ORDER — DIPHENHYDRAMINE HCL 50 MG/ML IJ SOLN
25.0000 mg | Freq: Once | INTRAMUSCULAR | Status: AC
  Administered 2019-10-20: 25 mg via INTRAVENOUS

## 2019-10-20 MED ORDER — SODIUM CHLORIDE 0.9 % IV SOLN
2.9800 mg/kg | Freq: Once | INTRAVENOUS | Status: AC
  Administered 2019-10-20: 300 mg via INTRAVENOUS
  Filled 2019-10-20: qty 30

## 2019-10-20 MED ORDER — SODIUM CHLORIDE 0.9% FLUSH
10.0000 mL | INTRAVENOUS | Status: DC | PRN
  Administered 2019-10-20: 10 mL
  Filled 2019-10-20: qty 10

## 2019-10-20 MED ORDER — SODIUM CHLORIDE 0.9 % IV SOLN
1.0000 mg/kg | Freq: Once | INTRAVENOUS | Status: AC
  Administered 2019-10-20: 100 mg via INTRAVENOUS
  Filled 2019-10-20: qty 20

## 2019-10-20 MED ORDER — FAMOTIDINE IN NACL 20-0.9 MG/50ML-% IV SOLN
20.0000 mg | Freq: Once | INTRAVENOUS | Status: AC
  Administered 2019-10-20: 20 mg via INTRAVENOUS

## 2019-10-20 NOTE — Progress Notes (Signed)
Hematology and Oncology Follow Up Visit  Vincent David [id number] 1965-03-06 54 y.o. 10/20/2019   Principle Diagnosis:   Metastatic Renal Cell Carcinoma - bone mets  Current Therapy:    Nivoumab/Ipilimumab -- s/p cycle #1 -- started on 09/29/2019  Xgeva 120 mg sq q 3 months -- next dose in 12/2019  XRT to right iliac mass/ L5 vertebral body/ Left femoral neck  LEFT hip repair     Interim History:  Vincent David is back for follow-up. He is doing quite well. He is doing radiation therapy right now. He has had no problems with the radiation. He has another 9-10 treatments to go.  He has had no problems with the first cycle of immunotherapy. He has had no diarrhea. He has had no rashes.  He has lost a little bit of weight. His appetite was down after his surgery but this seems to be coming back.  He has had no problems with cough or shortness of breath.  His pain is getting better. He is on the OxyContin twice a day. He only takes 1 or 2 of the oxycodone for breakthrough pain now.  He is off the Zanaflex and the gabapentin.  He has had no fever. He has had no leg swelling. This actually improved when he got off the gabapentin.  There has been no headaches.  Currently, I would say his performance status is ECOG 1.  Medications:  Current Outpatient Medications:  .  aspirin EC 81 MG tablet, Take 1 tablet (81 mg total) by mouth 2 (two) times daily., Disp: 60 tablet, Rfl: 0 .  lamoTRIgine (LAMICTAL) 100 MG tablet, Take 1 tablet (100 mg total) by mouth 2 (two) times daily., Disp: 180 tablet, Rfl: 1 .  nicotine (NICODERM CQ - DOSED IN MG/24 HR) 7 mg/24hr patch, Place 1 patch (7 mg total) onto the skin daily., Disp: 28 patch, Rfl: 0 .  oxyCODONE (OXYCONTIN) 15 mg 12 hr tablet, Take 1 tablet (15 mg total) by mouth every 12 (twelve) hours., Disp: 60 tablet, Rfl: 0 .  Oxycodone HCl 10 MG TABS, Take 1 tablet (10 mg total) by mouth every 4 (four) hours as needed., Disp: 90 tablet, Rfl: 0 .   gabapentin (NEURONTIN) 300 MG capsule, Take 1 capsule (300 mg total) by mouth 3 (three) times daily. (Patient not taking: Reported on 09/30/2019), Disp: 90 capsule, Rfl: 4 .  tiZANidine (ZANAFLEX) 2 MG tablet, Take 1 tablet (2 mg total) by mouth every 6 (six) hours as needed., Disp: 60 tablet, Rfl: 0  Allergies: No Known Allergies  Past Medical History, Surgical history, Social history, and Family History were reviewed and updated.  Review of Systems: Review of Systems  Constitutional: Negative.   HENT:  Negative.   Eyes: Negative.   Respiratory: Negative.   Cardiovascular: Negative.   Gastrointestinal: Negative.   Endocrine: Negative.   Musculoskeletal: Positive for back pain and flank pain.  Skin: Negative.   Neurological: Negative.   Hematological: Negative.   Psychiatric/Behavioral: Negative.     Physical Exam:  weight is 210 lb 8 oz (95.5 kg). His oral temperature is 99 F (37.2 C). His blood pressure is 156/62 (abnormal) and his pulse is 105 (abnormal). His oxygen saturation is 97%.   Wt Readings from Last 3 Encounters:  10/20/19 210 lb 8 oz (95.5 kg)  09/30/19 222 lb 6.4 oz (100.9 kg)  09/29/19 223 lb (101.2 kg)    Physical Exam Vitals reviewed.  HENT:     Head: Normocephalic and  atraumatic.  Eyes:     Pupils: Pupils are equal, round, and reactive to light.  Cardiovascular:     Rate and Rhythm: Normal rate and regular rhythm.     Heart sounds: Normal heart sounds.  Pulmonary:     Effort: Pulmonary effort is normal.     Breath sounds: Normal breath sounds.  Abdominal:     General: Bowel sounds are normal.     Palpations: Abdomen is soft.  Musculoskeletal:        General: No tenderness or deformity. Normal range of motion.     Cervical back: Normal range of motion.  Lymphadenopathy:     Cervical: No cervical adenopathy.  Skin:    General: Skin is warm and dry.     Findings: No erythema or rash.  Neurological:     Mental Status: He is alert and oriented to  person, place, and time.  Psychiatric:        Behavior: Behavior normal.        Thought Content: Thought content normal.        Judgment: Judgment normal.      Lab Results  Component Value Date   WBC 5.5 10/20/2019   HGB 12.1 (L) 10/20/2019   HCT 36.8 (L) 10/20/2019   MCV 86.2 10/20/2019   PLT 255 10/20/2019     Chemistry      Component Value Date/Time   NA 133 (L) 09/29/2019 0950   K 4.4 09/29/2019 0950   CL 99 09/29/2019 0950   CO2 25 09/29/2019 0950   BUN 10 09/29/2019 0950   CREATININE 0.91 09/29/2019 0950   CREATININE 1.06 08/28/2019 0824   CREATININE 1.15 08/25/2019 1450      Component Value Date/Time   CALCIUM 8.8 (L) 09/29/2019 0950   ALKPHOS 159 (H) 09/29/2019 0950   AST 14 (L) 09/29/2019 0950   AST 13 (L) 08/28/2019 0824   ALT 14 09/29/2019 0950   ALT 17 08/28/2019 0824   BILITOT 0.5 09/29/2019 0950   BILITOT 0.4 08/28/2019 0824      Impression and Plan: Vincent David is a very nice 54 year old white male.  He has metastatic renal cell carcinoma.  For some reason, his disease he clearly favors his bones.  His lungs and liver and lymph nodes all appear to be clean.  I am happy that he is doing well with the radiation therapy and the immunotherapy. Again, the fact that he is not taking much in the way of pain medication is certainly encouraging.  Of note, his alkaline phosphatase is coming down. I think this also is a good indicator that things are working.  We will plan for second cycle of immunotherapy today. We will then plan to get her back in 3 weeks for his third cycle.  I probably would not do any scans and will after the fourth cycle of immunotherapy.    9/14/202111:49 AM

## 2019-10-20 NOTE — Telephone Encounter (Signed)
Appointments were already scheduled per 9/14 los

## 2019-10-20 NOTE — Progress Notes (Signed)
Oncology Nurse Navigator Documentation  Oncology Nurse Navigator Flowsheets 10/20/2019  Abnormal Finding Date -  Confirmed Diagnosis Date -  Diagnosis Status -  Planned Course of Treatment -  Phase of Treatment -  Chemotherapy Pending- Reason: -  Chemotherapy Actual Start Date: -  Navigator Follow Up Date: 11/10/2019  Navigator Follow Up Reason: Follow-up Appointment;Chemotherapy  Navigator Location [facility]  Referral Date to RadOnc/MedOnc -  Navigator Encounter Type Appt/Treatment Plan Review;Treatment  Telephone -  Treatment Initiated Date -  Patient Visit Type MedOnc  Treatment Phase Active Tx  Barriers/Navigation Needs Coordination of Care;Education;Family Concerns  Education -  Interventions None Required;Psycho-Social Support  Acuity Level 2-Minimal Needs (1-2 Barriers Identified)  Referrals -  Coordination of Care -  Education Method -  Support Groups/Services Friends and Family  Time Spent with Patient 15

## 2019-10-20 NOTE — Patient Instructions (Signed)
Implanted Port Insertion, Care After °This sheet gives you information about how to care for yourself after your procedure. Your health care provider may also give you more specific instructions. If you have problems or questions, contact your health care provider. °What can I expect after the procedure? °After the procedure, it is common to have: °· Discomfort at the port insertion site. °· Bruising on the skin over the port. This should improve over 3-4 days. °Follow these instructions at home: °Port care °· After your port is placed, you will get a manufacturer's information card. The card has information about your port. Keep this card with you at all times. °· Take care of the port as told by your health care provider. Ask your health care provider if you or a family member can get training for taking care of the port at home. A home health care nurse may also take care of the port. °· Make sure to remember what type of port you have. °Incision care ° °  ° °· Follow instructions from your health care provider about how to take care of your port insertion site. Make sure you: °? Wash your hands with soap and water before and after you change your bandage (dressing). If soap and water are not available, use hand sanitizer. °? Change your dressing as told by your health care provider. °? Leave stitches (sutures), skin glue, or adhesive strips in place. These skin closures may need to stay in place for 2 weeks or longer. If adhesive strip edges start to loosen and curl up, you may trim the loose edges. Do not remove adhesive strips completely unless your health care provider tells you to do that. °· Check your port insertion site every day for signs of infection. Check for: °? Redness, swelling, or pain. °? Fluid or blood. °? Warmth. °? Pus or a bad smell. °Activity °· Return to your normal activities as told by your health care provider. Ask your health care provider what activities are safe for you. °· Do not  lift anything that is heavier than 10 lb (4.5 kg), or the limit that you are told, until your health care provider says that it is safe. °General instructions °· Take over-the-counter and prescription medicines only as told by your health care provider. °· Do not take baths, swim, or use a hot tub until your health care provider approves. Ask your health care provider if you may take showers. You may only be allowed to take sponge baths. °· Do not drive for 24 hours if you were given a sedative during your procedure. °· Wear a medical alert bracelet in case of an emergency. This will tell any health care providers that you have a port. °· Keep all follow-up visits as told by your health care provider. This is important. °Contact a health care provider if: °· You cannot flush your port with saline as directed, or you cannot draw blood from the port. °· You have a fever or chills. °· You have redness, swelling, or pain around your port insertion site. °· You have fluid or blood coming from your port insertion site. °· Your port insertion site feels warm to the touch. °· You have pus or a bad smell coming from the port insertion site. °Get help right away if: °· You have chest pain or shortness of breath. °· You have bleeding from your port that you cannot control. °Summary °· Take care of the port as told by your health   care provider. Keep the manufacturer's information card with you at all times. °· Change your dressing as told by your health care provider. °· Contact a health care provider if you have a fever or chills or if you have redness, swelling, or pain around your port insertion site. °· Keep all follow-up visits as told by your health care provider. °This information is not intended to replace advice given to you by your health care provider. Make sure you discuss any questions you have with your health care provider. °Document Revised: 08/20/2017 Document Reviewed: 08/20/2017 °Elsevier Patient Education ©  2020 [employer] ° °

## 2019-10-21 ENCOUNTER — Ambulatory Visit
Admission: RE | Admit: 2019-10-21 | Discharge: 2019-10-21 | Disposition: A | Discharge status: Home / Self Care | Payer: BC Managed Care – PPO | Source: Ambulatory Visit | Attending: Radiation Oncology | Admitting: Radiation Oncology

## 2019-10-21 ENCOUNTER — Other Ambulatory Visit: Payer: Self-pay

## 2019-10-21 DIAGNOSIS — C642 Malignant neoplasm of left kidney, except renal pelvis: Secondary | ICD-10-CM | POA: Diagnosis not present

## 2019-10-21 DIAGNOSIS — C7951 Secondary malignant neoplasm of bone: Secondary | ICD-10-CM | POA: Diagnosis not present

## 2019-10-21 DIAGNOSIS — Z510 Encounter for antineoplastic radiation therapy: Secondary | ICD-10-CM | POA: Diagnosis not present

## 2019-10-21 LAB — T4: T4, Total: 5.7 ug/dL (ref 4.5–12.0)

## 2019-10-21 LAB — TSH: TSH: 2.553 u[IU]/mL (ref 0.320–4.118)

## 2019-10-21 LAB — LACTATE DEHYDROGENASE: LDH: 198 U/L — ABNORMAL HIGH (ref 98–192)

## 2019-10-22 ENCOUNTER — Ambulatory Visit
Admission: RE | Admit: 2019-10-22 | Discharge: 2019-10-22 | Disposition: A | Discharge status: Home / Self Care | Payer: BC Managed Care – PPO | Source: Ambulatory Visit | Attending: Radiation Oncology | Admitting: Radiation Oncology

## 2019-10-22 ENCOUNTER — Other Ambulatory Visit: Payer: Self-pay

## 2019-10-22 DIAGNOSIS — C642 Malignant neoplasm of left kidney, except renal pelvis: Secondary | ICD-10-CM | POA: Diagnosis not present

## 2019-10-22 DIAGNOSIS — Z510 Encounter for antineoplastic radiation therapy: Secondary | ICD-10-CM | POA: Diagnosis not present

## 2019-10-22 DIAGNOSIS — C7951 Secondary malignant neoplasm of bone: Secondary | ICD-10-CM | POA: Diagnosis not present

## 2019-10-23 ENCOUNTER — Other Ambulatory Visit: Payer: Self-pay

## 2019-10-23 ENCOUNTER — Ambulatory Visit
Admission: RE | Admit: 2019-10-23 | Discharge: 2019-10-23 | Disposition: A | Discharge status: Home / Self Care | Payer: BC Managed Care – PPO | Source: Ambulatory Visit | Attending: Radiation Oncology | Admitting: Radiation Oncology

## 2019-10-23 DIAGNOSIS — C642 Malignant neoplasm of left kidney, except renal pelvis: Secondary | ICD-10-CM | POA: Diagnosis not present

## 2019-10-23 DIAGNOSIS — C7951 Secondary malignant neoplasm of bone: Secondary | ICD-10-CM | POA: Diagnosis not present

## 2019-10-23 DIAGNOSIS — Z510 Encounter for antineoplastic radiation therapy: Secondary | ICD-10-CM | POA: Diagnosis not present

## 2019-10-26 ENCOUNTER — Ambulatory Visit
Admission: RE | Admit: 2019-10-26 | Discharge: 2019-10-26 | Disposition: A | Discharge status: Home / Self Care | Payer: BC Managed Care – PPO | Source: Ambulatory Visit | Attending: Radiation Oncology | Admitting: Radiation Oncology

## 2019-10-26 DIAGNOSIS — C7951 Secondary malignant neoplasm of bone: Secondary | ICD-10-CM | POA: Diagnosis not present

## 2019-10-26 DIAGNOSIS — Z510 Encounter for antineoplastic radiation therapy: Secondary | ICD-10-CM | POA: Diagnosis not present

## 2019-10-26 DIAGNOSIS — C642 Malignant neoplasm of left kidney, except renal pelvis: Secondary | ICD-10-CM | POA: Diagnosis not present

## 2019-10-27 ENCOUNTER — Ambulatory Visit
Admission: RE | Admit: 2019-10-27 | Discharge: 2019-10-27 | Disposition: A | Discharge status: Home / Self Care | Payer: BC Managed Care – PPO | Source: Ambulatory Visit | Attending: Radiation Oncology | Admitting: Radiation Oncology

## 2019-10-27 DIAGNOSIS — C642 Malignant neoplasm of left kidney, except renal pelvis: Secondary | ICD-10-CM | POA: Diagnosis not present

## 2019-10-27 DIAGNOSIS — Z510 Encounter for antineoplastic radiation therapy: Secondary | ICD-10-CM | POA: Diagnosis not present

## 2019-10-27 DIAGNOSIS — C7951 Secondary malignant neoplasm of bone: Secondary | ICD-10-CM | POA: Diagnosis not present

## 2019-10-28 ENCOUNTER — Ambulatory Visit
Admission: RE | Admit: 2019-10-28 | Discharge: 2019-10-28 | Disposition: A | Discharge status: Home / Self Care | Payer: BC Managed Care – PPO | Source: Ambulatory Visit | Attending: Radiation Oncology | Admitting: Radiation Oncology

## 2019-10-28 DIAGNOSIS — Z510 Encounter for antineoplastic radiation therapy: Secondary | ICD-10-CM | POA: Diagnosis not present

## 2019-10-28 DIAGNOSIS — C7951 Secondary malignant neoplasm of bone: Secondary | ICD-10-CM | POA: Diagnosis not present

## 2019-10-28 DIAGNOSIS — C642 Malignant neoplasm of left kidney, except renal pelvis: Secondary | ICD-10-CM | POA: Diagnosis not present

## 2019-10-29 ENCOUNTER — Other Ambulatory Visit: Payer: Self-pay

## 2019-10-29 ENCOUNTER — Ambulatory Visit
Admission: RE | Admit: 2019-10-29 | Discharge: 2019-10-29 | Disposition: A | Discharge status: Home / Self Care | Payer: BC Managed Care – PPO | Source: Ambulatory Visit | Attending: Radiation Oncology | Admitting: Radiation Oncology

## 2019-10-29 DIAGNOSIS — C642 Malignant neoplasm of left kidney, except renal pelvis: Secondary | ICD-10-CM | POA: Diagnosis not present

## 2019-10-29 DIAGNOSIS — Z510 Encounter for antineoplastic radiation therapy: Secondary | ICD-10-CM | POA: Diagnosis not present

## 2019-10-29 DIAGNOSIS — C7951 Secondary malignant neoplasm of bone: Secondary | ICD-10-CM | POA: Diagnosis not present

## 2019-10-30 ENCOUNTER — Ambulatory Visit
Admission: RE | Admit: 2019-10-30 | Discharge: 2019-10-30 | Disposition: A | Discharge status: Home / Self Care | Payer: BC Managed Care – PPO | Source: Ambulatory Visit | Attending: Radiation Oncology | Admitting: Radiation Oncology

## 2019-10-30 ENCOUNTER — Encounter: Payer: Self-pay | Admitting: Radiation Oncology

## 2019-10-30 DIAGNOSIS — C7951 Secondary malignant neoplasm of bone: Secondary | ICD-10-CM | POA: Diagnosis not present

## 2019-10-30 DIAGNOSIS — Z510 Encounter for antineoplastic radiation therapy: Secondary | ICD-10-CM | POA: Diagnosis not present

## 2019-10-30 DIAGNOSIS — C642 Malignant neoplasm of left kidney, except renal pelvis: Secondary | ICD-10-CM | POA: Diagnosis not present

## 2019-11-08 ENCOUNTER — Other Ambulatory Visit: Payer: Self-pay | Admitting: Neurology

## 2019-11-10 ENCOUNTER — Inpatient Hospital Stay: Payer: BC Managed Care – PPO | Attending: Hematology & Oncology

## 2019-11-10 ENCOUNTER — Telehealth: Payer: Self-pay | Admitting: Hematology & Oncology

## 2019-11-10 ENCOUNTER — Encounter: Payer: Self-pay | Admitting: *Deleted

## 2019-11-10 ENCOUNTER — Inpatient Hospital Stay: Payer: BC Managed Care – PPO

## 2019-11-10 ENCOUNTER — Inpatient Hospital Stay (HOSPITAL_BASED_OUTPATIENT_CLINIC_OR_DEPARTMENT_OTHER): Payer: BC Managed Care – PPO | Admitting: Hematology & Oncology

## 2019-11-10 ENCOUNTER — Encounter: Payer: Self-pay | Admitting: Hematology & Oncology

## 2019-11-10 ENCOUNTER — Other Ambulatory Visit: Payer: Self-pay

## 2019-11-10 VITALS — BP 147/66 | HR 102 | Temp 98.5°F | Resp 18 | Wt 206.8 lb

## 2019-11-10 DIAGNOSIS — C642 Malignant neoplasm of left kidney, except renal pelvis: Secondary | ICD-10-CM

## 2019-11-10 DIAGNOSIS — C7951 Secondary malignant neoplasm of bone: Secondary | ICD-10-CM | POA: Insufficient documentation

## 2019-11-10 DIAGNOSIS — Z5112 Encounter for antineoplastic immunotherapy: Secondary | ICD-10-CM | POA: Insufficient documentation

## 2019-11-10 DIAGNOSIS — Z7982 Long term (current) use of aspirin: Secondary | ICD-10-CM | POA: Diagnosis not present

## 2019-11-10 DIAGNOSIS — G893 Neoplasm related pain (acute) (chronic): Secondary | ICD-10-CM | POA: Insufficient documentation

## 2019-11-10 DIAGNOSIS — Z79899 Other long term (current) drug therapy: Secondary | ICD-10-CM | POA: Insufficient documentation

## 2019-11-10 DIAGNOSIS — G4739 Other sleep apnea: Secondary | ICD-10-CM

## 2019-11-10 DIAGNOSIS — Z923 Personal history of irradiation: Secondary | ICD-10-CM | POA: Diagnosis not present

## 2019-11-10 LAB — CMP (CANCER CENTER ONLY)
ALT: 13 U/L (ref 0–44)
AST: 11 U/L — ABNORMAL LOW (ref 15–41)
Albumin: 4.1 g/dL (ref 3.5–5.0)
Alkaline Phosphatase: 129 U/L — ABNORMAL HIGH (ref 38–126)
Anion gap: 9 (ref 5–15)
BUN: 10 mg/dL (ref 6–20)
CO2: 27 mmol/L (ref 22–32)
Calcium: 9.3 mg/dL (ref 8.9–10.3)
Chloride: 101 mmol/L (ref 98–111)
Creatinine: 0.96 mg/dL (ref 0.61–1.24)
GFR, Estimated: >60 mL/min (ref >60)
Glucose, Bld: 133 mg/dL — ABNORMAL HIGH (ref 70–99)
Potassium: 3.6 mmol/L (ref 3.5–5.1)
Sodium: 137 mmol/L (ref 135–145)
Total Bilirubin: 0.4 mg/dL (ref 0.3–1.2)
Total Protein: 6.6 g/dL (ref 6.5–8.1)

## 2019-11-10 LAB — CBC WITH DIFFERENTIAL (CANCER CENTER ONLY)
Abs Immature Granulocytes: 0.07 10*3/uL (ref 0.00–0.07)
Basophils Absolute: 0 10*3/uL (ref 0.0–0.1)
Basophils Relative: 0 %
Eosinophils Absolute: 0 10*3/uL (ref 0.0–0.5)
Eosinophils Relative: 0 %
HCT: 37.5 % — ABNORMAL LOW (ref 39.0–52.0)
Hemoglobin: 12 g/dL — ABNORMAL LOW (ref 13.0–17.0)
Immature Granulocytes: 1 %
Lymphocytes Relative: 5 %
Lymphs Abs: 0.4 10*3/uL — ABNORMAL LOW (ref 0.7–4.0)
MCH: 27.8 pg (ref 26.0–34.0)
MCHC: 32 g/dL (ref 30.0–36.0)
MCV: 86.8 fL (ref 80.0–100.0)
Monocytes Absolute: 0.5 10*3/uL (ref 0.1–1.0)
Monocytes Relative: 7 %
Neutro Abs: 6.5 10*3/uL (ref 1.7–7.7)
Neutrophils Relative %: 87 %
Platelet Count: 296 10*3/uL (ref 150–400)
RBC: 4.32 MIL/uL (ref 4.22–5.81)
RDW: 15.8 % — ABNORMAL HIGH (ref 11.5–15.5)
WBC Count: 7.5 10*3/uL (ref 4.0–10.5)
nRBC: 0 % (ref 0.0–0.2)

## 2019-11-10 LAB — LACTATE DEHYDROGENASE: LDH: 168 U/L (ref 98–192)

## 2019-11-10 LAB — TSH: TSH: 2.786 u[IU]/mL (ref 0.320–4.118)

## 2019-11-10 MED ORDER — DIPHENHYDRAMINE HCL 50 MG/ML IJ SOLN
INTRAMUSCULAR | Status: AC
  Filled 2019-11-10: qty 1

## 2019-11-10 MED ORDER — SODIUM CHLORIDE 0.9 % IV SOLN
2.9800 mg/kg | Freq: Once | INTRAVENOUS | Status: AC
  Administered 2019-11-10: 300 mg via INTRAVENOUS
  Filled 2019-11-10: qty 30

## 2019-11-10 MED ORDER — DIPHENHYDRAMINE HCL 50 MG/ML IJ SOLN
25.0000 mg | Freq: Once | INTRAMUSCULAR | Status: AC
  Administered 2019-11-10: 25 mg via INTRAVENOUS

## 2019-11-10 MED ORDER — SODIUM CHLORIDE 0.9 % IV SOLN
1.0000 mg/kg | Freq: Once | INTRAVENOUS | Status: AC
  Administered 2019-11-10: 100 mg via INTRAVENOUS
  Filled 2019-11-10: qty 20

## 2019-11-10 MED ORDER — SODIUM CHLORIDE 0.9 % IV SOLN
Freq: Once | INTRAVENOUS | Status: AC
  Filled 2019-11-10: qty 250

## 2019-11-10 MED ORDER — FAMOTIDINE IN NACL 20-0.9 MG/50ML-% IV SOLN
20.0000 mg | Freq: Once | INTRAVENOUS | Status: AC
  Administered 2019-11-10: 20 mg via INTRAVENOUS

## 2019-11-10 MED ORDER — HEPARIN SOD (PORK) LOCK FLUSH 100 UNIT/ML IV SOLN
500.0000 [IU] | Freq: Once | INTRAVENOUS | Status: AC | PRN
  Administered 2019-11-10: 500 [IU]
  Filled 2019-11-10: qty 5

## 2019-11-10 MED ORDER — FAMOTIDINE IN NACL 20-0.9 MG/50ML-% IV SOLN
INTRAVENOUS | Status: AC
  Filled 2019-11-10: qty 50

## 2019-11-10 MED ORDER — SODIUM CHLORIDE 0.9% FLUSH
10.0000 mL | INTRAVENOUS | Status: DC | PRN
  Administered 2019-11-10: 10 mL
  Filled 2019-11-10: qty 10

## 2019-11-10 NOTE — Telephone Encounter (Signed)
Appointments were already scheduled at last visit per 10/5 los

## 2019-11-10 NOTE — Progress Notes (Signed)
Hematology and Oncology Follow Up Visit  Vincent David [id number] 11-Sep-1965 54 y.o. 11/10/2019   Principle Diagnosis:   Metastatic Renal Cell Carcinoma - bone mets  Current Therapy:    Nivoumab/Ipilimumab -- s/p cycle #2 -- started on 09/29/2019  Xgeva 120 mg sq q 3 months -- next dose in 12/2019  XRT to right iliac mass/ L5 vertebral body/ Left femoral neck  LEFT hip repair     Interim History:  Vincent David is back for follow-up. He is doing quite well.  He completed his radiation therapy.  He has the pain over on the right side is not as bad.  He is not using much pain medication.    He has had no problems with cough or shortness of breath.  He has had no diarrhea.  He has had no rashes.  There is been no fever.  He has had no headache.  There was an episode that he was a little bit disoriented.  I am not sure exactly what happened.  He thought maybe his blood sugar was a little bit on the low side.  He and his wife are planning on going up to see his family in November.  We will certainly work around his vacation.  Currently, I would say his performance status is ECOG 1.  Medications:  Current Outpatient Medications:  .  aspirin EC 81 MG tablet, Take 1 tablet (81 mg total) by mouth 2 (two) times daily., Disp: 60 tablet, Rfl: 0 .  lamoTRIgine (LAMICTAL) 100 MG tablet, Take 1 tablet (100 mg total) by mouth 2 (two) times daily., Disp: 180 tablet, Rfl: 1 .  nicotine (NICODERM CQ - DOSED IN MG/24 HR) 7 mg/24hr patch, Place 1 patch (7 mg total) onto the skin daily., Disp: 28 patch, Rfl: 0 .  oxyCODONE (OXYCONTIN) 15 mg 12 hr tablet, Take 1 tablet (15 mg total) by mouth every 12 (twelve) hours., Disp: 60 tablet, Rfl: 0 .  Oxycodone HCl 10 MG TABS, Take 1 tablet (10 mg total) by mouth every 4 (four) hours as needed., Disp: 90 tablet, Rfl: 0 .  gabapentin (NEURONTIN) 300 MG capsule, Take 1 capsule (300 mg total) by mouth 3 (three) times daily. (Patient not taking: Reported on  09/30/2019), Disp: 90 capsule, Rfl: 4 .  tiZANidine (ZANAFLEX) 2 MG tablet, Take 1 tablet (2 mg total) by mouth every 6 (six) hours as needed., Disp: 60 tablet, Rfl: 0  Allergies: No Known Allergies  Past Medical History, Surgical history, Social history, and Family History were reviewed and updated.  Review of Systems: Review of Systems  Constitutional: Negative.   HENT:  Negative.   Eyes: Negative.   Respiratory: Negative.   Cardiovascular: Negative.   Gastrointestinal: Negative.   Endocrine: Negative.   Musculoskeletal: Positive for back pain and flank pain.  Skin: Negative.   Neurological: Negative.   Hematological: Negative.   Psychiatric/Behavioral: Negative.     Physical Exam:  weight is 206 lb 12 oz (93.8 kg). His oral temperature is 98.5 F (36.9 C). His blood pressure is 147/66 (abnormal) and his pulse is 102 (abnormal). His respiration is 18 and oxygen saturation is 97%.   Wt Readings from Last 3 Encounters:  11/10/19 206 lb 12 oz (93.8 kg)  10/20/19 210 lb 8 oz (95.5 kg)  09/30/19 222 lb 6.4 oz (100.9 kg)    Physical Exam Vitals reviewed.  HENT:     Head: Normocephalic and atraumatic.  Eyes:     Pupils: Pupils are equal,  round, and reactive to light.  Cardiovascular:     Rate and Rhythm: Normal rate and regular rhythm.     Heart sounds: Normal heart sounds.  Pulmonary:     Effort: Pulmonary effort is normal.     Breath sounds: Normal breath sounds.  Abdominal:     General: Bowel sounds are normal.     Palpations: Abdomen is soft.  Musculoskeletal:        General: No tenderness or deformity. Normal range of motion.     Cervical back: Normal range of motion.  Lymphadenopathy:     Cervical: No cervical adenopathy.  Skin:    General: Skin is warm and dry.     Findings: No erythema or rash.  Neurological:     Mental Status: He is alert and oriented to person, place, and time.  Psychiatric:        Behavior: Behavior normal.        Thought Content:  Thought content normal.        Judgment: Judgment normal.      Lab Results  Component Value Date   WBC 7.5 11/10/2019   HGB 12.0 (L) 11/10/2019   HCT 37.5 (L) 11/10/2019   MCV 86.8 11/10/2019   PLT 296 11/10/2019     Chemistry      Component Value Date/Time   NA 137 11/10/2019 0855   K 3.6 11/10/2019 0855   CL 101 11/10/2019 0855   CO2 27 11/10/2019 0855   BUN 10 11/10/2019 0855   CREATININE 0.96 11/10/2019 0855   CREATININE 1.15 08/25/2019 1450      Component Value Date/Time   CALCIUM 9.3 11/10/2019 0855   ALKPHOS 129 (H) 11/10/2019 0855   AST 11 (L) 11/10/2019 0855   ALT 13 11/10/2019 0855   BILITOT 0.4 11/10/2019 0855      Impression and Plan: Vincent David is a very nice 55 year old white male.  He has metastatic renal cell carcinoma.  For some reason, his disease he clearly favors his bones.  His lungs and liver and lymph nodes all appear to be clean.  I am happy that he is doing well.  His quality life is doing better.  I am glad the pain is doing better.  This will be his third cycle of the nivolumab/ipilimumab.  After his fourth cycle, we will then repeat his scans.  We will plan to get him back in 3 more weeks.    10/5/20219:54 AM

## 2019-11-10 NOTE — Patient Instructions (Signed)
[facility] Discharge Instructions for Patients Receiving Chemotherapy  Today you received the following chemotherapy agents Yervoy and Opdivo  To help prevent nausea and vomiting after your treatment, we encourage you to take your nausea medication    If you develop nausea and vomiting that is not controlled by your nausea medication, call the clinic.   BELOW ARE SYMPTOMS THAT SHOULD BE REPORTED IMMEDIATELY:  *FEVER GREATER THAN 100.5 F  *CHILLS WITH OR WITHOUT FEVER  NAUSEA AND VOMITING THAT IS NOT CONTROLLED WITH YOUR NAUSEA MEDICATION  *UNUSUAL SHORTNESS OF BREATH  *UNUSUAL BRUISING OR BLEEDING  TENDERNESS IN MOUTH AND THROAT WITH OR WITHOUT PRESENCE OF ULCERS  *URINARY PROBLEMS  *BOWEL PROBLEMS  UNUSUAL RASH Items with * indicate a potential emergency and should be followed up as soon as possible.  Feel free to call the clinic should you have any questions or concerns. The clinic phone number is [phone].  Please show the [facility] at check-in to the Emergency Department and triage nurse.

## 2019-11-10 NOTE — Progress Notes (Signed)
Oncology Nurse Navigator Documentation  Oncology Nurse Navigator Flowsheets 11/10/2019  Abnormal Finding Date -  Confirmed Diagnosis Date -  Diagnosis Status -  Planned Course of Treatment -  Phase of Treatment -  Chemotherapy Pending- Reason: -  Chemotherapy Actual Start Date: -  Navigator Follow Up Date: 12/01/2019  Navigator Follow Up Reason: Follow-up Appointment;Chemotherapy  Navigator Location [facility]  Referral Date to RadOnc/MedOnc -  Navigator Encounter Type Treatment;Appt/Treatment Plan Review  Telephone -  Treatment Initiated Date -  Patient Visit Type MedOnc  Treatment Phase Active Tx  Barriers/Navigation Needs Coordination of Care;Education;Family Concerns  Education -  Interventions Psycho-Social Support  Acuity Level 2-Minimal Needs (1-2 Barriers Identified)  Referrals -  Coordination of Care -  Education Method -  Support Groups/Services Friends and Family  Time Spent with Patient 30

## 2019-11-10 NOTE — Patient Instructions (Signed)

## 2019-11-11 LAB — T4: T4, Total: 5.7 ug/dL (ref 4.5–12.0)

## 2019-11-19 ENCOUNTER — Encounter: Payer: Self-pay | Admitting: Hematology & Oncology

## 2019-11-19 ENCOUNTER — Other Ambulatory Visit: Payer: Self-pay | Admitting: *Deleted

## 2019-11-19 DIAGNOSIS — G4733 Obstructive sleep apnea (adult) (pediatric): Secondary | ICD-10-CM | POA: Diagnosis not present

## 2019-11-19 MED ORDER — OXYCODONE HCL 10 MG PO TABS
10.0000 mg | ORAL_TABLET | ORAL | 0 refills | Status: DC | PRN
Start: 2019-11-19 — End: 2020-01-27

## 2019-11-19 MED ORDER — OXYCODONE HCL ER 15 MG PO T12A
15.0000 mg | EXTENDED_RELEASE_TABLET | Freq: Two times a day (BID) | ORAL | 0 refills | Status: DC
Start: 2019-11-19 — End: 2020-04-20

## 2019-11-30 ENCOUNTER — Ambulatory Visit
Admission: RE | Admit: 2019-11-30 | Discharge: 2019-11-30 | Disposition: A | Discharge status: Home / Self Care | Payer: BC Managed Care – PPO | Source: Ambulatory Visit | Attending: Radiation Oncology | Admitting: Radiation Oncology

## 2019-11-30 ENCOUNTER — Encounter: Payer: Self-pay | Admitting: Radiation Oncology

## 2019-11-30 ENCOUNTER — Other Ambulatory Visit: Payer: Self-pay

## 2019-11-30 DIAGNOSIS — C7951 Secondary malignant neoplasm of bone: Secondary | ICD-10-CM | POA: Diagnosis not present

## 2019-11-30 DIAGNOSIS — Z923 Personal history of irradiation: Secondary | ICD-10-CM | POA: Insufficient documentation

## 2019-11-30 DIAGNOSIS — Z79899 Other long term (current) drug therapy: Secondary | ICD-10-CM | POA: Diagnosis not present

## 2019-11-30 DIAGNOSIS — C642 Malignant neoplasm of left kidney, except renal pelvis: Secondary | ICD-10-CM | POA: Diagnosis not present

## 2019-11-30 DIAGNOSIS — Z7982 Long term (current) use of aspirin: Secondary | ICD-10-CM | POA: Insufficient documentation

## 2019-11-30 DIAGNOSIS — M25551 Pain in right hip: Secondary | ICD-10-CM | POA: Insufficient documentation

## 2019-11-30 DIAGNOSIS — M545 Low back pain, unspecified: Secondary | ICD-10-CM | POA: Diagnosis not present

## 2019-11-30 NOTE — Progress Notes (Signed)
Radiation Oncology         [phone] ________________________________  Name: Vincent David MRN: [id number]  Date: 11/30/2019  DOB: 10-18-1965  Follow-Up Visit Note  CC: Emeterio Reeve, DO  Volanda Napoleon, MD    ICD-10-CM   1. Kidney cancer, primary, with metastasis from kidney to other site, left [facility])  C64.2     Diagnosis: Metastatic clear cell renal cell carcinoma  Interval Since Last Radiation: One month and one day  Radiation Treatment Dates: 10/13/2019 through 10/30/2019 Site Technique Total Dose (Gy) Dose per Fx (Gy) Completed Fx Beam Energies  Ilium, Right: Pelvis_Rt 3D 35/35 2.5 14/14 6X, 15X  Femur Left: Ext_Lt Complex 30/30 3 10/10 10X    Narrative:  The patient returns today for routine follow-up. Since the end of treatment, the patient was seen by Dr. Marin Olp on 11/10/2019. He continues on chemotherapy with Nivoumab/Ipilimumab/Xgeva. Repeat scans will be done after his fourth cycle.  On review of systems, he reports ongoing pain in his right hip and right lower back region.  He is taking less pain medication at this time he is not ambulating with the assistance of a cane or crutches. He denies weakness in his right lower extremity.  He denies any pain in his left leg.  ALLERGIES:  has No Known Allergies.  Meds: Current Outpatient Medications  Medication Sig Dispense Refill  . aspirin EC 81 MG tablet Take 1 tablet (81 mg total) by mouth 2 (two) times daily. 60 tablet 0  . lamoTRIgine (LAMICTAL) 100 MG tablet Take 1 tablet (100 mg total) by mouth 2 (two) times daily. 180 tablet 1  . nicotine (NICODERM CQ - DOSED IN MG/24 HR) 7 mg/24hr patch Place 1 patch (7 mg total) onto the skin daily. 28 patch 0  . oxyCODONE (OXYCONTIN) 15 mg 12 hr tablet Take 1 tablet (15 mg total) by mouth every 12 (twelve) hours. 60 tablet 0  . Oxycodone HCl 10 MG TABS Take 1 tablet (10 mg total) by mouth every 4 (four) hours as needed. 90 tablet 0   No current facility-administered  medications for this encounter.    Physical Findings: The patient is in no acute distress. Patient is alert and oriented.  height is 5\' 7"  (1.702 m) and weight is 201 lb 3.2 oz (91.3 kg). His temperature is 97.7 F (36.5 C). His blood pressure is 157/90 (abnormal) and his pulse is 100. His respiration is 18 and oxygen saturation is 98%.   Lungs are clear to auscultation bilaterally. Heart has regular rate and rhythm. No palpable cervical, supraclavicular, or axillary adenopathy. Abdomen soft, non-tender, normal bowel sounds.  Lower motor strength is 5 out of 5 in the proximal and distal muscle groups laterally.   Lab Findings: Lab Results  Component Value Date   WBC 7.5 11/10/2019   HGB 12.0 (L) 11/10/2019   HCT 37.5 (L) 11/10/2019   MCV 86.8 11/10/2019   PLT 296 11/10/2019    Radiographic Findings: No results found.  Impression: Metastatic clear cell renal cell carcinoma  Patient had some improvement in his pain in the right pelvis area but with ambulation or standing he developed significant pain.  The patient does report he does not feel the mass in the right pelvis which he could prior to radiation therapy.  Plan: The patient is scheduled to follow up with Dr. Marin Olp tomorrow. Repeat scans will be ordered by Dr. Marin Olp in the near future.  As needed follow-up in radiation oncology in light of his  close follow-up in medical oncology. ____________________________________   Blair Promise, PhD, MD  This document serves as a record of services personally performed by Gery Pray, MD. It was created on his behalf by Clerance Lav, a trained medical scribe. The creation of this record is based on the scribe's personal observations and the provider's statements to them. This document has been checked and approved by the attending provider.

## 2019-11-30 NOTE — Progress Notes (Signed)
Patient here for a 1 month f/u with Dr. Sondra Come. He reports lower back and right hip pain when walking/standing is a 7 out of 10.  BP (!) 157/90 (BP Location: Left Arm, Patient Position: Sitting, Cuff Size: Normal)   Pulse 100   Temp 97.7 F (36.5 C)   Resp 18   Ht 5\' 7"  (1.702 m)   Wt 201 lb 3.2 oz (91.3 kg)   SpO2 98%   BMI 31.51 kg/m   Wt Readings from Last 3 Encounters:  11/30/19 201 lb 3.2 oz (91.3 kg)  11/10/19 206 lb 12 oz (93.8 kg)  10/20/19 210 lb 8 oz (95.5 kg)      Patient is supposed to finish chemo this week.

## 2019-11-30 NOTE — Progress Notes (Incomplete)
  Patient Name: Vincent David MRN: [id number] DOB: February 15, 1965 Referring Physician: Burney Gauze (Profile Not Attached) Date of Service: 10/30/2019 [facility], [place]                                                        End Of Treatment Note  Diagnoses: C79.51-Secondary malignant neoplasm of bone  Cancer Staging: Metastatic clear cell renal cell carcinoma  Intent: Palliative  Radiation Treatment Dates: 10/13/2019 through 10/30/2019 Site Technique Total Dose (Gy) Dose per Fx (Gy) Completed Fx Beam Energies  Ilium, Right: Pelvis_Rt 3D 35/35 2.5 14/14 6X, 15X  Femur Left: Ext_Lt Complex 30/30 3 10/10 10X   Narrative: The patient tolerated radiation therapy relatively well. He did report some pelvic pain relief throughout treatment. He also reported a 20 lb weight loss since July and was drinking protein shakes when he did not feel like eating. He denied dysuria, hematuria, and fatigue.  Plan: The patient will follow-up with radiation oncology in one month.  ________________________________________________   Blair Promise, PhD, MD  This document serves as a record of services personally performed by Gery Pray, MD. It was created on his behalf by Clerance Lav, a trained medical scribe. The creation of this record is based on the scribe's personal observations and the provider's statements to them. This document has been checked and approved by the attending provider.

## 2019-12-01 ENCOUNTER — Inpatient Hospital Stay: Payer: BC Managed Care – PPO

## 2019-12-01 ENCOUNTER — Encounter: Payer: Self-pay | Admitting: Hematology & Oncology

## 2019-12-01 ENCOUNTER — Other Ambulatory Visit: Payer: Self-pay

## 2019-12-01 ENCOUNTER — Encounter: Payer: Self-pay | Admitting: *Deleted

## 2019-12-01 ENCOUNTER — Inpatient Hospital Stay (HOSPITAL_BASED_OUTPATIENT_CLINIC_OR_DEPARTMENT_OTHER): Payer: BC Managed Care – PPO | Admitting: Hematology & Oncology

## 2019-12-01 VITALS — BP 144/75 | HR 84

## 2019-12-01 VITALS — BP 188/85 | HR 108 | Temp 98.1°F | Resp 18 | Wt 201.5 lb

## 2019-12-01 DIAGNOSIS — S72001G Fracture of unspecified part of neck of right femur, subsequent encounter for closed fracture with delayed healing: Secondary | ICD-10-CM | POA: Diagnosis not present

## 2019-12-01 DIAGNOSIS — C642 Malignant neoplasm of left kidney, except renal pelvis: Secondary | ICD-10-CM

## 2019-12-01 DIAGNOSIS — C7951 Secondary malignant neoplasm of bone: Secondary | ICD-10-CM | POA: Diagnosis not present

## 2019-12-01 DIAGNOSIS — Z923 Personal history of irradiation: Secondary | ICD-10-CM | POA: Diagnosis not present

## 2019-12-01 DIAGNOSIS — Z7982 Long term (current) use of aspirin: Secondary | ICD-10-CM | POA: Diagnosis not present

## 2019-12-01 DIAGNOSIS — Z5112 Encounter for antineoplastic immunotherapy: Secondary | ICD-10-CM | POA: Diagnosis not present

## 2019-12-01 DIAGNOSIS — Z79899 Other long term (current) drug therapy: Secondary | ICD-10-CM | POA: Diagnosis not present

## 2019-12-01 LAB — CMP (CANCER CENTER ONLY)
ALT: 15 U/L (ref 0–44)
AST: 15 U/L (ref 15–41)
Albumin: 4 g/dL (ref 3.5–5.0)
Alkaline Phosphatase: 137 U/L — ABNORMAL HIGH (ref 38–126)
Anion gap: 8 (ref 5–15)
BUN: 11 mg/dL (ref 6–20)
CO2: 28 mmol/L (ref 22–32)
Calcium: 9.4 mg/dL (ref 8.9–10.3)
Chloride: 100 mmol/L (ref 98–111)
Creatinine: 0.91 mg/dL (ref 0.61–1.24)
GFR, Estimated: >60 mL/min (ref >60)
Glucose, Bld: 202 mg/dL — ABNORMAL HIGH (ref 70–99)
Potassium: 3.7 mmol/L (ref 3.5–5.1)
Sodium: 136 mmol/L (ref 135–145)
Total Bilirubin: 0.3 mg/dL (ref 0.3–1.2)
Total Protein: 6.9 g/dL (ref 6.5–8.1)

## 2019-12-01 LAB — CBC WITH DIFFERENTIAL (CANCER CENTER ONLY)
Abs Immature Granulocytes: 0.04 10*3/uL (ref 0.00–0.07)
Basophils Absolute: 0 10*3/uL (ref 0.0–0.1)
Basophils Relative: 0 %
Eosinophils Absolute: 0 10*3/uL (ref 0.0–0.5)
Eosinophils Relative: 0 %
HCT: 40.4 % (ref 39.0–52.0)
Hemoglobin: 12.7 g/dL — ABNORMAL LOW (ref 13.0–17.0)
Immature Granulocytes: 1 %
Lymphocytes Relative: 8 %
Lymphs Abs: 0.6 10*3/uL — ABNORMAL LOW (ref 0.7–4.0)
MCH: 27 pg (ref 26.0–34.0)
MCHC: 31.4 g/dL (ref 30.0–36.0)
MCV: 86 fL (ref 80.0–100.0)
Monocytes Absolute: 0.5 10*3/uL (ref 0.1–1.0)
Monocytes Relative: 7 %
Neutro Abs: 6.5 10*3/uL (ref 1.7–7.7)
Neutrophils Relative %: 84 %
Platelet Count: 379 10*3/uL (ref 150–400)
RBC: 4.7 MIL/uL (ref 4.22–5.81)
RDW: 17.2 % — ABNORMAL HIGH (ref 11.5–15.5)
WBC Count: 7.6 10*3/uL (ref 4.0–10.5)
nRBC: 0 % (ref 0.0–0.2)

## 2019-12-01 LAB — TSH: TSH: 1.916 u[IU]/mL (ref 0.320–4.118)

## 2019-12-01 LAB — LACTATE DEHYDROGENASE: LDH: 171 U/L (ref 98–192)

## 2019-12-01 MED ORDER — HEPARIN SOD (PORK) LOCK FLUSH 100 UNIT/ML IV SOLN
500.0000 [IU] | Freq: Once | INTRAVENOUS | Status: AC | PRN
  Administered 2019-12-01: 500 [IU]
  Filled 2019-12-01: qty 5

## 2019-12-01 MED ORDER — SODIUM CHLORIDE 0.9% FLUSH
10.0000 mL | INTRAVENOUS | Status: DC | PRN
  Administered 2019-12-01: 10 mL
  Filled 2019-12-01: qty 10

## 2019-12-01 MED ORDER — DIPHENHYDRAMINE HCL 50 MG/ML IJ SOLN
25.0000 mg | Freq: Once | INTRAMUSCULAR | Status: AC
  Administered 2019-12-01: 25 mg via INTRAVENOUS

## 2019-12-01 MED ORDER — SODIUM CHLORIDE 0.9 % IV SOLN
1.0000 mg/kg | Freq: Once | INTRAVENOUS | Status: AC
  Administered 2019-12-01: 100 mg via INTRAVENOUS
  Filled 2019-12-01: qty 20

## 2019-12-01 MED ORDER — DIPHENHYDRAMINE HCL 50 MG/ML IJ SOLN
INTRAMUSCULAR | Status: AC
  Filled 2019-12-01: qty 1

## 2019-12-01 MED ORDER — FAMOTIDINE IN NACL 20-0.9 MG/50ML-% IV SOLN
INTRAVENOUS | Status: AC
  Filled 2019-12-01: qty 50

## 2019-12-01 MED ORDER — SODIUM CHLORIDE 0.9 % IV SOLN
2.9800 mg/kg | Freq: Once | INTRAVENOUS | Status: AC
  Administered 2019-12-01: 300 mg via INTRAVENOUS
  Filled 2019-12-01: qty 30

## 2019-12-01 MED ORDER — SODIUM CHLORIDE 0.9 % IV SOLN
Freq: Once | INTRAVENOUS | Status: AC
  Filled 2019-12-01: qty 250

## 2019-12-01 MED ORDER — FAMOTIDINE IN NACL 20-0.9 MG/50ML-% IV SOLN
20.0000 mg | Freq: Once | INTRAVENOUS | Status: AC
  Administered 2019-12-01: 20 mg via INTRAVENOUS

## 2019-12-01 NOTE — Progress Notes (Signed)
Patient needs PET and MRI scheduled. Both scheduled for 12/14/2019. Appointment time, date and location given to patient. Radiology information sheet given to, and reviewed with patient. This includes all necessary prep information for scans.   Oncology Nurse Navigator Documentation  Oncology Nurse Navigator Flowsheets 12/01/2019  Abnormal Finding Date -  Confirmed Diagnosis Date -  Diagnosis Status -  Planned Course of Treatment -  Phase of Treatment -  Chemotherapy Pending- Reason: -  Chemotherapy Actual Start Date: -  Navigator Follow Up Date: 12/14/2019  Navigator Follow Up Reason: Scan Review  Navigator Location [facility]  Referral Date to RadOnc/MedOnc -  Navigator Encounter Type Treatment  Telephone -  Treatment Initiated Date -  Patient Visit Type MedOnc  Treatment Phase Active Tx  Barriers/Navigation Needs Coordination of Care;Education;Family Concerns  Education Other  Interventions Coordination of Care;Education;Psycho-Social Support  Acuity Level 2-Minimal Needs (1-2 Barriers Identified)  Referrals -  Coordination of Care Appts;Radiology  Education Method Verbal;Written  Support Groups/Services Friends and Family  Time Spent with Patient 54

## 2019-12-01 NOTE — Progress Notes (Signed)
Patient ambulatory and stable upon discharge

## 2019-12-01 NOTE — Patient Instructions (Addendum)
For any signs and symptoms of diarrhea: Immediately start 80 mg (8 tablets) by mouth daily for 3 days  Reduce by 10 mg for 3 days thereafter:  70 mg (7 tablets) by mouth daily for 3 days  60 mg (6 tablets) by mouth daily for 3 days  50 mg (5 tablets) by mouth daily for 3 days  40 mg (4 tablets) by mouth daily for 3 days  30 mg (3 tablets) by mouth daily for 3 days  20 mg (2 tablets) by mouth daily for 3 days  10 mg (1 tablet) by mouth daily for 3 days 5 mg (1/2 tablet) by mouth daily for 3 days.Nivolumab injection What is this medicine? NIVOLUMAB (nye VOL ue mab) is a monoclonal antibody. It is used to treat colon cancer, esophageal cancer, head and neck cancer, Hodgkin lymphoma, kidney cancer, liver cancer, lung cancer, mesothelioma, melanoma, and urothelial cancer. This medicine may be used for other purposes; ask your health care provider or pharmacist if you have questions. COMMON BRAND NAME(S): Opdivo What should I tell my health care provider before I take this medicine? They need to know if you have any of these conditions:  diabetes  immune system problems  kidney disease  liver disease  lung disease  organ transplant  stomach or intestine problems  thyroid disease  an unusual or allergic reaction to nivolumab, other medicines, foods, dyes, or preservatives  pregnant or trying to get pregnant  breast-feeding How should I use this medicine? This medicine is for infusion into a vein. It is given by a health care professional in a hospital or clinic setting. A special MedGuide will be given to you before each treatment. Be sure to read this information carefully each time. Talk to your pediatrician regarding the use of this medicine in children. While this drug may be prescribed for children as young as 12 years for selected conditions, precautions do apply. Overdosage: If you think you have taken too much of this medicine contact a poison control center or  emergency room at once. NOTE: This medicine is only for you. Do not share this medicine with others. What if I miss a dose? It is important not to miss your dose. Call your doctor or health care professional if you are unable to keep an appointment. What may interact with this medicine? Interactions have not been studied. Give your health care provider a list of all the medicines, herbs, non-prescription drugs, or dietary supplements you use. Also tell them if you smoke, drink alcohol, or use illegal drugs. Some items may interact with your medicine. This list may not describe all possible interactions. Give your health care provider a list of all the medicines, herbs, non-prescription drugs, or dietary supplements you use. Also tell them if you smoke, drink alcohol, or use illegal drugs. Some items may interact with your medicine. What should I watch for while using this medicine? This drug may make you feel generally unwell. Continue your course of treatment even though you feel ill unless your doctor tells you to stop. You may need blood work done while you are taking this medicine. Do not become pregnant while taking this medicine or for 5 months after stopping it. Women should inform their doctor if they wish to become pregnant or think they might be pregnant. There is a potential for serious side effects to an unborn child. Talk to your health care professional or pharmacist for more information. Do not breast-feed an infant while taking this  medicine or for 5 months after stopping it. What side effects may I notice from receiving this medicine? Side effects that you should report to your doctor or health care professional as soon as possible:  allergic reactions like skin rash, itching or hives, swelling of the face, lips, or tongue  breathing problems  blood in the urine  bloody or watery diarrhea or black, tarry stools  changes in emotions or moods  changes in vision  chest  pain  cough  dizziness  feeling faint or lightheaded, falls  fever, chills  headache with fever, neck stiffness, confusion, loss of memory, sensitivity to light, hallucination, loss of contact with reality, or seizures  joint pain  mouth sores  redness, blistering, peeling or loosening of the skin, including inside the mouth  severe muscle pain or weakness  signs and symptoms of high blood sugar such as dizziness; dry mouth; dry skin; fruity breath; nausea; stomach pain; increased hunger or thirst; increased urination  signs and symptoms of kidney injury like trouble passing urine or change in the amount of urine  signs and symptoms of liver injury like dark yellow or brown urine; general ill feeling or flu-like symptoms; light-colored stools; loss of appetite; nausea; right upper belly pain; unusually weak or tired; yellowing of the eyes or skin  swelling of the ankles, feet, hands  trouble passing urine or change in the amount of urine  unusually weak or tired  weight gain or loss Side effects that usually do not require medical attention (report to your doctor or health care professional if they continue or are bothersome):  bone pain  constipation  decreased appetite  diarrhea  muscle pain  nausea, vomiting  tiredness This list may not describe all possible side effects. Call your doctor for medical advice about side effects. You may report side effects to FDA at 1-800-FDA-1088. Where should I keep my medicine? This drug is given in a hospital or clinic and will not be stored at home. NOTE: This sheet is a summary. It may not cover all possible information. If you have questions about this medicine, talk to your doctor, pharmacist, or health care provider.  2020 Elsevier/Gold Standard (2018-11-11 10:04:50) Ipilimumab injection What is this medicine? IPILIMUMAB (IP i LIM ue mab) is a monoclonal antibody. It is used to treat colorectal cancer, kidney cancer,  liver cancer, lung cancer, melanoma, and mesothelioma. This medicine may be used for other purposes; ask your health care provider or pharmacist if you have questions. COMMON BRAND NAME(S): YERVOY What should I tell my health care provider before I take this medicine? They need to know if you have any of these conditions:  Addison's disease  blood in your stools (black or tarry stools) or if you have blood in your vomit  eye disease, vision problems  history of pancreatitis  history of stomach bleeding  immune system problems  inflammatory bowel disease  kidney disease  liver disease  lupus  myasthenia gravis  organ transplant  rheumatoid arthritis  sarcoidosis  stomach or intestine problems  thyroid disease  tingling of the fingers or toes, or other nerve disorder  an unusual or allergic reaction to ipilimumab, other medicines, foods, dyes, or preservatives  pregnant or trying to get pregnant  breast-feeding How should I use this medicine? This medicine is for infusion into a vein. It is given by a health care professional in a hospital or clinic setting. A special MedGuide will be given to you before each treatment. Be  sure to read this information carefully each time. Talk to your pediatrician regarding the use of this medicine in children. While this drug may be prescribed for children as young as 12 years for selected conditions, precautions do apply. Overdosage: If you think you have taken too much of this medicine contact a poison control center or emergency room at once. NOTE: This medicine is only for you. Do not share this medicine with others. What if I miss a dose? It is important not to miss your dose. Call your doctor or health care professional if you are unable to keep an appointment. What may interact with this medicine? Interactions are not expected. This list may not describe all possible interactions. Give your health care provider a list of  all the medicines, herbs, non-prescription drugs, or dietary supplements you use. Also tell them if you smoke, drink alcohol, or use illegal drugs. Some items may interact with your medicine. What should I watch for while using this medicine? Tell your doctor or healthcare professional if your symptoms do not start to get better or if they get worse. Do not become pregnant while taking this medicine or for 3 months after stopping it. Women should inform their doctor if they wish to become pregnant or think they might be pregnant. There is a potential for serious side effects to an unborn child. Talk to your health care professional or pharmacist for more information. Do not breast-feed an infant while taking this medicine or for 3 months after the last dose. Your condition will be monitored carefully while you are receiving this medicine. You may need blood work done while you are taking this medicine. What side effects may I notice from receiving this medicine? Side effects that you should report to your doctor or health care professional as soon as possible:  allergic reactions like skin rash, itching or hives, swelling of the face, lips, or tongue  black, tarry stools  bloody or watery diarrhea  changes in vision  dizziness  eye pain  fast, irregular heartbeat  feeling anxious  feeling faint or lightheaded, falls  nausea, vomiting  pain, tingling, numbness in the hands or feet  redness, blistering, peeling or loosening of the skin, including inside the mouth  signs and symptoms of liver injury like dark yellow or brown urine; general ill feeling or flu-like symptoms; light-colored stools; loss of appetite; nausea; right upper belly pain; unusually weak or tired; yellowing of the eyes or skin  unusual bleeding or bruising Side effects that usually do not require medical attention (report to your doctor or health care professional if they continue or are  bothersome):  headache  loss of appetite  trouble sleeping This list may not describe all possible side effects. Call your doctor for medical advice about side effects. You may report side effects to FDA at 1-800-FDA-1088. Where should I keep my medicine? This drug is given in a hospital or clinic and will not be stored at home. NOTE: This sheet is a summary. It may not cover all possible information. If you have questions about this medicine, talk to your doctor, pharmacist, or health care provider.  2020 Elsevier/Gold Standard (2018-11-11 10:56:55)

## 2019-12-01 NOTE — Progress Notes (Signed)
Hematology and Oncology Follow Up Visit  [name] [id number] Jun 22, 1965 54 y.o. 12/01/2019   Principle Diagnosis:   Metastatic Renal Cell Carcinoma - bone mets  Current Therapy:    Nivoumab/Ipilimumab -- s/p cycle #3 -- started on 09/29/2019  Xgeva 120 mg sq q 3 months -- next dose in 12/2019  XRT to right iliac mass/ L5 vertebral body/ Left femoral neck  LEFT hip repair     Interim History:  Mr. Vincent David is back for follow-up. He is doing quite well.  He has tolerated treatment quite nicely.  However, he is starting to have more pain over on the right side.  I am unsure if this is from the fracture in his L5 vertebral body.  This may be the source.  We we will go ahead and set him up with follow-up scans with the MRI and the PET scan in a couple weeks.  He has had no problems with leg swelling.  He has had no diarrhea.  He has had no cough or shortness of breath.  He has had no headache.  He is trying to be conservative with his pain medication.  Overall, I was his performance status is ECOG 1.she is  Medications:  Current Outpatient Medications:  .  aspirin EC 81 MG tablet, Take 1 tablet (81 mg total) by mouth 2 (two) times daily., Disp: 60 tablet, Rfl: 0 .  lamoTRIgine (LAMICTAL) 100 MG tablet, Take 1 tablet (100 mg total) by mouth 2 (two) times daily., Disp: 180 tablet, Rfl: 1 .  nicotine (NICODERM CQ - DOSED IN MG/24 HR) 7 mg/24hr patch, Place 1 patch (7 mg total) onto the skin daily., Disp: 28 patch, Rfl: 0 .  oxyCODONE (OXYCONTIN) 15 mg 12 hr tablet, Take 1 tablet (15 mg total) by mouth every 12 (twelve) hours., Disp: 60 tablet, Rfl: 0 .  Oxycodone HCl 10 MG TABS, Take 1 tablet (10 mg total) by mouth every 4 (four) hours as needed., Disp: 90 tablet, Rfl: 0  Allergies: No Known Allergies  Past Medical History, Surgical history, Social history, and Family History were reviewed and updated.  Review of Systems: Review of Systems  Constitutional: Negative.   HENT:   Negative.   Eyes: Negative.   Respiratory: Negative.   Cardiovascular: Negative.   Gastrointestinal: Negative.   Endocrine: Negative.   Musculoskeletal: Positive for back pain and flank pain.  Skin: Negative.   Neurological: Negative.   Hematological: Negative.   Psychiatric/Behavioral: Negative.     Physical Exam:  weight is 201 lb 8 oz (91.4 kg). His oral temperature is 98.1 F (36.7 C). His blood pressure is 188/85 (abnormal) and his pulse is 108 (abnormal). His respiration is 18 and oxygen saturation is 99%.   Wt Readings from Last 3 Encounters:  12/01/19 201 lb 8 oz (91.4 kg)  12/01/19 201 lb (91.2 kg)  11/30/19 201 lb 3.2 oz (91.3 kg)    Physical Exam Vitals reviewed.  HENT:     Head: Normocephalic and atraumatic.  Eyes:     Pupils: Pupils are equal, round, and reactive to light.  Cardiovascular:     Rate and Rhythm: Normal rate and regular rhythm.     Heart sounds: Normal heart sounds.  Pulmonary:     Effort: Pulmonary effort is normal.     Breath sounds: Normal breath sounds.  Abdominal:     General: Bowel sounds are normal.     Palpations: Abdomen is soft.  Musculoskeletal:  General: No tenderness or deformity. Normal range of motion.     Cervical back: Normal range of motion.  Lymphadenopathy:     Cervical: No cervical adenopathy.  Skin:    General: Skin is warm and dry.     Findings: No erythema or rash.  Neurological:     Mental Status: He is alert and oriented to person, place, and time.  Psychiatric:        Behavior: Behavior normal.        Thought Content: Thought content normal.        Judgment: Judgment normal.      Lab Results  Component Value Date   WBC 7.6 12/01/2019   HGB 12.7 (L) 12/01/2019   HCT 40.4 12/01/2019   MCV 86.0 12/01/2019   PLT 379 12/01/2019     Chemistry      Component Value Date/Time   NA 137 11/10/2019 0855   K 3.6 11/10/2019 0855   CL 101 11/10/2019 0855   CO2 27 11/10/2019 0855   BUN 10 11/10/2019  0855   CREATININE 0.96 11/10/2019 0855   CREATININE 1.15 08/25/2019 1450      Component Value Date/Time   CALCIUM 9.3 11/10/2019 0855   ALKPHOS 129 (H) 11/10/2019 0855   AST 11 (L) 11/10/2019 0855   ALT 13 11/10/2019 0855   BILITOT 0.4 11/10/2019 0855      Impression and Plan: Mr. Vincent David is a very nice 54 year old white male.  He has metastatic renal cell carcinoma.  For some reason, his disease he clearly favors his bones.  His lungs and liver and lymph nodes all appear to be clean.  I really hope that we are not seeing any problems with progressive disease.  His labs look pretty good.  The alkaline phosphatase is come down.  Again, the scans will certainly tell us what is going on.  I do not know if we may have to consider kyphoplasty if he does have problems at the L5 vertebral body.  We will go ahead with his fourth cycle of treatment today.  I will then plan to get him back to see [place] in about 3 weeks.  At that point, if everything looks good, then we will move ahead with just nivolumab given monthly.   10/26/202110:24 AM

## 2019-12-02 ENCOUNTER — Telehealth: Payer: Self-pay | Admitting: Hematology & Oncology

## 2019-12-02 LAB — T4: T4, Total: 5.2 ug/dL (ref 4.5–12.0)

## 2019-12-02 NOTE — Telephone Encounter (Signed)
NO los 10/26

## 2019-12-14 ENCOUNTER — Other Ambulatory Visit: Payer: Self-pay

## 2019-12-14 ENCOUNTER — Ambulatory Visit (HOSPITAL_COMMUNITY)
Admission: RE | Admit: 2019-12-14 | Discharge: 2019-12-14 | Disposition: A | Discharge status: Home / Self Care | Payer: BC Managed Care – PPO | Source: Ambulatory Visit | Attending: Hematology & Oncology | Admitting: Hematology & Oncology

## 2019-12-14 DIAGNOSIS — C642 Malignant neoplasm of left kidney, except renal pelvis: Secondary | ICD-10-CM | POA: Diagnosis not present

## 2019-12-14 DIAGNOSIS — C641 Malignant neoplasm of right kidney, except renal pelvis: Secondary | ICD-10-CM | POA: Diagnosis not present

## 2019-12-14 DIAGNOSIS — D492 Neoplasm of unspecified behavior of bone, soft tissue, and skin: Secondary | ICD-10-CM | POA: Diagnosis not present

## 2019-12-14 DIAGNOSIS — J432 Centrilobular emphysema: Secondary | ICD-10-CM | POA: Diagnosis not present

## 2019-12-14 DIAGNOSIS — C7951 Secondary malignant neoplasm of bone: Secondary | ICD-10-CM | POA: Diagnosis not present

## 2019-12-14 DIAGNOSIS — M25551 Pain in right hip: Secondary | ICD-10-CM | POA: Diagnosis not present

## 2019-12-14 DIAGNOSIS — S72001G Fracture of unspecified part of neck of right femur, subsequent encounter for closed fracture with delayed healing: Secondary | ICD-10-CM | POA: Insufficient documentation

## 2019-12-14 DIAGNOSIS — N281 Cyst of kidney, acquired: Secondary | ICD-10-CM | POA: Diagnosis not present

## 2019-12-14 LAB — GLUCOSE, CAPILLARY: Glucose-Capillary: 108 mg/dL — ABNORMAL HIGH (ref 70–99)

## 2019-12-14 MED ORDER — FLUDEOXYGLUCOSE F - 18 (FDG) INJECTION
9.9000 | Freq: Once | INTRAVENOUS | Status: AC | PRN
  Administered 2019-12-14: 9.9 via INTRAVENOUS

## 2019-12-14 MED ORDER — GADOBUTROL 1 MMOL/ML IV SOLN
9.0000 mL | Freq: Once | INTRAVENOUS | Status: AC | PRN
  Administered 2019-12-14: 9 mL via INTRAVENOUS

## 2019-12-15 ENCOUNTER — Encounter: Payer: Self-pay | Admitting: *Deleted

## 2019-12-15 NOTE — Progress Notes (Signed)
Following results given to patient:  Volanda Napoleon, MD  P Onc Nurse Hp Call - the tumor on the right hip is stable. Vincent David  Volanda Napoleon, MD  P Onc Nurse Hp Call - the treatment is working!! The primary tumor in the left kidney is about 50% smaller!! The bone lesions are smaller!!! Great job!! Vincent David  Patient is appreciative of the results. We will continue with current treatment plan. No follow up appointments have been scheduled. Message sent to schedulers to schedule for week of November 22nd as the patient is out of town next week.  Oncology Nurse Navigator Documentation  Oncology Nurse Navigator Flowsheets 12/15/2019  Abnormal Finding Date -  Confirmed Diagnosis Date -  Diagnosis Status -  Planned Course of Treatment -  Phase of Treatment -  Chemotherapy Pending- Reason: -  Chemotherapy Actual Start Date: -  Navigator Follow Up Date: 12/29/2019  Navigator Follow Up Reason: Follow-up Appointment;Chemotherapy  Navigator Location [facility]  Referral Date to RadOnc/MedOnc -  Navigator Encounter Type Scan Review;Telephone  Telephone Diagnostic Results;Outgoing Call  Treatment Initiated Date -  Patient Visit Type MedOnc  Treatment Phase Active Tx  Barriers/Navigation Needs Coordination of Care;Education;Family Concerns  Education Other  Interventions Coordination of Care;Education;Psycho-Social Support  Acuity Level 2-Minimal Needs (1-2 Barriers Identified)  Referrals -  Coordination of Care Appts  Education Method Verbal  Support Groups/Services Friends and Family  Time Spent with Patient 39

## 2019-12-24 ENCOUNTER — Telehealth: Payer: Self-pay | Admitting: *Deleted

## 2019-12-24 NOTE — Telephone Encounter (Signed)
Received a call from patients wife Otila Kluver.  They are driving back from a family visit to [place].  They were just notified today that Jeffs sister who he spent Friday, Saturday and Sunday (Nov 12, 13, 14)  with tested postitive for Covid.  5 other family members tested negative.  Patient is asymptomatic.  Next appt with [place] is Tuesday Nov 23.  Advised patient to get COVID tested when they get home from [place] and call us with results.  Wife in agreement.

## 2019-12-28 ENCOUNTER — Telehealth: Payer: Self-pay | Admitting: *Deleted

## 2019-12-28 ENCOUNTER — Other Ambulatory Visit: Payer: Self-pay | Admitting: *Deleted

## 2019-12-28 DIAGNOSIS — C642 Malignant neoplasm of left kidney, except renal pelvis: Secondary | ICD-10-CM

## 2019-12-28 NOTE — Telephone Encounter (Signed)
Received phone call from wife Otila Kluver that Covid results from 12/25/2019 came back negative for Patient and his wife.  They are retesting today to be sure.  Will notify us of results

## 2019-12-29 ENCOUNTER — Inpatient Hospital Stay: Payer: BC Managed Care – PPO

## 2019-12-29 ENCOUNTER — Telehealth: Payer: Self-pay

## 2019-12-29 ENCOUNTER — Encounter: Payer: Self-pay | Admitting: Hematology & Oncology

## 2019-12-29 ENCOUNTER — Inpatient Hospital Stay (HOSPITAL_BASED_OUTPATIENT_CLINIC_OR_DEPARTMENT_OTHER): Payer: BC Managed Care – PPO | Admitting: Hematology & Oncology

## 2019-12-29 ENCOUNTER — Inpatient Hospital Stay: Payer: BC Managed Care – PPO | Attending: Hematology & Oncology

## 2019-12-29 ENCOUNTER — Other Ambulatory Visit: Payer: Self-pay

## 2019-12-29 DIAGNOSIS — Z79899 Other long term (current) drug therapy: Secondary | ICD-10-CM | POA: Diagnosis not present

## 2019-12-29 DIAGNOSIS — Z7982 Long term (current) use of aspirin: Secondary | ICD-10-CM | POA: Insufficient documentation

## 2019-12-29 DIAGNOSIS — C642 Malignant neoplasm of left kidney, except renal pelvis: Secondary | ICD-10-CM

## 2019-12-29 DIAGNOSIS — Z5112 Encounter for antineoplastic immunotherapy: Secondary | ICD-10-CM | POA: Diagnosis not present

## 2019-12-29 DIAGNOSIS — C7951 Secondary malignant neoplasm of bone: Secondary | ICD-10-CM | POA: Insufficient documentation

## 2019-12-29 LAB — CBC WITH DIFFERENTIAL (CANCER CENTER ONLY)
Abs Immature Granulocytes: 0.05 10*3/uL (ref 0.00–0.07)
Basophils Absolute: 0 10*3/uL (ref 0.0–0.1)
Basophils Relative: 0 %
Eosinophils Absolute: 0 10*3/uL (ref 0.0–0.5)
Eosinophils Relative: 0 %
HCT: 41.5 % (ref 39.0–52.0)
Hemoglobin: 13.2 g/dL (ref 13.0–17.0)
Immature Granulocytes: 1 %
Lymphocytes Relative: 11 %
Lymphs Abs: 0.7 10*3/uL (ref 0.7–4.0)
MCH: 27.8 pg (ref 26.0–34.0)
MCHC: 31.8 g/dL (ref 30.0–36.0)
MCV: 87.4 fL (ref 80.0–100.0)
Monocytes Absolute: 0.5 10*3/uL (ref 0.1–1.0)
Monocytes Relative: 7 %
Neutro Abs: 4.9 10*3/uL (ref 1.7–7.7)
Neutrophils Relative %: 81 %
Platelet Count: 295 10*3/uL (ref 150–400)
RBC: 4.75 MIL/uL (ref 4.22–5.81)
RDW: 18.6 % — ABNORMAL HIGH (ref 11.5–15.5)
WBC Count: 6.1 10*3/uL (ref 4.0–10.5)
nRBC: 0 % (ref 0.0–0.2)

## 2019-12-29 LAB — COMPREHENSIVE METABOLIC PANEL
ALT: 12 U/L (ref 0–44)
AST: 13 U/L — ABNORMAL LOW (ref 15–41)
Albumin: 4 g/dL (ref 3.5–5.0)
Alkaline Phosphatase: 117 U/L (ref 38–126)
Anion gap: 6 (ref 5–15)
BUN: 11 mg/dL (ref 6–20)
CO2: 29 mmol/L (ref 22–32)
Calcium: 9.6 mg/dL (ref 8.9–10.3)
Chloride: 105 mmol/L (ref 98–111)
Creatinine, Ser: 1.02 mg/dL (ref 0.61–1.24)
GFR, Estimated: >60 mL/min (ref >60)
Glucose, Bld: 116 mg/dL — ABNORMAL HIGH (ref 70–99)
Potassium: 4 mmol/L (ref 3.5–5.1)
Sodium: 140 mmol/L (ref 135–145)
Total Bilirubin: 0.3 mg/dL (ref 0.3–1.2)
Total Protein: 6.7 g/dL (ref 6.5–8.1)

## 2019-12-29 MED ORDER — SODIUM CHLORIDE 0.9% FLUSH
10.0000 mL | INTRAVENOUS | Status: DC | PRN
  Administered 2019-12-29: 10 mL
  Filled 2019-12-29: qty 10

## 2019-12-29 MED ORDER — HEPARIN SOD (PORK) LOCK FLUSH 100 UNIT/ML IV SOLN
500.0000 [IU] | Freq: Once | INTRAVENOUS | Status: AC | PRN
  Administered 2019-12-29: 500 [IU]
  Filled 2019-12-29: qty 5

## 2019-12-29 MED ORDER — SODIUM CHLORIDE 0.9 % IV SOLN
480.0000 mg | Freq: Once | INTRAVENOUS | Status: AC
  Administered 2019-12-29: 480 mg via INTRAVENOUS
  Filled 2019-12-29: qty 48

## 2019-12-29 MED ORDER — SODIUM CHLORIDE 0.9 % IV SOLN
Freq: Once | INTRAVENOUS | Status: AC
  Filled 2019-12-29: qty 250

## 2019-12-29 NOTE — Patient Instructions (Signed)
Nivolumab injection What is this medicine? NIVOLUMAB (nye VOL ue mab) is a monoclonal antibody. It is used to treat colon cancer, esophageal cancer, head and neck cancer, Hodgkin lymphoma, kidney cancer, liver cancer, lung cancer, mesothelioma, melanoma, and urothelial cancer. This medicine may be used for other purposes; ask your health care provider or pharmacist if you have questions. COMMON BRAND NAME(S): Opdivo What should I tell my health care provider before I take this medicine? They need to know if you have any of these conditions:  diabetes  immune system problems  kidney disease  liver disease  lung disease  organ transplant  stomach or intestine problems  thyroid disease  an unusual or allergic reaction to nivolumab, other medicines, foods, dyes, or preservatives  pregnant or trying to get pregnant  breast-feeding How should I use this medicine? This medicine is for infusion into a vein. It is given by a health care professional in a hospital or clinic setting. A special MedGuide will be given to you before each treatment. Be sure to read this information carefully each time. Talk to your pediatrician regarding the use of this medicine in children. While this drug may be prescribed for children as young as 12 years for selected conditions, precautions do apply. Overdosage: If you think you have taken too much of this medicine contact a poison control center or emergency room at once. NOTE: This medicine is only for you. Do not share this medicine with others. What if I miss a dose? It is important not to miss your dose. Call your doctor or health care professional if you are unable to keep an appointment. What may interact with this medicine? Interactions have not been studied. Give your health care provider a list of all the medicines, herbs, non-prescription drugs, or dietary supplements you use. Also tell them if you smoke, drink alcohol, or use illegal drugs.  Some items may interact with your medicine. This list may not describe all possible interactions. Give your health care provider a list of all the medicines, herbs, non-prescription drugs, or dietary supplements you use. Also tell them if you smoke, drink alcohol, or use illegal drugs. Some items may interact with your medicine. What should I watch for while using this medicine? This drug may make you feel generally unwell. Continue your course of treatment even though you feel ill unless your doctor tells you to stop. You may need blood work done while you are taking this medicine. Do not become pregnant while taking this medicine or for 5 months after stopping it. Women should inform their doctor if they wish to become pregnant or think they might be pregnant. There is a potential for serious side effects to an unborn child. Talk to your health care professional or pharmacist for more information. Do not breast-feed an infant while taking this medicine or for 5 months after stopping it. What side effects may I notice from receiving this medicine? Side effects that you should report to your doctor or health care professional as soon as possible:  allergic reactions like skin rash, itching or hives, swelling of the face, lips, or tongue  breathing problems  blood in the urine  bloody or watery diarrhea or black, tarry stools  changes in emotions or moods  changes in vision  chest pain  cough  dizziness  feeling faint or lightheaded, falls  fever, chills  headache with fever, neck stiffness, confusion, loss of memory, sensitivity to light, hallucination, loss of contact with reality, or   seizures  joint pain  mouth sores  redness, blistering, peeling or loosening of the skin, including inside the mouth  severe muscle pain or weakness  signs and symptoms of high blood sugar such as dizziness; dry mouth; dry skin; fruity breath; nausea; stomach pain; increased hunger or thirst;  increased urination  signs and symptoms of kidney injury like trouble passing urine or change in the amount of urine  signs and symptoms of liver injury like dark yellow or brown urine; general ill feeling or flu-like symptoms; light-colored stools; loss of appetite; nausea; right upper belly pain; unusually weak or tired; yellowing of the eyes or skin  swelling of the ankles, feet, hands  trouble passing urine or change in the amount of urine  unusually weak or tired  weight gain or loss Side effects that usually do not require medical attention (report to your doctor or health care professional if they continue or are bothersome):  bone pain  constipation  decreased appetite  diarrhea  muscle pain  nausea, vomiting  tiredness This list may not describe all possible side effects. Call your doctor for medical advice about side effects. You may report side effects to FDA at 1-800-FDA-1088. Where should I keep my medicine? This drug is given in a hospital or clinic and will not be stored at home. NOTE: This sheet is a summary. It may not cover all possible information. If you have questions about this medicine, talk to your doctor, pharmacist, or health care provider.  2020 Elsevier/Gold Standard (2018-11-11 10:04:50)  

## 2019-12-29 NOTE — Telephone Encounter (Signed)
appts made per 11.23.21 los and pt will receive on avs in tx room.... AOM

## 2019-12-29 NOTE — Progress Notes (Signed)
Pt discharged in no apparent distress. Pt left ambulatory without assistance. Pt aware of discharge instructions and verbalized understanding and had no further questions.  

## 2019-12-29 NOTE — Progress Notes (Signed)
Hematology and Oncology Follow Up Visit  Vincent David [id number] 25-Jun-1965 54 y.o. 12/29/2019   Principle Diagnosis:   Metastatic Renal Cell Carcinoma - bone mets  Current Therapy:    Nivoumab/Ipilimumab -- s/p cycle #3 -- started on 09/29/2019  Xgeva 120 mg sq q 3 months -- next dose in 03/2020  XRT to right iliac mass/ L5 vertebral body/ Left femoral neck  LEFT hip repair     Interim History:  Vincent David is back for follow-up. He is doing quite well.  He still is having quite a bit of back discomfort.  This seems to hurt when the stands up from sitting down.  Thankfully, he has responded to treatment.  His PET scan shows that he is responding.  He has less activity in the bones.  The left kidney now measures 5.3 x 4.2 cm.  He has decrease in his bone lesions.  They have decreased SUV value.  The L5 vertebral body lesion is about the same.  We will have to see if we cannot get a vertebroplasty set up for him.  I think this will help his quality of life.  He did go up to [place].  Unfortunately that he come back early because of a family member who had [place].  He has had no problems with nausea or vomiting.  He is going to the bathroom okay.  He has had no leg swelling.  He has had no rashes.  He has had no headache.  There is no cough or shortness of breath.  Overall, his performance status is ECOG 1.    Medications:  Current Outpatient Medications:  .  aspirin EC 81 MG tablet, Take 1 tablet (81 mg total) by mouth 2 (two) times daily., Disp: 60 tablet, Rfl: 0 .  lamoTRIgine (LAMICTAL) 100 MG tablet, Take 1 tablet (100 mg total) by mouth 2 (two) times daily., Disp: 180 tablet, Rfl: 1 .  nicotine (NICODERM CQ - DOSED IN MG/24 HR) 7 mg/24hr patch, Place 1 patch (7 mg total) onto the skin daily., Disp: 28 patch, Rfl: 0 .  oxyCODONE (OXYCONTIN) 15 mg 12 hr tablet, Take 1 tablet (15 mg total) by mouth every 12 (twelve) hours., Disp: 60 tablet, Rfl: 0 .  Oxycodone HCl 10  MG TABS, Take 1 tablet (10 mg total) by mouth every 4 (four) hours as needed., Disp: 90 tablet, Rfl: 0  Allergies: No Known Allergies  Past Medical History, Surgical history, Social history, and Family History were reviewed and updated.  Review of Systems: Review of Systems  Constitutional: Negative.   HENT:  Negative.   Eyes: Negative.   Respiratory: Negative.   Cardiovascular: Negative.   Gastrointestinal: Negative.   Endocrine: Negative.   Musculoskeletal: Positive for back pain and flank pain.  Skin: Negative.   Neurological: Negative.   Hematological: Negative.   Psychiatric/Behavioral: Negative.     Physical Exam:  vitals were not taken for this visit.   Wt Readings from Last 3 Encounters:  12/29/19 205 lb 1.9 oz (93 kg)  12/01/19 201 lb 8 oz (91.4 kg)  12/01/19 201 lb (91.2 kg)    Physical Exam Vitals reviewed.  HENT:     Head: Normocephalic and atraumatic.  Eyes:     Pupils: Pupils are equal, round, and reactive to light.  Cardiovascular:     Rate and Rhythm: Normal rate and regular rhythm.     Heart sounds: Normal heart sounds.  Pulmonary:     Effort: Pulmonary effort  is normal.     Breath sounds: Normal breath sounds.  Abdominal:     General: Bowel sounds are normal.     Palpations: Abdomen is soft.  Musculoskeletal:        General: No tenderness or deformity. Normal range of motion.     Cervical back: Normal range of motion.  Lymphadenopathy:     Cervical: No cervical adenopathy.  Skin:    General: Skin is warm and dry.     Findings: No erythema or rash.  Neurological:     Mental Status: He is alert and oriented to person, place, and time.  Psychiatric:        Behavior: Behavior normal.        Thought Content: Thought content normal.        Judgment: Judgment normal.      Lab Results  Component Value Date   WBC 6.1 12/29/2019   HGB 13.2 12/29/2019   HCT 41.5 12/29/2019   MCV 87.4 12/29/2019   PLT 295 12/29/2019     Chemistry       Component Value Date/Time   NA 140 12/29/2019 1211   K 4.0 12/29/2019 1211   CL 105 12/29/2019 1211   CO2 29 12/29/2019 1211   BUN 11 12/29/2019 1211   CREATININE 1.02 12/29/2019 1211   CREATININE 0.91 12/01/2019 0955   CREATININE 1.15 08/25/2019 1450      Component Value Date/Time   CALCIUM 9.6 12/29/2019 1211   ALKPHOS 117 12/29/2019 1211   AST 13 (L) 12/29/2019 1211   AST 15 12/01/2019 0955   ALT 12 12/29/2019 1211   ALT 15 12/01/2019 0955   BILITOT 0.3 12/29/2019 1211   BILITOT 0.3 12/01/2019 0955      Impression and Plan: Vincent David is a very nice 54 year old white male.  He has metastatic renal cell carcinoma.  For some reason, his disease he clearly favors his bones.  His lungs and liver and lymph nodes all appear to be clean.  Thankfully, the scans look good.  At this point, we will just go with single agent nivolumab.  I think this would be very reasonable.  Hopefully, we will be able to get a vertebroplasty done on him at L5.  I hope that this might help his quality of life.  I would like to think that the immunotherapy effect will last quite a while.  Again, we're dealing with quality of life issues here.  This is most important.  We'll plan to get him back in 1 month now.     11/23/20211:21 PM

## 2019-12-30 ENCOUNTER — Encounter: Payer: Self-pay | Admitting: *Deleted

## 2019-12-30 LAB — TSH: TSH: 1.771 u[IU]/mL (ref 0.320–4.118)

## 2019-12-30 LAB — T4: T4, Total: 5.5 ug/dL (ref 4.5–12.0)

## 2019-12-30 NOTE — Progress Notes (Signed)
Oncology Nurse Navigator Documentation  Oncology Nurse Navigator Flowsheets 12/30/2019  Abnormal Finding Date -  Confirmed Diagnosis Date -  Diagnosis Status -  Planned Course of Treatment -  Phase of Treatment -  Chemotherapy Pending- Reason: -  Chemotherapy Actual Start Date: -  Navigator Follow Up Date: 01/27/2020  Navigator Follow Up Reason: Follow-up Appointment;Chemotherapy  Navigator Location [facility]  Referral Date to RadOnc/MedOnc -  Navigator Encounter Type Appt/Treatment Plan Review  Telephone -  Treatment Initiated Date -  Patient Visit Type MedOnc  Treatment Phase Active Tx  Barriers/Navigation Needs Coordination of Care;Education;Family Concerns  Education -  Interventions None Required  Acuity Level 2-Minimal Needs (1-2 Barriers Identified)  Referrals -  Coordination of Care -  Education Method -  Support Groups/Services Friends and Family  Time Spent with Patient 15

## 2020-01-06 ENCOUNTER — Ambulatory Visit (INDEPENDENT_AMBULATORY_CARE_PROVIDER_SITE_OTHER): Payer: BC Managed Care – PPO | Admitting: Adult Health

## 2020-01-06 ENCOUNTER — Encounter: Payer: Self-pay | Admitting: Adult Health

## 2020-01-06 VITALS — BP 137/82 | HR 92 | Ht 67.0 in | Wt 205.8 lb

## 2020-01-06 DIAGNOSIS — G4733 Obstructive sleep apnea (adult) (pediatric): Secondary | ICD-10-CM | POA: Diagnosis not present

## 2020-01-06 DIAGNOSIS — R569 Unspecified convulsions: Secondary | ICD-10-CM

## 2020-01-06 DIAGNOSIS — Z5181 Encounter for therapeutic drug level monitoring: Secondary | ICD-10-CM

## 2020-01-06 DIAGNOSIS — Z9989 Dependence on other enabling machines and devices: Secondary | ICD-10-CM | POA: Diagnosis not present

## 2020-01-06 NOTE — Patient Instructions (Addendum)
Your Plan:  Continue Lamictal 100 mg twice a day Continue CPAP If your symptoms worsen or you develop new symptoms please let us know.   Thank you for coming to see [place] at [facility]. I hope we have been able to provide you high quality care today.  You may receive a patient satisfaction survey over the next few weeks. We would appreciate your feedback and comments so that we may continue to improve ourselves and the health of our patients.

## 2020-01-06 NOTE — Progress Notes (Signed)
PATIENT: Vincent David DOB: [birth year]  REASON FOR VISIT: follow up HISTORY FROM: patient  HISTORY OF PRESENT ILLNESS: Today 01/06/20:  Mr. Vincent David is a 54 year old male with a history of nocturnal seizures and obstructive sleep apnea on CPAP he returns today for follow-up.  His CPAP download indicates that he uses machine nightly for compliance of 100%.  He uses machine greater than 4 hours each night.  On average he uses his machine 9 hours and 14 minutes.  His residual AHI is 1.6 on 6 to 15 cm water with EPR of 3.  He reports that the CPAP works well for him.  He does note that some nights he does not sleep well.  Reports that he has a jittery sensation in his knees.  This started after his cancer treatment.  The patient currently is on Lamictal 100 mg twice a day.  He denies any seizure-like events since his visit in July.  He states that sometimes he has an event during the day where his wife reports that he has comprehensible speech.  He states that this only last for several seconds.  He does not feel that it is seizure related.  He feels that his blood sugar may drop.  He never has checked his blood sugar to confirm this.  He also states that it may be related to his pain medication.  He returns today for an evaluation.  To note: he has had an EMU admission in May but did not show epileptic events.  He has had normal EEGs in the past.  It is questionable whether these events are epileptic or nonepileptic.  HISTORY (Copied from Dr.Dohmeier's note)  09/03/2019 in a RV.  Mr. Vincent David had last been seen in April in this office, at the time we made a decision to wean him off Keppra and onto Lamictal as a medication of choice for his nocturnal recurrent seizure-like events.  He was admitted to the epilepsy monitoring unit from the 24th through 27 May for video EEG monitoring his Keppra had been held at the time he was exposed to hyperventilation and photic stimulation, all his seizures has had  happened during sleep therefore sleep deprivation was not a planned part of his work-up.  On the contrary we wanted him to sleep.  No typical events were captured patient requested to go home on the 27th as it was difficult for him to sleep in the hospital environment and multiple times employees had entered the epilepsy monitoring room and disturb him.  The diagnosis of nonepileptic events was discussed with the patient Keppra had not substantially change the frequency and therefore a change to lamotrigine have been recommended, he had a normal EEG background rhythm of 10 Hz frontocentral region sleep spindles were noted symmetrically no EEG changes in hyperventilation or photic stimulation no epileptiform discharges were seen during the recording in daytime or at night again the patient felt that he was on independently sleep deprived by the comings and goings on the hospital floor.  After his discharge on the 27th he then suffered a seizure in early July this was after he had already been off Keppra and on Lamictal.  He is now using 50 mg twice daily, which we can increase 200 mg twice daily and further steps however he has at this time another medical concern.  Unfortunately he was diagnosed with a malignancy a finding that occurred while he was worked up for hip pain.PET scan revealed left sided kidney cancer, spreading  to lumbar and hip regions. [name]    REVIEW OF SYSTEMS: Out of a complete 14 system review of symptoms, the patient complains only of the following symptoms, and all other reviewed systems are negative.  See HPI   ALLERGIES: No Known Allergies  HOME MEDICATIONS: Outpatient Medications Prior to Visit  Medication Sig Dispense Refill  . aspirin EC 81 MG tablet Take 1 tablet (81 mg total) by mouth 2 (two) times daily. 60 tablet 0  . lamoTRIgine (LAMICTAL) 100 MG tablet Take 1 tablet (100 mg total) by mouth 2 (two) times daily. 180 tablet 1  . nicotine (NICODERM CQ - DOSED IN MG/24 HR) 7  mg/24hr patch Place 1 patch (7 mg total) onto the skin daily. 28 patch 0  . oxyCODONE (OXYCONTIN) 15 mg 12 hr tablet Take 1 tablet (15 mg total) by mouth every 12 (twelve) hours. 60 tablet 0  . Oxycodone HCl 10 MG TABS Take 1 tablet (10 mg total) by mouth every 4 (four) hours as needed. 90 tablet 0   No facility-administered medications prior to visit.    PAST MEDICAL HISTORY: Past Medical History:  Diagnosis Date  . Cancer ([place])   . Cancer of kidney ([place])   . Goals of care, counseling/discussion 08/28/2019  . Postictal headache   . Seizure ([place])   . Sleep apnea   . Tinnitus     PAST SURGICAL HISTORY: Past Surgical History:  Procedure Laterality Date  . IR IMAGING GUIDED PORT INSERTION  09/28/2019  . KNEE SURGERY Left   . ORIF PELVIC FRACTURE Left 09/14/2019   Procedure: OPEN REDUCTION INTERNAL FIXATION (ORIF) LEFT HIP WITH AFFIXUS NAIL;  Surgeon: Frederik Pear, MD;  Location: [facility];  Service: Orthopedics;  Laterality: Left;  . SHOULDER SURGERY Left     FAMILY HISTORY: Family History  Problem Relation Age of Onset  . Sleep apnea Mother   . Diabetes Mother   . High blood pressure Father   . Cancer Maternal Uncle        type unknown "either pancreatic or kidney"    SOCIAL HISTORY: Social History   Socioeconomic History  . Marital status: Married    Spouse name: Otila Kluver  . Number of children: 3  . Years of education: Not on file  . Highest education level: Not on file  Occupational History    Employer: OTHER  Tobacco Use  . Smoking status: Current Every Day Smoker    Packs/day: 0.50    Types: Cigarettes  . Smokeless tobacco: Never Used  . Tobacco comment: using nicotine patch  Vaping Use  . Vaping Use: Never used  Substance and Sexual Activity  . Alcohol use: Yes    Alcohol/week: 4.0 standard drinks    Types: 4 Standard drinks or equivalent per week  . Drug use: Never  . Sexual activity: Yes    Partners: Female  Other Topics Concern  . Not on file  Social  History Narrative  . Not on file   Social Determinants of Health   Financial Resource Strain:   . Difficulty of Paying Living Expenses: Not on file  Food Insecurity:   . Worried About Charity fundraiser in the Last Year: Not on file  . Ran Out of Food in the Last Year: Not on file  Transportation Needs:   . Lack of Transportation (Medical): Not on file  . Lack of Transportation (Non-Medical): Not on file  Physical Activity:   . Days of Exercise per Week: Not on file  .  Minutes of Exercise per Session: Not on file  Stress:   . Feeling of Stress : Not on file  Social Connections:   . Frequency of Communication with Friends and Family: Not on file  . Frequency of Social Gatherings with Friends and Family: Not on file  . Attends Religious Services: Not on file  . Active Member of Clubs or Organizations: Not on file  . Attends Archivist Meetings: Not on file  . Marital Status: Not on file  Intimate Partner Violence:   . Fear of Current or Ex-Partner: Not on file  . Emotionally Abused: Not on file  . Physically Abused: Not on file  . Sexually Abused: Not on file      PHYSICAL EXAM  Vitals:   01/06/20 0759  BP: 137/82  Pulse: 92  Weight: 205 lb 12.8 oz (93.4 kg)  Height: 5\' 7"  (1.702 m)   Body mass index is 32.23 kg/m.  Generalized: Well developed, in no acute distress   Neurological examination  Mentation: Alert oriented to time, place, history taking. Follows all commands speech and language fluent Cranial nerve II-XII: Pupils were equal round reactive to light. Extraocular movements were full, visual field were full on confrontational test. . Head turning and shoulder shrug  were normal and symmetric. Motor: The motor testing reveals 5 over 5 strength of all 4 extremities. Good symmetric motor tone is noted throughout.  Sensory: Sensory testing is intact to soft touch on all 4 extremities. No evidence of extinction is noted.  Coordination: Cerebellar  testing reveals good finger-nose-finger and heel-to-shin bilaterally.  Gait and station: Gait is normal.  Reflexes: Deep tendon reflexes are symmetric and normal bilaterally.   DIAGNOSTIC DATA (LABS, IMAGING, TESTING) - I reviewed patient records, labs, notes, testing and imaging myself where available.  Lab Results  Component Value Date   WBC 6.1 12/29/2019   HGB 13.2 12/29/2019   HCT 41.5 12/29/2019   MCV 87.4 12/29/2019   PLT 295 12/29/2019      Component Value Date/Time   NA 140 12/29/2019 1211   K 4.0 12/29/2019 1211   CL 105 12/29/2019 1211   CO2 29 12/29/2019 1211   GLUCOSE 116 (H) 12/29/2019 1211   BUN 11 12/29/2019 1211   CREATININE 1.02 12/29/2019 1211   CREATININE 0.91 12/01/2019 0955   CREATININE 1.15 08/25/2019 1450   CALCIUM 9.6 12/29/2019 1211   PROT 6.7 12/29/2019 1211   ALBUMIN 4.0 12/29/2019 1211   AST 13 (L) 12/29/2019 1211   AST 15 12/01/2019 0955   ALT 12 12/29/2019 1211   ALT 15 12/01/2019 0955   ALKPHOS 117 12/29/2019 1211   BILITOT 0.3 12/29/2019 1211   BILITOT 0.3 12/01/2019 0955   GFRNONAA >60 12/29/2019 1211   GFRNONAA >60 12/01/2019 0955   GFRNONAA 71 08/25/2019 1450   GFRAA >60 10/20/2019 1117   GFRAA 83 08/25/2019 1450   Lab Results  Component Value Date   CHOL 206 (H) 02/04/2019   HDL 34 (L) 02/04/2019   LDLCALC 146 (H) 02/04/2019   TRIG 133 02/04/2019   CHOLHDL 6.1 (H) 02/04/2019   No results found for: HGBA1C No results found for: VITAMINB12 Lab Results  Component Value Date   TSH 1.771 12/29/2019      ASSESSMENT AND PLAN 54 y.o. year old male  has a past medical history of Cancer ([place]), Cancer of kidney ([place]), Goals of care, counseling/discussion (08/28/2019), Postictal headache, Seizure ([place]), Sleep apnea, and Tinnitus. here with:  1.  Seizures  questionable nonepileptic  --Continue Lamictal 100 mg twice a day --We will check Lamictal level today --Advised patient to monitor speech events.  Advised to know if these  events occur with medication intake such as his pain medicine?  If the events become more frequent patient also let us know  2.  Obstructive sleep apnea on CPAP  --Excellent compliance --Good treatment of apnea --Encouraged to continue using nightly and greater than 4 hours each night   --Follow-up in 6 months or sooner if needed   I spent 25 minutes of face-to-face and non-face-to-face time with patient.  This included previsit chart review, lab review, study review, order entry, electronic health record documentation, patient education.  [name], MSN, NP-C 01/06/2020, 8:26 AM [facility] [address] [place], [place] [address] [phone]

## 2020-01-08 LAB — LAMOTRIGINE LEVEL: Lamotrigine Lvl: 3.8 ug/mL (ref 2.0–20.0)

## 2020-01-12 DIAGNOSIS — M545 Low back pain, unspecified: Secondary | ICD-10-CM | POA: Diagnosis not present

## 2020-01-27 ENCOUNTER — Inpatient Hospital Stay: Payer: BC Managed Care – PPO

## 2020-01-27 ENCOUNTER — Other Ambulatory Visit: Payer: Self-pay | Admitting: Hematology & Oncology

## 2020-01-27 ENCOUNTER — Encounter: Payer: Self-pay | Admitting: Hematology & Oncology

## 2020-01-27 ENCOUNTER — Inpatient Hospital Stay (HOSPITAL_BASED_OUTPATIENT_CLINIC_OR_DEPARTMENT_OTHER): Payer: BC Managed Care – PPO | Admitting: Hematology & Oncology

## 2020-01-27 ENCOUNTER — Other Ambulatory Visit: Payer: Self-pay

## 2020-01-27 ENCOUNTER — Encounter: Payer: Self-pay | Admitting: *Deleted

## 2020-01-27 ENCOUNTER — Inpatient Hospital Stay: Payer: BC Managed Care – PPO | Attending: Hematology & Oncology

## 2020-01-27 VITALS — BP 144/86 | HR 91 | Temp 98.1°F | Resp 20 | Wt 208.0 lb

## 2020-01-27 DIAGNOSIS — C642 Malignant neoplasm of left kidney, except renal pelvis: Secondary | ICD-10-CM | POA: Insufficient documentation

## 2020-01-27 DIAGNOSIS — Z79899 Other long term (current) drug therapy: Secondary | ICD-10-CM | POA: Insufficient documentation

## 2020-01-27 DIAGNOSIS — C7951 Secondary malignant neoplasm of bone: Secondary | ICD-10-CM | POA: Diagnosis not present

## 2020-01-27 DIAGNOSIS — Z7982 Long term (current) use of aspirin: Secondary | ICD-10-CM | POA: Diagnosis not present

## 2020-01-27 DIAGNOSIS — Z5112 Encounter for antineoplastic immunotherapy: Secondary | ICD-10-CM | POA: Diagnosis not present

## 2020-01-27 DIAGNOSIS — Z9189 Other specified personal risk factors, not elsewhere classified: Secondary | ICD-10-CM

## 2020-01-27 LAB — CBC WITH DIFFERENTIAL (CANCER CENTER ONLY)
Abs Immature Granulocytes: 0.05 10*3/uL (ref 0.00–0.07)
Basophils Absolute: 0 10*3/uL (ref 0.0–0.1)
Basophils Relative: 1 %
Eosinophils Absolute: 0 10*3/uL (ref 0.0–0.5)
Eosinophils Relative: 0 %
HCT: 44.8 % (ref 39.0–52.0)
Hemoglobin: 14.6 g/dL (ref 13.0–17.0)
Immature Granulocytes: 1 %
Lymphocytes Relative: 13 %
Lymphs Abs: 0.8 10*3/uL (ref 0.7–4.0)
MCH: 29.1 pg (ref 26.0–34.0)
MCHC: 32.6 g/dL (ref 30.0–36.0)
MCV: 89.2 fL (ref 80.0–100.0)
Monocytes Absolute: 0.4 10*3/uL (ref 0.1–1.0)
Monocytes Relative: 7 %
Neutro Abs: 5.1 10*3/uL (ref 1.7–7.7)
Neutrophils Relative %: 78 %
Platelet Count: 262 10*3/uL (ref 150–400)
RBC: 5.02 MIL/uL (ref 4.22–5.81)
RDW: 17.4 % — ABNORMAL HIGH (ref 11.5–15.5)
WBC Count: 6.5 10*3/uL (ref 4.0–10.5)
nRBC: 0 % (ref 0.0–0.2)

## 2020-01-27 LAB — CMP (CANCER CENTER ONLY)
ALT: 14 U/L (ref 0–44)
AST: 13 U/L — ABNORMAL LOW (ref 15–41)
Albumin: 4.2 g/dL (ref 3.5–5.0)
Alkaline Phosphatase: 112 U/L (ref 38–126)
Anion gap: 8 (ref 5–15)
BUN: 16 mg/dL (ref 6–20)
CO2: 26 mmol/L (ref 22–32)
Calcium: 9.3 mg/dL (ref 8.9–10.3)
Chloride: 103 mmol/L (ref 98–111)
Creatinine: 0.97 mg/dL (ref 0.61–1.24)
GFR, Estimated: >60 mL/min (ref >60)
Glucose, Bld: 138 mg/dL — ABNORMAL HIGH (ref 70–99)
Potassium: 4.1 mmol/L (ref 3.5–5.1)
Sodium: 137 mmol/L (ref 135–145)
Total Bilirubin: 0.4 mg/dL (ref 0.3–1.2)
Total Protein: 6.7 g/dL (ref 6.5–8.1)

## 2020-01-27 LAB — TSH: TSH: 2.499 u[IU]/mL (ref 0.320–4.118)

## 2020-01-27 LAB — LACTATE DEHYDROGENASE: LDH: 142 U/L (ref 98–192)

## 2020-01-27 MED ORDER — TEMAZEPAM 15 MG PO CAPS: 15.0000 mg | ORAL_CAPSULE | Freq: Every evening | ORAL | 0 refills | Status: DC | PRN

## 2020-01-27 MED ORDER — SODIUM CHLORIDE 0.9 % IV SOLN
Freq: Once | INTRAVENOUS | Status: AC
  Filled 2020-01-27: qty 250

## 2020-01-27 MED ORDER — SODIUM CHLORIDE 0.9 % IV SOLN
480.0000 mg | Freq: Once | INTRAVENOUS | Status: AC
  Administered 2020-01-27: 12:00:00 480 mg via INTRAVENOUS
  Filled 2020-01-27: qty 48

## 2020-01-27 MED ORDER — DENOSUMAB 120 MG/1.7ML ~~LOC~~ SOLN
120.0000 mg | Freq: Once | SUBCUTANEOUS | Status: AC
Start: 2020-01-27 — End: 2020-01-27
  Administered 2020-01-27: 12:00:00 120 mg via SUBCUTANEOUS

## 2020-01-27 MED ORDER — SODIUM CHLORIDE 0.9% FLUSH
10.0000 mL | INTRAVENOUS | Status: DC | PRN
  Administered 2020-01-27: 12:00:00 10 mL
  Filled 2020-01-27: qty 10

## 2020-01-27 MED ORDER — OXYCODONE HCL 10 MG PO TABS: 10.0000 mg | ORAL_TABLET | ORAL | 0 refills | Status: DC | PRN

## 2020-01-27 MED ORDER — DENOSUMAB 120 MG/1.7ML ~~LOC~~ SOLN
SUBCUTANEOUS | Status: AC
  Filled 2020-01-27: qty 1.7

## 2020-01-27 MED ORDER — HEPARIN SOD (PORK) LOCK FLUSH 100 UNIT/ML IV SOLN
500.0000 [IU] | Freq: Once | INTRAVENOUS | Status: AC | PRN
  Administered 2020-01-27: 12:00:00 500 [IU]
  Filled 2020-01-27: qty 5

## 2020-01-27 NOTE — Addendum Note (Signed)
Addended by: Burney Gauze R on: 01/27/2020 11:28 AM   Modules accepted: Orders

## 2020-01-27 NOTE — Patient Instructions (Signed)
Denosumab injection What is this medicine? DENOSUMAB (den oh sue mab) slows bone breakdown. Prolia is used to treat osteoporosis in women after menopause and in men, and in people who are taking corticosteroids for 6 months or more. Xgeva is used to treat a high calcium level due to cancer and to prevent bone fractures and other bone problems caused by multiple myeloma or cancer bone metastases. Xgeva is also used to treat giant cell tumor of the bone. This medicine may be used for other purposes; ask your health care provider or pharmacist if you have questions. COMMON BRAND NAME(S): Prolia, XGEVA What should I tell my health care provider before I take this medicine? They need to know if you have any of these conditions:  dental disease  having surgery or tooth extraction  infection  kidney disease  low levels of calcium or Vitamin D in the blood  malnutrition  on hemodialysis  skin conditions or sensitivity  thyroid or parathyroid disease  an unusual reaction to denosumab, other medicines, foods, dyes, or preservatives  pregnant or trying to get pregnant  breast-feeding How should I use this medicine? This medicine is for injection under the skin. It is given by a health care professional in a hospital or clinic setting. A special MedGuide will be given to you before each treatment. Be sure to read this information carefully each time. For Prolia, talk to your pediatrician regarding the use of this medicine in children. Special care may be needed. For Xgeva, talk to your pediatrician regarding the use of this medicine in children. While this drug may be prescribed for children as young as 13 years for selected conditions, precautions do apply. Overdosage: If you think you have taken too much of this medicine contact a poison control center or emergency room at once. NOTE: This medicine is only for you. Do not share this medicine with others. What if I miss a dose? It is  important not to miss your dose. Call your doctor or health care professional if you are unable to keep an appointment. What may interact with this medicine? Do not take this medicine with any of the following medications:  other medicines containing denosumab This medicine may also interact with the following medications:  medicines that lower your chance of fighting infection  steroid medicines like prednisone or cortisone This list may not describe all possible interactions. Give your health care provider a list of all the medicines, herbs, non-prescription drugs, or dietary supplements you use. Also tell them if you smoke, drink alcohol, or use illegal drugs. Some items may interact with your medicine. What should I watch for while using this medicine? Visit your doctor or health care professional for regular checks on your progress. Your doctor or health care professional may order blood tests and other tests to see how you are doing. Call your doctor or health care professional for advice if you get a fever, chills or sore throat, or other symptoms of a cold or flu. Do not treat yourself. This drug may decrease your body's ability to fight infection. Try to avoid being around people who are sick. You should make sure you get enough calcium and vitamin D while you are taking this medicine, unless your doctor tells you not to. Discuss the foods you eat and the vitamins you take with your health care professional. See your dentist regularly. Brush and floss your teeth as directed. Before you have any dental work done, tell your dentist you are   receiving this medicine. Do not become pregnant while taking this medicine or for 5 months after stopping it. Talk with your doctor or health care professional about your birth control options while taking this medicine. Women should inform their doctor if they wish to become pregnant or think they might be pregnant. There is a potential for serious side  effects to an unborn child. Talk to your health care professional or pharmacist for more information. What side effects may I notice from receiving this medicine? Side effects that you should report to your doctor or health care professional as soon as possible:  allergic reactions like skin rash, itching or hives, swelling of the face, lips, or tongue  bone pain  breathing problems  dizziness  jaw pain, especially after dental work  redness, blistering, peeling of the skin  signs and symptoms of infection like fever or chills; cough; sore throat; pain or trouble passing urine  signs of low calcium like fast heartbeat, muscle cramps or muscle pain; pain, tingling, numbness in the hands or feet; seizures  unusual bleeding or bruising  unusually weak or tired Side effects that usually do not require medical attention (report to your doctor or health care professional if they continue or are bothersome):  constipation  diarrhea  headache  joint pain  loss of appetite  muscle pain  runny nose  tiredness  upset stomach This list may not describe all possible side effects. Call your doctor for medical advice about side effects. You may report side effects to FDA at 1-800-FDA-1088. Where should I keep my medicine? This medicine is only given in a clinic, doctor's office, or other health care setting and will not be stored at home. NOTE: This sheet is a summary. It may not cover all possible information. If you have questions about this medicine, talk to your doctor, pharmacist, or health care provider.  2020 Elsevier/Gold Standard (2017-05-31 16:10:44)   Nivolumab injection What is this medicine? NIVOLUMAB (nye VOL ue mab) is a monoclonal antibody. It is used to treat colon cancer, esophageal cancer, head and neck cancer, Hodgkin lymphoma, kidney cancer, liver cancer, lung cancer, mesothelioma, melanoma, and urothelial cancer. This medicine may be used for other  purposes; ask your health care provider or pharmacist if you have questions. COMMON BRAND NAME(S): Opdivo What should I tell my health care provider before I take this medicine? They need to know if you have any of these conditions:  diabetes  immune system problems  kidney disease  liver disease  lung disease  organ transplant  stomach or intestine problems  thyroid disease  an unusual or allergic reaction to nivolumab, other medicines, foods, dyes, or preservatives  pregnant or trying to get pregnant  breast-feeding How should I use this medicine? This medicine is for infusion into a vein. It is given by a health care professional in a hospital or clinic setting. A special MedGuide will be given to you before each treatment. Be sure to read this information carefully each time. Talk to your pediatrician regarding the use of this medicine in children. While this drug may be prescribed for children as young as 12 years for selected conditions, precautions do apply. Overdosage: If you think you have taken too much of this medicine contact a poison control center or emergency room at once. NOTE: This medicine is only for you. Do not share this medicine with others. What if I miss a dose? It is important not to miss your dose. Call your doctor  or health care professional if you are unable to keep an appointment. What may interact with this medicine? Interactions have not been studied. Give your health care provider a list of all the medicines, herbs, non-prescription drugs, or dietary supplements you use. Also tell them if you smoke, drink alcohol, or use illegal drugs. Some items may interact with your medicine. This list may not describe all possible interactions. Give your health care provider a list of all the medicines, herbs, non-prescription drugs, or dietary supplements you use. Also tell them if you smoke, drink alcohol, or use illegal drugs. Some items may interact with  your medicine. What should I watch for while using this medicine? This drug may make you feel generally unwell. Continue your course of treatment even though you feel ill unless your doctor tells you to stop. You may need blood work done while you are taking this medicine. Do not become pregnant while taking this medicine or for 5 months after stopping it. Women should inform their doctor if they wish to become pregnant or think they might be pregnant. There is a potential for serious side effects to an unborn child. Talk to your health care professional or pharmacist for more information. Do not breast-feed an infant while taking this medicine or for 5 months after stopping it. What side effects may I notice from receiving this medicine? Side effects that you should report to your doctor or health care professional as soon as possible:  allergic reactions like skin rash, itching or hives, swelling of the face, lips, or tongue  breathing problems  blood in the urine  bloody or watery diarrhea or black, tarry stools  changes in emotions or moods  changes in vision  chest pain  cough  dizziness  feeling faint or lightheaded, falls  fever, chills  headache with fever, neck stiffness, confusion, loss of memory, sensitivity to light, hallucination, loss of contact with reality, or seizures  joint pain  mouth sores  redness, blistering, peeling or loosening of the skin, including inside the mouth  severe muscle pain or weakness  signs and symptoms of high blood sugar such as dizziness; dry mouth; dry skin; fruity breath; nausea; stomach pain; increased hunger or thirst; increased urination  signs and symptoms of kidney injury like trouble passing urine or change in the amount of urine  signs and symptoms of liver injury like dark yellow or brown urine; general ill feeling or flu-like symptoms; light-colored stools; loss of appetite; nausea; right upper belly pain; unusually weak  or tired; yellowing of the eyes or skin  swelling of the ankles, feet, hands  trouble passing urine or change in the amount of urine  unusually weak or tired  weight gain or loss Side effects that usually do not require medical attention (report to your doctor or health care professional if they continue or are bothersome):  bone pain  constipation  decreased appetite  diarrhea  muscle pain  nausea, vomiting  tiredness This list may not describe all possible side effects. Call your doctor for medical advice about side effects. You may report side effects to FDA at 1-800-FDA-1088. Where should I keep my medicine? This drug is given in a hospital or clinic and will not be stored at home. NOTE: This sheet is a summary. It may not cover all possible information. If you have questions about this medicine, talk to your doctor, pharmacist, or health care provider.  2020 Elsevier/Gold Standard (2018-11-11 10:04:50)

## 2020-01-27 NOTE — Patient Instructions (Signed)

## 2020-01-27 NOTE — Progress Notes (Addendum)
Hematology and Oncology Follow Up Visit  Vincent David [id number] 06-04-1965 54 y.o. 01/27/2020   Principle Diagnosis:   Metastatic Renal Cell Carcinoma - bone mets  Current Therapy:    Nivoumab/Ipilimumab -- s/p cycle #3 -- started on 09/29/2019  Xgeva 120 mg sq q 3 months -- next dose in 04/2020  XRT to right iliac mass/ L5 vertebral body/ Left femoral neck  LEFT hip repair     Interim History:  Vincent David is back for follow-up.  He is doing pretty well.  He did see Dr. Lynann David of orthopedic surgery.  Unfortunately, there was nothing that could be done with respect to the L5 vertebral body lesion.  There is no indication for a kyphoplasty.  There is certainly was no indication for surgery as this would be a major procedure.  He is doing pretty well with pain right now.  He has pain in his thighs.  He did have a fracture in the left femur that was fixed.  We are getting him a handicap sticker as he is having hard time walking a significant distance.  He has a good appetite.  He and his wife will have their sons over for Christmas.  They are looking forward to this.  He has had no problems with cough or shortness of breath.  He still not sleeping well.  He is tried over-the-counter remedies without any effect.  We will try him on Restoril at 15 mg p.o. nightly.  He has had no rashes.  He has had no problems with his thyroid.  Overall, his performance status right now is ECOG 1.   Medications:  Current Outpatient Medications:  .  aspirin EC 81 MG tablet, Take 1 tablet (81 mg total) by mouth 2 (two) times daily., Disp: 60 tablet, Rfl: 0 .  lamoTRIgine (LAMICTAL) 100 MG tablet, Take 1 tablet (100 mg total) by mouth 2 (two) times daily., Disp: 180 tablet, Rfl: 1 .  nicotine (NICODERM CQ - DOSED IN MG/24 HR) 7 mg/24hr patch, Place 1 patch (7 mg total) onto the skin daily., Disp: 28 patch, Rfl: 0 .  oxyCODONE (OXYCONTIN) 15 mg 12 hr tablet, Take 1 tablet (15 mg total) by mouth  every 12 (twelve) hours., Disp: 60 tablet, Rfl: 0 .  Oxycodone HCl 10 MG TABS, Take 1 tablet (10 mg total) by mouth every 4 (four) hours as needed., Disp: 90 tablet, Rfl: 0  Allergies: No Known Allergies  Past Medical History, Surgical history, Social history, and Family History were reviewed and updated.  Review of Systems: Review of Systems  Constitutional: Negative.   HENT:  Negative.   Eyes: Negative.   Respiratory: Negative.   Cardiovascular: Negative.   Gastrointestinal: Negative.   Endocrine: Negative.   Musculoskeletal: Positive for back pain and flank pain.  Skin: Negative.   Neurological: Negative.   Hematological: Negative.   Psychiatric/Behavioral: Negative.     Physical Exam:  weight is 208 lb 0.6 oz (94.4 kg). His temperature is 98.1 F (36.7 C). His blood pressure is 144/86 (abnormal) and his pulse is 91. His respiration is 20 and oxygen saturation is 97%.   Wt Readings from Last 3 Encounters:  01/27/20 208 lb 0.6 oz (94.4 kg)  01/06/20 205 lb 12.8 oz (93.4 kg)  12/29/19 205 lb 1.9 oz (93 kg)    Physical Exam Vitals reviewed.  HENT:     Head: Normocephalic and atraumatic.  Eyes:     Pupils: Pupils are equal, round, and reactive to light.  Cardiovascular:     Rate and Rhythm: Normal rate and regular rhythm.     Heart sounds: Normal heart sounds.  Pulmonary:     Effort: Pulmonary effort is normal.     Breath sounds: Normal breath sounds.  Abdominal:     General: Bowel sounds are normal.     Palpations: Abdomen is soft.  Musculoskeletal:        General: No tenderness or deformity. Normal range of motion.     Cervical back: Normal range of motion.  Lymphadenopathy:     Cervical: No cervical adenopathy.  Skin:    General: Skin is warm and dry.     Findings: No erythema or rash.  Neurological:     Mental Status: He is alert and oriented to person, place, and time.  Psychiatric:        Behavior: Behavior normal.        Thought Content: Thought  content normal.        Judgment: Judgment normal.      Lab Results  Component Value Date   WBC 6.5 01/27/2020   HGB 14.6 01/27/2020   HCT 44.8 01/27/2020   MCV 89.2 01/27/2020   PLT 262 01/27/2020     Chemistry      Component Value Date/Time   NA 137 01/27/2020 0915   K 4.1 01/27/2020 0915   CL 103 01/27/2020 0915   CO2 26 01/27/2020 0915   BUN 16 01/27/2020 0915   CREATININE 0.97 01/27/2020 0915   CREATININE 1.15 08/25/2019 1450      Component Value Date/Time   CALCIUM 9.3 01/27/2020 0915   ALKPHOS 112 01/27/2020 0915   AST 13 (L) 01/27/2020 0915   ALT 14 01/27/2020 0915   BILITOT 0.4 01/27/2020 0915      Impression and Plan: Mr. Vincent David is a very nice 54 year old white male.  He has metastatic renal cell carcinoma.  For some reason, his disease he clearly favors his bones.  His lungs and liver and lymph nodes all appear to be clean.  I am happy that his quality of life is doing pretty well right now.  He seems to be tolerating therapy.  We just had scans done all of them so I do not think we have to do any further scans probably until February.  Again we are focused on his quality of life.  This is doing pretty well right now.  He is still not able to work.  He still not able to concentrate because of his medications which I totally understand.  He will get Xgeva today.  We will plan to get him back in 1 month now.     12/22/202110:31 AM

## 2020-01-27 NOTE — Progress Notes (Signed)
Oncology Nurse Navigator Documentation  Oncology Nurse Navigator Flowsheets 01/27/2020  Abnormal Finding Date -  Confirmed Diagnosis Date -  Diagnosis Status -  Planned Course of Treatment -  Phase of Treatment -  Chemotherapy Pending- Reason: -  Chemotherapy Actual Start Date: -  Navigator Follow Up Date: 02/25/2020  Navigator Follow Up Reason: Follow-up Appointment;Chemotherapy  Navigator Location [facility]  Referral Date to RadOnc/MedOnc -  Navigator Encounter Type Appt/Treatment Plan Review  Telephone -  Treatment Initiated Date -  Patient Visit Type MedOnc  Treatment Phase Active Tx  Barriers/Navigation Needs No Barriers At This Time  Education -  Interventions None Required  Acuity Level 1-No Barriers  Referrals -  Coordination of Care -  Education Method -  Support Groups/Services Friends and Family  Time Spent with Patient 15

## 2020-01-28 LAB — T4: T4, Total: 5.6 ug/dL (ref 4.5–12.0)

## 2020-02-25 ENCOUNTER — Inpatient Hospital Stay: Payer: BC Managed Care – PPO

## 2020-02-25 ENCOUNTER — Encounter: Payer: Self-pay | Admitting: *Deleted

## 2020-02-25 ENCOUNTER — Inpatient Hospital Stay (HOSPITAL_BASED_OUTPATIENT_CLINIC_OR_DEPARTMENT_OTHER): Payer: BC Managed Care – PPO | Admitting: Hematology & Oncology

## 2020-02-25 ENCOUNTER — Encounter: Payer: Self-pay | Admitting: Hematology & Oncology

## 2020-02-25 ENCOUNTER — Inpatient Hospital Stay: Payer: BC Managed Care – PPO | Attending: Hematology & Oncology

## 2020-02-25 ENCOUNTER — Other Ambulatory Visit: Payer: Self-pay

## 2020-02-25 VITALS — BP 146/76 | HR 91 | Temp 99.4°F | Resp 18 | Wt 209.0 lb

## 2020-02-25 DIAGNOSIS — M79652 Pain in left thigh: Secondary | ICD-10-CM | POA: Diagnosis not present

## 2020-02-25 DIAGNOSIS — Z7982 Long term (current) use of aspirin: Secondary | ICD-10-CM | POA: Diagnosis not present

## 2020-02-25 DIAGNOSIS — C642 Malignant neoplasm of left kidney, except renal pelvis: Secondary | ICD-10-CM | POA: Insufficient documentation

## 2020-02-25 DIAGNOSIS — C7951 Secondary malignant neoplasm of bone: Secondary | ICD-10-CM | POA: Diagnosis not present

## 2020-02-25 DIAGNOSIS — Z79899 Other long term (current) drug therapy: Secondary | ICD-10-CM | POA: Diagnosis not present

## 2020-02-25 DIAGNOSIS — Z5112 Encounter for antineoplastic immunotherapy: Secondary | ICD-10-CM | POA: Diagnosis not present

## 2020-02-25 LAB — CBC WITH DIFFERENTIAL (CANCER CENTER ONLY)
Abs Immature Granulocytes: 0.03 10*3/uL (ref 0.00–0.07)
Basophils Absolute: 0 10*3/uL (ref 0.0–0.1)
Basophils Relative: 1 %
Eosinophils Absolute: 0.1 10*3/uL (ref 0.0–0.5)
Eosinophils Relative: 1 %
HCT: 43.4 % (ref 39.0–52.0)
Hemoglobin: 14.5 g/dL (ref 13.0–17.0)
Immature Granulocytes: 1 %
Lymphocytes Relative: 12 %
Lymphs Abs: 0.8 10*3/uL (ref 0.7–4.0)
MCH: 30 pg (ref 26.0–34.0)
MCHC: 33.4 g/dL (ref 30.0–36.0)
MCV: 89.7 fL (ref 80.0–100.0)
Monocytes Absolute: 0.4 10*3/uL (ref 0.1–1.0)
Monocytes Relative: 6 %
Neutro Abs: 5.3 10*3/uL (ref 1.7–7.7)
Neutrophils Relative %: 79 %
Platelet Count: 214 10*3/uL (ref 150–400)
RBC: 4.84 MIL/uL (ref 4.22–5.81)
RDW: 15.3 % (ref 11.5–15.5)
WBC Count: 6.5 10*3/uL (ref 4.0–10.5)
nRBC: 0 % (ref 0.0–0.2)

## 2020-02-25 LAB — CMP (CANCER CENTER ONLY)
ALT: 13 U/L (ref 0–44)
AST: 14 U/L — ABNORMAL LOW (ref 15–41)
Albumin: 4 g/dL (ref 3.5–5.0)
Alkaline Phosphatase: 101 U/L (ref 38–126)
Anion gap: 7 (ref 5–15)
BUN: 14 mg/dL (ref 6–20)
CO2: 25 mmol/L (ref 22–32)
Calcium: 8.6 mg/dL — ABNORMAL LOW (ref 8.9–10.3)
Chloride: 103 mmol/L (ref 98–111)
Creatinine: 0.94 mg/dL (ref 0.61–1.24)
GFR, Estimated: >60 mL/min (ref >60)
Glucose, Bld: 163 mg/dL — ABNORMAL HIGH (ref 70–99)
Potassium: 3.9 mmol/L (ref 3.5–5.1)
Sodium: 135 mmol/L (ref 135–145)
Total Bilirubin: 0.4 mg/dL (ref 0.3–1.2)
Total Protein: 6.5 g/dL (ref 6.5–8.1)

## 2020-02-25 LAB — TSH: TSH: 1.611 u[IU]/mL (ref 0.320–4.118)

## 2020-02-25 LAB — LACTATE DEHYDROGENASE: LDH: 136 U/L (ref 98–192)

## 2020-02-25 MED ORDER — SODIUM CHLORIDE 0.9% FLUSH
10.0000 mL | INTRAVENOUS | Status: DC | PRN
Start: 2020-02-25 — End: 2020-02-25
  Administered 2020-02-25: 10 mL
  Filled 2020-02-25: qty 10

## 2020-02-25 MED ORDER — SODIUM CHLORIDE 0.9 % IV SOLN
480.0000 mg | Freq: Once | INTRAVENOUS | Status: AC
  Administered 2020-02-25: 480 mg via INTRAVENOUS
  Filled 2020-02-25: qty 48

## 2020-02-25 MED ORDER — SODIUM CHLORIDE 0.9 % IV SOLN
Freq: Once | INTRAVENOUS | Status: AC
  Filled 2020-02-25: qty 250

## 2020-02-25 MED ORDER — HEPARIN SOD (PORK) LOCK FLUSH 100 UNIT/ML IV SOLN
500.0000 [IU] | Freq: Once | INTRAVENOUS | Status: AC | PRN
  Administered 2020-02-25: 500 [IU]
  Filled 2020-02-25: qty 5

## 2020-02-25 NOTE — Progress Notes (Signed)
Patient needs PET scan scheduled. Spoke with Gaffer and she will submit request for PA. Will follow up tomorrow as schedule PET.  Oncology Nurse Navigator Documentation  Oncology Nurse Navigator Flowsheets 02/25/2020  Abnormal Finding Date -  Confirmed Diagnosis Date -  Diagnosis Status -  Planned Course of Treatment -  Phase of Treatment -  Chemotherapy Pending- Reason: -  Chemotherapy Actual Start Date: -  Navigator Follow Up Date: 02/26/2020  Navigator Follow Up Reason: Appointment Review  Navigator Location [facility]  Referral Date to RadOnc/MedOnc -  Navigator Encounter Type Appt/Treatment Plan Review  Telephone -  Treatment Initiated Date -  Patient Visit Type MedOnc  Treatment Phase Active Tx  Barriers/Navigation Needs Coordination of Care  Education -  Interventions Coordination of Care  Acuity Level 2-Minimal Needs (1-2 Barriers Identified)  Referrals -  Coordination of Care Radiology  Education Method -  Support Groups/Services Friends and Family  Time Spent with Patient 30

## 2020-02-25 NOTE — Patient Instructions (Signed)
Nivolumab injection What is this medicine? NIVOLUMAB (nye VOL ue mab) is a monoclonal antibody. It treats certain types of cancer. Some of the cancers treated are colon cancer, head and neck cancer, Hodgkin lymphoma, lung cancer, and melanoma. This medicine may be used for other purposes; ask your health care provider or pharmacist if you have questions. COMMON BRAND NAME(S): Opdivo What should I tell my health care provider before I take this medicine? They need to know if you have any of these conditions:  autoimmune diseases like Crohn's disease, ulcerative colitis, or lupus  have had or planning to have an allogeneic stem cell transplant (uses someone else's stem cells)  history of chest radiation  history of organ transplant  nervous system problems like myasthenia gravis or Guillain-Barre syndrome  an unusual or allergic reaction to nivolumab, other medicines, foods, dyes, or preservatives  pregnant or trying to get pregnant  breast-feeding How should I use this medicine? This medicine is for infusion into a vein. It is given by a health care professional in a hospital or clinic setting. A special MedGuide will be given to you before each treatment. Be sure to read this information carefully each time. Talk to your pediatrician regarding the use of this medicine in children. While this drug may be prescribed for children as young as 12 years for selected conditions, precautions do apply. Overdosage: If you think you have taken too much of this medicine contact a poison control center or emergency room at once. NOTE: This medicine is only for you. Do not share this medicine with others. What if I miss a dose? It is important not to miss your dose. Call your doctor or health care professional if you are unable to keep an appointment. What may interact with this medicine? Interactions have not been studied. This list may not describe all possible interactions. Give your health  care provider a list of all the medicines, herbs, non-prescription drugs, or dietary supplements you use. Also tell them if you smoke, drink alcohol, or use illegal drugs. Some items may interact with your medicine. What should I watch for while using this medicine? This drug may make you feel generally unwell. Continue your course of treatment even though you feel ill unless your doctor tells you to stop. You may need blood work done while you are taking this medicine. Do not become pregnant while taking this medicine or for 5 months after stopping it. Women should inform their doctor if they wish to become pregnant or think they might be pregnant. There is a potential for serious side effects to an unborn child. Talk to your health care professional or pharmacist for more information. Do not breast-feed an infant while taking this medicine or for 5 months after stopping it. What side effects may I notice from receiving this medicine? Side effects that you should report to your doctor or health care professional as soon as possible:  allergic reactions like skin rash, itching or hives, swelling of the face, lips, or tongue  breathing problems  blood in the urine  bloody or watery diarrhea or black, tarry stools  changes in emotions or moods  changes in vision  chest pain  cough  dizziness  feeling faint or lightheaded, falls  fever, chills  headache with fever, neck stiffness, confusion, loss of memory, sensitivity to light, hallucination, loss of contact with reality, or seizures  joint pain  mouth sores  redness, blistering, peeling or loosening of the skin, including inside the   mouth  severe muscle pain or weakness  signs and symptoms of high blood sugar such as dizziness; dry mouth; dry skin; fruity breath; nausea; stomach pain; increased hunger or thirst; increased urination  signs and symptoms of kidney injury like trouble passing urine or change in the amount of  urine  signs and symptoms of liver injury like dark yellow or brown urine; general ill feeling or flu-like symptoms; light-colored stools; loss of appetite; nausea; right upper belly pain; unusually weak or tired; yellowing of the eyes or skin  swelling of the ankles, feet, hands  trouble passing urine or change in the amount of urine  unusually weak or tired  weight gain or loss Side effects that usually do not require medical attention (report to your doctor or health care professional if they continue or are bothersome):  bone pain  constipation  decreased appetite  diarrhea  muscle pain  nausea, vomiting  tiredness This list may not describe all possible side effects. Call your doctor for medical advice about side effects. You may report side effects to FDA at 1-800-FDA-1088. Where should I keep my medicine? This drug is given in a hospital or clinic and will not be stored at home. NOTE: This sheet is a summary. It may not cover all possible information. If you have questions about this medicine, talk to your doctor, pharmacist, or health care provider.  2021 Elsevier/Gold Standard (2019-05-27 10:08:25)  

## 2020-02-25 NOTE — Progress Notes (Signed)
Hematology and Oncology Follow Up Visit  Rose Hippler [id number] [birth year] 55 y.o. 02/25/2020   Principle Diagnosis:   Metastatic Renal Cell Carcinoma - bone mets  Current Therapy:    Nivoumab/Ipilimumab -- s/p cycle #3 -- started on 09/29/2019 -- maintenance Nivolumab q month -- Started on 12/29/2019  Xgeva 120 mg sq q 3 months -- next dose in 04/2020  XRT to right iliac mass/ L5 vertebral body/ Left femoral neck  LEFT hip repair     Interim History:  Mr. Korol is back for follow-up.  His problem now is that he is having more pain in the left thigh.  This is where he had the pathologic fracture and had this fixed.  I am not sure why we would be having the pain.  He sees Dr. Mayer Camel next week.  I am sure that x-rays will have to be done.  I am not sure exactly why there would be the pain.  He seems to have pain when he stands.  He cannot stand on the left leg because of the discomfort.  Otherwise, he is doing okay.  Had a nice Christmas and New Year's.  He has had a good appetite.  He has had no nausea or vomiting.  He has had no fever.  He has had no cough.  There has been no change in bowel or bladder habits.  He is on the "maintenance" dose of nivolumab now.    There has been no bleeding.  He has had no leg swelling.  Overall, I would say his performance status is ECOG 1.    Medications:  Current Outpatient Medications:  .  aspirin EC 81 MG tablet, Take 1 tablet (81 mg total) by mouth 2 (two) times daily., Disp: 60 tablet, Rfl: 0 .  lamoTRIgine (LAMICTAL) 100 MG tablet, Take 1 tablet (100 mg total) by mouth 2 (two) times daily., Disp: 180 tablet, Rfl: 1 .  nicotine (NICODERM CQ - DOSED IN MG/24 HR) 7 mg/24hr patch, Place 1 patch (7 mg total) onto the skin daily., Disp: 28 patch, Rfl: 0 .  oxyCODONE (OXYCONTIN) 15 mg 12 hr tablet, Take 1 tablet (15 mg total) by mouth every 12 (twelve) hours., Disp: 60 tablet, Rfl: 0 .  Oxycodone HCl 10 MG TABS, Take 1 tablet (10 mg total) by  mouth every 4 (four) hours as needed., Disp: 90 tablet, Rfl: 0 .  temazepam (RESTORIL) 15 MG capsule, Take 1 capsule (15 mg total) by mouth at bedtime as needed for sleep., Disp: 30 capsule, Rfl: 0  Allergies: No Known Allergies  Past Medical History, Surgical history, Social history, and Family History were reviewed and updated.  Review of Systems: Review of Systems  Constitutional: Negative.   HENT:  Negative.   Eyes: Negative.   Respiratory: Negative.   Cardiovascular: Negative.   Gastrointestinal: Negative.   Endocrine: Negative.   Musculoskeletal: Positive for back pain and flank pain.  Skin: Negative.   Neurological: Negative.   Hematological: Negative.   Psychiatric/Behavioral: Negative.     Physical Exam:  weight is 209 lb (94.8 kg). His oral temperature is 99.4 F (37.4 C). His blood pressure is 146/76 (abnormal) and his pulse is 91. His respiration is 18 and oxygen saturation is 98%.   Wt Readings from Last 3 Encounters:  02/25/20 209 lb (94.8 kg)  01/27/20 208 lb 0.6 oz (94.4 kg)  01/06/20 205 lb 12.8 oz (93.4 kg)    Physical Exam Vitals reviewed.  HENT:  Head: Normocephalic and atraumatic.  Eyes:     Pupils: Pupils are equal, round, and reactive to light.  Cardiovascular:     Rate and Rhythm: Normal rate and regular rhythm.     Heart sounds: Normal heart sounds.  Pulmonary:     Effort: Pulmonary effort is normal.     Breath sounds: Normal breath sounds.  Abdominal:     General: Bowel sounds are normal.     Palpations: Abdomen is soft.  Musculoskeletal:        General: No tenderness or deformity. Normal range of motion.     Cervical back: Normal range of motion.  Lymphadenopathy:     Cervical: No cervical adenopathy.  Skin:    General: Skin is warm and dry.     Findings: No erythema or rash.  Neurological:     Mental Status: He is alert and oriented to person, place, and time.  Psychiatric:        Behavior: Behavior normal.        Thought  Content: Thought content normal.        Judgment: Judgment normal.      Lab Results  Component Value Date   WBC 6.5 01/27/2020   HGB 14.6 01/27/2020   HCT 44.8 01/27/2020   MCV 89.2 01/27/2020   PLT 262 01/27/2020     Chemistry      Component Value Date/Time   NA 137 01/27/2020 0915   K 4.1 01/27/2020 0915   CL 103 01/27/2020 0915   CO2 26 01/27/2020 0915   BUN 16 01/27/2020 0915   CREATININE 0.97 01/27/2020 0915   CREATININE 1.15 08/25/2019 1450      Component Value Date/Time   CALCIUM 9.3 01/27/2020 0915   ALKPHOS 112 01/27/2020 0915   AST 13 (L) 01/27/2020 0915   ALT 14 01/27/2020 0915   BILITOT 0.4 01/27/2020 0915      Impression and Plan: Mr. Cavanah is a very nice 55 year old white male.  He has metastatic renal cell carcinoma.  For some reason, his disease he clearly favors his bones.  His lungs and liver and lymph nodes all appear to be clean.  I am puzzled as to why he has the issue with his left thigh.  Again, I am sure that Dr. Mayer Camel will do some x-rays.  We are going to get him scanned from our perspective.  This usually will tell us how things are going.  We get a PET scan and this usually gives [place] a good idea as to what is going on.    We will plan to get him back in 1 month now.     1/20/20228:16 AM

## 2020-02-26 ENCOUNTER — Encounter: Payer: Self-pay | Admitting: *Deleted

## 2020-02-26 NOTE — Progress Notes (Signed)
PET scan scheduled. Called and spoke to patient's wife, Otila Kluver. Appointment date, time and preparation reviewed with Otila Kluver. Radiology info sheet also sent via mail for education reinforcement.   Oncology Nurse Navigator Documentation  Oncology Nurse Navigator Flowsheets 02/26/2020  Abnormal Finding Date -  Confirmed Diagnosis Date -  Diagnosis Status -  Planned Course of Treatment -  Phase of Treatment -  Chemotherapy Pending- Reason: -  Chemotherapy Actual Start Date: -  Navigator Follow Up Date: 03/22/2020  Navigator Follow Up Reason: Scan Review  Navigator Location [facility]  Referral Date to RadOnc/MedOnc -  Navigator Encounter Type Telephone;Appt/Treatment Plan Review  Telephone Outgoing Call;Appt Confirmation/Clarification  Treatment Initiated Date -  Patient Visit Type MedOnc  Treatment Phase Active Tx  Barriers/Navigation Needs Coordination of Care  Education Other  Interventions Coordination of Care;Psycho-Social Support  Acuity Level 2-Minimal Needs (1-2 Barriers Identified)  Referrals -  Coordination of Care Radiology  Education Method Verbal;Written;Teach-back  Support Groups/Services Friends and Family  Time Spent with Patient 18

## 2020-03-01 DIAGNOSIS — M25552 Pain in left hip: Secondary | ICD-10-CM | POA: Diagnosis not present

## 2020-03-02 ENCOUNTER — Other Ambulatory Visit: Payer: Self-pay | Admitting: Orthopedic Surgery

## 2020-03-02 DIAGNOSIS — M25552 Pain in left hip: Secondary | ICD-10-CM

## 2020-03-03 ENCOUNTER — Ambulatory Visit (INDEPENDENT_AMBULATORY_CARE_PROVIDER_SITE_OTHER): Payer: BC Managed Care – PPO

## 2020-03-03 ENCOUNTER — Other Ambulatory Visit: Payer: Self-pay

## 2020-03-03 ENCOUNTER — Encounter: Payer: Self-pay | Admitting: Hematology & Oncology

## 2020-03-03 DIAGNOSIS — M25552 Pain in left hip: Secondary | ICD-10-CM | POA: Diagnosis not present

## 2020-03-03 DIAGNOSIS — Z85528 Personal history of other malignant neoplasm of kidney: Secondary | ICD-10-CM | POA: Diagnosis not present

## 2020-03-03 DIAGNOSIS — R2242 Localized swelling, mass and lump, left lower limb: Secondary | ICD-10-CM | POA: Diagnosis not present

## 2020-03-03 DIAGNOSIS — S7225XA Nondisplaced subtrochanteric fracture of left femur, initial encounter for closed fracture: Secondary | ICD-10-CM

## 2020-03-03 DIAGNOSIS — M8548 Solitary bone cyst, other site: Secondary | ICD-10-CM | POA: Diagnosis not present

## 2020-03-04 ENCOUNTER — Other Ambulatory Visit: Payer: Self-pay | Admitting: Neurology

## 2020-03-08 DIAGNOSIS — M25552 Pain in left hip: Secondary | ICD-10-CM | POA: Diagnosis not present

## 2020-03-22 ENCOUNTER — Ambulatory Visit (HOSPITAL_COMMUNITY)
Admission: RE | Admit: 2020-03-22 | Discharge: 2020-03-22 | Disposition: A | Discharge status: Home / Self Care | Payer: BC Managed Care – PPO | Source: Ambulatory Visit | Attending: Hematology & Oncology | Admitting: Hematology & Oncology

## 2020-03-22 ENCOUNTER — Other Ambulatory Visit: Payer: Self-pay

## 2020-03-22 DIAGNOSIS — C649 Malignant neoplasm of unspecified kidney, except renal pelvis: Secondary | ICD-10-CM | POA: Diagnosis not present

## 2020-03-22 DIAGNOSIS — C642 Malignant neoplasm of left kidney, except renal pelvis: Secondary | ICD-10-CM | POA: Diagnosis not present

## 2020-03-22 LAB — GLUCOSE, CAPILLARY: Glucose-Capillary: 94 mg/dL (ref 70–99)

## 2020-03-22 MED ORDER — FLUDEOXYGLUCOSE F - 18 (FDG) INJECTION
10.8000 | Freq: Once | INTRAVENOUS | Status: AC
  Administered 2020-03-22: 10.4 via INTRAVENOUS

## 2020-03-23 ENCOUNTER — Encounter: Payer: Self-pay | Admitting: *Deleted

## 2020-03-23 NOTE — Progress Notes (Signed)
Oncology Nurse Navigator Documentation  Oncology Nurse Navigator Flowsheets 03/23/2020  Abnormal Finding Date -  Confirmed Diagnosis Date -  Diagnosis Status -  Planned Course of Treatment -  Phase of Treatment -  Chemotherapy Pending- Reason: -  Chemotherapy Actual Start Date: -  Navigator Follow Up Date: 03/24/2020  Navigator Follow Up Reason: Follow-up Appointment;Chemotherapy  Navigator Location [facility]  Referral Date to RadOnc/MedOnc -  Navigator Encounter Type Scan Review  Telephone -  Treatment Initiated Date -  Patient Visit Type MedOnc  Treatment Phase Active Tx  Barriers/Navigation Needs Coordination of Care  Education -  Interventions None Required  Acuity Level 2-Minimal Needs (1-2 Barriers Identified)  Referrals -  Coordination of Care -  Education Method -  Support Groups/Services Friends and Family  Time Spent with Patient 15

## 2020-03-24 ENCOUNTER — Inpatient Hospital Stay: Payer: BC Managed Care – PPO

## 2020-03-24 ENCOUNTER — Telehealth: Payer: Self-pay | Admitting: Hematology & Oncology

## 2020-03-24 ENCOUNTER — Inpatient Hospital Stay: Payer: BC Managed Care – PPO | Attending: Hematology & Oncology

## 2020-03-24 ENCOUNTER — Encounter: Payer: Self-pay | Admitting: Hematology & Oncology

## 2020-03-24 ENCOUNTER — Encounter: Payer: Self-pay | Admitting: *Deleted

## 2020-03-24 ENCOUNTER — Other Ambulatory Visit: Payer: Self-pay

## 2020-03-24 ENCOUNTER — Inpatient Hospital Stay (HOSPITAL_BASED_OUTPATIENT_CLINIC_OR_DEPARTMENT_OTHER): Payer: BC Managed Care – PPO | Admitting: Hematology & Oncology

## 2020-03-24 VITALS — BP 131/71 | HR 78 | Temp 98.8°F | Resp 20 | Wt 212.0 lb

## 2020-03-24 DIAGNOSIS — Z5112 Encounter for antineoplastic immunotherapy: Secondary | ICD-10-CM | POA: Diagnosis not present

## 2020-03-24 DIAGNOSIS — Z7982 Long term (current) use of aspirin: Secondary | ICD-10-CM | POA: Insufficient documentation

## 2020-03-24 DIAGNOSIS — Z79899 Other long term (current) drug therapy: Secondary | ICD-10-CM | POA: Diagnosis not present

## 2020-03-24 DIAGNOSIS — C642 Malignant neoplasm of left kidney, except renal pelvis: Secondary | ICD-10-CM | POA: Insufficient documentation

## 2020-03-24 DIAGNOSIS — C7951 Secondary malignant neoplasm of bone: Secondary | ICD-10-CM | POA: Insufficient documentation

## 2020-03-24 LAB — CBC WITH DIFFERENTIAL (CANCER CENTER ONLY)
Abs Immature Granulocytes: 0.03 10*3/uL (ref 0.00–0.07)
Basophils Absolute: 0.1 10*3/uL (ref 0.0–0.1)
Basophils Relative: 1 %
Eosinophils Absolute: 0.3 10*3/uL (ref 0.0–0.5)
Eosinophils Relative: 5 %
HCT: 43.1 % (ref 39.0–52.0)
Hemoglobin: 14.6 g/dL (ref 13.0–17.0)
Immature Granulocytes: 0 %
Lymphocytes Relative: 15 %
Lymphs Abs: 1 10*3/uL (ref 0.7–4.0)
MCH: 30.7 pg (ref 26.0–34.0)
MCHC: 33.9 g/dL (ref 30.0–36.0)
MCV: 90.7 fL (ref 80.0–100.0)
Monocytes Absolute: 0.4 10*3/uL (ref 0.1–1.0)
Monocytes Relative: 6 %
Neutro Abs: 5 10*3/uL (ref 1.7–7.7)
Neutrophils Relative %: 73 %
Platelet Count: 257 10*3/uL (ref 150–400)
RBC: 4.75 MIL/uL (ref 4.22–5.81)
RDW: 14.2 % (ref 11.5–15.5)
WBC Count: 6.8 10*3/uL (ref 4.0–10.5)
nRBC: 0 % (ref 0.0–0.2)

## 2020-03-24 LAB — CMP (CANCER CENTER ONLY)
ALT: 21 U/L (ref 0–44)
AST: 26 U/L (ref 15–41)
Albumin: 4.3 g/dL (ref 3.5–5.0)
Alkaline Phosphatase: 91 U/L (ref 38–126)
Anion gap: 6 (ref 5–15)
BUN: 17 mg/dL (ref 6–20)
CO2: 27 mmol/L (ref 22–32)
Calcium: 9.1 mg/dL (ref 8.9–10.3)
Chloride: 104 mmol/L (ref 98–111)
Creatinine: 0.96 mg/dL (ref 0.61–1.24)
GFR, Estimated: >60 mL/min (ref >60)
Glucose, Bld: 117 mg/dL — ABNORMAL HIGH (ref 70–99)
Potassium: 4.3 mmol/L (ref 3.5–5.1)
Sodium: 137 mmol/L (ref 135–145)
Total Bilirubin: 0.4 mg/dL (ref 0.3–1.2)
Total Protein: 6.6 g/dL (ref 6.5–8.1)

## 2020-03-24 LAB — LACTATE DEHYDROGENASE: LDH: 177 U/L (ref 98–192)

## 2020-03-24 MED ORDER — SODIUM CHLORIDE 0.9 % IV SOLN
480.0000 mg | Freq: Once | INTRAVENOUS | Status: AC
  Administered 2020-03-24: 480 mg via INTRAVENOUS
  Filled 2020-03-24: qty 48

## 2020-03-24 MED ORDER — HEPARIN SOD (PORK) LOCK FLUSH 100 UNIT/ML IV SOLN
500.0000 [IU] | Freq: Once | INTRAVENOUS | Status: AC | PRN
  Administered 2020-03-24: 500 [IU]
  Filled 2020-03-24: qty 5

## 2020-03-24 MED ORDER — SODIUM CHLORIDE 0.9 % IV SOLN
Freq: Once | INTRAVENOUS | Status: AC
  Filled 2020-03-24: qty 250

## 2020-03-24 MED ORDER — SODIUM CHLORIDE 0.9% FLUSH
10.0000 mL | INTRAVENOUS | Status: DC | PRN
  Administered 2020-03-24: 10 mL
  Filled 2020-03-24: qty 10

## 2020-03-24 NOTE — Progress Notes (Signed)
Hematology and Oncology Follow Up Visit  Vincent David [id number] 1965/07/27 55 y.o. 03/24/2020   Principle Diagnosis:   Metastatic Renal Cell Carcinoma - bone mets  Current Therapy:    Nivoumab/Ipilimumab -- s/p cycle #3 -- started on 09/29/2019 -- maintenance Nivolumab q month -- Started on 12/29/2019  Xgeva 120 mg sq q 3 months -- next dose in 04/2020  XRT to right iliac mass/ L5 vertebral body/ Left femoral neck  LEFT hip repair     Interim History:  Vincent David is back for follow-up.  He seems to be doing a lot better.  He is not having much problems over with the left thigh.  He did see the orthopedist for this.  We did do a PET scan on him.  This was done a couple days ago.  The PET scan showed that he clearly was responding.  He had much less activity on SUV.  He still has the bony lesions with respect to the iliac bone and spine.  Again, there is more sclerosis.  He does not have any new lesions.  I am glad that his quality of life is doing better.  He has a Clinical research associate now.  He goes to workout 3 times a week.  He actually has a little bit of a sunburn.  I told him that he must wear sunscreen when he goes outside.  He has had no problem with bowels or bladder.  He has had no cough.  He has had no nausea or vomiting.  He has had no headache.  Overall, his performance status is ECOG 1.   Medications:  Current Outpatient Medications:  .  aspirin EC 81 MG tablet, Take 1 tablet (81 mg total) by mouth 2 (two) times daily., Disp: 60 tablet, Rfl: 0 .  lamoTRIgine (LAMICTAL) 100 MG tablet, TAKE 1 TABLET BY MOUTH TWICE A DAY, Disp: 180 tablet, Rfl: 1 .  nicotine (NICODERM CQ - DOSED IN MG/24 HR) 7 mg/24hr patch, Place 1 patch (7 mg total) onto the skin daily., Disp: 28 patch, Rfl: 0 .  Oxycodone HCl 10 MG TABS, Take 1 tablet (10 mg total) by mouth every 4 (four) hours as needed., Disp: 90 tablet, Rfl: 0 .  temazepam (RESTORIL) 15 MG capsule, Take 1 capsule (15 mg total) by mouth at  bedtime as needed for sleep., Disp: 30 capsule, Rfl: 0 .  oxyCODONE (OXYCONTIN) 15 mg 12 hr tablet, Take 1 tablet (15 mg total) by mouth every 12 (twelve) hours. (Patient not taking: Reported on 03/24/2020), Disp: 60 tablet, Rfl: 0  Allergies: No Known Allergies  Past Medical History, Surgical history, Social history, and Family History were reviewed and updated.  Review of Systems: Review of Systems  Constitutional: Negative.   HENT:  Negative.   Eyes: Negative.   Respiratory: Negative.   Cardiovascular: Negative.   Gastrointestinal: Negative.   Endocrine: Negative.   Musculoskeletal: Positive for back pain and flank pain.  Skin: Negative.   Neurological: Negative.   Hematological: Negative.   Psychiatric/Behavioral: Negative.     Physical Exam:  weight is 212 lb (96.2 kg). His oral temperature is 98.8 F (37.1 C). His blood pressure is 131/71 and his pulse is 78. His respiration is 20 and oxygen saturation is 98%.   Wt Readings from Last 3 Encounters:  03/24/20 212 lb (96.2 kg)  02/25/20 209 lb (94.8 kg)  01/27/20 208 lb 0.6 oz (94.4 kg)    Physical Exam Vitals reviewed.  HENT:  Head: Normocephalic and atraumatic.  Eyes:     Pupils: Pupils are equal, round, and reactive to light.  Cardiovascular:     Rate and Rhythm: Normal rate and regular rhythm.     Heart sounds: Normal heart sounds.  Pulmonary:     Effort: Pulmonary effort is normal.     Breath sounds: Normal breath sounds.  Abdominal:     General: Bowel sounds are normal.     Palpations: Abdomen is soft.  Musculoskeletal:        General: No tenderness or deformity. Normal range of motion.     Cervical back: Normal range of motion.  Lymphadenopathy:     Cervical: No cervical adenopathy.  Skin:    General: Skin is warm and dry.     Findings: No erythema or rash.  Neurological:     Mental Status: He is alert and oriented to person, place, and time.  Psychiatric:        Behavior: Behavior normal.         Thought Content: Thought content normal.        Judgment: Judgment normal.      Lab Results  Component Value Date   WBC 6.8 03/24/2020   HGB 14.6 03/24/2020   HCT 43.1 03/24/2020   MCV 90.7 03/24/2020   PLT 257 03/24/2020     Chemistry      Component Value Date/Time   NA 137 03/24/2020 1111   K 4.3 03/24/2020 1111   CL 104 03/24/2020 1111   CO2 27 03/24/2020 1111   BUN 17 03/24/2020 1111   CREATININE 0.96 03/24/2020 1111   CREATININE 1.15 08/25/2019 1450      Component Value Date/Time   CALCIUM 9.1 03/24/2020 1111   ALKPHOS 91 03/24/2020 1111   AST 26 03/24/2020 1111   ALT 21 03/24/2020 1111   BILITOT 0.4 03/24/2020 1111      Impression and Plan: Vincent David is a very nice 55 year old white male.  He has metastatic renal cell carcinoma.  For some reason, his disease he clearly favors his bones.  His lungs and liver and lymph nodes all appear to be clean.  I am very happy that the PET scan looked better.  Again he will always have abnormalities on the x-rays.  However the SUV activity really will show [place] how things are going.  We will continue him on therapy with nivolumab.  Again this is working.  He is tolerating it well.  He is more active.  His quality of life is better.  We will not have doing of the PET scan probably for about 3 months now.  I will plan to get her back in another month.     2/17/202211:50 AM

## 2020-03-24 NOTE — Patient Instructions (Signed)
Nivolumab injection What is this medicine? NIVOLUMAB (nye VOL ue mab) is a monoclonal antibody. It treats certain types of cancer. Some of the cancers treated are colon cancer, head and neck cancer, Hodgkin lymphoma, lung cancer, and melanoma. This medicine may be used for other purposes; ask your health care provider or pharmacist if you have questions. COMMON BRAND NAME(S): Opdivo What should I tell my health care provider before I take this medicine? They need to know if you have any of these conditions:  autoimmune diseases like Crohn's disease, ulcerative colitis, or lupus  have had or planning to have an allogeneic stem cell transplant (uses someone else's stem cells)  history of chest radiation  history of organ transplant  nervous system problems like myasthenia gravis or Guillain-Barre syndrome  an unusual or allergic reaction to nivolumab, other medicines, foods, dyes, or preservatives  pregnant or trying to get pregnant  breast-feeding How should I use this medicine? This medicine is for infusion into a vein. It is given by a health care professional in a hospital or clinic setting. A special MedGuide will be given to you before each treatment. Be sure to read this information carefully each time. Talk to your pediatrician regarding the use of this medicine in children. While this drug may be prescribed for children as young as 12 years for selected conditions, precautions do apply. Overdosage: If you think you have taken too much of this medicine contact a poison control center or emergency room at once. NOTE: This medicine is only for you. Do not share this medicine with others. What if I miss a dose? It is important not to miss your dose. Call your doctor or health care professional if you are unable to keep an appointment. What may interact with this medicine? Interactions have not been studied. This list may not describe all possible interactions. Give your health  care provider a list of all the medicines, herbs, non-prescription drugs, or dietary supplements you use. Also tell them if you smoke, drink alcohol, or use illegal drugs. Some items may interact with your medicine. What should I watch for while using this medicine? This drug may make you feel generally unwell. Continue your course of treatment even though you feel ill unless your doctor tells you to stop. You may need blood work done while you are taking this medicine. Do not become pregnant while taking this medicine or for 5 months after stopping it. Women should inform their doctor if they wish to become pregnant or think they might be pregnant. There is a potential for serious side effects to an unborn child. Talk to your health care professional or pharmacist for more information. Do not breast-feed an infant while taking this medicine or for 5 months after stopping it. What side effects may I notice from receiving this medicine? Side effects that you should report to your doctor or health care professional as soon as possible:  allergic reactions like skin rash, itching or hives, swelling of the face, lips, or tongue  breathing problems  blood in the urine  bloody or watery diarrhea or black, tarry stools  changes in emotions or moods  changes in vision  chest pain  cough  dizziness  feeling faint or lightheaded, falls  fever, chills  headache with fever, neck stiffness, confusion, loss of memory, sensitivity to light, hallucination, loss of contact with reality, or seizures  joint pain  mouth sores  redness, blistering, peeling or loosening of the skin, including inside the   mouth  severe muscle pain or weakness  signs and symptoms of high blood sugar such as dizziness; dry mouth; dry skin; fruity breath; nausea; stomach pain; increased hunger or thirst; increased urination  signs and symptoms of kidney injury like trouble passing urine or change in the amount of  urine  signs and symptoms of liver injury like dark yellow or brown urine; general ill feeling or flu-like symptoms; light-colored stools; loss of appetite; nausea; right upper belly pain; unusually weak or tired; yellowing of the eyes or skin  swelling of the ankles, feet, hands  trouble passing urine or change in the amount of urine  unusually weak or tired  weight gain or loss Side effects that usually do not require medical attention (report to your doctor or health care professional if they continue or are bothersome):  bone pain  constipation  decreased appetite  diarrhea  muscle pain  nausea, vomiting  tiredness This list may not describe all possible side effects. Call your doctor for medical advice about side effects. You may report side effects to FDA at 1-800-FDA-1088. Where should I keep my medicine? This drug is given in a hospital or clinic and will not be stored at home. NOTE: This sheet is a summary. It may not cover all possible information. If you have questions about this medicine, talk to your doctor, pharmacist, or health care provider.  2021 Elsevier/Gold Standard (2019-05-27 10:08:25)  

## 2020-03-24 NOTE — Telephone Encounter (Signed)
Appointments scheduled calendar printed per 2/17 los 

## 2020-03-24 NOTE — Progress Notes (Signed)
Oncology Nurse Navigator Documentation  Oncology Nurse Navigator Flowsheets 03/24/2020  Abnormal Finding Date -  Confirmed Diagnosis Date -  Diagnosis Status -  Planned Course of Treatment -  Phase of Treatment -  Chemotherapy Pending- Reason: -  Chemotherapy Actual Start Date: -  Navigator Follow Up Date: 04/20/2020  Navigator Follow Up Reason: Follow-up Appointment;Chemotherapy  Navigator Location [facility]  Referral Date to RadOnc/MedOnc -  Navigator Encounter Type Treatment;Appt/Treatment Plan Review  Telephone -  Treatment Initiated Date -  Patient Visit Type MedOnc  Treatment Phase Active Tx  Barriers/Navigation Needs Coordination of Care  Education -  Interventions Psycho-Social Support  Acuity Level 2-Minimal Needs (1-2 Barriers Identified)  Referrals -  Coordination of Care -  Education Method -  Support Groups/Services Friends and Family  Time Spent with Patient 30

## 2020-03-25 LAB — T4: T4, Total: 4.9 ug/dL (ref 4.5–12.0)

## 2020-03-25 LAB — TSH: TSH: 1.884 u[IU]/mL (ref 0.320–4.118)

## 2020-04-20 ENCOUNTER — Encounter: Payer: Self-pay | Admitting: Hematology & Oncology

## 2020-04-20 ENCOUNTER — Inpatient Hospital Stay: Payer: BC Managed Care – PPO

## 2020-04-20 ENCOUNTER — Encounter: Payer: Self-pay | Admitting: *Deleted

## 2020-04-20 ENCOUNTER — Inpatient Hospital Stay: Payer: BC Managed Care – PPO | Attending: Hematology & Oncology

## 2020-04-20 ENCOUNTER — Telehealth: Payer: Self-pay

## 2020-04-20 ENCOUNTER — Other Ambulatory Visit: Payer: Self-pay

## 2020-04-20 ENCOUNTER — Inpatient Hospital Stay (HOSPITAL_BASED_OUTPATIENT_CLINIC_OR_DEPARTMENT_OTHER): Payer: BC Managed Care – PPO | Admitting: Hematology & Oncology

## 2020-04-20 DIAGNOSIS — Z79899 Other long term (current) drug therapy: Secondary | ICD-10-CM | POA: Diagnosis not present

## 2020-04-20 DIAGNOSIS — C642 Malignant neoplasm of left kidney, except renal pelvis: Secondary | ICD-10-CM | POA: Insufficient documentation

## 2020-04-20 DIAGNOSIS — Z5112 Encounter for antineoplastic immunotherapy: Secondary | ICD-10-CM | POA: Diagnosis not present

## 2020-04-20 DIAGNOSIS — C7951 Secondary malignant neoplasm of bone: Secondary | ICD-10-CM | POA: Diagnosis not present

## 2020-04-20 DIAGNOSIS — Z7982 Long term (current) use of aspirin: Secondary | ICD-10-CM | POA: Diagnosis not present

## 2020-04-20 LAB — CBC WITH DIFFERENTIAL (CANCER CENTER ONLY)
Abs Immature Granulocytes: 0.03 10*3/uL (ref 0.00–0.07)
Basophils Absolute: 0 10*3/uL (ref 0.0–0.1)
Basophils Relative: 1 %
Eosinophils Absolute: 0.4 10*3/uL (ref 0.0–0.5)
Eosinophils Relative: 6 %
HCT: 42.3 % (ref 39.0–52.0)
Hemoglobin: 14.2 g/dL (ref 13.0–17.0)
Immature Granulocytes: 1 %
Lymphocytes Relative: 13 %
Lymphs Abs: 0.8 10*3/uL (ref 0.7–4.0)
MCH: 30.6 pg (ref 26.0–34.0)
MCHC: 33.6 g/dL (ref 30.0–36.0)
MCV: 91.2 fL (ref 80.0–100.0)
Monocytes Absolute: 0.4 10*3/uL (ref 0.1–1.0)
Monocytes Relative: 6 %
Neutro Abs: 4.4 10*3/uL (ref 1.7–7.7)
Neutrophils Relative %: 73 %
Platelet Count: 226 10*3/uL (ref 150–400)
RBC: 4.64 MIL/uL (ref 4.22–5.81)
RDW: 14.1 % (ref 11.5–15.5)
WBC Count: 6 10*3/uL (ref 4.0–10.5)
nRBC: 0 % (ref 0.0–0.2)

## 2020-04-20 LAB — CMP (CANCER CENTER ONLY)
ALT: 14 U/L (ref 0–44)
AST: 16 U/L (ref 15–41)
Albumin: 4.2 g/dL (ref 3.5–5.0)
Alkaline Phosphatase: 92 U/L (ref 38–126)
Anion gap: 6 (ref 5–15)
BUN: 12 mg/dL (ref 6–20)
CO2: 27 mmol/L (ref 22–32)
Calcium: 8.6 mg/dL — ABNORMAL LOW (ref 8.9–10.3)
Chloride: 103 mmol/L (ref 98–111)
Creatinine: 0.96 mg/dL (ref 0.61–1.24)
GFR, Estimated: >60 mL/min (ref >60)
Glucose, Bld: 113 mg/dL — ABNORMAL HIGH (ref 70–99)
Potassium: 4.1 mmol/L (ref 3.5–5.1)
Sodium: 136 mmol/L (ref 135–145)
Total Bilirubin: 0.5 mg/dL (ref 0.3–1.2)
Total Protein: 6.1 g/dL — ABNORMAL LOW (ref 6.5–8.1)

## 2020-04-20 LAB — LACTATE DEHYDROGENASE: LDH: 147 U/L (ref 98–192)

## 2020-04-20 LAB — TSH: TSH: 2.408 u[IU]/mL (ref 0.320–4.118)

## 2020-04-20 MED ORDER — OXYCODONE HCL 10 MG PO TABS: 10.0000 mg | ORAL_TABLET | ORAL | 0 refills | Status: DC | PRN

## 2020-04-20 MED ORDER — OXYCODONE HCL ER 15 MG PO T12A: 15.0000 mg | EXTENDED_RELEASE_TABLET | Freq: Two times a day (BID) | ORAL | 0 refills | Status: DC

## 2020-04-20 MED ORDER — SODIUM CHLORIDE 0.9 % IV SOLN
480.0000 mg | Freq: Once | INTRAVENOUS | Status: AC
  Administered 2020-04-20: 480 mg via INTRAVENOUS
  Filled 2020-04-20: qty 48

## 2020-04-20 MED ORDER — SODIUM CHLORIDE 0.9% FLUSH
10.0000 mL | INTRAVENOUS | Status: DC | PRN
  Administered 2020-04-20: 10 mL
  Filled 2020-04-20: qty 10

## 2020-04-20 MED ORDER — SODIUM CHLORIDE 0.9 % IV SOLN
Freq: Once | INTRAVENOUS | Status: AC
  Filled 2020-04-20: qty 250

## 2020-04-20 MED ORDER — HEPARIN SOD (PORK) LOCK FLUSH 100 UNIT/ML IV SOLN
500.0000 [IU] | Freq: Once | INTRAVENOUS | Status: AC | PRN
  Administered 2020-04-20: 500 [IU]
  Filled 2020-04-20: qty 5

## 2020-04-20 NOTE — Progress Notes (Signed)
Hold Xgeva today per Dr Marin Olp  Calcium 8.6.

## 2020-04-20 NOTE — Telephone Encounter (Signed)
Per 04/20/20 los his next appt is in place, an additional appt was also added for 06/15/20   Jb Dulworth

## 2020-04-20 NOTE — Patient Instructions (Signed)
Nivolumab injection What is this medicine? NIVOLUMAB (nye VOL ue mab) is a monoclonal antibody. It treats certain types of cancer. Some of the cancers treated are colon cancer, head and neck cancer, Hodgkin lymphoma, lung cancer, and melanoma. This medicine may be used for other purposes; ask your health care provider or pharmacist if you have questions. COMMON BRAND NAME(S): Opdivo What should I tell my health care provider before I take this medicine? They need to know if you have any of these conditions:  autoimmune diseases like Crohn's disease, ulcerative colitis, or lupus  have had or planning to have an allogeneic stem cell transplant (uses someone else's stem cells)  history of chest radiation  history of organ transplant  nervous system problems like myasthenia gravis or Guillain-Barre syndrome  an unusual or allergic reaction to nivolumab, other medicines, foods, dyes, or preservatives  pregnant or trying to get pregnant  breast-feeding How should I use this medicine? This medicine is for infusion into a vein. It is given by a health care professional in a hospital or clinic setting. A special MedGuide will be given to you before each treatment. Be sure to read this information carefully each time. Talk to your pediatrician regarding the use of this medicine in children. While this drug may be prescribed for children as young as 12 years for selected conditions, precautions do apply. Overdosage: If you think you have taken too much of this medicine contact a poison control center or emergency room at once. NOTE: This medicine is only for you. Do not share this medicine with others. What if I miss a dose? It is important not to miss your dose. Call your doctor or health care professional if you are unable to keep an appointment. What may interact with this medicine? Interactions have not been studied. This list may not describe all possible interactions. Give your health  care provider a list of all the medicines, herbs, non-prescription drugs, or dietary supplements you use. Also tell them if you smoke, drink alcohol, or use illegal drugs. Some items may interact with your medicine. What should I watch for while using this medicine? This drug may make you feel generally unwell. Continue your course of treatment even though you feel ill unless your doctor tells you to stop. You may need blood work done while you are taking this medicine. Do not become pregnant while taking this medicine or for 5 months after stopping it. Women should inform their doctor if they wish to become pregnant or think they might be pregnant. There is a potential for serious side effects to an unborn child. Talk to your health care professional or pharmacist for more information. Do not breast-feed an infant while taking this medicine or for 5 months after stopping it. What side effects may I notice from receiving this medicine? Side effects that you should report to your doctor or health care professional as soon as possible:  allergic reactions like skin rash, itching or hives, swelling of the face, lips, or tongue  breathing problems  blood in the urine  bloody or watery diarrhea or black, tarry stools  changes in emotions or moods  changes in vision  chest pain  cough  dizziness  feeling faint or lightheaded, falls  fever, chills  headache with fever, neck stiffness, confusion, loss of memory, sensitivity to light, hallucination, loss of contact with reality, or seizures  joint pain  mouth sores  redness, blistering, peeling or loosening of the skin, including inside the   mouth  severe muscle pain or weakness  signs and symptoms of high blood sugar such as dizziness; dry mouth; dry skin; fruity breath; nausea; stomach pain; increased hunger or thirst; increased urination  signs and symptoms of kidney injury like trouble passing urine or change in the amount of  urine  signs and symptoms of liver injury like dark yellow or brown urine; general ill feeling or flu-like symptoms; light-colored stools; loss of appetite; nausea; right upper belly pain; unusually weak or tired; yellowing of the eyes or skin  swelling of the ankles, feet, hands  trouble passing urine or change in the amount of urine  unusually weak or tired  weight gain or loss Side effects that usually do not require medical attention (report to your doctor or health care professional if they continue or are bothersome):  bone pain  constipation  decreased appetite  diarrhea  muscle pain  nausea, vomiting  tiredness This list may not describe all possible side effects. Call your doctor for medical advice about side effects. You may report side effects to FDA at 1-800-FDA-1088. Where should I keep my medicine? This drug is given in a hospital or clinic and will not be stored at home. NOTE: This sheet is a summary. It may not cover all possible information. If you have questions about this medicine, talk to your doctor, pharmacist, or health care provider.  2021 Elsevier/Gold Standard (2019-05-27 10:08:25)  

## 2020-04-20 NOTE — Progress Notes (Signed)
Oncology Nurse Navigator Documentation  Oncology Nurse Navigator Flowsheets 04/20/2020  Abnormal Finding Date -  Confirmed Diagnosis Date -  Diagnosis Status -  Planned Course of Treatment -  Phase of Treatment -  Chemotherapy Pending- Reason: -  Chemotherapy Actual Start Date: -  Navigator Follow Up Date: 05/19/2020  Navigator Follow Up Reason: Follow-up Appointment;Chemotherapy  Navigator Location [facility]  Referral Date to RadOnc/MedOnc -  Navigator Encounter Type Appt/Treatment Plan Review  Telephone -  Treatment Initiated Date -  Patient Visit Type MedOnc  Treatment Phase Active Tx  Barriers/Navigation Needs Coordination of Care  Education -  Interventions None Required  Acuity Level 2-Minimal Needs (1-2 Barriers Identified)  Referrals -  Coordination of Care -  Education Method -  Support Groups/Services Friends and Family  Time Spent with Patient 15

## 2020-04-20 NOTE — Patient Instructions (Signed)

## 2020-04-20 NOTE — Progress Notes (Signed)
Hematology and Oncology Follow Up Visit  Vincent David [id number] [birth year] 55 y.o. 04/20/2020   Principle Diagnosis:   Metastatic Renal Cell Carcinoma - bone mets  Current Therapy:    Nivoumab/Ipilimumab -- s/p cycle #3 -- started on 09/29/2019 -- maintenance Nivolumab q month -- Started on 12/29/2019  Xgeva 120 mg sq q 3 months -- next dose in 04/2020  XRT to right iliac mass/ L5 vertebral body/ Left femoral neck  LEFT hip repair     Interim History:  Mr. Age is back for follow-up.  He looks quite good.  His brother came down to visit from [place].  They been having a good time down here.  With the weather being nice, they have been able to go out and do quite a few things.  He still doing okay with respect to his pain.  He only takes the pain medicine as needed.  I told him he could certainly be more liberal with the pain medication if necessary.  He has had a good appetite.  He has had no nausea or vomiting.  He has had no change in bowel or bladder habits.  There is been no indication of fever.  He has had no bleeding.  There is been no rashes.  Overall, I would have to say his performance status is ECOG 1.    Medications:  Current Outpatient Medications:  .  aspirin EC 81 MG tablet, Take 1 tablet (81 mg total) by mouth 2 (two) times daily., Disp: 60 tablet, Rfl: 0 .  lamoTRIgine (LAMICTAL) 100 MG tablet, TAKE 1 TABLET BY MOUTH TWICE A DAY, Disp: 180 tablet, Rfl: 1 .  nicotine (NICODERM CQ - DOSED IN MG/24 HR) 7 mg/24hr patch, Place 1 patch (7 mg total) onto the skin daily., Disp: 28 patch, Rfl: 0 .  oxyCODONE (OXYCONTIN) 15 mg 12 hr tablet, Take 1 tablet (15 mg total) by mouth every 12 (twelve) hours., Disp: 60 tablet, Rfl: 0 .  Oxycodone HCl 10 MG TABS, Take 1 tablet (10 mg total) by mouth every 4 (four) hours as needed., Disp: 90 tablet, Rfl: 0 .  temazepam (RESTORIL) 15 MG capsule, Take 1 capsule (15 mg total) by mouth at bedtime as needed for sleep., Disp: 30  capsule, Rfl: 0  Allergies: No Known Allergies  Past Medical History, Surgical history, Social history, and Family History were reviewed and updated.  Review of Systems: Review of Systems  Constitutional: Negative.   HENT:  Negative.   Eyes: Negative.   Respiratory: Negative.   Cardiovascular: Negative.   Gastrointestinal: Negative.   Endocrine: Negative.   Musculoskeletal: Positive for back pain and flank pain.  Skin: Negative.   Neurological: Negative.   Hematological: Negative.   Psychiatric/Behavioral: Negative.     Physical Exam:  vitals were not taken for this visit.   Wt Readings from Last 3 Encounters:  03/24/20 212 lb (96.2 kg)  02/25/20 209 lb (94.8 kg)  01/27/20 208 lb 0.6 oz (94.4 kg)    Physical Exam Vitals reviewed.  HENT:     Head: Normocephalic and atraumatic.  Eyes:     Pupils: Pupils are equal, round, and reactive to light.  Cardiovascular:     Rate and Rhythm: Normal rate and regular rhythm.     Heart sounds: Normal heart sounds.  Pulmonary:     Effort: Pulmonary effort is normal.     Breath sounds: Normal breath sounds.  Abdominal:     General: Bowel sounds are normal.  Palpations: Abdomen is soft.  Musculoskeletal:        General: No tenderness or deformity. Normal range of motion.     Cervical back: Normal range of motion.  Lymphadenopathy:     Cervical: No cervical adenopathy.  Skin:    General: Skin is warm and dry.     Findings: No erythema or rash.  Neurological:     Mental Status: He is alert and oriented to person, place, and time.  Psychiatric:        Behavior: Behavior normal.        Thought Content: Thought content normal.        Judgment: Judgment normal.      Lab Results  Component Value Date   WBC 6.0 04/20/2020   HGB 14.2 04/20/2020   HCT 42.3 04/20/2020   MCV 91.2 04/20/2020   PLT 226 04/20/2020     Chemistry      Component Value Date/Time   NA 136 04/20/2020 1012   K 4.1 04/20/2020 1012   CL 103  04/20/2020 1012   CO2 27 04/20/2020 1012   BUN 12 04/20/2020 1012   CREATININE 0.96 04/20/2020 1012   CREATININE 1.15 08/25/2019 1450      Component Value Date/Time   CALCIUM 8.6 (L) 04/20/2020 1012   ALKPHOS 92 04/20/2020 1012   AST 16 04/20/2020 1012   ALT 14 04/20/2020 1012   BILITOT 0.5 04/20/2020 1012      Impression and Plan: Mr. Lenn is a very nice 55 year old white male.  He has metastatic renal cell carcinoma.  For some reason, his disease he clearly favors his bones.  His lungs and liver and lymph nodes all appear to be clean.  I am glad that the quality of life is doing so well for Mr. Springsteen.  Hopefully he will play golf this weekend which is a good sign.  We will not have to do any additional scans probably until June because he is doing well right now.  I will plan to get him back in 1 more month.  So far, I am not seeing any "downside" to his immunotherapy treatments.   3/16/202211:34 AM

## 2020-04-21 LAB — T4: T4, Total: 5.4 ug/dL (ref 4.5–12.0)

## 2020-05-19 ENCOUNTER — Inpatient Hospital Stay: Payer: BC Managed Care – PPO

## 2020-05-19 ENCOUNTER — Encounter: Payer: Self-pay | Admitting: Hematology & Oncology

## 2020-05-19 ENCOUNTER — Other Ambulatory Visit: Payer: Self-pay

## 2020-05-19 ENCOUNTER — Inpatient Hospital Stay (HOSPITAL_BASED_OUTPATIENT_CLINIC_OR_DEPARTMENT_OTHER): Payer: BC Managed Care – PPO | Admitting: Hematology & Oncology

## 2020-05-19 ENCOUNTER — Encounter: Payer: Self-pay | Admitting: *Deleted

## 2020-05-19 ENCOUNTER — Inpatient Hospital Stay: Payer: BC Managed Care – PPO | Attending: Hematology & Oncology

## 2020-05-19 ENCOUNTER — Ambulatory Visit: Payer: BC Managed Care – PPO

## 2020-05-19 VITALS — BP 161/77 | HR 85 | Temp 98.2°F | Resp 18 | Wt 210.0 lb

## 2020-05-19 DIAGNOSIS — Z7982 Long term (current) use of aspirin: Secondary | ICD-10-CM | POA: Diagnosis not present

## 2020-05-19 DIAGNOSIS — C642 Malignant neoplasm of left kidney, except renal pelvis: Secondary | ICD-10-CM

## 2020-05-19 DIAGNOSIS — Z5112 Encounter for antineoplastic immunotherapy: Secondary | ICD-10-CM | POA: Diagnosis not present

## 2020-05-19 DIAGNOSIS — C7951 Secondary malignant neoplasm of bone: Secondary | ICD-10-CM | POA: Insufficient documentation

## 2020-05-19 DIAGNOSIS — Z9189 Other specified personal risk factors, not elsewhere classified: Secondary | ICD-10-CM

## 2020-05-19 DIAGNOSIS — G8929 Other chronic pain: Secondary | ICD-10-CM | POA: Insufficient documentation

## 2020-05-19 LAB — CMP (CANCER CENTER ONLY)
ALT: 14 U/L (ref 0–44)
AST: 18 U/L (ref 15–41)
Albumin: 4.2 g/dL (ref 3.5–5.0)
Alkaline Phosphatase: 99 U/L (ref 38–126)
Anion gap: 6 (ref 5–15)
BUN: 19 mg/dL (ref 6–20)
CO2: 26 mmol/L (ref 22–32)
Calcium: 9.4 mg/dL (ref 8.9–10.3)
Chloride: 103 mmol/L (ref 98–111)
Creatinine: 0.94 mg/dL (ref 0.61–1.24)
GFR, Estimated: >60 mL/min (ref >60)
Glucose, Bld: 112 mg/dL — ABNORMAL HIGH (ref 70–99)
Potassium: 4.3 mmol/L (ref 3.5–5.1)
Sodium: 135 mmol/L (ref 135–145)
Total Bilirubin: 0.5 mg/dL (ref 0.3–1.2)
Total Protein: 6.4 g/dL — ABNORMAL LOW (ref 6.5–8.1)

## 2020-05-19 LAB — CBC WITH DIFFERENTIAL (CANCER CENTER ONLY)
Abs Immature Granulocytes: 0.05 10*3/uL (ref 0.00–0.07)
Basophils Absolute: 0.1 10*3/uL (ref 0.0–0.1)
Basophils Relative: 1 %
Eosinophils Absolute: 0.4 10*3/uL (ref 0.0–0.5)
Eosinophils Relative: 5 %
HCT: 45 % (ref 39.0–52.0)
Hemoglobin: 15.5 g/dL (ref 13.0–17.0)
Immature Granulocytes: 1 %
Lymphocytes Relative: 12 %
Lymphs Abs: 0.9 10*3/uL (ref 0.7–4.0)
MCH: 31.5 pg (ref 26.0–34.0)
MCHC: 34.4 g/dL (ref 30.0–36.0)
MCV: 91.5 fL (ref 80.0–100.0)
Monocytes Absolute: 0.5 10*3/uL (ref 0.1–1.0)
Monocytes Relative: 7 %
Neutro Abs: 5.7 10*3/uL (ref 1.7–7.7)
Neutrophils Relative %: 74 %
Platelet Count: 217 10*3/uL (ref 150–400)
RBC: 4.92 MIL/uL (ref 4.22–5.81)
RDW: 14.2 % (ref 11.5–15.5)
WBC Count: 7.6 10*3/uL (ref 4.0–10.5)
nRBC: 0 % (ref 0.0–0.2)

## 2020-05-19 LAB — LACTATE DEHYDROGENASE: LDH: 153 U/L (ref 98–192)

## 2020-05-19 MED ORDER — SODIUM CHLORIDE 0.9% FLUSH
10.0000 mL | INTRAVENOUS | Status: AC | PRN
  Administered 2020-05-19: 10 mL
  Filled 2020-05-19: qty 10

## 2020-05-19 MED ORDER — DENOSUMAB 120 MG/1.7ML ~~LOC~~ SOLN
120.0000 mg | Freq: Once | SUBCUTANEOUS | Status: AC
  Administered 2020-05-19: 120 mg via SUBCUTANEOUS

## 2020-05-19 MED ORDER — HEPARIN SOD (PORK) LOCK FLUSH 100 UNIT/ML IV SOLN
500.0000 [IU] | Freq: Once | INTRAVENOUS | Status: AC | PRN
  Administered 2020-05-19: 500 [IU]
  Filled 2020-05-19: qty 5

## 2020-05-19 MED ORDER — SODIUM CHLORIDE 0.9 % IV SOLN
Freq: Once | INTRAVENOUS | Status: AC
Start: 2020-05-19 — End: 2020-05-19
  Filled 2020-05-19: qty 250

## 2020-05-19 MED ORDER — DENOSUMAB 120 MG/1.7ML ~~LOC~~ SOLN
SUBCUTANEOUS | Status: AC
  Filled 2020-05-19: qty 1.7

## 2020-05-19 MED ORDER — SODIUM CHLORIDE 0.9 % IV SOLN
480.0000 mg | Freq: Once | INTRAVENOUS | Status: AC
  Administered 2020-05-19: 480 mg via INTRAVENOUS
  Filled 2020-05-19: qty 48

## 2020-05-19 NOTE — Addendum Note (Signed)
Addended by: Shelda Altes on: 05/19/2020 01:49 PM   Modules accepted: Orders

## 2020-05-19 NOTE — Patient Instructions (Signed)

## 2020-05-19 NOTE — Progress Notes (Signed)
Hematology and Oncology Follow Up Visit  Vincent David [id number] March 08, 1965 55 y.o. 05/19/2020   Principle Diagnosis:   Metastatic Renal Cell Carcinoma - bone mets  Current Therapy:    Nivoumab/Ipilimumab -- s/p cycle #3 -- started on 09/29/2019 -- maintenance Nivolumab q month -- Started on 12/29/2019  Xgeva 120 mg sq q 3 months -- next dose in 07/2020  XRT to right iliac mass/ L5 vertebral body/ Left femoral neck  LEFT hip repair     Interim History:  Vincent David is back for follow-up.  He looks quite good.  He is actually doing some part-time work.  He works on a golf course.  I think he works once a week.  He is trying exercise 3 times a week.  He is doing his best to try to stay in shape so that he will not hurt as much.  He wants to try to get his muscles as tone as possible to help support his bones.  He has had no problems with diarrhea.  He has had no problems with nausea or vomiting.  He has had no cough or shortness of breath.  He has had no leg swelling.  There is been no issues with rashes.  Currently, his pain is under fairly good control.  He is on a good regimen for chronic pain.  Overall, his performance status is ECOG 1.    Medications:  Current Outpatient Medications:  .  aspirin EC 81 MG tablet, Take 1 tablet (81 mg total) by mouth 2 (two) times daily., Disp: 60 tablet, Rfl: 0 .  lamoTRIgine (LAMICTAL) 100 MG tablet, TAKE 1 TABLET BY MOUTH TWICE A DAY, Disp: 180 tablet, Rfl: 1 .  nicotine (NICODERM CQ - DOSED IN MG/24 HR) 7 mg/24hr patch, Place 1 patch (7 mg total) onto the skin daily., Disp: 28 patch, Rfl: 0 .  oxyCODONE (OXYCONTIN) 15 mg 12 hr tablet, Take 1 tablet (15 mg total) by mouth every 12 (twelve) hours., Disp: 60 tablet, Rfl: 0 .  Oxycodone HCl 10 MG TABS, Take 1 tablet (10 mg total) by mouth every 4 (four) hours as needed., Disp: 90 tablet, Rfl: 0 .  temazepam (RESTORIL) 15 MG capsule, Take 1 capsule (15 mg total) by mouth at bedtime as needed for  sleep., Disp: 30 capsule, Rfl: 0  Allergies: No Known Allergies  Past Medical History, Surgical history, Social history, and Family History were reviewed and updated.  Review of Systems: Review of Systems  Constitutional: Negative.   HENT:  Negative.   Eyes: Negative.   Respiratory: Negative.   Cardiovascular: Negative.   Gastrointestinal: Negative.   Endocrine: Negative.   Musculoskeletal: Positive for back pain and flank pain.  Skin: Negative.   Neurological: Negative.   Hematological: Negative.   Psychiatric/Behavioral: Negative.     Physical Exam:  weight is 210 lb (95.3 kg). His oral temperature is 98.2 F (36.8 C). His blood pressure is 161/77 (abnormal) and his pulse is 85. His respiration is 18 and oxygen saturation is 97%.   Wt Readings from Last 3 Encounters:  05/19/20 210 lb (95.3 kg)  03/24/20 212 lb (96.2 kg)  02/25/20 209 lb (94.8 kg)    Physical Exam Vitals reviewed.  HENT:     Head: Normocephalic and atraumatic.  Eyes:     Pupils: Pupils are equal, round, and reactive to light.  Cardiovascular:     Rate and Rhythm: Normal rate and regular rhythm.     Heart sounds: Normal heart sounds.  Pulmonary:     Effort: Pulmonary effort is normal.     Breath sounds: Normal breath sounds.  Abdominal:     General: Bowel sounds are normal.     Palpations: Abdomen is soft.  Musculoskeletal:        General: No tenderness or deformity. Normal range of motion.     Cervical back: Normal range of motion.  Lymphadenopathy:     Cervical: No cervical adenopathy.  Skin:    General: Skin is warm and dry.     Findings: No erythema or rash.  Neurological:     Mental Status: He is alert and oriented to person, place, and time.  Psychiatric:        Behavior: Behavior normal.        Thought Content: Thought content normal.        Judgment: Judgment normal.      Lab Results  Component Value Date   WBC 7.6 05/19/2020   HGB 15.5 05/19/2020   HCT 45.0 05/19/2020    MCV 91.5 05/19/2020   PLT 217 05/19/2020     Chemistry      Component Value Date/Time   NA 135 05/19/2020 1101   K 4.3 05/19/2020 1101   CL 103 05/19/2020 1101   CO2 26 05/19/2020 1101   BUN 19 05/19/2020 1101   CREATININE 0.94 05/19/2020 1101   CREATININE 1.15 08/25/2019 1450      Component Value Date/Time   CALCIUM 9.4 05/19/2020 1101   ALKPHOS 99 05/19/2020 1101   AST 18 05/19/2020 1101   ALT 14 05/19/2020 1101   BILITOT 0.5 05/19/2020 1101      Impression and Plan: Vincent David is a very nice 55 year old white male.  He has metastatic renal cell carcinoma.  For some reason, his disease clearly favors his bones.  His lungs and liver and lymph nodes all appear to be clean.  I am glad that the quality of life is doing so well for Vincent David.  Hopefully he will play golf this weekend which is a good sign.  We will not have to do any additional scans probably until June because he is doing well right now.  I will plan to get him back in 1 more month.  So far, I am not seeing any "downside" to his immunotherapy treatments.   4/14/202212:14 PM

## 2020-05-19 NOTE — Addendum Note (Signed)
Addended by: Shelda Altes on: 05/19/2020 12:55 PM   Modules accepted: Orders

## 2020-05-19 NOTE — Patient Instructions (Signed)
Nivolumab injection What is this medicine? NIVOLUMAB (nye VOL ue mab) is a monoclonal antibody. It treats certain types of cancer. Some of the cancers treated are colon cancer, head and neck cancer, Hodgkin lymphoma, lung cancer, and melanoma. This medicine may be used for other purposes; ask your health care provider or pharmacist if you have questions. COMMON BRAND NAME(S): Opdivo What should I tell my health care provider before I take this medicine? They need to know if you have any of these conditions:  autoimmune diseases like Crohn's disease, ulcerative colitis, or lupus  have had or planning to have an allogeneic stem cell transplant (uses someone else's stem cells)  history of chest radiation  history of organ transplant  nervous system problems like myasthenia gravis or Guillain-Barre syndrome  an unusual or allergic reaction to nivolumab, other medicines, foods, dyes, or preservatives  pregnant or trying to get pregnant  breast-feeding How should I use this medicine? This medicine is for infusion into a vein. It is given by a health care professional in a hospital or clinic setting. A special MedGuide will be given to you before each treatment. Be sure to read this information carefully each time. Talk to your pediatrician regarding the use of this medicine in children. While this drug may be prescribed for children as young as 12 years for selected conditions, precautions do apply. Overdosage: If you think you have taken too much of this medicine contact a poison control center or emergency room at once. NOTE: This medicine is only for you. Do not share this medicine with others. What if I miss a dose? It is important not to miss your dose. Call your doctor or health care professional if you are unable to keep an appointment. What may interact with this medicine? Interactions have not been studied. This list may not describe all possible interactions. Give your health  care provider a list of all the medicines, herbs, non-prescription drugs, or dietary supplements you use. Also tell them if you smoke, drink alcohol, or use illegal drugs. Some items may interact with your medicine. What should I watch for while using this medicine? This drug may make you feel generally unwell. Continue your course of treatment even though you feel ill unless your doctor tells you to stop. You may need blood work done while you are taking this medicine. Do not become pregnant while taking this medicine or for 5 months after stopping it. Women should inform their doctor if they wish to become pregnant or think they might be pregnant. There is a potential for serious side effects to an unborn child. Talk to your health care professional or pharmacist for more information. Do not breast-feed an infant while taking this medicine or for 5 months after stopping it. What side effects may I notice from receiving this medicine? Side effects that you should report to your doctor or health care professional as soon as possible:  allergic reactions like skin rash, itching or hives, swelling of the face, lips, or tongue  breathing problems  blood in the urine  bloody or watery diarrhea or black, tarry stools  changes in emotions or moods  changes in vision  chest pain  cough  dizziness  feeling faint or lightheaded, falls  fever, chills  headache with fever, neck stiffness, confusion, loss of memory, sensitivity to light, hallucination, loss of contact with reality, or seizures  joint pain  mouth sores  redness, blistering, peeling or loosening of the skin, including inside the   mouth  severe muscle pain or weakness  signs and symptoms of high blood sugar such as dizziness; dry mouth; dry skin; fruity breath; nausea; stomach pain; increased hunger or thirst; increased urination  signs and symptoms of kidney injury like trouble passing urine or change in the amount of  urine  signs and symptoms of liver injury like dark yellow or brown urine; general ill feeling or flu-like symptoms; light-colored stools; loss of appetite; nausea; right upper belly pain; unusually weak or tired; yellowing of the eyes or skin  swelling of the ankles, feet, hands  trouble passing urine or change in the amount of urine  unusually weak or tired  weight gain or loss Side effects that usually do not require medical attention (report to your doctor or health care professional if they continue or are bothersome):  bone pain  constipation  decreased appetite  diarrhea  muscle pain  nausea, vomiting  tiredness This list may not describe all possible side effects. Call your doctor for medical advice about side effects. You may report side effects to FDA at 1-800-FDA-1088. Where should I keep my medicine? This drug is given in a hospital or clinic and will not be stored at home. NOTE: This sheet is a summary. It may not cover all possible information. If you have questions about this medicine, talk to your doctor, pharmacist, or health care provider.  2021 Elsevier/Gold Standard (2019-05-27 10:08:25)  

## 2020-05-19 NOTE — Progress Notes (Signed)
Oncology Nurse Navigator Documentation  Oncology Nurse Navigator Flowsheets 05/19/2020  Abnormal Finding Date -  Confirmed Diagnosis Date -  Diagnosis Status -  Planned Course of Treatment -  Phase of Treatment -  Chemotherapy Pending- Reason: -  Chemotherapy Actual Start Date: -  Navigator Follow Up Date: -  Navigator Follow Up Reason: -  Navigation Complete Date: 05/19/2020  Post Navigation: Continue to Follow Patient? No  Reason Not Navigating Patient: Patient On Maintenance Chemotherapy  Navigator Location [facility]  Referral Date to RadOnc/MedOnc -  Navigator Encounter Type Appt/Treatment Plan Review  Telephone -  Treatment Initiated Date 09/29/2019  Patient Visit Type MedOnc  Treatment Phase Active Tx  Barriers/Navigation Needs No Barriers At This Time  Education -  Interventions None Required  Acuity Level 1-No Barriers  Referrals -  Coordination of Care -  Education Method -  Support Groups/Services Friends and Family  Time Spent with Patient 15

## 2020-05-20 LAB — TSH: TSH: 1.387 u[IU]/mL (ref 0.320–4.118)

## 2020-05-20 LAB — T4: T4, Total: 5.7 ug/dL (ref 4.5–12.0)

## 2020-06-15 ENCOUNTER — Other Ambulatory Visit: Payer: Self-pay

## 2020-06-15 ENCOUNTER — Inpatient Hospital Stay: Payer: BC Managed Care – PPO

## 2020-06-15 ENCOUNTER — Inpatient Hospital Stay (HOSPITAL_BASED_OUTPATIENT_CLINIC_OR_DEPARTMENT_OTHER): Payer: BC Managed Care – PPO | Admitting: Hematology & Oncology

## 2020-06-15 ENCOUNTER — Encounter: Payer: Self-pay | Admitting: Hematology & Oncology

## 2020-06-15 ENCOUNTER — Inpatient Hospital Stay: Payer: BC Managed Care – PPO | Attending: Hematology & Oncology

## 2020-06-15 ENCOUNTER — Telehealth: Payer: Self-pay

## 2020-06-15 ENCOUNTER — Other Ambulatory Visit (HOSPITAL_BASED_OUTPATIENT_CLINIC_OR_DEPARTMENT_OTHER): Payer: Self-pay

## 2020-06-15 VITALS — BP 157/70 | HR 84

## 2020-06-15 VITALS — BP 166/82 | HR 86 | Temp 99.0°F | Resp 18 | Wt 210.0 lb

## 2020-06-15 DIAGNOSIS — Z7982 Long term (current) use of aspirin: Secondary | ICD-10-CM | POA: Insufficient documentation

## 2020-06-15 DIAGNOSIS — C642 Malignant neoplasm of left kidney, except renal pelvis: Secondary | ICD-10-CM

## 2020-06-15 DIAGNOSIS — Z5112 Encounter for antineoplastic immunotherapy: Secondary | ICD-10-CM | POA: Diagnosis not present

## 2020-06-15 DIAGNOSIS — Z79899 Other long term (current) drug therapy: Secondary | ICD-10-CM | POA: Insufficient documentation

## 2020-06-15 DIAGNOSIS — G893 Neoplasm related pain (acute) (chronic): Secondary | ICD-10-CM | POA: Diagnosis not present

## 2020-06-15 DIAGNOSIS — C7951 Secondary malignant neoplasm of bone: Secondary | ICD-10-CM | POA: Insufficient documentation

## 2020-06-15 DIAGNOSIS — Z79891 Long term (current) use of opiate analgesic: Secondary | ICD-10-CM | POA: Insufficient documentation

## 2020-06-15 LAB — CMP (CANCER CENTER ONLY)
ALT: 12 U/L (ref 0–44)
AST: 13 U/L — ABNORMAL LOW (ref 15–41)
Albumin: 4.1 g/dL (ref 3.5–5.0)
Alkaline Phosphatase: 109 U/L (ref 38–126)
Anion gap: 7 (ref 5–15)
BUN: 13 mg/dL (ref 6–20)
CO2: 28 mmol/L (ref 22–32)
Calcium: 9.3 mg/dL (ref 8.9–10.3)
Chloride: 101 mmol/L (ref 98–111)
Creatinine: 1.02 mg/dL (ref 0.61–1.24)
GFR, Estimated: >60 mL/min (ref >60)
Glucose, Bld: 135 mg/dL — ABNORMAL HIGH (ref 70–99)
Potassium: 4 mmol/L (ref 3.5–5.1)
Sodium: 136 mmol/L (ref 135–145)
Total Bilirubin: 0.4 mg/dL (ref 0.3–1.2)
Total Protein: 6.3 g/dL — ABNORMAL LOW (ref 6.5–8.1)

## 2020-06-15 LAB — CBC WITH DIFFERENTIAL (CANCER CENTER ONLY)
Abs Immature Granulocytes: 0.03 10*3/uL (ref 0.00–0.07)
Basophils Absolute: 0.1 10*3/uL (ref 0.0–0.1)
Basophils Relative: 1 %
Eosinophils Absolute: 0.3 10*3/uL (ref 0.0–0.5)
Eosinophils Relative: 4 %
HCT: 44.6 % (ref 39.0–52.0)
Hemoglobin: 15.4 g/dL (ref 13.0–17.0)
Immature Granulocytes: 0 %
Lymphocytes Relative: 11 %
Lymphs Abs: 0.9 10*3/uL (ref 0.7–4.0)
MCH: 31.6 pg (ref 26.0–34.0)
MCHC: 34.5 g/dL (ref 30.0–36.0)
MCV: 91.4 fL (ref 80.0–100.0)
Monocytes Absolute: 0.4 10*3/uL (ref 0.1–1.0)
Monocytes Relative: 6 %
Neutro Abs: 5.9 10*3/uL (ref 1.7–7.7)
Neutrophils Relative %: 78 %
Platelet Count: 212 10*3/uL (ref 150–400)
RBC: 4.88 MIL/uL (ref 4.22–5.81)
RDW: 13.7 % (ref 11.5–15.5)
WBC Count: 7.6 10*3/uL (ref 4.0–10.5)
nRBC: 0 % (ref 0.0–0.2)

## 2020-06-15 LAB — LACTATE DEHYDROGENASE: LDH: 152 U/L (ref 98–192)

## 2020-06-15 MED ORDER — OXYCODONE HCL 10 MG PO TABS
10.0000 mg | ORAL_TABLET | ORAL | 0 refills | Status: DC | PRN
  Filled 2020-06-15 (×2): qty 90, 15d supply, fill #0

## 2020-06-15 MED ORDER — SODIUM CHLORIDE 0.9% FLUSH
10.0000 mL | INTRAVENOUS | Status: DC | PRN
  Administered 2020-06-15: 10 mL
  Filled 2020-06-15: qty 10

## 2020-06-15 MED ORDER — SODIUM CHLORIDE 0.9 % IV SOLN
480.0000 mg | Freq: Once | INTRAVENOUS | Status: AC
  Administered 2020-06-15: 480 mg via INTRAVENOUS
  Filled 2020-06-15: qty 48

## 2020-06-15 MED ORDER — OXYCODONE HCL ER 15 MG PO T12A
15.0000 mg | EXTENDED_RELEASE_TABLET | Freq: Two times a day (BID) | ORAL | 0 refills | Status: DC
  Filled 2020-06-15: qty 60, 30d supply, fill #0

## 2020-06-15 MED ORDER — HEPARIN SOD (PORK) LOCK FLUSH 100 UNIT/ML IV SOLN
500.0000 [IU] | Freq: Once | INTRAVENOUS | Status: AC | PRN
  Administered 2020-06-15: 500 [IU]
  Filled 2020-06-15: qty 5

## 2020-06-15 MED ORDER — SODIUM CHLORIDE 0.9 % IV SOLN
Freq: Once | INTRAVENOUS | Status: AC
  Filled 2020-06-15: qty 250

## 2020-06-15 NOTE — Progress Notes (Signed)
Hematology and Oncology Follow Up Visit  Vincent David [id number] 05/22/1965 55 y.o. 06/15/2020   Principle Diagnosis:   Metastatic Renal Cell Carcinoma - bone mets  Current Therapy:    Nivoumab/Ipilimumab -- s/p cycle #3 -- started on 09/29/2019 -- maintenance Nivolumab q month -- Started on 12/29/2019  Xgeva 120 mg sq q 3 months -- next dose in 07/2020  XRT to right iliac mass/ L5 vertebral body/ Left femoral neck  LEFT hip repair     Interim History:  Vincent David is back for follow-up.  Vincent David is doing pretty well.  Vincent David really has had no specific complaints.  His pain is well controlled on OxyContin and oxycodone.  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  Vincent David enjoys this.  Vincent David says that it keeps him busy.  Been having some tooth pain.  This is in the upper maxillary area.  We really have to get him into see his dentist.  Given that Vincent David is on Xgeva, we need to make sure there is no bony changes that would prevent [place] from continuing Xgeva.  Vincent David has had no change in bowel or bladder habits.  There has been no issues with diarrhea.  Vincent David has had no problems with cough or shortness of breath.  Vincent David has had no rashes.  Vincent David has had no leg swelling.  Overall, his performance status is ECOG 1.    Medications:  Current Outpatient Medications:  .  aspirin EC 81 MG tablet, Take 1 tablet (81 mg total) by mouth 2 (two) times daily., Disp: 60 tablet, Rfl: 0 .  lamoTRIgine (LAMICTAL) 100 MG tablet, TAKE 1 TABLET BY MOUTH TWICE A DAY, Disp: 180 tablet, Rfl: 1 .  nicotine (NICODERM CQ - DOSED IN MG/24 HR) 7 mg/24hr patch, Place 1 patch (7 mg total) onto the skin daily., Disp: 28 patch, Rfl: 0 .  oxyCODONE (OXYCONTIN) 15 mg 12 hr tablet, Take 1 tablet (15 mg total) by mouth every 12 (twelve) hours., Disp: 60 tablet, Rfl: 0 .  Oxycodone HCl 10 MG TABS, Take 1 tablet (10 mg total) by mouth every 4 (four) hours as needed., Disp: 90 tablet, Rfl: 0 .  temazepam (RESTORIL) 15 MG capsule, Take 1 capsule (15 mg total) by  mouth at bedtime as needed for sleep., Disp: 30 capsule, Rfl: 0 No current facility-administered medications for this visit.  Facility-Administered Medications Ordered in Other Visits:  .  sodium chloride flush (NS) 0.9 % injection 10 mL, 10 mL, Intracatheter, PRN, Volanda Napoleon, MD, 10 mL at 05/19/20 1343  Allergies: No Known Allergies  Past Medical History, Surgical history, Social history, and Family History were reviewed and updated.  Review of Systems: Review of Systems  Constitutional: Negative.   HENT:  Negative.   Eyes: Negative.   Respiratory: Negative.   Cardiovascular: Negative.   Gastrointestinal: Negative.   Endocrine: Negative.   Musculoskeletal: Positive for back pain and flank pain.  Skin: Negative.   Neurological: Negative.   Hematological: Negative.   Psychiatric/Behavioral: Negative.     Physical Exam:  weight is 210 lb (95.3 kg). His oral temperature is 99 F (37.2 C). His blood pressure is 166/82 (abnormal) and his pulse is 86. His respiration is 18 and oxygen saturation is 99%.   Wt Readings from Last 3 Encounters:  06/15/20 210 lb (95.3 kg)  05/19/20 210 lb (95.3 kg)  03/24/20 212 lb (96.2 kg)    Physical Exam Vitals reviewed.  HENT:     Head: Normocephalic and  atraumatic.  Eyes:     Pupils: Pupils are equal, round, and reactive to light.  Cardiovascular:     Rate and Rhythm: Normal rate and regular rhythm.     Heart sounds: Normal heart sounds.  Pulmonary:     Effort: Pulmonary effort is normal.     Breath sounds: Normal breath sounds.  Abdominal:     General: Bowel sounds are normal.     Palpations: Abdomen is soft.  Musculoskeletal:        General: No tenderness or deformity. Normal range of motion.     Cervical back: Normal range of motion.  Lymphadenopathy:     Cervical: No cervical adenopathy.  Skin:    General: Skin is warm and dry.     Findings: No erythema or rash.  Neurological:     Mental Status: Vincent David is alert and oriented  to person, place, and time.  Psychiatric:        Behavior: Behavior normal.        Thought Content: Thought content normal.        Judgment: Judgment normal.      Lab Results  Component Value Date   WBC 7.6 06/15/2020   HGB 15.4 06/15/2020   HCT 44.6 06/15/2020   MCV 91.4 06/15/2020   PLT 212 06/15/2020     Chemistry      Component Value Date/Time   NA 136 06/15/2020 1010   K 4.0 06/15/2020 1010   CL 101 06/15/2020 1010   CO2 28 06/15/2020 1010   BUN 13 06/15/2020 1010   CREATININE 1.02 06/15/2020 1010   CREATININE 1.15 08/25/2019 1450      Component Value Date/Time   CALCIUM 9.3 06/15/2020 1010   ALKPHOS 109 06/15/2020 1010   AST 13 (L) 06/15/2020 1010   ALT 12 06/15/2020 1010   BILITOT 0.4 06/15/2020 1010      Impression and Plan: Vincent David is a very nice 55 year old white male.  Vincent David has metastatic renal cell carcinoma.  For some reason, his disease clearly favors his bones.  His lungs and liver and lymph nodes all appear to be clean.  We will set him up with a PET scan before his see him back in June.  His last scan was back in February.  Hopefully, we will see that Vincent David is still responding.  I would have to think that Vincent David is still responding.  We do have to sort out what is going on with his mouth.  Hopefully his dentist can help [place] out.  Hopefully is not developing any osteonecrosis or similar issues.  We will plan for a follow-up in 4 weeks.  Of note, his son is coming up for a while.  Vincent David has been living in [place], [place].  I am sure that Vincent David and his wife will enjoy his stay.  5/11/202211:07 AM

## 2020-06-15 NOTE — Patient Instructions (Signed)
[facility]  Discharge Instructions: Thank you for choosing [facility] to provide your oncology and hematology care.   If you have a lab appointment with the Cancer Center, please go directly to the Cancer Center and check in at the registration area.  Wear comfortable clothing and clothing appropriate for easy access to any Portacath or PICC line.   We strive to give you quality time with your provider. You may need to reschedule your appointment if you arrive late (15 or more minutes).  Arriving late affects you and other patients whose appointments are after yours.  Also, if you miss three or more appointments without notifying the office, you may be dismissed from the clinic at the provider's discretion.      For prescription refill requests, have your pharmacy contact our office and allow 72 hours for refills to be completed.    Today you received the following chemotherapy and/or immunotherapy agents:  Opdivo      To help prevent nausea and vomiting after your treatment, we encourage you to take your nausea medication as directed.  BELOW ARE SYMPTOMS THAT SHOULD BE REPORTED IMMEDIATELY: *FEVER GREATER THAN 100.4 F (38 C) OR HIGHER *CHILLS OR SWEATING *NAUSEA AND VOMITING THAT IS NOT CONTROLLED WITH YOUR NAUSEA MEDICATION *UNUSUAL SHORTNESS OF BREATH *UNUSUAL BRUISING OR BLEEDING *URINARY PROBLEMS (pain or burning when urinating, or frequent urination) *BOWEL PROBLEMS (unusual diarrhea, constipation, pain near the anus) TENDERNESS IN MOUTH AND THROAT WITH OR WITHOUT PRESENCE OF ULCERS (sore throat, sores in mouth, or a toothache) UNUSUAL RASH, SWELLING OR PAIN  UNUSUAL VAGINAL DISCHARGE OR ITCHING   Items with * indicate a potential emergency and should be followed up as soon as possible or go to the Emergency Department if any problems should occur.  Please show the CHEMOTHERAPY ALERT CARD or IMMUNOTHERAPY ALERT CARD at check-in to the  Emergency Department and triage nurse. Should you have questions after your visit or need to cancel or reschedule your appointment, please contact [facility]  [phone] and follow the prompts.  Office hours are 8:00 a.m. to 4:30 p.m. Monday - Friday. Please note that voicemails left after 4:00 p.m. may not be returned until the following business day.  We are closed weekends and major holidays. You have access to a nurse at all times for urgent questions. Please call the main number to the clinic [phone] and follow the prompts.  For any non-urgent questions, you may also contact your provider using MyChart. We now offer e-Visits for anyone 18 and older to request care online for non-urgent symptoms. For details visit [email or web address].   Also download the MyChart app! Go to the app store, search "MyChart", open the app, select [facility], and log in with your MyChart username and password.  Due to Covid, a mask is required upon entering the hospital/clinic. If you do not have a mask, one will be given to you upon arrival. For doctor visits, patients may have 1 support person aged 18 or older with them. For treatment visits, patients cannot have anyone with them due to current Covid guidelines and our immunocompromised population.  

## 2020-06-15 NOTE — Telephone Encounter (Signed)
appts made per 06/15/20 los and pt will gain a sch in tx/avs and through [employer]

## 2020-06-15 NOTE — Patient Instructions (Signed)

## 2020-06-16 LAB — T4: T4, Total: 5.2 ug/dL (ref 4.5–12.0)

## 2020-06-16 LAB — TSH: TSH: 1.912 u[IU]/mL (ref 0.320–4.118)

## 2020-06-21 ENCOUNTER — Other Ambulatory Visit (HOSPITAL_BASED_OUTPATIENT_CLINIC_OR_DEPARTMENT_OTHER): Payer: Self-pay

## 2020-06-21 ENCOUNTER — Encounter: Payer: Self-pay | Admitting: Hematology & Oncology

## 2020-06-24 ENCOUNTER — Other Ambulatory Visit (HOSPITAL_BASED_OUTPATIENT_CLINIC_OR_DEPARTMENT_OTHER): Payer: Self-pay

## 2020-06-27 ENCOUNTER — Other Ambulatory Visit (HOSPITAL_BASED_OUTPATIENT_CLINIC_OR_DEPARTMENT_OTHER): Payer: Self-pay

## 2020-06-27 ENCOUNTER — Other Ambulatory Visit: Payer: Self-pay | Admitting: Family

## 2020-07-01 ENCOUNTER — Telehealth: Payer: Self-pay | Admitting: *Deleted

## 2020-07-01 NOTE — Telephone Encounter (Signed)
Peer to peer for pt regarding PET Scan case #[id number]  Call transferred to MD for approval/discussion

## 2020-07-02 ENCOUNTER — Encounter: Payer: Self-pay | Admitting: Hematology & Oncology

## 2020-07-05 ENCOUNTER — Encounter: Payer: Self-pay | Admitting: *Deleted

## 2020-07-05 NOTE — Progress Notes (Signed)
Received a call from the patient's wife, Otila Kluver. There is some confusion regarding the PET scan scheduled this Thursday. This scan was denied by insurance, but Dr Marin Olp was able to get this reversed on appeal.   Confirmed with Otila Kluver that the PET is approved and for him to have the scan as scheduled. She appreciated the information.   Oncology Nurse Navigator Documentation  Oncology Nurse Navigator Flowsheets 07/05/2020  Abnormal Finding Date -  Confirmed Diagnosis Date -  Diagnosis Status -  Planned Course of Treatment -  Phase of Treatment -  Chemotherapy Pending- Reason: -  Chemotherapy Actual Start Date: -  Navigator Follow Up Date: -  Navigator Follow Up Reason: -  Navigation Complete Date: -  Post Navigation: Continue to Follow Patient? -  Reason Not Navigating Patient: -  Financial planner  Referral Date to RadOnc/MedOnc -  Navigator Encounter Type Appt/Treatment Plan Review  Telephone Incoming Call  Treatment Initiated Date -  Patient Visit Type -  Treatment Phase -  Barriers/Navigation Needs Coordination of Care  Education Other  Interventions Coordination of Care  Acuity Level 2-Minimal Needs (1-2 Barriers Identified)  Referrals -  Coordination of Care Radiology  Education Method Verbal  Support Groups/Services Friends and Family  Time Spent with Patient 15

## 2020-07-06 ENCOUNTER — Ambulatory Visit (INDEPENDENT_AMBULATORY_CARE_PROVIDER_SITE_OTHER): Payer: BC Managed Care – PPO | Admitting: Adult Health

## 2020-07-06 ENCOUNTER — Encounter: Payer: Self-pay | Admitting: Adult Health

## 2020-07-06 VITALS — BP 156/82 | HR 74 | Ht 67.0 in | Wt 213.0 lb

## 2020-07-06 DIAGNOSIS — Z9989 Dependence on other enabling machines and devices: Secondary | ICD-10-CM | POA: Diagnosis not present

## 2020-07-06 DIAGNOSIS — R569 Unspecified convulsions: Secondary | ICD-10-CM | POA: Diagnosis not present

## 2020-07-06 DIAGNOSIS — G4733 Obstructive sleep apnea (adult) (pediatric): Secondary | ICD-10-CM | POA: Diagnosis not present

## 2020-07-06 NOTE — Patient Instructions (Signed)
Your Plan:  Continue lamictal 100 mg twice a day Continue CPAP If your symptoms worsen or you develop new symptoms please let us know.    Thank you for coming to see [place] at [facility]. I hope we have been able to provide you high quality care today.  You may receive a patient satisfaction survey over the next few weeks. We would appreciate your feedback and comments so that we may continue to improve ourselves and the health of our patients.

## 2020-07-06 NOTE — Progress Notes (Signed)
PATIENT: Vincent David DOB: 06-01-1965  REASON FOR VISIT: follow up HISTORY FROM: patient  HISTORY OF PRESENT ILLNESS: Today 07/06/20:  Vincent David is a 55 year old male with a history of OSA on CPAP.  He returns today for follow-up.  Reports that the CPAP is working well.  He denies any seizure-like activity.  Continues on Lamictal 100 mg twice a day.  Denies any changes with his gait or balance.  He returns today for an evaluation.    01/06/20: Vincent David is a 55 year old male with a history of nocturnal seizures and obstructive sleep apnea on CPAP he returns today for follow-up.   His CPAP download indicates that he uses machine nightly for compliance of 100%.  He uses machine greater than 4 hours each night.  On average he uses his machine 9 hours and 14 minutes.  His residual AHI is 1.6 on 6 to 15 cm water with EPR of 3.  He reports that the CPAP works well for him.  He does note that some nights he does not sleep well.  Reports that he has a jittery sensation in his knees.  This started after his cancer treatment.  The patient currently is on Lamictal 100 mg twice a day.  He denies any seizure-like events since his visit in July.  He states that sometimes he has an event during the day where his wife reports that he has comprehensible speech.  He states that this only last for several seconds.  He does not feel that it is seizure related.  He feels that his blood sugar may drop.  He never has checked his blood sugar to confirm this.  He also states that it may be related to his pain medication.  He returns today for an evaluation.  To note: he has had an EMU admission in May but did not show epileptic events.  He has had normal EEGs in the past.  It is questionable whether these events are epileptic or nonepileptic.  HISTORY (Copied from Dr.Dohmeier's note)  09/03/2019 in a RV.  Vincent David had last been seen in April in this office, at the time we made a decision to wean him off  Keppra and onto Lamictal as a medication of choice for his nocturnal recurrent seizure-like events.  He was admitted to the epilepsy monitoring unit from the 24th through 27 May for video EEG monitoring his Keppra had been held at the time he was exposed to hyperventilation and photic stimulation, all his seizures has had happened during sleep therefore sleep deprivation was not a planned part of his work-up.  On the contrary we wanted him to sleep.  No typical events were captured patient requested to go home on the 27th as it was difficult for him to sleep in the hospital environment and multiple times employees had entered the epilepsy monitoring room and disturb him.  The diagnosis of nonepileptic events was discussed with the patient Keppra had not substantially change the frequency and therefore a change to lamotrigine have been recommended, he had a normal EEG background rhythm of 10 Hz frontocentral region sleep spindles were noted symmetrically no EEG changes in hyperventilation or photic stimulation no epileptiform discharges were seen during the recording in daytime or at night again the patient felt that he was on independently sleep deprived by the comings and goings on the hospital floor.  After his discharge on the 27th he then suffered a seizure in early July this was after  he had already been off Keppra and on Lamictal.  He is now using 50 mg twice daily, which we can increase 200 mg twice daily and further steps however he has at this time another medical concern.  Unfortunately he was diagnosed with a malignancy a finding that occurred while he was worked up for hip pain.PET scan revealed left sided kidney cancer, spreading to lumbar and hip regions. [name]    REVIEW OF SYSTEMS: Out of a complete 14 system review of symptoms, the patient complains only of the following symptoms, and all other reviewed systems are negative.  ESS 15 FSS10  ALLERGIES: No Known Allergies  HOME  MEDICATIONS: Outpatient Medications Prior to Visit  Medication Sig Dispense Refill  . aspirin EC 81 MG tablet Take 1 tablet (81 mg total) by mouth 2 (two) times daily. 60 tablet 0  . lamoTRIgine (LAMICTAL) 100 MG tablet TAKE 1 TABLET BY MOUTH TWICE A DAY 180 tablet 1  . nicotine (NICODERM CQ - DOSED IN MG/24 HR) 7 mg/24hr patch Place 1 patch (7 mg total) onto the skin daily. 28 patch 0  . oxyCODONE (OXYCONTIN) 15 mg 12 hr tablet Take 1 tablet (15 mg total) by mouth every 12 (twelve) hours. 60 tablet 0  . Oxycodone HCl 10 MG TABS Take 1 tablet (10 mg total) by mouth every 4 (four) hours as needed. 90 tablet 0  . temazepam (RESTORIL) 15 MG capsule Take 1 capsule (15 mg total) by mouth at bedtime as needed for sleep. 30 capsule 0   Facility-Administered Medications Prior to Visit  Medication Dose Route Frequency Provider Last Rate Last Admin  . sodium chloride flush (NS) 0.9 % injection 10 mL  10 mL Intracatheter PRN Volanda Napoleon, MD   10 mL at 05/19/20 1343    PAST MEDICAL HISTORY: Past Medical History:  Diagnosis Date  . Cancer ([place])   . Cancer of kidney ([place])   . Goals of care, counseling/discussion 08/28/2019  . Postictal headache   . Seizure ([place])   . Sleep apnea   . Tinnitus     PAST SURGICAL HISTORY: Past Surgical History:  Procedure Laterality Date  . IR IMAGING GUIDED PORT INSERTION  09/28/2019  . KNEE SURGERY Left   . ORIF PELVIC FRACTURE Left 09/14/2019   Procedure: OPEN REDUCTION INTERNAL FIXATION (ORIF) LEFT HIP WITH AFFIXUS NAIL;  Surgeon: Frederik Pear, MD;  Location: [facility];  Service: Orthopedics;  Laterality: Left;  . SHOULDER SURGERY Left     FAMILY HISTORY: Family History  Problem Relation Age of Onset  . Sleep apnea Mother   . Diabetes Mother   . High blood pressure Father   . Cancer Maternal Uncle        type unknown "either pancreatic or kidney"    SOCIAL HISTORY: Social History   Socioeconomic History  . Marital status: Married    Spouse name:  Otila Kluver  . Number of children: 3  . Years of education: Not on file  . Highest education level: Not on file  Occupational History    Employer: OTHER  Tobacco Use  . Smoking status: Current Every Day Smoker    Packs/day: 0.50    Types: Cigarettes  . Smokeless tobacco: Never Used  . Tobacco comment: using nicotine patch-still smoking.  Vaping Use  . Vaping Use: Never used  Substance and Sexual Activity  . Alcohol use: Yes    Alcohol/week: 6.0 standard drinks    Types: 6 Cans of beer per week  .  Drug use: Never  . Sexual activity: Yes    Partners: Female  Other Topics Concern  . Not on file  Social History Narrative  . Not on file   Social Determinants of Health   Financial Resource Strain: Not on file  Food Insecurity: Not on file  Transportation Needs: Not on file  Physical Activity: Not on file  Stress: Not on file  Social Connections: Not on file  Intimate Partner Violence: Not on file      PHYSICAL EXAM  Vitals:   07/06/20 0848  BP: (!) 156/82  Pulse: 74  Weight: 213 lb (96.6 kg)  Height: 5\' 7"  (1.702 m)   Body mass index is 33.36 kg/m.  Generalized: Well developed, in no acute distress   Neurological examination  Mentation: Alert oriented to time, place, history taking. Follows all commands speech and language fluent Cranial nerve II-XII: Pupils were equal round reactive to light. Extraocular movements were full, visual field were full on confrontational test. . Head turning and shoulder shrug  were normal and symmetric. Motor: The motor testing reveals 5 over 5 strength of all 4 extremities. Good symmetric motor tone is noted throughout.  Sensory: Sensory testing is intact to soft touch on all 4 extremities. No evidence of extinction is noted.  Coordination: Cerebellar testing reveals good finger-nose-finger and heel-to-shin bilaterally.  Gait and station: Gait is normal.  Reflexes: Deep tendon reflexes are symmetric and normal bilaterally.   DIAGNOSTIC  DATA (LABS, IMAGING, TESTING) - I reviewed patient records, labs, notes, testing and imaging myself where available.  Lab Results  Component Value Date   WBC 7.6 06/15/2020   HGB 15.4 06/15/2020   HCT 44.6 06/15/2020   MCV 91.4 06/15/2020   PLT 212 06/15/2020      Component Value Date/Time   NA 136 06/15/2020 1010   K 4.0 06/15/2020 1010   CL 101 06/15/2020 1010   CO2 28 06/15/2020 1010   GLUCOSE 135 (H) 06/15/2020 1010   BUN 13 06/15/2020 1010   CREATININE 1.02 06/15/2020 1010   CREATININE 1.15 08/25/2019 1450   CALCIUM 9.3 06/15/2020 1010   PROT 6.3 (L) 06/15/2020 1010   ALBUMIN 4.1 06/15/2020 1010   AST 13 (L) 06/15/2020 1010   ALT 12 06/15/2020 1010   ALKPHOS 109 06/15/2020 1010   BILITOT 0.4 06/15/2020 1010   GFRNONAA >60 06/15/2020 1010   GFRNONAA 71 08/25/2019 1450   GFRAA >60 10/20/2019 1117   GFRAA 83 08/25/2019 1450   Lab Results  Component Value Date   CHOL 206 (H) 02/04/2019   HDL 34 (L) 02/04/2019   LDLCALC 146 (H) 02/04/2019   TRIG 133 02/04/2019   CHOLHDL 6.1 (H) 02/04/2019    Lab Results  Component Value Date   TSH 1.912 06/15/2020      ASSESSMENT AND PLAN 55 y.o. year old male  has a past medical history of Cancer ([place]), Cancer of kidney ([place]), Goals of care, counseling/discussion (08/28/2019), Postictal headache, Seizure ([place]), Sleep apnea, and Tinnitus. here with:  1.  Seizures questionable nonepileptic  --Continue Lamictal 100 mg twice a day --Lamictal level in December was in normal range --Advised patient to let us know if he has any seizure events  2.  Obstructive sleep apnea on CPAP  --Excellent compliance --Good treatment of apnea --Encouraged to continue using nightly and greater than 4 hours each night   --Follow-up in 1 year or sooner if needed   .  [name], MSN, NP-C 07/06/2020, 9:05 AM [facility] [address], [place], [place] [address] [phone]

## 2020-07-07 ENCOUNTER — Ambulatory Visit (HOSPITAL_COMMUNITY)
Admission: RE | Admit: 2020-07-07 | Discharge: 2020-07-07 | Disposition: A | Discharge status: Home / Self Care | Payer: BC Managed Care – PPO | Source: Ambulatory Visit | Attending: Hematology & Oncology | Admitting: Hematology & Oncology

## 2020-07-07 ENCOUNTER — Other Ambulatory Visit: Payer: Self-pay

## 2020-07-07 DIAGNOSIS — C642 Malignant neoplasm of left kidney, except renal pelvis: Secondary | ICD-10-CM | POA: Diagnosis not present

## 2020-07-07 DIAGNOSIS — C649 Malignant neoplasm of unspecified kidney, except renal pelvis: Secondary | ICD-10-CM | POA: Diagnosis not present

## 2020-07-07 LAB — GLUCOSE, CAPILLARY: Glucose-Capillary: 99 mg/dL (ref 70–99)

## 2020-07-07 MED ORDER — FLUDEOXYGLUCOSE F - 18 (FDG) INJECTION
10.3000 | Freq: Once | INTRAVENOUS | Status: AC | PRN
  Administered 2020-07-07: 10.5 via INTRAVENOUS

## 2020-07-12 ENCOUNTER — Other Ambulatory Visit (HOSPITAL_BASED_OUTPATIENT_CLINIC_OR_DEPARTMENT_OTHER): Payer: Self-pay

## 2020-07-12 ENCOUNTER — Inpatient Hospital Stay (HOSPITAL_BASED_OUTPATIENT_CLINIC_OR_DEPARTMENT_OTHER): Payer: BC Managed Care – PPO | Admitting: Hematology & Oncology

## 2020-07-12 ENCOUNTER — Telehealth: Payer: Self-pay

## 2020-07-12 ENCOUNTER — Inpatient Hospital Stay: Payer: BC Managed Care – PPO

## 2020-07-12 ENCOUNTER — Encounter: Payer: Self-pay | Admitting: Hematology & Oncology

## 2020-07-12 ENCOUNTER — Other Ambulatory Visit: Payer: Self-pay

## 2020-07-12 ENCOUNTER — Inpatient Hospital Stay: Payer: BC Managed Care – PPO | Attending: Hematology & Oncology

## 2020-07-12 VITALS — BP 174/86 | HR 77 | Temp 98.0°F | Resp 17 | Ht 67.0 in | Wt 210.0 lb

## 2020-07-12 DIAGNOSIS — Z79891 Long term (current) use of opiate analgesic: Secondary | ICD-10-CM | POA: Insufficient documentation

## 2020-07-12 DIAGNOSIS — Z7982 Long term (current) use of aspirin: Secondary | ICD-10-CM | POA: Insufficient documentation

## 2020-07-12 DIAGNOSIS — R21 Rash and other nonspecific skin eruption: Secondary | ICD-10-CM | POA: Insufficient documentation

## 2020-07-12 DIAGNOSIS — Z5112 Encounter for antineoplastic immunotherapy: Secondary | ICD-10-CM | POA: Insufficient documentation

## 2020-07-12 DIAGNOSIS — C642 Malignant neoplasm of left kidney, except renal pelvis: Secondary | ICD-10-CM

## 2020-07-12 DIAGNOSIS — C7951 Secondary malignant neoplasm of bone: Secondary | ICD-10-CM | POA: Diagnosis not present

## 2020-07-12 DIAGNOSIS — Z79899 Other long term (current) drug therapy: Secondary | ICD-10-CM | POA: Insufficient documentation

## 2020-07-12 LAB — CBC WITH DIFFERENTIAL (CANCER CENTER ONLY)
Abs Immature Granulocytes: 0.07 10*3/uL (ref 0.00–0.07)
Basophils Absolute: 0 10*3/uL (ref 0.0–0.1)
Basophils Relative: 1 %
Eosinophils Absolute: 0.4 10*3/uL (ref 0.0–0.5)
Eosinophils Relative: 4 %
HCT: 47.1 % (ref 39.0–52.0)
Hemoglobin: 16.1 g/dL (ref 13.0–17.0)
Immature Granulocytes: 1 %
Lymphocytes Relative: 13 %
Lymphs Abs: 1.1 10*3/uL (ref 0.7–4.0)
MCH: 31.4 pg (ref 26.0–34.0)
MCHC: 34.2 g/dL (ref 30.0–36.0)
MCV: 92 fL (ref 80.0–100.0)
Monocytes Absolute: 0.5 10*3/uL (ref 0.1–1.0)
Monocytes Relative: 7 %
Neutro Abs: 6 10*3/uL (ref 1.7–7.7)
Neutrophils Relative %: 74 %
Platelet Count: 221 10*3/uL (ref 150–400)
RBC: 5.12 MIL/uL (ref 4.22–5.81)
RDW: 13.5 % (ref 11.5–15.5)
WBC Count: 8.1 10*3/uL (ref 4.0–10.5)
nRBC: 0 % (ref 0.0–0.2)

## 2020-07-12 LAB — CMP (CANCER CENTER ONLY)
ALT: 14 U/L (ref 0–44)
AST: 14 U/L — ABNORMAL LOW (ref 15–41)
Albumin: 4.2 g/dL (ref 3.5–5.0)
Alkaline Phosphatase: 96 U/L (ref 38–126)
Anion gap: 7 (ref 5–15)
BUN: 18 mg/dL (ref 6–20)
CO2: 26 mmol/L (ref 22–32)
Calcium: 9.6 mg/dL (ref 8.9–10.3)
Chloride: 102 mmol/L (ref 98–111)
Creatinine: 1.01 mg/dL (ref 0.61–1.24)
GFR, Estimated: >60 mL/min (ref >60)
Glucose, Bld: 98 mg/dL (ref 70–99)
Potassium: 4.2 mmol/L (ref 3.5–5.1)
Sodium: 135 mmol/L (ref 135–145)
Total Bilirubin: 0.3 mg/dL (ref 0.3–1.2)
Total Protein: 6.4 g/dL — ABNORMAL LOW (ref 6.5–8.1)

## 2020-07-12 LAB — LACTATE DEHYDROGENASE: LDH: 152 U/L (ref 98–192)

## 2020-07-12 MED ORDER — SODIUM CHLORIDE 0.9% FLUSH
10.0000 mL | INTRAVENOUS | Status: DC | PRN
  Administered 2020-07-12: 10 mL
  Filled 2020-07-12: qty 10

## 2020-07-12 MED ORDER — HEPARIN SOD (PORK) LOCK FLUSH 100 UNIT/ML IV SOLN
500.0000 [IU] | Freq: Once | INTRAVENOUS | Status: AC | PRN
  Administered 2020-07-12: 500 [IU]
  Filled 2020-07-12: qty 5

## 2020-07-12 MED ORDER — FAMCICLOVIR 500 MG PO TABS
500.0000 mg | ORAL_TABLET | Freq: Every day | ORAL | 6 refills | Status: DC
  Filled 2020-07-12: qty 30, 30d supply, fill #0

## 2020-07-12 MED ORDER — SODIUM CHLORIDE 0.9 % IV SOLN
Freq: Once | INTRAVENOUS | Status: AC
  Filled 2020-07-12: qty 250

## 2020-07-12 MED ORDER — SODIUM CHLORIDE 0.9 % IV SOLN
480.0000 mg | Freq: Once | INTRAVENOUS | Status: AC
  Administered 2020-07-12: 480 mg via INTRAVENOUS
  Filled 2020-07-12: qty 48

## 2020-07-12 NOTE — Patient Instructions (Signed)
Implanted Port Insertion, Care After This sheet gives you information about how to care for yourself after your procedure. Your health care provider may also give you more specific instructions. If you have problems or questions, contact your health care provider. What can I expect after the procedure? After the procedure, it is common to have:  Discomfort at the port insertion site.  Bruising on the skin over the port. This should improve over 3-4 days. Follow these instructions at home: Port care  After your port is placed, you will get a manufacturer's information card. The card has information about your port. Keep this card with you at all times.  Take care of the port as told by your health care provider. Ask your health care provider if you or a family member can get training for taking care of the port at home. A home health care nurse may also take care of the port.  Make sure to remember what type of port you have. Incision care  Follow instructions from your health care provider about how to take care of your port insertion site. Make sure you: ? Wash your hands with soap and water before and after you change your bandage (dressing). If soap and water are not available, use hand sanitizer. ? Change your dressing as told by your health care provider. ? Leave stitches (sutures), skin glue, or adhesive strips in place. These skin closures may need to stay in place for 2 weeks or longer. If adhesive strip edges start to loosen and curl up, you may trim the loose edges. Do not remove adhesive strips completely unless your health care provider tells you to do that.  Check your port insertion site every day for signs of infection. Check for: ? Redness, swelling, or pain. ? Fluid or blood. ? Warmth. ? Pus or a bad smell.      Activity  Return to your normal activities as told by your health care provider. Ask your health care provider what activities are safe for you.  Do not  lift anything that is heavier than 10 lb (4.5 kg), or the limit that you are told, until your health care provider says that it is safe. General instructions  Take over-the-counter and prescription medicines only as told by your health care provider.  Do not take baths, swim, or use a hot tub until your health care provider approves. Ask your health care provider if you may take showers. You may only be allowed to take sponge baths.  Do not drive for 24 hours if you were given a sedative during your procedure.  Wear a medical alert bracelet in case of an emergency. This will tell any health care providers that you have a port.  Keep all follow-up visits as told by your health care provider. This is important. Contact a health care provider if:  You cannot flush your port with saline as directed, or you cannot draw blood from the port.  You have a fever or chills.  You have redness, swelling, or pain around your port insertion site.  You have fluid or blood coming from your port insertion site.  Your port insertion site feels warm to the touch.  You have pus or a bad smell coming from the port insertion site. Get help right away if:  You have chest pain or shortness of breath.  You have bleeding from your port that you cannot control. Summary  Take care of the port as told by your   health care provider. Keep the manufacturer's information card with you at all times.  Change your dressing as told by your health care provider.  Contact a health care provider if you have a fever or chills or if you have redness, swelling, or pain around your port insertion site.  Keep all follow-up visits as told by your health care provider. This information is not intended to replace advice given to you by your health care provider. Make sure you discuss any questions you have with your health care provider. Document Revised: 08/20/2017 Document Reviewed: 08/20/2017 Elsevier Patient Education   2021 Elsevier Inc.  

## 2020-07-12 NOTE — Patient Instructions (Signed)
[facility]  Discharge Instructions: Thank you for choosing [facility] to provide your oncology and hematology care.   If you have a lab appointment with the Cancer Center, please go directly to the Cancer Center and check in at the registration area.  Wear comfortable clothing and clothing appropriate for easy access to any Portacath or PICC line.   We strive to give you quality time with your provider. You may need to reschedule your appointment if you arrive late (15 or more minutes).  Arriving late affects you and other patients whose appointments are after yours.  Also, if you miss three or more appointments without notifying the office, you may be dismissed from the clinic at the provider's discretion.      For prescription refill requests, have your pharmacy contact our office and allow 72 hours for refills to be completed.    Today you received the following chemotherapy and/or immunotherapy agents opdivo    To help prevent nausea and vomiting after your treatment, we encourage you to take your nausea medication as directed.  BELOW ARE SYMPTOMS THAT SHOULD BE REPORTED IMMEDIATELY: *FEVER GREATER THAN 100.4 F (38 C) OR HIGHER *CHILLS OR SWEATING *NAUSEA AND VOMITING THAT IS NOT CONTROLLED WITH YOUR NAUSEA MEDICATION *UNUSUAL SHORTNESS OF BREATH *UNUSUAL BRUISING OR BLEEDING *URINARY PROBLEMS (pain or burning when urinating, or frequent urination) *BOWEL PROBLEMS (unusual diarrhea, constipation, pain near the anus) TENDERNESS IN MOUTH AND THROAT WITH OR WITHOUT PRESENCE OF ULCERS (sore throat, sores in mouth, or a toothache) UNUSUAL RASH, SWELLING OR PAIN  UNUSUAL VAGINAL DISCHARGE OR ITCHING   Items with * indicate a potential emergency and should be followed up as soon as possible or go to the Emergency Department if any problems should occur.  Please show the CHEMOTHERAPY ALERT CARD or IMMUNOTHERAPY ALERT CARD at check-in to the  Emergency Department and triage nurse. Should you have questions after your visit or need to cancel or reschedule your appointment, please contact [facility]  [phone] and follow the prompts.  Office hours are 8:00 a.m. to 4:30 p.m. Monday - Friday. Please note that voicemails left after 4:00 p.m. may not be returned until the following business day.  We are closed weekends and major holidays. You have access to a nurse at all times for urgent questions. Please call the main number to the clinic [phone] and follow the prompts.  For any non-urgent questions, you may also contact your provider using MyChart. We now offer e-Visits for anyone 18 and older to request care online for non-urgent symptoms. For details visit [email or web address].   Also download the MyChart app! Go to the app store, search "MyChart", open the app, select [facility], and log in with your MyChart username and password.  Due to Covid, a mask is required upon entering the hospital/clinic. If you do not have a mask, one will be given to you upon arrival. For doctor visits, patients may have 1 support person aged 18 or older with them. For treatment visits, patients cannot have anyone with them due to current Covid guidelines and our immunocompromised population.  

## 2020-07-12 NOTE — Progress Notes (Signed)
Prior auth okay for Opdivo today per Otilio Carpen, [employer].

## 2020-07-12 NOTE — Progress Notes (Addendum)
Hematology and Oncology Follow Up Visit  Vincent David [id number] Mar 31, 1965 55 y.o. 07/12/2020   Principle Diagnosis:   Metastatic Renal Cell Carcinoma - bone mets  Current Therapy:    Nivoumab/Ipilimumab -- s/p cycle #3 -- started David 09/29/2019 -- maintenance Nivolumab q month -- Started David 12/29/2019  Xgeva 120 mg sq q 3 months -- next dose in 08/2020  XRT to right iliac mass/ L5 vertebral body/ Left femoral neck  LEFT hip repair     Interim History:  Vincent David is back for follow-up.  He is doing pretty well.  He is quite tanned.  This is from [place].  He has been quite busy at the golf course.  We did go ahead and do a PET scan David him.  This was done David 07/07/2020.  This showed essentially stable disease.  There is no new bone metastasis.  He had no increase in activity in bony lesions.  He had very low SUV for his bone lesion.  What I think is very interesting to try is to see if we cannot treat the actual primary in the LEFT kidney.  The tumor is 3.4 x 3.5 cm.  I think would be worthwhile trying to do radioablation or cryotherapy.  Again I know he has metastatic disease but yet he has oligometastatic disease and I think would be worthwhile trying to go after the primary to see if we can help decrease the risk of metastatic disease down the road.  I will get an MRI of his left kidney so we can get a better assessment of the size of his tumor.  His pain is doing fairly well.  He is David OxyContin and oxycodone.  He and his wife are planning David going up to [place] state over the July 4 weekend.  They will be up there for about a week.  I am sure that he will have a good time up there.  He has had a good appetite.  He has had no change in bowel or bladder habits.  There has been no rashes although he did have a rash while was localized under the right pectoral area.  This certainly looks like localized shingles.  Because of this, I will put David some antiviral  medication.  Overall, I would have to say that his performance status is probably ECOG 1.     Medications:  Current Outpatient Medications:  .  aspirin EC 81 MG tablet, Take 1 tablet (81 mg total) by mouth 2 (two) times daily., Disp: 60 tablet, Rfl: 0 .  lamoTRIgine (LAMICTAL) 100 MG tablet, TAKE 1 TABLET BY MOUTH TWICE A DAY, Disp: 180 tablet, Rfl: 1 .  nicotine (NICODERM CQ - DOSED IN MG/24 HR) 7 mg/24hr patch, Place 1 patch (7 mg total) onto the skin daily., Disp: 28 patch, Rfl: 0 .  oxyCODONE (OXYCONTIN) 15 mg 12 hr tablet, Take 1 tablet (15 mg total) by mouth every 12 (twelve) hours., Disp: 60 tablet, Rfl: 0 .  Oxycodone HCl 10 MG TABS, Take 1 tablet (10 mg total) by mouth every 4 (four) hours as needed., Disp: 90 tablet, Rfl: 0 .  temazepam (RESTORIL) 15 MG capsule, Take 1 capsule (15 mg total) by mouth at bedtime as needed for sleep., Disp: 30 capsule, Rfl: 0 No current facility-administered medications for this visit.  Facility-Administered Medications Ordered in Other Visits:  .  sodium chloride flush (NS) 0.9 % injection 10 mL, 10 mL, Intracatheter, PRN, Beverlie Kurihara,  Rudell Cobb, MD, 10 mL at 05/19/20 1343  Allergies: No Known Allergies  Past Medical History, Surgical history, Social history, and Family History were reviewed and updated.  Review of Systems: Review of Systems  Constitutional: Negative.   HENT:  Negative.   Eyes: Negative.   Respiratory: Negative.   Cardiovascular: Negative.   Gastrointestinal: Negative.   Endocrine: Negative.   Musculoskeletal: Positive for back pain and flank pain.  Skin: Negative.   Neurological: Negative.   Hematological: Negative.   Psychiatric/Behavioral: Negative.     Physical Exam:  height is 5\' 7"  (1.702 m) and weight is 95.3 kg. His oral temperature is 98 F (36.7 C). His blood pressure is 174/86 (abnormal) and his pulse is 77. His respiration is 17 and oxygen saturation is 99%.   Wt Readings from Last 3 Encounters:  07/12/20 95.3  kg  07/06/20 96.6 kg  06/15/20 95.3 kg    Physical Exam Vitals reviewed.  HENT:     Head: Normocephalic and atraumatic.  Eyes:     Pupils: Pupils are equal, round, and reactive to light.  Cardiovascular:     Rate and Rhythm: Normal rate and regular rhythm.     Heart sounds: Normal heart sounds.  Pulmonary:     Effort: Pulmonary effort is normal.     Breath sounds: Normal breath sounds.  Abdominal:     General: Bowel sounds are normal.     Palpations: Abdomen is soft.  Musculoskeletal:        General: No tenderness or deformity. Normal range of motion.     Cervical back: Normal range of motion.  Lymphadenopathy:     Cervical: No cervical adenopathy.  Skin:    General: Skin is warm and dry.     Findings: No erythema or rash.  Neurological:     Mental Status: He is alert and oriented to person, place, and time.  Psychiatric:        Behavior: Behavior normal.        Thought Content: Thought content normal.        Judgment: Judgment normal.      Lab Results  Component Value Date   WBC 8.1 07/12/2020   HGB 16.1 07/12/2020   HCT 47.1 07/12/2020   MCV 92.0 07/12/2020   PLT 221 07/12/2020     Chemistry      Component Value Date/Time   NA 135 07/12/2020 1049   K 4.2 07/12/2020 1049   CL 102 07/12/2020 1049   CO2 26 07/12/2020 1049   BUN 18 07/12/2020 1049   CREATININE 1.01 07/12/2020 1049   CREATININE 1.15 08/25/2019 1450      Component Value Date/Time   CALCIUM 9.6 07/12/2020 1049   ALKPHOS 96 07/12/2020 1049   AST 14 (L) 07/12/2020 1049   ALT 14 07/12/2020 1049   BILITOT 0.3 07/12/2020 1049      Impression and Plan: Vincent David is a very nice 55 year old white male.  He has metastatic renal cell carcinoma.  For some reason, his disease clearly favors his bones.  His lungs and liver and lymph nodes all appear to be clean.  Again, I will see if we cannot actually treat the primary in the left kidney.  I think this would be very worthwhile given that he is  young, in very good shape, and really has oligometastatic disease.  I will plan to get him back probably in about 6 weeks.  I know this is little bit longer than normal follow-up but  he will be going David vacation.  We will see about the MRI in a couple weeks.  Once have the MRI result back, I will make referral to Interventional Radiology to see what they might be able to do with radio ablation.  6/7/20221:03 PM

## 2020-07-12 NOTE — Addendum Note (Signed)
Addended by: Burney Gauze R on: 07/12/2020 01:20 PM   Modules accepted: Orders

## 2020-07-12 NOTE — Telephone Encounter (Signed)
Per pts wife please change appt from 7/7 to 7/14,    Vincent David

## 2020-07-13 ENCOUNTER — Other Ambulatory Visit (HOSPITAL_BASED_OUTPATIENT_CLINIC_OR_DEPARTMENT_OTHER): Payer: Self-pay

## 2020-07-13 ENCOUNTER — Other Ambulatory Visit: Payer: Self-pay | Admitting: Family

## 2020-07-13 DIAGNOSIS — C642 Malignant neoplasm of left kidney, except renal pelvis: Secondary | ICD-10-CM

## 2020-07-13 LAB — T4: T4, Total: 5.4 ug/dL (ref 4.5–12.0)

## 2020-07-13 LAB — TSH: TSH: 2.35 u[IU]/mL (ref 0.320–4.118)

## 2020-07-15 ENCOUNTER — Telehealth: Payer: Self-pay

## 2020-07-22 ENCOUNTER — Other Ambulatory Visit (HOSPITAL_BASED_OUTPATIENT_CLINIC_OR_DEPARTMENT_OTHER): Payer: Self-pay

## 2020-07-26 ENCOUNTER — Other Ambulatory Visit: Payer: Self-pay

## 2020-07-26 ENCOUNTER — Ambulatory Visit (HOSPITAL_COMMUNITY)
Admission: RE | Admit: 2020-07-26 | Discharge: 2020-07-26 | Disposition: A | Discharge status: Home / Self Care | Payer: BC Managed Care – PPO | Source: Ambulatory Visit | Attending: Hematology & Oncology | Admitting: Hematology & Oncology

## 2020-07-26 DIAGNOSIS — C642 Malignant neoplasm of left kidney, except renal pelvis: Secondary | ICD-10-CM | POA: Insufficient documentation

## 2020-07-26 DIAGNOSIS — N2889 Other specified disorders of kidney and ureter: Secondary | ICD-10-CM | POA: Diagnosis not present

## 2020-07-26 DIAGNOSIS — N281 Cyst of kidney, acquired: Secondary | ICD-10-CM | POA: Diagnosis not present

## 2020-07-26 DIAGNOSIS — C7951 Secondary malignant neoplasm of bone: Secondary | ICD-10-CM | POA: Diagnosis not present

## 2020-07-26 MED ORDER — GADOBUTROL 1 MMOL/ML IV SOLN
9.0000 mL | Freq: Once | INTRAVENOUS | Status: AC | PRN
  Administered 2020-07-26: 9 mL via INTRAVENOUS

## 2020-08-02 ENCOUNTER — Encounter: Payer: Self-pay | Admitting: *Deleted

## 2020-08-11 ENCOUNTER — Ambulatory Visit: Payer: Self-pay | Admitting: Hematology & Oncology

## 2020-08-11 ENCOUNTER — Other Ambulatory Visit: Payer: Self-pay

## 2020-08-11 ENCOUNTER — Ambulatory Visit: Payer: Self-pay

## 2020-08-17 ENCOUNTER — Encounter: Payer: Self-pay | Admitting: Hematology & Oncology

## 2020-08-17 ENCOUNTER — Inpatient Hospital Stay: Payer: BC Managed Care – PPO

## 2020-08-17 ENCOUNTER — Telehealth: Payer: Self-pay

## 2020-08-17 ENCOUNTER — Other Ambulatory Visit: Payer: Self-pay | Admitting: Hematology & Oncology

## 2020-08-17 ENCOUNTER — Other Ambulatory Visit: Payer: Self-pay

## 2020-08-17 ENCOUNTER — Inpatient Hospital Stay: Payer: BC Managed Care – PPO | Attending: Hematology & Oncology

## 2020-08-17 ENCOUNTER — Inpatient Hospital Stay (HOSPITAL_BASED_OUTPATIENT_CLINIC_OR_DEPARTMENT_OTHER): Payer: BC Managed Care – PPO | Admitting: Hematology & Oncology

## 2020-08-17 VITALS — BP 169/63 | HR 85 | Temp 98.1°F | Resp 18 | Wt 203.0 lb

## 2020-08-17 DIAGNOSIS — Z79899 Other long term (current) drug therapy: Secondary | ICD-10-CM | POA: Insufficient documentation

## 2020-08-17 DIAGNOSIS — Z5112 Encounter for antineoplastic immunotherapy: Secondary | ICD-10-CM | POA: Diagnosis not present

## 2020-08-17 DIAGNOSIS — R5383 Other fatigue: Secondary | ICD-10-CM | POA: Diagnosis not present

## 2020-08-17 DIAGNOSIS — C7951 Secondary malignant neoplasm of bone: Secondary | ICD-10-CM | POA: Insufficient documentation

## 2020-08-17 DIAGNOSIS — C642 Malignant neoplasm of left kidney, except renal pelvis: Secondary | ICD-10-CM

## 2020-08-17 DIAGNOSIS — C649 Malignant neoplasm of unspecified kidney, except renal pelvis: Secondary | ICD-10-CM

## 2020-08-17 DIAGNOSIS — Z7982 Long term (current) use of aspirin: Secondary | ICD-10-CM | POA: Diagnosis not present

## 2020-08-17 DIAGNOSIS — Z9189 Other specified personal risk factors, not elsewhere classified: Secondary | ICD-10-CM

## 2020-08-17 LAB — CMP (CANCER CENTER ONLY)
ALT: 15 U/L (ref 0–44)
AST: 17 U/L (ref 15–41)
Albumin: 4.2 g/dL (ref 3.5–5.0)
Alkaline Phosphatase: 102 U/L (ref 38–126)
Anion gap: 8 (ref 5–15)
BUN: 21 mg/dL — ABNORMAL HIGH (ref 6–20)
CO2: 27 mmol/L (ref 22–32)
Calcium: 8.7 mg/dL — ABNORMAL LOW (ref 8.9–10.3)
Chloride: 103 mmol/L (ref 98–111)
Creatinine: 1.17 mg/dL (ref 0.61–1.24)
GFR, Estimated: >60 mL/min (ref >60)
Glucose, Bld: 171 mg/dL — ABNORMAL HIGH (ref 70–99)
Potassium: 4 mmol/L (ref 3.5–5.1)
Sodium: 138 mmol/L (ref 135–145)
Total Bilirubin: 0.5 mg/dL (ref 0.3–1.2)
Total Protein: 6.2 g/dL — ABNORMAL LOW (ref 6.5–8.1)

## 2020-08-17 LAB — CBC WITH DIFFERENTIAL (CANCER CENTER ONLY)
Abs Immature Granulocytes: 0.06 10*3/uL (ref 0.00–0.07)
Basophils Absolute: 0 10*3/uL (ref 0.0–0.1)
Basophils Relative: 1 %
Eosinophils Absolute: 0.3 10*3/uL (ref 0.0–0.5)
Eosinophils Relative: 4 %
HCT: 45.3 % (ref 39.0–52.0)
Hemoglobin: 15.4 g/dL (ref 13.0–17.0)
Immature Granulocytes: 1 %
Lymphocytes Relative: 14 %
Lymphs Abs: 1 10*3/uL (ref 0.7–4.0)
MCH: 31.8 pg (ref 26.0–34.0)
MCHC: 34 g/dL (ref 30.0–36.0)
MCV: 93.6 fL (ref 80.0–100.0)
Monocytes Absolute: 0.4 10*3/uL (ref 0.1–1.0)
Monocytes Relative: 6 %
Neutro Abs: 5.4 10*3/uL (ref 1.7–7.7)
Neutrophils Relative %: 74 %
Platelet Count: 230 10*3/uL (ref 150–400)
RBC: 4.84 MIL/uL (ref 4.22–5.81)
RDW: 13.5 % (ref 11.5–15.5)
WBC Count: 7.2 10*3/uL (ref 4.0–10.5)
nRBC: 0 % (ref 0.0–0.2)

## 2020-08-17 LAB — TSH: TSH: 2.096 u[IU]/mL (ref 0.320–4.118)

## 2020-08-17 LAB — LACTATE DEHYDROGENASE: LDH: 195 U/L — ABNORMAL HIGH (ref 98–192)

## 2020-08-17 MED ORDER — HEPARIN SOD (PORK) LOCK FLUSH 100 UNIT/ML IV SOLN
500.0000 [IU] | Freq: Once | INTRAVENOUS | Status: AC | PRN
  Administered 2020-08-17: 500 [IU]
  Filled 2020-08-17: qty 5

## 2020-08-17 MED ORDER — SODIUM CHLORIDE 0.9% FLUSH
10.0000 mL | INTRAVENOUS | Status: DC | PRN
  Administered 2020-08-17: 10 mL
  Filled 2020-08-17: qty 10

## 2020-08-17 MED ORDER — SODIUM CHLORIDE 0.9 % IV SOLN
Freq: Once | INTRAVENOUS | Status: AC
  Filled 2020-08-17: qty 250

## 2020-08-17 MED ORDER — DENOSUMAB 120 MG/1.7ML ~~LOC~~ SOLN
120.0000 mg | Freq: Once | SUBCUTANEOUS | Status: AC
  Administered 2020-08-17: 120 mg via SUBCUTANEOUS

## 2020-08-17 MED ORDER — DENOSUMAB 120 MG/1.7ML ~~LOC~~ SOLN
SUBCUTANEOUS | Status: AC
  Filled 2020-08-17: qty 1.7

## 2020-08-17 MED ORDER — SODIUM CHLORIDE 0.9 % IV SOLN
480.0000 mg | Freq: Once | INTRAVENOUS | Status: AC
  Administered 2020-08-17: 480 mg via INTRAVENOUS
  Filled 2020-08-17: qty 48

## 2020-08-17 NOTE — Telephone Encounter (Signed)
Appts made per 08/16/20 los, pt to gain sch at [employer], avs/tx and through [employer]

## 2020-08-17 NOTE — Progress Notes (Signed)
Hematology and Oncology Follow Up Visit  Abubakar Crispo [id number] [birth year] 55 y.o. 08/17/2020   Principle Diagnosis:  Metastatic Renal Cell Carcinoma - bone mets  Current Therapy:   Nivoumab/Ipilimumab -- s/p cycle #3 -- started on 09/29/2019 -- maintenance Nivolumab q month -- Started on 12/29/2019 Xgeva 120 mg sq q 3 months -- next dose in 11/2020 XRT to right iliac mass/ L5 vertebral body/ Left femoral neck LEFT hip repair     Interim History:  Mr. Hazen is back for follow-up.  He looks quite good.  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  He enjoys doing this.  He just keeps him busy.  He and his wife did go to some family events.  They enjoy themselves.  Did go ahead and do an MRI.  This was done on 07/26/2020.  He has stable disease with respect to the left kidney mass.  This measures 4 x 3.3 cm.  There is very little active enhancement within the tumor.  To me, this indicates that immunotherapy is working.  He does have the bony lesions which he is always had.  He will always have these.  Nothing looks to be worse.  Pain control is doing quite well.  He has had no change in bowel or bladder habits.  He has had no bleeding.  He has had no leg swelling.  There is no cough or shortness of breath.  He does get fatigued on occasion.  He does have some mental fatigue.  I am sure this is from the immunotherapy.  He does not have a lot of focus or mental stamina.  Currently, his performance status is ECOG 1.    Medications:  Current Outpatient Medications:    aspirin EC 81 MG tablet, Take 1 tablet (81 mg total) by mouth 2 (two) times daily., Disp: 60 tablet, Rfl: 0   famciclovir (FAMVIR) 500 MG tablet, Take 1 tablet (500 mg total) by mouth daily., Disp: 30 tablet, Rfl: 6   lamoTRIgine (LAMICTAL) 100 MG tablet, TAKE 1 TABLET BY MOUTH TWICE A DAY, Disp: 180 tablet, Rfl: 1   nicotine (NICODERM CQ - DOSED IN MG/24 HR) 7 mg/24hr patch, Place 1 patch (7 mg total) onto the skin daily.,  Disp: 28 patch, Rfl: 0   oxyCODONE (OXYCONTIN) 15 mg 12 hr tablet, Take 1 tablet (15 mg total) by mouth every 12 (twelve) hours., Disp: 60 tablet, Rfl: 0   Oxycodone HCl 10 MG TABS, Take 1 tablet (10 mg total) by mouth every 4 (four) hours as needed., Disp: 90 tablet, Rfl: 0   temazepam (RESTORIL) 15 MG capsule, Take 1 capsule (15 mg total) by mouth at bedtime as needed for sleep., Disp: 30 capsule, Rfl: 0 No current facility-administered medications for this visit.  Facility-Administered Medications Ordered in Other Visits:    sodium chloride flush (NS) 0.9 % injection 10 mL, 10 mL, Intracatheter, PRN, Volanda Napoleon, MD, 10 mL at 05/19/20 1343  Allergies: No Known Allergies  Past Medical History, Surgical history, Social history, and Family History were reviewed and updated.  Review of Systems: Review of Systems  Constitutional: Negative.   HENT:  Negative.    Eyes: Negative.   Respiratory: Negative.    Cardiovascular: Negative.   Gastrointestinal: Negative.   Endocrine: Negative.   Musculoskeletal:  Positive for back pain and flank pain.  Skin: Negative.   Neurological: Negative.   Hematological: Negative.   Psychiatric/Behavioral: Negative.     Physical Exam:  weight is 203 lb (  92.1 kg). His oral temperature is 98.1 F (36.7 C). His blood pressure is 169/63 (abnormal) and his pulse is 85. His respiration is 18 and oxygen saturation is 99%.   Wt Readings from Last 3 Encounters:  08/17/20 203 lb (92.1 kg)  07/12/20 210 lb (95.3 kg)  07/06/20 213 lb (96.6 kg)    Physical Exam Vitals reviewed.  HENT:     Head: Normocephalic and atraumatic.  Eyes:     Pupils: Pupils are equal, round, and reactive to light.  Cardiovascular:     Rate and Rhythm: Normal rate and regular rhythm.     Heart sounds: Normal heart sounds.  Pulmonary:     Effort: Pulmonary effort is normal.     Breath sounds: Normal breath sounds.  Abdominal:     General: Bowel sounds are normal.      Palpations: Abdomen is soft.  Musculoskeletal:        General: No tenderness or deformity. Normal range of motion.     Cervical back: Normal range of motion.  Lymphadenopathy:     Cervical: No cervical adenopathy.  Skin:    General: Skin is warm and dry.     Findings: No erythema or rash.  Neurological:     Mental Status: He is alert and oriented to person, place, and time.  Psychiatric:        Behavior: Behavior normal.        Thought Content: Thought content normal.        Judgment: Judgment normal.     Lab Results  Component Value Date   WBC 7.2 08/17/2020   HGB 15.4 08/17/2020   HCT 45.3 08/17/2020   MCV 93.6 08/17/2020   PLT 230 08/17/2020     Chemistry      Component Value Date/Time   NA 135 07/12/2020 1049   K 4.2 07/12/2020 1049   CL 102 07/12/2020 1049   CO2 26 07/12/2020 1049   BUN 18 07/12/2020 1049   CREATININE 1.01 07/12/2020 1049   CREATININE 1.15 08/25/2019 1450      Component Value Date/Time   CALCIUM 9.6 07/12/2020 1049   ALKPHOS 96 07/12/2020 1049   AST 14 (L) 07/12/2020 1049   ALT 14 07/12/2020 1049   BILITOT 0.3 07/12/2020 1049      Impression and Plan: Mr. Frick is a very nice 55 year old white male.  He has metastatic renal cell carcinoma.  For some reason, his disease clearly favors his bones.  His lungs and liver and lymph nodes all appear to be clean.  Again, I thought about the possibility of using local therapy for the left kidney cancer.  It certainly is not all that big.  Maybe, we could consider radioablation for this.  I will have to see if interventional radiology would consider doing this.  He really has only oligometastatic disease so I think if we treat the primary, this can help with his metastatic disease.  We will continue him on the immunotherapy.  We will plan to have him come back to see [place] in another 6 weeks.  He enjoys the 6-week intervals.   7/13/20228:39 AM

## 2020-08-17 NOTE — Patient Instructions (Signed)
[facility]  Discharge Instructions: Thank you for choosing [facility] to provide your oncology and hematology care.   If you have a lab appointment with the Cancer Center, please go directly to the Cancer Center and check in at the registration area.  Wear comfortable clothing and clothing appropriate for easy access to any Portacath or PICC line.   We strive to give you quality time with your provider. You may need to reschedule your appointment if you arrive late (15 or more minutes).  Arriving late affects you and other patients whose appointments are after yours.  Also, if you miss three or more appointments without notifying the office, you may be dismissed from the clinic at the provider's discretion.      For prescription refill requests, have your pharmacy contact our office and allow 72 hours for refills to be completed.    Today you received the following chemotherapy and/or immunotherapy agents:  Opdivo      To help prevent nausea and vomiting after your treatment, we encourage you to take your nausea medication as directed.  BELOW ARE SYMPTOMS THAT SHOULD BE REPORTED IMMEDIATELY: *FEVER GREATER THAN 100.4 F (38 C) OR HIGHER *CHILLS OR SWEATING *NAUSEA AND VOMITING THAT IS NOT CONTROLLED WITH YOUR NAUSEA MEDICATION *UNUSUAL SHORTNESS OF BREATH *UNUSUAL BRUISING OR BLEEDING *URINARY PROBLEMS (pain or burning when urinating, or frequent urination) *BOWEL PROBLEMS (unusual diarrhea, constipation, pain near the anus) TENDERNESS IN MOUTH AND THROAT WITH OR WITHOUT PRESENCE OF ULCERS (sore throat, sores in mouth, or a toothache) UNUSUAL RASH, SWELLING OR PAIN  UNUSUAL VAGINAL DISCHARGE OR ITCHING   Items with * indicate a potential emergency and should be followed up as soon as possible or go to the Emergency Department if any problems should occur.  Please show the CHEMOTHERAPY ALERT CARD or IMMUNOTHERAPY ALERT CARD at check-in to the  Emergency Department and triage nurse. Should you have questions after your visit or need to cancel or reschedule your appointment, please contact [facility]  [phone] and follow the prompts.  Office hours are 8:00 a.m. to 4:30 p.m. Monday - Friday. Please note that voicemails left after 4:00 p.m. may not be returned until the following business day.  We are closed weekends and major holidays. You have access to a nurse at all times for urgent questions. Please call the main number to the clinic [phone] and follow the prompts.  For any non-urgent questions, you may also contact your provider using MyChart. We now offer e-Visits for anyone 18 and older to request care online for non-urgent symptoms. For details visit [email or web address].   Also download the MyChart app! Go to the app store, search "MyChart", open the app, select [facility], and log in with your MyChart username and password.  Due to Covid, a mask is required upon entering the hospital/clinic. If you do not have a mask, one will be given to you upon arrival. For doctor visits, patients may have 1 support person aged 18 or older with them. For treatment visits, patients cannot have anyone with them due to current Covid guidelines and our immunocompromised population.  

## 2020-08-17 NOTE — Patient Instructions (Signed)
Implanted Port Home Guide An implanted port is a device that is placed under the skin. It is usually placed in the chest. The device can be used to give IV medicine, to take blood, or for dialysis. You may have an implanted port if: You need IV medicine that would be irritating to the small veins in your hands or arms. You need IV medicines, such as antibiotics, for a long period of time. You need IV nutrition for a long period of time. You need dialysis. When you have a port, your health care provider can choose to use the port instead of veins in your arms for these procedures. You may have fewer limitations when using a port than you would if you used other types of long-term IVs, and you will likely be able to return to normal activities afteryour incision heals. An implanted port has two main parts: Reservoir. The reservoir is the part where a needle is inserted to give medicines or draw blood. The reservoir is round. After it is placed, it appears as a small, raised area under your skin. Catheter. The catheter is a thin, flexible tube that connects the reservoir to a vein. Medicine that is inserted into the reservoir goes into the catheter and then into the vein. How is my port accessed? To access your port: A numbing cream may be placed on the skin over the port site. Your health care provider will put on a mask and sterile gloves. The skin over your port will be cleaned carefully with a germ-killing soap and allowed to dry. Your health care provider will gently pinch the port and insert a needle into it. Your health care provider will check for a blood return to make sure the port is in the vein and is not clogged. If your port needs to remain accessed to get medicine continuously (constant infusion), your health care provider will place a clear bandage (dressing) over the needle site. The dressing and needle will need to be changed every week, or as told by your health care provider. What  is flushing? Flushing helps keep the port from getting clogged. Follow instructions from your health care provider about how and when to flush the port. Ports are usually flushed with saline solution or a medicine called heparin. The need for flushing will depend on how the port is used: If the port is only used from time to time to give medicines or draw blood, the port may need to be flushed: Before and after medicines have been given. Before and after blood has been drawn. As part of routine maintenance. Flushing may be recommended every 4-6 weeks. If a constant infusion is running, the port may not need to be flushed. Throw away any syringes in a disposal container that is meant for sharp items (sharps container). You can buy a sharps container from a pharmacy, or you can make one by using an empty hard plastic bottle with a cover. How long will my port stay implanted? The port can stay in for as long as your health care provider thinks it is needed. When it is time for the port to come out, a surgery will be done to remove it. The surgery will be similar to the procedure that was done to putthe port in. Follow these instructions at home:  Flush your port as told by your health care provider. If you need an infusion over several days, follow instructions from your health care provider about how to take   care of your port site. Make sure you: Wash your hands with soap and water before you change your dressing. If soap and water are not available, use alcohol-based hand sanitizer. Change your dressing as told by your health care provider. Place any used dressings or infusion bags into a plastic bag. Throw that bag in the trash. Keep the dressing that covers the needle clean and dry. Do not get it wet. Do not use scissors or sharp objects near the tube. Keep the tube clamped, unless it is being used. Check your port site every day for signs of infection. Check for: Redness, swelling, or  pain. Fluid or blood. Pus or a bad smell. Protect the skin around the port site. Avoid wearing bra straps that rub or irritate the site. Protect the skin around your port from seat belts. Place a soft pad over your chest if needed. Bathe or shower as told by your health care provider. The site may get wet as long as you are not actively receiving an infusion. Return to your normal activities as told by your health care provider. Ask your health care provider what activities are safe for you. Carry a medical alert card or wear a medical alert bracelet at all times. This will let health care providers know that you have an implanted port in case of an emergency. Get help right away if: You have redness, swelling, or pain at the port site. You have fluid or blood coming from your port site. You have pus or a bad smell coming from the port site. You have a fever. Summary Implanted ports are usually placed in the chest for long-term IV access. Follow instructions from your health care provider about flushing the port and changing bandages (dressings). Take care of the area around your port by avoiding clothing that puts pressure on the area, and by watching for signs of infection. Protect the skin around your port from seat belts. Place a soft pad over your chest if needed. Get help right away if you have a fever or you have redness, swelling, pain, drainage, or a bad smell at the port site. This information is not intended to replace advice given to you by your health care provider. Make sure you discuss any questions you have with your healthcare provider. Document Revised: 06/08/2019 Document Reviewed: 06/08/2019 Elsevier Patient Education  2022 Elsevier Inc.  

## 2020-08-18 ENCOUNTER — Ambulatory Visit: Payer: Self-pay

## 2020-08-18 ENCOUNTER — Other Ambulatory Visit: Payer: Self-pay

## 2020-08-18 ENCOUNTER — Ambulatory Visit: Payer: Self-pay | Admitting: Hematology & Oncology

## 2020-08-18 LAB — T4: T4, Total: 6.1 ug/dL (ref 4.5–12.0)

## 2020-08-30 ENCOUNTER — Encounter: Payer: Self-pay | Admitting: Hematology & Oncology

## 2020-08-30 ENCOUNTER — Ambulatory Visit
Admission: RE | Admit: 2020-08-30 | Discharge: 2020-08-30 | Disposition: A | Discharge status: Home / Self Care | Payer: BC Managed Care – PPO | Source: Ambulatory Visit | Attending: Hematology & Oncology | Admitting: Hematology & Oncology

## 2020-08-30 ENCOUNTER — Encounter: Payer: Self-pay | Admitting: *Deleted

## 2020-08-30 DIAGNOSIS — C642 Malignant neoplasm of left kidney, except renal pelvis: Secondary | ICD-10-CM | POA: Diagnosis not present

## 2020-08-30 DIAGNOSIS — C649 Malignant neoplasm of unspecified kidney, except renal pelvis: Secondary | ICD-10-CM

## 2020-08-30 HISTORY — PX: IR RADIOLOGIST EVAL & MGMT: IMG5224

## 2020-08-30 NOTE — Consult Note (Signed)
Chief Complaint: Patient was seen in consultation today for renal cell cancer with osseous metastases at the request of Ennever,Vincent David  Referring Physician(s): Ennever,Vincent David  History of Present Illness: Vincent David is a 55 y.o. male with left renal cell carcinoma metastatic to bone.  He has osseous metastases to the left proximal femur, right iliac wing and right aspect of L5.  His disease was diagnosed in 2021 and he has been on immunotherapy since 09/29/19.  Currently on [place].  His bone lesions have been treated with XRT and he underwent prophylactic ORIF of the left proximal femoral lesion.   His disease burden has been relatively stable.  He presents today at the kind request of Dr. Marin David to discuss minimally invasive treatment of his primary left renal lesion.  There are reports of improvement in remote metastatic disease following treatment/debulking of the primary tumor.  This is thought to be due to removal of pro renal cell growth factors admitted by the primary tumor.  Currently, Mr. Vincent David is doing well clinically.  He is quite active.  He does have some chronic pain in his left hip and knee.  Past Medical History:  Diagnosis Date  . Cancer (Vincent David)   . Cancer of kidney (Vincent David)   . Goals of care, counseling/discussion 08/28/2019  . Postictal headache   . Seizure ([place])   . Sleep apnea   . Tinnitus     Past Surgical History:  Procedure Laterality Date  . IR IMAGING GUIDED PORT INSERTION  09/28/2019  . KNEE SURGERY Left   . ORIF PELVIC FRACTURE Left 09/14/2019   Procedure: OPEN REDUCTION INTERNAL FIXATION (ORIF) LEFT HIP WITH AFFIXUS NAIL;  Surgeon: Vincent Pear, MD;  Location: [facility];  Service: Orthopedics;  Laterality: Left;  . SHOULDER SURGERY Left     Allergies: Patient has no known allergies.  Medications: Prior to Admission medications   Medication Sig Start Date End Date Taking? Authorizing Provider  aspirin EC 81 MG tablet Take 1 tablet (81  mg total) by mouth 2 (two) times daily. 09/14/19   Leighton Parody, PA-C  famciclovir (FAMVIR) 500 MG tablet Take 1 tablet (500 mg total) by mouth daily. 07/12/20   Volanda Napoleon, MD  lamoTRIgine (LAMICTAL) 100 MG tablet TAKE 1 TABLET BY MOUTH TWICE A DAY 03/07/20   [name], NP  nicotine (NICODERM CQ - DOSED IN MG/24 HR) 7 mg/24hr patch Place 1 patch (7 mg total) onto the skin daily. 08/28/19   Silverio Decamp, MD  oxyCODONE (OXYCONTIN) 15 mg 12 hr tablet Take 1 tablet (15 mg total) by mouth every 12 (twelve) hours. 06/15/20   Volanda Napoleon, MD  Oxycodone HCl 10 MG TABS Take 1 tablet (10 mg total) by mouth every 4 (four) hours as needed. 06/15/20   Volanda Napoleon, MD  temazepam (RESTORIL) 15 MG capsule Take 1 capsule (15 mg total) by mouth at bedtime as needed for sleep. 01/27/20   Volanda Napoleon, MD     Family History  Problem Relation Age of Onset  . Sleep apnea Mother   . Diabetes Mother   . High blood pressure Father   . Cancer Maternal Uncle        type unknown "either pancreatic or kidney"    Social History   Socioeconomic History  . Marital status: Married    Spouse name: Vincent David  . Number of children: 3  . Years of education: Not on file  . Highest  education level: Not on file  Occupational History    Employer: OTHER  Tobacco Use  . Smoking status: Every Day    Packs/day: 0.50    Types: Cigarettes  . Smokeless tobacco: Never  . Tobacco comments:    using nicotine patch-still smoking.  Vaping Use  . Vaping Use: Never used  Substance and Sexual Activity  . Alcohol use: Yes    Alcohol/week: 6.0 standard drinks    Types: 6 Cans of beer per week  . Drug use: Never  . Sexual activity: Yes    Partners: Female  Other Topics Concern  . Not on file  Social History Narrative  . Not on file   Social Determinants of Health   Financial Resource Strain: Not on file  Food Insecurity: Not on file  Transportation Needs: Not on file  Physical Activity: Not on  file  Stress: Not on file  Social Connections: Not on file    ECOG Status: 1 - Symptomatic but completely ambulatory  Review of Systems: A 12 point ROS discussed and pertinent positives are indicated in the HPI above.  All other systems are negative.  Review of Systems  Vital Signs: There were no vitals taken for this visit.  Physical Exam Constitutional:      General: He is not in acute distress.    Appearance: Normal appearance.  HENT:     Head: Normocephalic and atraumatic.  Eyes:     General: No scleral icterus. Cardiovascular:     Rate and Rhythm: Normal rate and regular rhythm.  Pulmonary:     Effort: Pulmonary effort is normal.  Abdominal:     General: Abdomen is flat.  Skin:    General: Skin is warm and dry.  Neurological:     Mental Status: He is alert and oriented to person, place, and time.  Psychiatric:        Mood and Affect: Mood normal.        Behavior: Behavior normal.      Imaging: No results found.  Labs:  CBC: Recent Labs    05/19/20 1101 06/15/20 1010 07/12/20 1049 08/17/20 0825  WBC 7.6 7.6 8.1 7.2  HGB 15.5 15.4 16.1 15.4  HCT 45.0 44.6 47.1 45.3  PLT 217 212 221 230    COAGS: Recent Labs    09/04/19 0932  INR 1.0    BMP: Recent Labs    09/14/19 1225 09/15/19 0322 09/29/19 0950 10/20/19 1117 11/10/19 0855 05/19/20 1101 06/15/20 1010 07/12/20 1049 08/17/20 0825  NA 136 135 133* 134*   < > 135 136 135 138  K 4.1 4.8 4.4 4.1   < > 4.3 4.0 4.2 4.0  CL 101 98 99 100   < > 103 101 102 103  CO2 '24 26 25 26   '$ < > '26 28 26 27  '$ GLUCOSE 109* 180* 137* 142*   < > 112* 135* 98 171*  BUN '17 16 10 12   '$ < > '19 13 18 '$ 21*  CALCIUM 10.4* 9.6 8.8* 9.0   < > 9.4 9.3 9.6 8.7*  CREATININE 1.00 1.16 0.91 0.85   < > 0.94 1.02 1.01 1.17  GFRNONAA >60 >60 >60 >60   < > >60 >60 >60 >60  GFRAA >60 >60 >60 >60  --   --   --   --   --    < > = values in this interval not displayed.    LIVER FUNCTION TESTS: Recent Labs  05/19/20 1101 06/15/20 1010 07/12/20 1049 08/17/20 0825  BILITOT 0.5 0.4 0.3 0.5  AST 18 13* 14* 17  ALT '14 12 14 15  '$ ALKPHOS 99 109 96 102  PROT 6.4* 6.3* 6.4* 6.2*  ALBUMIN 4.2 4.1 4.2 4.2    TUMOR MARKERS: No results for input(s): AFPTM, CEA, CA199, CHROMGRNA in the last 8760 hours.  Assessment and Plan:  Pleasant 55 year old gentleman with left renal cell carcinoma with multifocal osseous metastatic disease.  He has been doing well on immunotherapy.  His left renal cell carcinoma currently measures 4.0 x 3.3 cm and demonstrates minimal internal contrast-enhancement.  His osseous metastatic disease also remains stable.  This is consistent with oligometastatic disease.  He has no evidence of additional distant metastasis via MRI or PET/CT imaging.  He would be an excellent candidate for percutaneous cryoablation of his primary left interpolar renal lesion.  In addition to treating the primary tumor and preventing/minimizing local progression, there is some evidence that treating the primary lesion can lead to a response at sites of distant metastasis as well.  I explained the risks, benefits and alternatives to percutaneous cryotherapy with Mr. Rehl and his wife.  I was able to answer all of his questions.  He understands and desires to proceed.  1.) Schedule for percutaneous cryoablation of left interpolar renal mass to be performed at [facility].  Anticipate overnight observation.  Thank you for this interesting consult.  I greatly enjoyed meeting [employer] and look forward to participating in their care.  A copy of this report was sent to the requesting provider on this date.  Electronically Signed: Criselda Peaches 08/30/2020, 3:28 PM   I spent a total of  40 Minutes  in face to face in clinical consultation, greater than 50% of which was counseling/coordinating care for metastatic renal cell carcinoma.

## 2020-09-01 ENCOUNTER — Other Ambulatory Visit (HOSPITAL_COMMUNITY): Payer: Self-pay | Admitting: Interventional Radiology

## 2020-09-01 DIAGNOSIS — N2889 Other specified disorders of kidney and ureter: Secondary | ICD-10-CM

## 2020-09-09 ENCOUNTER — Other Ambulatory Visit: Payer: Self-pay | Admitting: Adult Health

## 2020-09-14 NOTE — Progress Notes (Signed)
Sent message, via epic in basket, requesting orders in epic from surgeon.  

## 2020-09-15 NOTE — Patient Instructions (Addendum)
DUE TO COVID-19 ONLY ONE VISITOR IS ALLOWED TO COME WITH YOU AND STAY IN THE WAITING ROOM ONLY DURING PRE OP AND PROCEDURE.   **NO VISITORS ARE ALLOWED IN THE SHORT STAY AREA OR RECOVERY ROOM!!**  IF YOU WILL BE ADMITTED INTO THE HOSPITAL YOU ARE ALLOWED ONLY TWO SUPPORT PEOPLE DURING VISITATION HOURS ONLY (10AM -8PM)   The support person(s) may change daily. The support person(s) must pass our screening, gel in and out, and wear a mask at all times, including in the patient's room. Patients must also wear a mask when staff or their support person are in the room.  No visitors under the age of 71. Any visitor under the age of 54 must be accompanied by an adult.    COVID SWAB TESTING MUST BE COMPLETED ON:  10/03/20 **MUST PRESENT COMPLETED FORM AT TESTING SITE**    [facility] (backside of the building). Open 8am-3pm. No appointment needed. You are not required to quarantine, however you are required to wear a well-fitted mask when you are out and around people not in your household.  Hand Hygiene often Do NOT share personal items Notify your provider if you are in close contact with someone who has COVID or you develop fever 100.4 or greater, new onset of sneezing, cough, sore throat, shortness of breath or body aches.       Your procedure is scheduled on: 10/05/20   Report to [facility] Main  Entrance    Report to admitting at 6:30 AM   Call this number if you have problems the morning of surgery [phone]   Do not eat food :After Midnight.   May have liquids until 5:30 AM day of surgery  CLEAR LIQUID DIET  Foods Allowed                                                                     Foods Excluded  Water, Black Coffee and tea, regular and decaf               liquids that you cannot  Plain Jell-O in any flavor  (No red)                                    see through such as: Fruit ices (not with fruit pulp)                                             milk, soups, orange juice              Iced Popsicles (No red)                                                All solid food  Apple juices Sports drinks like Gatorade (No red) Lightly seasoned clear broth or consume(fat free) Sugar, honey syrup     Oral Hygiene is also important to reduce your risk of infection.                                    Remember - BRUSH YOUR TEETH THE MORNING OF SURGERY WITH YOUR REGULAR TOOTHPASTE   Do NOT smoke after Midnight   Take these medicines the morning of surgery with A SIP OF WATER: Lamictal, Oxycodone.                     You may not have any metal on your body including jewelry, and body piercing             Do not wear lotions, powders, cologne, or deodorant              Men may shave face and neck.   Do not bring valuables to the hospital. [place].   Contacts, dentures or bridgework may not be worn into surgery.   Bring small overnight bag day of surgery.    Please bring CPAP mask and tubing day of surgery.   Ask you primary care doctor for instructions regarding Aspirin before surgery.  Please read over the following fact sheets you were given: IF YOU HAVE QUESTIONS ABOUT YOUR PRE OP INSTRUCTIONS PLEASE CALL [facility] - Preparing for Surgery Before surgery, you can play an important role.  Because skin is not sterile, your skin needs to be as free of germs as possible.  You can reduce the number of germs on your skin by washing with CHG (chlorahexidine gluconate) soap before surgery.  CHG is an antiseptic cleaner which kills germs and bonds with the skin to continue killing germs even after washing. Please DO NOT use if you have an allergy to CHG or antibacterial soaps.  If your skin becomes reddened/irritated stop using the CHG and inform your nurse when you arrive at Short Stay. Do not shave (including legs and  underarms) for at least 48 hours prior to the first CHG shower.  You may shave your face/neck.  Please follow these instructions carefully:  1.  Shower with CHG Soap the night before surgery and the  morning of surgery.  2.  If you choose to wash your hair, wash your hair first as usual with your normal  shampoo.  3.  After you shampoo, rinse your hair and body thoroughly to remove the shampoo.                             4.  Use CHG as you would any other liquid soap.  You can apply chg directly to the skin and wash.  Gently with a scrungie or clean washcloth.  5.  Apply the CHG Soap to your body ONLY FROM THE NECK DOWN.   Do   not use on face/ open                           Wound or open sores. Avoid contact with eyes, ears mouth and   genitals (private parts).  Wash face,  Genitals (private parts) with your normal soap.             6.  Wash thoroughly, paying special attention to the area where your    surgery  will be performed.  7.  Thoroughly rinse your body with warm water from the neck down.  8.  DO NOT shower/wash with your normal soap after using and rinsing off the CHG Soap.                9.  Pat yourself dry with a clean towel.            10.  Wear clean pajamas.            11.  Place clean sheets on your bed the night of your first shower and do not  sleep with pets. Day of Surgery : Do not apply any lotions/deodorants the morning of surgery.  Please wear clean clothes to the hospital/surgery center.  FAILURE TO FOLLOW THESE INSTRUCTIONS MAY RESULT IN THE CANCELLATION OF YOUR SURGERY  PATIENT SIGNATURE_________________________________  NURSE SIGNATURE__________________________________  ________________________________________________________________________

## 2020-09-15 NOTE — Progress Notes (Addendum)
COVID Vaccine Completed: yes x23 Date COVID Vaccine completed: 06/01/19, 06/22/19 Has received booster: COVID vaccine manufacturer: Pfizer       Date of COVID positive in last 90 days: No  PCP - Emeterio Reeve, DO Cardiologist - N/a  Chest x-ray - n/a EKG - n/a Stress Test - long time ago ECHO - N/a Cardiac Cath - N/a Pacemaker/ICD device last checked: N/a Spinal Cord Stimulator: N/a  Sleep Study - yes positive CPAP - yes  Fasting Blood Sugar - N/a Checks Blood Sugar _____ times a day  Blood Thinner Instructions:  Aspirin Instructions: ASA '81mg'$ , no instructions. Instructed to call PCP and ask about holding Last Dose:  Activity level: Can go up a flight of stairs and perform activities of daily living without stopping and without symptoms of chest pain or shortness of breath.       Anesthesia review:   Patient denies shortness of breath, fever, cough and chest pain at PAT appointment   Patient verbalized understanding of instructions that were given to them at the PAT appointment. Patient was also instructed that they will need to review over the PAT instructions again at home before surgery.

## 2020-09-16 ENCOUNTER — Encounter (HOSPITAL_COMMUNITY): Admission: RE | Admit: 2020-09-16 | Payer: BC Managed Care – PPO | Source: Ambulatory Visit

## 2020-09-20 ENCOUNTER — Other Ambulatory Visit: Payer: Self-pay

## 2020-09-20 ENCOUNTER — Encounter (HOSPITAL_COMMUNITY): Payer: Self-pay

## 2020-09-20 ENCOUNTER — Encounter (HOSPITAL_COMMUNITY)
Admission: RE | Admit: 2020-09-20 | Discharge: 2020-09-20 | Disposition: A | Discharge status: Home / Self Care | Payer: BC Managed Care – PPO | Source: Ambulatory Visit | Attending: Interventional Radiology | Admitting: Interventional Radiology

## 2020-09-20 DIAGNOSIS — Z01812 Encounter for preprocedural laboratory examination: Secondary | ICD-10-CM | POA: Insufficient documentation

## 2020-09-20 LAB — CBC
HCT: 51.1 % (ref 39.0–52.0)
Hemoglobin: 17.3 g/dL — ABNORMAL HIGH (ref 13.0–17.0)
MCH: 31.6 pg (ref 26.0–34.0)
MCHC: 33.9 g/dL (ref 30.0–36.0)
MCV: 93.4 fL (ref 80.0–100.0)
Platelets: 236 10*3/uL (ref 150–400)
RBC: 5.47 MIL/uL (ref 4.22–5.81)
RDW: 13.7 % (ref 11.5–15.5)
WBC: 8.8 10*3/uL (ref 4.0–10.5)
nRBC: 0 % (ref 0.0–0.2)

## 2020-09-20 LAB — BASIC METABOLIC PANEL
Anion gap: 10 (ref 5–15)
BUN: 16 mg/dL (ref 6–20)
CO2: 23 mmol/L (ref 22–32)
Calcium: 9.4 mg/dL (ref 8.9–10.3)
Chloride: 103 mmol/L (ref 98–111)
Creatinine, Ser: 0.9 mg/dL (ref 0.61–1.24)
GFR, Estimated: >60 mL/min (ref >60)
Glucose, Bld: 112 mg/dL — ABNORMAL HIGH (ref 70–99)
Potassium: 4.5 mmol/L (ref 3.5–5.1)
Sodium: 136 mmol/L (ref 135–145)

## 2020-09-22 ENCOUNTER — Telehealth: Payer: Self-pay

## 2020-09-22 ENCOUNTER — Telehealth: Payer: Self-pay | Admitting: *Deleted

## 2020-09-22 NOTE — Telephone Encounter (Signed)
Message received from patient's wife Otila Kluver to inform Dr. Marin Olp that pt will need to go out of town next week for a family emergency and would like to know if patient can come in this week for treatment.  Dr. Marin Olp notified and order received for pt to come in tomorrow for labs, infusion and no MD visit.  Message sent to scheduling.

## 2020-09-23 ENCOUNTER — Inpatient Hospital Stay: Payer: BC Managed Care – PPO | Attending: Hematology & Oncology

## 2020-09-23 ENCOUNTER — Inpatient Hospital Stay: Payer: BC Managed Care – PPO

## 2020-09-23 ENCOUNTER — Other Ambulatory Visit: Payer: Self-pay

## 2020-09-23 DIAGNOSIS — Z5112 Encounter for antineoplastic immunotherapy: Secondary | ICD-10-CM | POA: Diagnosis not present

## 2020-09-23 DIAGNOSIS — Z79899 Other long term (current) drug therapy: Secondary | ICD-10-CM | POA: Diagnosis not present

## 2020-09-23 DIAGNOSIS — C642 Malignant neoplasm of left kidney, except renal pelvis: Secondary | ICD-10-CM | POA: Diagnosis not present

## 2020-09-23 DIAGNOSIS — C7951 Secondary malignant neoplasm of bone: Secondary | ICD-10-CM | POA: Diagnosis not present

## 2020-09-23 LAB — LACTATE DEHYDROGENASE: LDH: 152 U/L (ref 98–192)

## 2020-09-23 LAB — CBC WITH DIFFERENTIAL (CANCER CENTER ONLY)
Abs Immature Granulocytes: 0.04 10*3/uL (ref 0.00–0.07)
Basophils Absolute: 0 10*3/uL (ref 0.0–0.1)
Basophils Relative: 0 %
Eosinophils Absolute: 0.2 10*3/uL (ref 0.0–0.5)
Eosinophils Relative: 3 %
HCT: 45.9 % (ref 39.0–52.0)
Hemoglobin: 16 g/dL (ref 13.0–17.0)
Immature Granulocytes: 1 %
Lymphocytes Relative: 10 %
Lymphs Abs: 0.8 10*3/uL (ref 0.7–4.0)
MCH: 32.6 pg (ref 26.0–34.0)
MCHC: 34.9 g/dL (ref 30.0–36.0)
MCV: 93.5 fL (ref 80.0–100.0)
Monocytes Absolute: 0.3 10*3/uL (ref 0.1–1.0)
Monocytes Relative: 4 %
Neutro Abs: 6.7 10*3/uL (ref 1.7–7.7)
Neutrophils Relative %: 82 %
Platelet Count: 211 10*3/uL (ref 150–400)
RBC: 4.91 MIL/uL (ref 4.22–5.81)
RDW: 13.7 % (ref 11.5–15.5)
WBC Count: 8.1 10*3/uL (ref 4.0–10.5)
nRBC: 0 % (ref 0.0–0.2)

## 2020-09-23 LAB — CMP (CANCER CENTER ONLY)
ALT: 15 U/L (ref 0–44)
AST: 16 U/L (ref 15–41)
Albumin: 4.3 g/dL (ref 3.5–5.0)
Alkaline Phosphatase: 98 U/L (ref 38–126)
Anion gap: 9 (ref 5–15)
BUN: 17 mg/dL (ref 6–20)
CO2: 27 mmol/L (ref 22–32)
Calcium: 8.9 mg/dL (ref 8.9–10.3)
Chloride: 101 mmol/L (ref 98–111)
Creatinine: 1.13 mg/dL (ref 0.61–1.24)
GFR, Estimated: >60 mL/min (ref >60)
Glucose, Bld: 189 mg/dL — ABNORMAL HIGH (ref 70–99)
Potassium: 4.1 mmol/L (ref 3.5–5.1)
Sodium: 137 mmol/L (ref 135–145)
Total Bilirubin: 0.5 mg/dL (ref 0.3–1.2)
Total Protein: 6.8 g/dL (ref 6.5–8.1)

## 2020-09-23 LAB — TSH: TSH: 0.917 u[IU]/mL (ref 0.320–4.118)

## 2020-09-23 MED ORDER — HEPARIN SOD (PORK) LOCK FLUSH 100 UNIT/ML IV SOLN
500.0000 [IU] | Freq: Once | INTRAVENOUS | Status: AC | PRN
  Administered 2020-09-23: 500 [IU]

## 2020-09-23 MED ORDER — SODIUM CHLORIDE 0.9 % IV SOLN
480.0000 mg | Freq: Once | INTRAVENOUS | Status: AC
  Administered 2020-09-23: 480 mg via INTRAVENOUS
  Filled 2020-09-23: qty 48

## 2020-09-23 MED ORDER — SODIUM CHLORIDE 0.9 % IV SOLN: Freq: Once | INTRAVENOUS | Status: AC

## 2020-09-23 MED ORDER — SODIUM CHLORIDE 0.9% FLUSH
10.0000 mL | INTRAVENOUS | Status: DC | PRN
  Administered 2020-09-23: 10 mL

## 2020-09-23 NOTE — Patient Instructions (Signed)
[facility]  Discharge Instructions: Thank you for choosing [facility] to provide your oncology and hematology care.   If you have a lab appointment with the Cancer Center, please go directly to the Cancer Center and check in at the registration area.  Wear comfortable clothing and clothing appropriate for easy access to any Portacath or PICC line.   We strive to give you quality time with your provider. You may need to reschedule your appointment if you arrive late (15 or more minutes).  Arriving late affects you and other patients whose appointments are after yours.  Also, if you miss three or more appointments without notifying the office, you may be dismissed from the clinic at the provider's discretion.      For prescription refill requests, have your pharmacy contact our office and allow 72 hours for refills to be completed.    Today you received the following chemotherapy and/or immunotherapy agents:  Opdivo      To help prevent nausea and vomiting after your treatment, we encourage you to take your nausea medication as directed.  BELOW ARE SYMPTOMS THAT SHOULD BE REPORTED IMMEDIATELY: *FEVER GREATER THAN 100.4 F (38 C) OR HIGHER *CHILLS OR SWEATING *NAUSEA AND VOMITING THAT IS NOT CONTROLLED WITH YOUR NAUSEA MEDICATION *UNUSUAL SHORTNESS OF BREATH *UNUSUAL BRUISING OR BLEEDING *URINARY PROBLEMS (pain or burning when urinating, or frequent urination) *BOWEL PROBLEMS (unusual diarrhea, constipation, pain near the anus) TENDERNESS IN MOUTH AND THROAT WITH OR WITHOUT PRESENCE OF ULCERS (sore throat, sores in mouth, or a toothache) UNUSUAL RASH, SWELLING OR PAIN  UNUSUAL VAGINAL DISCHARGE OR ITCHING   Items with * indicate a potential emergency and should be followed up as soon as possible or go to the Emergency Department if any problems should occur.  Please show the CHEMOTHERAPY ALERT CARD or IMMUNOTHERAPY ALERT CARD at check-in to the  Emergency Department and triage nurse. Should you have questions after your visit or need to cancel or reschedule your appointment, please contact [facility]  [phone] and follow the prompts.  Office hours are 8:00 a.m. to 4:30 p.m. Monday - Friday. Please note that voicemails left after 4:00 p.m. may not be returned until the following business day.  We are closed weekends and major holidays. You have access to a nurse at all times for urgent questions. Please call the main number to the clinic [phone] and follow the prompts.  For any non-urgent questions, you may also contact your provider using MyChart. We now offer e-Visits for anyone 18 and older to request care online for non-urgent symptoms. For details visit [email or web address].   Also download the MyChart app! Go to the app store, search "MyChart", open the app, select [facility], and log in with your MyChart username and password.  Due to Covid, a mask is required upon entering the hospital/clinic. If you do not have a mask, one will be given to you upon arrival. For doctor visits, patients may have 1 support person aged 18 or older with them. For treatment visits, patients cannot have anyone with them due to current Covid guidelines and our immunocompromised population.  

## 2020-09-23 NOTE — Patient Instructions (Signed)
Implanted Port Home Guide An implanted port is a device that is placed under the skin. It is usually placed in the chest. The device can be used to give IV medicine, to take blood, or for dialysis. You may have an implanted port if: You need IV medicine that would be irritating to the small veins in your hands or arms. You need IV medicines, such as antibiotics, for a long period of time. You need IV nutrition for a long period of time. You need dialysis. When you have a port, your health care provider can choose to use the port instead of veins in your arms for these procedures. You may have fewer limitations when using a port than you would if you used other types of long-term IVs, and you will likely be able to return to normal activities afteryour incision heals. An implanted port has two main parts: Reservoir. The reservoir is the part where a needle is inserted to give medicines or draw blood. The reservoir is round. After it is placed, it appears as a small, raised area under your skin. Catheter. The catheter is a thin, flexible tube that connects the reservoir to a vein. Medicine that is inserted into the reservoir goes into the catheter and then into the vein. How is my port accessed? To access your port: A numbing cream may be placed on the skin over the port site. Your health care provider will put on a mask and sterile gloves. The skin over your port will be cleaned carefully with a germ-killing soap and allowed to dry. Your health care provider will gently pinch the port and insert a needle into it. Your health care provider will check for a blood return to make sure the port is in the vein and is not clogged. If your port needs to remain accessed to get medicine continuously (constant infusion), your health care provider will place a clear bandage (dressing) over the needle site. The dressing and needle will need to be changed every week, or as told by your health care provider. What  is flushing? Flushing helps keep the port from getting clogged. Follow instructions from your health care provider about how and when to flush the port. Ports are usually flushed with saline solution or a medicine called heparin. The need for flushing will depend on how the port is used: If the port is only used from time to time to give medicines or draw blood, the port may need to be flushed: Before and after medicines have been given. Before and after blood has been drawn. As part of routine maintenance. Flushing may be recommended every 4-6 weeks. If a constant infusion is running, the port may not need to be flushed. Throw away any syringes in a disposal container that is meant for sharp items (sharps container). You can buy a sharps container from a pharmacy, or you can make one by using an empty hard plastic bottle with a cover. How long will my port stay implanted? The port can stay in for as long as your health care provider thinks it is needed. When it is time for the port to come out, a surgery will be done to remove it. The surgery will be similar to the procedure that was done to putthe port in. Follow these instructions at home:  Flush your port as told by your health care provider. If you need an infusion over several days, follow instructions from your health care provider about how to take   care of your port site. Make sure you: Wash your hands with soap and water before you change your dressing. If soap and water are not available, use alcohol-based hand sanitizer. Change your dressing as told by your health care provider. Place any used dressings or infusion bags into a plastic bag. Throw that bag in the trash. Keep the dressing that covers the needle clean and dry. Do not get it wet. Do not use scissors or sharp objects near the tube. Keep the tube clamped, unless it is being used. Check your port site every day for signs of infection. Check for: Redness, swelling, or  pain. Fluid or blood. Pus or a bad smell. Protect the skin around the port site. Avoid wearing bra straps that rub or irritate the site. Protect the skin around your port from seat belts. Place a soft pad over your chest if needed. Bathe or shower as told by your health care provider. The site may get wet as long as you are not actively receiving an infusion. Return to your normal activities as told by your health care provider. Ask your health care provider what activities are safe for you. Carry a medical alert card or wear a medical alert bracelet at all times. This will let health care providers know that you have an implanted port in case of an emergency. Get help right away if: You have redness, swelling, or pain at the port site. You have fluid or blood coming from your port site. You have pus or a bad smell coming from the port site. You have a fever. Summary Implanted ports are usually placed in the chest for long-term IV access. Follow instructions from your health care provider about flushing the port and changing bandages (dressings). Take care of the area around your port by avoiding clothing that puts pressure on the area, and by watching for signs of infection. Protect the skin around your port from seat belts. Place a soft pad over your chest if needed. Get help right away if you have a fever or you have redness, swelling, pain, drainage, or a bad smell at the port site. This information is not intended to replace advice given to you by your health care provider. Make sure you discuss any questions you have with your healthcare provider. Document Revised: 06/08/2019 Document Reviewed: 06/08/2019 Elsevier Patient Education  2022 Elsevier Inc.  

## 2020-09-24 LAB — T4: T4, Total: 5.9 ug/dL (ref 4.5–12.0)

## 2020-09-28 ENCOUNTER — Inpatient Hospital Stay: Payer: BC Managed Care – PPO

## 2020-09-28 ENCOUNTER — Inpatient Hospital Stay: Payer: BC Managed Care – PPO | Admitting: Hematology & Oncology

## 2020-10-03 ENCOUNTER — Other Ambulatory Visit: Payer: Self-pay

## 2020-10-03 DIAGNOSIS — Z8616 Personal history of COVID-19: Secondary | ICD-10-CM

## 2020-10-03 HISTORY — DX: Personal history of COVID-19: Z86.16

## 2020-10-04 ENCOUNTER — Encounter: Payer: Self-pay | Admitting: Hematology & Oncology

## 2020-10-04 ENCOUNTER — Telehealth (HOSPITAL_COMMUNITY): Payer: Self-pay | Admitting: Interventional Radiology

## 2020-10-04 LAB — SARS CORONAVIRUS 2 (TAT 6-24 HRS): SARS Coronavirus 2: POSITIVE — AB

## 2020-10-04 NOTE — Telephone Encounter (Signed)
Contacted patient in regards to + Covid test and Cryo needing to be rescheduled from 8/31.  Patient is aware of the above and has been rescheduled to 10/19/20 as requested per [place].

## 2020-10-04 NOTE — Progress Notes (Signed)
Vincent David at [facility] notified of covid test result.

## 2020-10-05 ENCOUNTER — Ambulatory Visit (HOSPITAL_COMMUNITY): Payer: BC Managed Care – PPO

## 2020-10-12 ENCOUNTER — Encounter (HOSPITAL_COMMUNITY): Payer: Self-pay

## 2020-10-12 NOTE — Progress Notes (Signed)
Vincent David aware to arrive at 9:45 AM 10/19/20 May have liquids until 9:00 AM day of procedure, reviewed pre op instructions he verbalized understanding. No changes in his allergies or medications since pre op appointment. I add positive covid on 10/03/20 otherwise no additional changes to medical history since previous appointment.

## 2020-10-17 ENCOUNTER — Other Ambulatory Visit: Payer: Self-pay | Admitting: Radiology

## 2020-10-18 ENCOUNTER — Encounter (HOSPITAL_COMMUNITY): Payer: Self-pay | Admitting: Anesthesiology

## 2020-10-18 NOTE — Anesthesia Preprocedure Evaluation (Addendum)
Anesthesia Evaluation  Patient identified by MRN, date of birth, ID band Patient awake    Reviewed: Allergy & Precautions, NPO status , Patient's Chart, lab work & pertinent test results  Airway Mallampati: II  TM Distance: >3 FB Neck ROM: Full    Dental no notable dental hx. (+) Teeth Intact, Dental Advisory Given,    Pulmonary sleep apnea and Continuous Positive Airway Pressure Ventilation , Current SmokerPatient did not abstain from smoking.,    Pulmonary exam normal breath sounds clear to auscultation       Cardiovascular Exercise Tolerance: Good Normal cardiovascular exam Rhythm:Regular Rate:Normal     Neuro/Psych Seizures -, Well Controlled,     GI/Hepatic negative GI ROS, Neg liver ROS,   Endo/Other    Renal/GU Renal diseaseLab Results      Component                Value               Date                      CREATININE               1.05                10/19/2020                BUN                      18                  10/19/2020                NA                       140                 10/19/2020                K                        4.2                 10/19/2020                CL                       105                 10/19/2020                CO2                      25                  10/19/2020              Hx renal cell Ca w L renal mass    Musculoskeletal   Abdominal   Peds  Hematology Lab Results      Component                Value               Date                      WBC  7.5                 10/19/2020                HGB                      16.8                10/19/2020                HCT                      49.5                10/19/2020                MCV                      95.4                10/19/2020                PLT                      220                 10/19/2020              Anesthesia Other Findings   Reproductive/Obstetrics                            Anesthesia Physical Anesthesia Plan  ASA: 3  Anesthesia Plan: General   Post-op Pain Management:    Induction: Intravenous  PONV Risk Score and Plan: 2 and Treatment may vary due to age or medical condition, Midazolam and Ondansetron  Airway Management Planned: Oral ETT  Additional Equipment: None  Intra-op Plan:   Post-operative Plan: Extubation in OR  Informed Consent: I have reviewed the patients History and Physical, chart, labs and discussed the procedure including the risks, benefits and alternatives for the proposed anesthesia with the patient or authorized representative who has indicated his/her understanding and acceptance.     Dental advisory given  Plan Discussed with: CRNA  Anesthesia Plan Comments:        Anesthesia Quick Evaluation

## 2020-10-19 ENCOUNTER — Ambulatory Visit (HOSPITAL_COMMUNITY)
Admission: RE | Admit: 2020-10-19 | Discharge: 2020-10-19 | Disposition: A | Discharge status: Home / Self Care | Payer: BC Managed Care – PPO | Source: Ambulatory Visit | Attending: Interventional Radiology | Admitting: Interventional Radiology

## 2020-10-19 ENCOUNTER — Ambulatory Visit (HOSPITAL_COMMUNITY): Payer: BC Managed Care – PPO | Admitting: Anesthesiology

## 2020-10-19 ENCOUNTER — Observation Stay (HOSPITAL_COMMUNITY)
Admission: RE | Admit: 2020-10-19 | Discharge: 2020-10-20 | Disposition: A | Discharge status: Home / Self Care | Payer: BC Managed Care – PPO | Source: Ambulatory Visit | Attending: Interventional Radiology | Admitting: Interventional Radiology

## 2020-10-19 ENCOUNTER — Other Ambulatory Visit: Payer: Self-pay

## 2020-10-19 ENCOUNTER — Encounter (HOSPITAL_COMMUNITY)
Admission: RE | Disposition: A | Discharge status: Home / Self Care | Payer: Self-pay | Source: Ambulatory Visit | Attending: Interventional Radiology

## 2020-10-19 ENCOUNTER — Encounter (HOSPITAL_COMMUNITY): Payer: Self-pay

## 2020-10-19 ENCOUNTER — Ambulatory Visit (HOSPITAL_COMMUNITY): Payer: BC Managed Care – PPO

## 2020-10-19 DIAGNOSIS — C642 Malignant neoplasm of left kidney, except renal pelvis: Secondary | ICD-10-CM | POA: Diagnosis present

## 2020-10-19 DIAGNOSIS — Z9989 Dependence on other enabling machines and devices: Secondary | ICD-10-CM | POA: Diagnosis not present

## 2020-10-19 DIAGNOSIS — C649 Malignant neoplasm of unspecified kidney, except renal pelvis: Secondary | ICD-10-CM | POA: Insufficient documentation

## 2020-10-19 DIAGNOSIS — Z01818 Encounter for other preprocedural examination: Secondary | ICD-10-CM | POA: Diagnosis not present

## 2020-10-19 DIAGNOSIS — G4733 Obstructive sleep apnea (adult) (pediatric): Secondary | ICD-10-CM | POA: Diagnosis not present

## 2020-10-19 DIAGNOSIS — C7951 Secondary malignant neoplasm of bone: Secondary | ICD-10-CM | POA: Insufficient documentation

## 2020-10-19 DIAGNOSIS — F17210 Nicotine dependence, cigarettes, uncomplicated: Secondary | ICD-10-CM | POA: Diagnosis not present

## 2020-10-19 DIAGNOSIS — E782 Mixed hyperlipidemia: Secondary | ICD-10-CM | POA: Diagnosis not present

## 2020-10-19 DIAGNOSIS — N2889 Other specified disorders of kidney and ureter: Secondary | ICD-10-CM | POA: Insufficient documentation

## 2020-10-19 HISTORY — PX: RADIOLOGY WITH ANESTHESIA: SHX6223

## 2020-10-19 LAB — CBC WITH DIFFERENTIAL/PLATELET
Abs Immature Granulocytes: 0.09 10*3/uL — ABNORMAL HIGH (ref 0.00–0.07)
Basophils Absolute: 0 10*3/uL (ref 0.0–0.1)
Basophils Relative: 1 %
Eosinophils Absolute: 0.3 10*3/uL (ref 0.0–0.5)
Eosinophils Relative: 4 %
HCT: 49.5 % (ref 39.0–52.0)
Hemoglobin: 16.8 g/dL (ref 13.0–17.0)
Immature Granulocytes: 1 %
Lymphocytes Relative: 15 %
Lymphs Abs: 1.1 10*3/uL (ref 0.7–4.0)
MCH: 32.4 pg (ref 26.0–34.0)
MCHC: 33.9 g/dL (ref 30.0–36.0)
MCV: 95.4 fL (ref 80.0–100.0)
Monocytes Absolute: 0.4 10*3/uL (ref 0.1–1.0)
Monocytes Relative: 5 %
Neutro Abs: 5.5 10*3/uL (ref 1.7–7.7)
Neutrophils Relative %: 74 %
Platelets: 220 10*3/uL (ref 150–400)
RBC: 5.19 MIL/uL (ref 4.22–5.81)
RDW: 13.7 % (ref 11.5–15.5)
WBC: 7.5 10*3/uL (ref 4.0–10.5)
nRBC: 0 % (ref 0.0–0.2)

## 2020-10-19 LAB — TYPE AND SCREEN
ABO/RH(D): O POS
Antibody Screen: NEGATIVE

## 2020-10-19 LAB — BASIC METABOLIC PANEL
Anion gap: 10 (ref 5–15)
BUN: 18 mg/dL (ref 6–20)
CO2: 25 mmol/L (ref 22–32)
Calcium: 9.3 mg/dL (ref 8.9–10.3)
Chloride: 105 mmol/L (ref 98–111)
Creatinine, Ser: 1.05 mg/dL (ref 0.61–1.24)
GFR, Estimated: >60 mL/min (ref >60)
Glucose, Bld: 110 mg/dL — ABNORMAL HIGH (ref 70–99)
Potassium: 4.2 mmol/L (ref 3.5–5.1)
Sodium: 140 mmol/L (ref 135–145)

## 2020-10-19 LAB — PROTIME-INR
INR: 1 (ref 0.8–1.2)
Prothrombin Time: 12.8 seconds (ref 11.4–15.2)

## 2020-10-19 LAB — ABO/RH: ABO/RH(D): O POS

## 2020-10-19 SURGERY — CT WITH ANESTHESIA
Anesthesia: General

## 2020-10-19 MED ORDER — AMISULPRIDE (ANTIEMETIC) 5 MG/2ML IV SOLN: 10.0000 mg | Freq: Once | INTRAVENOUS | Status: DC | PRN

## 2020-10-19 MED ORDER — OXYCODONE HCL 5 MG/5ML PO SOLN
5.0000 mg | Freq: Once | ORAL | Status: DC | PRN
Start: 2020-10-19 — End: 2020-10-19

## 2020-10-19 MED ORDER — ONDANSETRON HCL 4 MG/2ML IJ SOLN: 4.0000 mg | Freq: Once | INTRAMUSCULAR | Status: DC | PRN

## 2020-10-19 MED ORDER — FENTANYL CITRATE (PF) 100 MCG/2ML IJ SOLN
INTRAMUSCULAR | Status: DC | PRN
  Administered 2020-10-19: 100 ug via INTRAVENOUS
  Administered 2020-10-19 (×3): 50 ug via INTRAVENOUS

## 2020-10-19 MED ORDER — ROCURONIUM BROMIDE 10 MG/ML (PF) SYRINGE
PREFILLED_SYRINGE | INTRAVENOUS | Status: DC | PRN
  Administered 2020-10-19: 10 mg via INTRAVENOUS
  Administered 2020-10-19: 30 mg via INTRAVENOUS
  Administered 2020-10-19: 40 mg via INTRAVENOUS
  Administered 2020-10-19: 60 mg via INTRAVENOUS
  Administered 2020-10-19: 10 mg via INTRAVENOUS

## 2020-10-19 MED ORDER — OXYCODONE HCL 10 MG PO TABS
10.0000 mg | ORAL_TABLET | ORAL | Status: DC | PRN
Start: 2020-10-19 — End: 2020-10-19

## 2020-10-19 MED ORDER — HYDROMORPHONE HCL 1 MG/ML IJ SOLN: 0.2500 mg | INTRAMUSCULAR | Status: DC | PRN

## 2020-10-19 MED ORDER — SODIUM CHLORIDE 0.9 % IV SOLN
2.0000 g | Freq: Once | INTRAVENOUS | Status: AC
  Administered 2020-10-19: 2 g via INTRAVENOUS
  Filled 2020-10-19: qty 2

## 2020-10-19 MED ORDER — IOHEXOL 350 MG/ML SOLN
75.0000 mL | Freq: Once | INTRAVENOUS | Status: AC | PRN
  Administered 2020-10-19: 75 mL via INTRAVENOUS

## 2020-10-19 MED ORDER — HYDROCODONE-ACETAMINOPHEN 5-325 MG PO TABS: 1.0000 | ORAL_TABLET | ORAL | Status: DC | PRN

## 2020-10-19 MED ORDER — TEMAZEPAM 15 MG PO CAPS: 15.0000 mg | ORAL_CAPSULE | Freq: Every evening | ORAL | Status: DC | PRN

## 2020-10-19 MED ORDER — SODIUM CHLORIDE 0.9 % IV SOLN
INTRAVENOUS | Status: AC
  Filled 2020-10-19: qty 250

## 2020-10-19 MED ORDER — LIDOCAINE 2% (20 MG/ML) 5 ML SYRINGE
INTRAMUSCULAR | Status: DC | PRN
Start: 2020-10-19 — End: 2020-10-19
  Administered 2020-10-19: 100 mg via INTRAVENOUS

## 2020-10-19 MED ORDER — PROPOFOL 10 MG/ML IV BOLUS
INTRAVENOUS | Status: DC | PRN
Start: 2020-10-19 — End: 2020-10-19
  Administered 2020-10-19: 170 mg via INTRAVENOUS

## 2020-10-19 MED ORDER — DOCUSATE SODIUM 100 MG PO CAPS
100.0000 mg | ORAL_CAPSULE | Freq: Two times a day (BID) | ORAL | Status: DC
  Administered 2020-10-19: 100 mg via ORAL
  Filled 2020-10-19 (×2): qty 1

## 2020-10-19 MED ORDER — LAMOTRIGINE 100 MG PO TABS
100.0000 mg | ORAL_TABLET | Freq: Two times a day (BID) | ORAL | Status: DC
  Administered 2020-10-19: 100 mg via ORAL
  Filled 2020-10-19 (×2): qty 1

## 2020-10-19 MED ORDER — FENTANYL CITRATE (PF) 250 MCG/5ML IJ SOLN
INTRAMUSCULAR | Status: AC
  Filled 2020-10-19: qty 5

## 2020-10-19 MED ORDER — ACETAMINOPHEN 10 MG/ML IV SOLN: 1000.0000 mg | Freq: Once | INTRAVENOUS | Status: DC | PRN

## 2020-10-19 MED ORDER — CHLORHEXIDINE GLUCONATE 0.12 % MT SOLN
15.0000 mL | Freq: Once | OROMUCOSAL | Status: AC
  Administered 2020-10-19: 15 mL via OROMUCOSAL

## 2020-10-19 MED ORDER — SUGAMMADEX SODIUM 200 MG/2ML IV SOLN
INTRAVENOUS | Status: DC | PRN
Start: 2020-10-19 — End: 2020-10-19
  Administered 2020-10-19: 200 mg via INTRAVENOUS

## 2020-10-19 MED ORDER — ORAL CARE MOUTH RINSE: 15.0000 mL | Freq: Once | OROMUCOSAL | Status: AC

## 2020-10-19 MED ORDER — LACTATED RINGERS IV SOLN: INTRAVENOUS | Status: DC

## 2020-10-19 MED ORDER — MIDAZOLAM HCL 2 MG/2ML IJ SOLN
INTRAMUSCULAR | Status: AC
  Filled 2020-10-19: qty 2

## 2020-10-19 MED ORDER — SODIUM CHLORIDE 0.9 % IV SOLN: INTRAVENOUS | Status: DC

## 2020-10-19 MED ORDER — OXYCODONE HCL 5 MG PO TABS
10.0000 mg | ORAL_TABLET | ORAL | Status: DC | PRN
Start: 2020-10-19 — End: 2020-10-20

## 2020-10-19 MED ORDER — ONDANSETRON HCL 4 MG/2ML IJ SOLN
INTRAMUSCULAR | Status: DC | PRN
  Administered 2020-10-19: 4 mg via INTRAVENOUS

## 2020-10-19 MED ORDER — DEXAMETHASONE SODIUM PHOSPHATE 10 MG/ML IJ SOLN
INTRAMUSCULAR | Status: DC | PRN
  Administered 2020-10-19: 10 mg via INTRAVENOUS

## 2020-10-19 MED ORDER — MIDAZOLAM HCL 5 MG/5ML IJ SOLN
INTRAMUSCULAR | Status: DC | PRN
  Administered 2020-10-19: 2 mg via INTRAVENOUS

## 2020-10-19 MED ORDER — PHENYLEPHRINE HCL-NACL 20-0.9 MG/250ML-% IV SOLN
INTRAVENOUS | Status: DC | PRN
  Administered 2020-10-19: 20 ug/min via INTRAVENOUS

## 2020-10-19 MED ORDER — OXYCODONE HCL ER 15 MG PO T12A
15.0000 mg | EXTENDED_RELEASE_TABLET | Freq: Two times a day (BID) | ORAL | Status: DC | PRN
Start: 2020-10-19 — End: 2020-10-20

## 2020-10-19 MED ORDER — ONDANSETRON HCL 4 MG/2ML IJ SOLN: 4.0000 mg | Freq: Four times a day (QID) | INTRAMUSCULAR | Status: DC | PRN

## 2020-10-19 MED ORDER — OXYCODONE HCL 5 MG PO TABS
5.0000 mg | ORAL_TABLET | Freq: Once | ORAL | Status: DC | PRN
Start: 2020-10-19 — End: 2020-10-19

## 2020-10-19 NOTE — Transfer of Care (Signed)
Immediate Anesthesia Transfer of Care Note  Patient: Vincent David  Procedure(s) Performed: Procedure(s): CT CYROABLATION WITH ANESTHESIA (N/A)  Patient Location: PACU  Anesthesia Type:General  Level of Consciousness:  sedated, patient cooperative and responds to stimulation  Airway & Oxygen Therapy:Patient Spontanous Breathing and Patient connected to face mask oxgen  Post-op Assessment:  Report given to PACU RN and Post -op Vital signs reviewed and stable  Post vital signs:  Reviewed and stable  Last Vitals:  Vitals:   10/19/20 1009  BP: (!) 166/84  Pulse: 77  Resp: 18  Temp: 37.2 C  SpO2: [id number]    Complications: No apparent anesthesia complications

## 2020-10-19 NOTE — Anesthesia Postprocedure Evaluation (Signed)
Anesthesia Post Note  Patient: Vincent David  Procedure(s) Performed: CT CYROABLATION WITH ANESTHESIA     Patient location during evaluation: PACU Anesthesia Type: General Level of consciousness: awake and alert Pain management: pain level controlled Vital Signs Assessment: post-procedure vital signs reviewed and stable Respiratory status: spontaneous breathing, nonlabored ventilation, respiratory function stable and patient connected to nasal cannula oxygen Cardiovascular status: blood pressure returned to baseline and stable Postop Assessment: no apparent nausea or vomiting Anesthetic complications: no   No notable events documented.  Last Vitals:  Vitals:   10/19/20 1709 10/19/20 1733  BP:  136/78  Pulse: 75 72  Resp: 15 17  Temp:  36.6 C  SpO2: 94% 96%    Last Pain:  Vitals:   10/19/20 1733  TempSrc: Oral  PainSc:                  [name]

## 2020-10-19 NOTE — H&P (Signed)
Chief Complaint: Patient was seen in consultation today for percutaneous cryoablation of left interpolar renal mass.  Supervising Physician: Jacqulynn Cadet  Patient Status: [facility] - Out-pt  History of Present Illness: Vincent David is a 55 y.o. male with left renal cell carcinoma metastatic to bone.  He has osseous metesteses to the left proximal femur, right iliac with and right aspect of L5.  His disease was diagnosed in 2021 and he has been on immunotherapy since 09/29/19.  Currently on [place].  His bone lesions have been treated with XRT and he underwent prophylactic ORIF of the left proximal femoral lesion.    His disease burden has been relatively stable.  He presents today for minimally invasive treatment of his primary left renal lesion.  There are reports of improvement in remote metastatic disease following treatment/debulking of the primary tumor.  This is thought to be due to removal of pro renal cell growth factors admitted by the primary tumor.  He was seen in clinic 08/30/20 and deemed an excellent candidate for percutaneous cryoablation of left interpolar renal mass.  Procedure was delayed due to recent +Covid test on 8/29.  Past Medical History:  Diagnosis Date   Cancer [facility])    Cancer of kidney ([place])    Goals of care, counseling/discussion 08/28/2019   History of COVID-19 10/03/2020   Positive COVID swab test   Postictal headache    Seizure ([place])    Sleep apnea    Tinnitus     Past Surgical History:  Procedure Laterality Date   IR IMAGING GUIDED PORT INSERTION  09/28/2019   IR RADIOLOGIST EVAL & MGMT  08/30/2020   KNEE SURGERY Left    ORIF PELVIC FRACTURE Left 09/14/2019   Procedure: OPEN REDUCTION INTERNAL FIXATION (ORIF) LEFT HIP WITH AFFIXUS NAIL;  Surgeon: Frederik Pear, MD;  Location: [facility] ORS;  Service: Orthopedics;  Laterality: Left;   SHOULDER SURGERY Left     Allergies: Patient has no known allergies.  Medications: Prior to Admission  medications   Medication Sig Start Date End Date Taking? Authorizing Provider  aspirin EC 81 MG tablet Take 1 tablet (81 mg total) by mouth 2 (two) times daily. 09/14/19   Leighton Parody, PA-C  famciclovir (FAMVIR) 500 MG tablet Take 1 tablet (500 mg total) by mouth daily. Patient not taking: No sig reported 07/12/20   Volanda Napoleon, MD  ibuprofen (ADVIL) 200 MG tablet Take 400 mg by mouth every 8 (eight) hours as needed (pain.).    [provider]  lamoTRIgine (LAMICTAL) 100 MG tablet TAKE 1 TABLET BY MOUTH TWICE A DAY 09/12/20   [name], NP  oxyCODONE (OXYCONTIN) 15 mg 12 hr tablet Take 1 tablet (15 mg total) by mouth every 12 (twelve) hours. Patient taking differently: Take 15 mg by mouth 2 (two) times daily as needed (pain.). 06/15/20   Volanda Napoleon, MD  Oxycodone HCl 10 MG TABS Take 1 tablet (10 mg total) by mouth every 4 (four) hours as needed. Patient taking differently: Take 10 mg by mouth every 4 (four) hours as needed (pain.). 06/15/20   Volanda Napoleon, MD  temazepam (RESTORIL) 15 MG capsule Take 1 capsule (15 mg total) by mouth at bedtime as needed for sleep. 01/27/20   Volanda Napoleon, MD     Family History  Problem Relation Age of Onset   Sleep apnea Mother    Diabetes Mother    High blood pressure Father    Cancer Maternal Uncle  type unknown "either pancreatic or kidney"    Social History   Socioeconomic History   Marital status: Married    Spouse name: Otila Kluver   Number of children: 3   Years of education: Not on file   Highest education level: Not on file  Occupational History    Employer: OTHER  Tobacco Use   Smoking status: Every Day    Packs/day: 0.50    Types: Cigarettes   Smokeless tobacco: Never   Tobacco comments:    using nicotine patch-still smoking.  Vaping Use   Vaping Use: Never used  Substance and Sexual Activity   Alcohol use: Yes    Alcohol/week: 6.0 standard drinks    Types: 6 Cans of beer per week   Drug use: Never    Sexual activity: Yes    Partners: Female  Other Topics Concern   Not on file  Social History Narrative   Not on file   Social Determinants of Health   Financial Resource Strain: Not on file  Food Insecurity: Not on file  Transportation Needs: Not on file  Physical Activity: Not on file  Stress: Not on file  Social Connections: Not on file     Review of Systems: A 12 point ROS discussed and pertinent positives are indicated in the HPI above.  All other systems are negative.  Review of Systems  Constitutional:  Positive for fatigue.  HENT: Negative.    Respiratory: Negative.    Cardiovascular: Negative.   Gastrointestinal:  Negative for abdominal distention and abdominal pain.  Genitourinary:  Negative for difficulty urinating, dysuria and flank pain.  Skin: Negative.   Neurological: Negative.   Psychiatric/Behavioral:  Negative for behavioral problems and confusion.    Vital Signs: There were no vitals taken for this visit.  Physical Exam Constitutional:      Appearance: Normal appearance.  HENT:     Head: Normocephalic and atraumatic.     Mouth/Throat:     Mouth: Mucous membranes are moist.     Pharynx: Oropharynx is clear. No oropharyngeal exudate.  Cardiovascular:     Rate and Rhythm: Normal rate and regular rhythm.     Pulses: Normal pulses.     Heart sounds: Normal heart sounds.  Pulmonary:     Effort: Pulmonary effort is normal.     Breath sounds: Normal breath sounds.  Abdominal:     General: Abdomen is flat.     Palpations: Abdomen is soft.  Skin:    General: Skin is warm and dry.  Neurological:     Mental Status: He is alert and oriented to person, place, and time.  Psychiatric:        Mood and Affect: Mood normal.        Behavior: Behavior normal.        Thought Content: Thought content normal.        Judgment: Judgment normal.    Imaging: No results found.  Labs:  CBC: Recent Labs    07/12/20 1049 08/17/20 0825 09/20/20 0827  09/23/20 0816  WBC 8.1 7.2 8.8 8.1  HGB 16.1 15.4 17.3* 16.0  HCT 47.1 45.3 51.1 45.9  PLT 221 230 236 211    BMP: Recent Labs    10/20/19 1117 11/10/19 0855 07/12/20 1049 08/17/20 0825 09/20/20 0827 09/23/20 0816  NA 134*   < > 135 138 136 137  K 4.1   < > 4.2 4.0 4.5 4.1  CL 100   < > 102 103 103 101  CO2 26   < > '26 27 23 27  '$ GLUCOSE 142*   < > 98 171* 112* 189*  BUN 12   < > 18 21* 16 17  CALCIUM 9.0   < > 9.6 8.7* 9.4 8.9  CREATININE 0.85   < > 1.01 1.17 0.90 1.13  GFRNONAA >60   < > >60 >60 >60 >60  GFRAA >60  --   --   --   --   --    < > = values in this interval not displayed.    LIVER FUNCTION TESTS: Recent Labs    06/15/20 1010 07/12/20 1049 08/17/20 0825 09/23/20 0816  BILITOT 0.4 0.3 0.5 0.5  AST 13* 14* 17 16  ALT '12 14 15 15  '$ ALKPHOS 109 96 102 98  PROT 6.3* 6.4* 6.2* 6.8  ALBUMIN 4.1 4.2 4.2 4.3      Assessment and Plan:  Metastatic renal cell carcinoma to the bone -ok to proceed with planned cryoablation of primary tumor today.  -plan for overnight observation.  Risks and benefits of image guided renal cryoablation was discussed with the patient including, but not limited to, failure to treat entire lesion, bleeding, infection, damage to adjacent structures, hematuria, urine leak, decrease in renal function or post procedural neuropathy.  All of the patient's questions were answered and the patient is agreeable to proceed. Consent signed and in chart.   Thank you for this interesting consult.  I greatly enjoyed meeting [employer] and look forward to participating in their care.  A copy of this report was sent to the requesting provider on this date.  Electronically Signed: Pasty Spillers, PA 10/19/2020, 8:46 AM   I spent a total of  25 Minutes in face to face in clinical consultation, greater than 50% of which was counseling/coordinating care for image guided left renal cryoablation

## 2020-10-19 NOTE — Procedures (Signed)
Interventional Radiology Procedure Note  Procedure: CT guided cryoablation of LEFT RCC.   Complications: None  Estimated Blood Loss: None  Recommendations: - Admit for obs - Anticipate DC in am   Signed,  Criselda Peaches, MD

## 2020-10-19 NOTE — Anesthesia Procedure Notes (Signed)
Procedure Name: Intubation Date/Time: 10/19/2020 1:26 PM Performed by: Lavina Hamman, CRNA Pre-anesthesia Checklist: Patient identified, Emergency Drugs available, Suction available, Patient being monitored and Timeout performed Patient Re-evaluated:Patient Re-evaluated prior to induction Oxygen Delivery Method: Circle system utilized Preoxygenation: Pre-oxygenation with 100% oxygen Induction Type: IV induction Ventilation: Mask ventilation without difficulty Laryngoscope Size: Mac and 4 Grade View: Grade II Tube type: Oral Tube size: 7.0 mm Number of attempts: 1 Airway Equipment and Method: Stylet Placement Confirmation: ETT inserted through vocal cords under direct vision, positive ETCO2, CO2 detector and breath sounds checked- equal and bilateral Secured at: 23 cm Tube secured with: Tape Dental Injury: Teeth and Oropharynx as per pre-operative assessment

## 2020-10-19 NOTE — H&P (Deleted)
  The note originally documented on this encounter has been moved the the encounter in which it belongs.  

## 2020-10-20 ENCOUNTER — Encounter (HOSPITAL_COMMUNITY): Payer: Self-pay | Admitting: Interventional Radiology

## 2020-10-20 DIAGNOSIS — C649 Malignant neoplasm of unspecified kidney, except renal pelvis: Secondary | ICD-10-CM | POA: Diagnosis not present

## 2020-10-20 DIAGNOSIS — Z9889 Other specified postprocedural states: Secondary | ICD-10-CM | POA: Diagnosis not present

## 2020-10-20 DIAGNOSIS — C642 Malignant neoplasm of left kidney, except renal pelvis: Secondary | ICD-10-CM | POA: Diagnosis not present

## 2020-10-20 DIAGNOSIS — N2889 Other specified disorders of kidney and ureter: Secondary | ICD-10-CM | POA: Diagnosis not present

## 2020-10-20 DIAGNOSIS — F17210 Nicotine dependence, cigarettes, uncomplicated: Secondary | ICD-10-CM | POA: Diagnosis not present

## 2020-10-20 DIAGNOSIS — C7951 Secondary malignant neoplasm of bone: Secondary | ICD-10-CM | POA: Diagnosis not present

## 2020-10-20 NOTE — Discharge Instructions (Signed)
Cryoablation of Renal Tumors, Care After  This sheet gives you information about how to care for yourself after your procedure. Your health care provider may also give you more specific instructions. If you have problems or questions, contact your health care provider.  What can I expect after the procedure? After your procedure, it is common to have pain and discomfort in the upper abdomen. You may be given prescription pain medicines to control this if the pain is severe.   Follow these instructions at home: Puncture site care Follow instructions from your health care provider about how to take care of your incision. Make sure you:  - Wash your hands with soap and water before you change your bandage (dressing). If soap and water are not available, use hand sanitizer.  - Ok to shower 48 hours following procedure. Recommend showering with bandage on, remove bandage immediately after showering and pat area dry. No further dressing changes needed after this- ensure area remains clean and dry until fully healed.  - No submerging (swimming, bathing) for 7 days post-procedure. Check your puncture site area every day for signs of infection. Check for: - Redness, swelling, or pain. - Fluid or blood. - Warmth. - Pus or a bad smell. Activity Rest as often as needing during the first few days of recovery. No stooping, bending, or lifting more than 10 pounds for 1 week.  General instructions To prevent or treat constipation while you are taking prescription pain medicine, your health care provider may recommend that you: - Drink enough fluid to keep your urine clear or pale yellow. - Take over-the-counter or prescription medicines. - Eat foods that are high in fiber, such as fresh fruits and vegetables, whole grains, and beans. - Limit foods that are high in fat and processed sugars, such as fried and sweet foods. - Do not use any products that contain nicotine or tobacco, such as cigarettes and  e-cigarettes. If you need help quitting, ask your health care provider. - Keep all follow-up visits as told by your health care provider. This is important.  3-4 week televisit with the doctor who performed the procedure. Our office will call you to set up the appointment.   Contact a health care provider if: You cannot pass gas. You are unable to have a bowel movement within 3 days. You have a skin rash.  Get help right away if: You have a fever. You have severe or lasting pain in your abdomen, shoulder, or back. You have trouble swallowing or breathing. You have severe weakness or dizziness. You have chest pain or shortness of breath.   This information is not intended to replace advice given to you by your health care provider. Make sure you discuss any questions you have with your health care provider. 

## 2020-10-20 NOTE — Discharge Summary (Signed)
Patient ID: Vincent David MRN: [id number] DOB/AGE: 1966-01-15 55 y.o.  Admit date: 10/19/2020 Discharge date: 10/20/2020  Supervising Physician: Jacqulynn Cadet  Patient Status: [facility] - In-pt  Admission Diagnoses: Left renal cell carcinoma with metastases to bone.  Discharge Diagnoses:  Active Problems:   Renal cell cancer, left [facility])   Discharged Condition: good  Hospital Course: Vincent David presented to IR on 9/14 for scheduled percutaneous cryoablation of left interpolar renal mass.  He underwent procedure without complication and recovered well in PACU.  In overnight observation, there were no concerning events.  He reports minimal to no pain, is voiding on his own, urine is clear, is walking around, as well as eating and drinking.  He expresses readiness to go home.    Consults: None  Significant Diagnostic Studies:  Results for orders placed or performed during the hospital encounter of 10/19/20  CBC with Differential/Platelet  Result Value Ref Range   WBC 7.5 4.0 - 10.5 K/uL   RBC 5.19 4.22 - 5.81 MIL/uL   Hemoglobin 16.8 13.0 - 17.0 g/dL   HCT 49.5 39.0 - 52.0 %   MCV 95.4 80.0 - 100.0 fL   MCH 32.4 26.0 - 34.0 pg   MCHC 33.9 30.0 - 36.0 g/dL   RDW 13.7 11.5 - 15.5 %   Platelets 220 150 - 400 K/uL   nRBC 0.0 0.0 - 0.2 %   Neutrophils Relative % 74 %   Neutro Abs 5.5 1.7 - 7.7 K/uL   Lymphocytes Relative 15 %   Lymphs Abs 1.1 0.7 - 4.0 K/uL   Monocytes Relative 5 %   Monocytes Absolute 0.4 0.1 - 1.0 K/uL   Eosinophils Relative 4 %   Eosinophils Absolute 0.3 0.0 - 0.5 K/uL   Basophils Relative 1 %   Basophils Absolute 0.0 0.0 - 0.1 K/uL   Immature Granulocytes 1 %   Abs Immature Granulocytes 0.09 (H) 0.00 - 0.07 K/uL  Protime-INR  Result Value Ref Range   Prothrombin Time 12.8 11.4 - 15.2 seconds   INR 1.0 0.8 - 1.2  Basic metabolic panel  Result Value Ref Range   Sodium 140 135 - 145 mmol/L   Potassium 4.2 3.5 - 5.1 mmol/L   Chloride 105 98 - 111  mmol/L   CO2 25 22 - 32 mmol/L   Glucose, Bld 110 (H) 70 - 99 mg/dL   BUN 18 6 - 20 mg/dL   Creatinine, Ser 1.05 0.61 - 1.24 mg/dL   Calcium 9.3 8.9 - 10.3 mg/dL   GFR, Estimated >60 >60 mL/min   Anion gap 10 5 - 15  Type and screen  Result Value Ref Range   ABO/RH(D) O POS    Antibody Screen NEG    Sample Expiration      10/22/2020,2359 Performed at [facility], [place] [address], [place], [place] [address]      Treatments: percutaneous cryoablation of left interpolar renal mass on 10/19/20  Discharge Exam: Blood pressure 126/72, pulse 78, temperature 97.8 F (36.6 C), temperature source Oral, resp. rate 14, height '5\' 7"'$  (1.702 m), weight 223 lb 5.2 oz (101.3 kg), SpO2 97 %.  He is sitting up in bed, alert, and conversational, in no apparent distress.  A brief focused exam reveals heart sounds and rate to be normal and lung sounds unremarkable.  Procedure site is bandaged, dry and without bleeding.  No redness or erythema.    Disposition: Discharge disposition: 01-Home or Self Care  Discharge Instructions     Call MD for:  difficulty breathing, headache or visual disturbances   Complete by: As directed    Call MD for:  extreme fatigue   Complete by: As directed    Call MD for:  hives   Complete by: As directed    Call MD for:  persistant dizziness or light-headedness   Complete by: As directed    Call MD for:  persistant nausea and vomiting   Complete by: As directed    Call MD for:  redness, tenderness, or signs of infection (pain, swelling, redness, odor or green/yellow discharge around incision site)   Complete by: As directed    Call MD for:  severe uncontrolled pain   Complete by: As directed    Call MD for:  temperature >100.4   Complete by: As directed    Change dressing (specify)   Complete by: As directed    May change dressing daily and apply bandaid to site for the next 2-3 days   Diet - low sodium heart healthy   Complete by: As  directed    Driving Restrictions   Complete by: As directed    No driving for next 24 hours or after taking narcotics   Increase activity slowly   Complete by: As directed    Lifting restrictions   Complete by: As directed    Avoid heavy lifting for 3-4 days   May shower / Bathe   Complete by: As directed    May walk up steps   Complete by: As directed       Allergies as of 10/20/2020   No Known Allergies      Medication List     TAKE these medications    aspirin EC 81 MG tablet Take 1 tablet (81 mg total) by mouth 2 (two) times daily.   famciclovir 500 MG tablet Commonly known as: FAMVIR Take 1 tablet (500 mg total) by mouth daily.   ibuprofen 200 MG tablet Commonly known as: ADVIL Take 400 mg by mouth every 8 (eight) hours as needed (pain.).   lamoTRIgine 100 MG tablet Commonly known as: LAMICTAL TAKE 1 TABLET BY MOUTH TWICE A DAY   OxyCONTIN 15 mg 12 hr tablet Generic drug: oxyCODONE Take 1 tablet (15 mg total) by mouth every 12 (twelve) hours. What changed:  when to take this reasons to take this   Oxycodone HCl 10 MG Tabs Take 1 tablet (10 mg total) by mouth every 4 (four) hours as needed. What changed: reasons to take this   temazepam 15 MG capsule Commonly known as: RESTORIL Take 1 capsule (15 mg total) by mouth at bedtime as needed for sleep.               Discharge Care Instructions  (From admission, onward)           Start     Ordered   10/20/20 0000  Change dressing (specify)       Comments: May change dressing daily and apply bandaid to site for the next 2-3 days   10/20/20 0934            Follow-up Information     Criselda Peaches, MD Follow up.   Specialties: Interventional Radiology, Radiology Why: Radiology will call you with follow up appointment in 2-3 weeks; call [phone] or [phone] with questions Contact information: [address] [place] [address] [phone]  Volanda Napoleon, MD Follow up.   Specialty: Oncology Why: Follow up with Dr. Marin Olp as scheduled Contact information: [place] [address] [phone]                  Electronically Signed: Pasty Spillers, PA 10/20/2020, 9:37 AM   I have spent Less Than 30 Minutes discharging [employer].

## 2020-11-16 ENCOUNTER — Other Ambulatory Visit: Payer: Self-pay | Admitting: *Deleted

## 2020-11-16 DIAGNOSIS — C642 Malignant neoplasm of left kidney, except renal pelvis: Secondary | ICD-10-CM

## 2020-11-17 ENCOUNTER — Inpatient Hospital Stay (HOSPITAL_BASED_OUTPATIENT_CLINIC_OR_DEPARTMENT_OTHER): Payer: BC Managed Care – PPO | Admitting: Hematology & Oncology

## 2020-11-17 ENCOUNTER — Encounter: Payer: Self-pay | Admitting: Hematology & Oncology

## 2020-11-17 ENCOUNTER — Inpatient Hospital Stay: Payer: BC Managed Care – PPO

## 2020-11-17 ENCOUNTER — Other Ambulatory Visit: Payer: Self-pay

## 2020-11-17 ENCOUNTER — Other Ambulatory Visit (HOSPITAL_BASED_OUTPATIENT_CLINIC_OR_DEPARTMENT_OTHER): Payer: Self-pay

## 2020-11-17 ENCOUNTER — Inpatient Hospital Stay: Payer: BC Managed Care – PPO | Attending: Hematology & Oncology

## 2020-11-17 VITALS — BP 193/85 | HR 95 | Temp 98.7°F | Resp 20 | Wt 212.0 lb

## 2020-11-17 VITALS — BP 170/72 | HR 85 | Resp 17

## 2020-11-17 DIAGNOSIS — C642 Malignant neoplasm of left kidney, except renal pelvis: Secondary | ICD-10-CM

## 2020-11-17 DIAGNOSIS — Z9189 Other specified personal risk factors, not elsewhere classified: Secondary | ICD-10-CM

## 2020-11-17 DIAGNOSIS — I10 Essential (primary) hypertension: Secondary | ICD-10-CM | POA: Diagnosis not present

## 2020-11-17 DIAGNOSIS — R5383 Other fatigue: Secondary | ICD-10-CM | POA: Insufficient documentation

## 2020-11-17 DIAGNOSIS — C7951 Secondary malignant neoplasm of bone: Secondary | ICD-10-CM | POA: Diagnosis not present

## 2020-11-17 DIAGNOSIS — Z7982 Long term (current) use of aspirin: Secondary | ICD-10-CM | POA: Insufficient documentation

## 2020-11-17 DIAGNOSIS — Z5112 Encounter for antineoplastic immunotherapy: Secondary | ICD-10-CM | POA: Diagnosis not present

## 2020-11-17 DIAGNOSIS — Z79899 Other long term (current) drug therapy: Secondary | ICD-10-CM | POA: Insufficient documentation

## 2020-11-17 LAB — CBC WITH DIFFERENTIAL (CANCER CENTER ONLY)
Abs Immature Granulocytes: 0.06 10*3/uL (ref 0.00–0.07)
Basophils Absolute: 0.1 10*3/uL (ref 0.0–0.1)
Basophils Relative: 1 %
Eosinophils Absolute: 0.4 10*3/uL (ref 0.0–0.5)
Eosinophils Relative: 4 %
HCT: 44.5 % (ref 39.0–52.0)
Hemoglobin: 15.2 g/dL (ref 13.0–17.0)
Immature Granulocytes: 1 %
Lymphocytes Relative: 16 %
Lymphs Abs: 1.4 10*3/uL (ref 0.7–4.0)
MCH: 31.7 pg (ref 26.0–34.0)
MCHC: 34.2 g/dL (ref 30.0–36.0)
MCV: 92.9 fL (ref 80.0–100.0)
Monocytes Absolute: 0.5 10*3/uL (ref 0.1–1.0)
Monocytes Relative: 6 %
Neutro Abs: 6.5 10*3/uL (ref 1.7–7.7)
Neutrophils Relative %: 72 %
Platelet Count: 262 10*3/uL (ref 150–400)
RBC: 4.79 MIL/uL (ref 4.22–5.81)
RDW: 12.7 % (ref 11.5–15.5)
WBC Count: 9 10*3/uL (ref 4.0–10.5)
nRBC: 0 % (ref 0.0–0.2)

## 2020-11-17 LAB — COMPREHENSIVE METABOLIC PANEL
ALT: 16 U/L (ref 0–44)
AST: 15 U/L (ref 15–41)
Albumin: 4.3 g/dL (ref 3.5–5.0)
Alkaline Phosphatase: 119 U/L (ref 38–126)
Anion gap: 7 (ref 5–15)
BUN: 17 mg/dL (ref 6–20)
CO2: 26 mmol/L (ref 22–32)
Calcium: 9.6 mg/dL (ref 8.9–10.3)
Chloride: 103 mmol/L (ref 98–111)
Creatinine, Ser: 1.01 mg/dL (ref 0.61–1.24)
GFR, Estimated: >60 mL/min (ref >60)
Glucose, Bld: 117 mg/dL — ABNORMAL HIGH (ref 70–99)
Potassium: 4.2 mmol/L (ref 3.5–5.1)
Sodium: 136 mmol/L (ref 135–145)
Total Bilirubin: 0.4 mg/dL (ref 0.3–1.2)
Total Protein: 6.6 g/dL (ref 6.5–8.1)

## 2020-11-17 LAB — LACTATE DEHYDROGENASE: LDH: 204 U/L — ABNORMAL HIGH (ref 98–192)

## 2020-11-17 MED ORDER — DENOSUMAB 120 MG/1.7ML ~~LOC~~ SOLN
120.0000 mg | Freq: Once | SUBCUTANEOUS | Status: AC
  Administered 2020-11-17: 120 mg via SUBCUTANEOUS
  Filled 2020-11-17: qty 1.7

## 2020-11-17 MED ORDER — HEPARIN SOD (PORK) LOCK FLUSH 100 UNIT/ML IV SOLN
500.0000 [IU] | Freq: Once | INTRAVENOUS | Status: AC | PRN
Start: 2020-11-17 — End: 2020-11-17
  Administered 2020-11-17: 500 [IU]

## 2020-11-17 MED ORDER — OXYCODONE HCL 10 MG PO TABS
10.0000 mg | ORAL_TABLET | ORAL | 0 refills | Status: DC | PRN
  Filled 2020-11-17: qty 90, 15d supply, fill #0

## 2020-11-17 MED ORDER — SODIUM CHLORIDE 0.9 % IV SOLN
480.0000 mg | Freq: Once | INTRAVENOUS | Status: AC
  Administered 2020-11-17: 480 mg via INTRAVENOUS
  Filled 2020-11-17: qty 48

## 2020-11-17 MED ORDER — SODIUM CHLORIDE 0.9% FLUSH
10.0000 mL | INTRAVENOUS | Status: DC | PRN
Start: 2020-11-17 — End: 2020-11-17
  Administered 2020-11-17: 10 mL

## 2020-11-17 MED ORDER — SODIUM CHLORIDE 0.9 % IV SOLN: Freq: Once | INTRAVENOUS | Status: AC

## 2020-11-17 MED ORDER — AMLODIPINE BESYLATE 5 MG PO TABS
5.0000 mg | ORAL_TABLET | Freq: Every day | ORAL | 3 refills | Status: DC
  Filled 2020-11-17: qty 30, 30d supply, fill #0

## 2020-11-17 NOTE — Addendum Note (Signed)
Addended by: Burney Gauze R on: 11/17/2020 04:55 PM   Modules accepted: Orders

## 2020-11-17 NOTE — Progress Notes (Addendum)
Hematology and Oncology Follow Up Visit  Vincent David [id number] [birth year] 55 y.o. 11/17/2020   Principle Diagnosis:  Metastatic Renal Cell Carcinoma - bone mets  Current Therapy:   Nivoumab/Ipilimumab -- s/p cycle #3 -- started on 09/29/2019 -- maintenance Nivolumab q month -- Started on 12/29/2019 Xgeva 120 mg sq q 3 months -- next dose in 02/2021 XRT to right iliac mass/ L5 vertebral body/ Left femoral neck LEFT hip repair Cryoablation of left kidney mass-10/19/2020     Interim History:  Vincent David is back for follow-up.  He looks quite good.  He did have a cryoablation of the left kidney primary.  This was done on 10/19/2020.  Hopefully this will be a successful procedure.  He feels okay.  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  He enjoys this.  He has had no problems with cough or shortness of breath.  He has had no nausea or vomiting.  There is been no bleeding.  He has had no problems with bowels or bladder.  His left hip seems to be doing a little bit better.  He has had no issues with leg swelling.  He has had no problems with headache.  Has had no visual changes.  Overall, I would say his performance status is probably ECOG 1.    Medications:  Current Outpatient Medications:    aspirin EC 81 MG tablet, Take 1 tablet (81 mg total) by mouth 2 (two) times daily., Disp: 60 tablet, Rfl: 0   ibuprofen (ADVIL) 200 MG tablet, Take 400 mg by mouth every 8 (eight) hours as needed (pain.)., Disp: , Rfl:    lamoTRIgine (LAMICTAL) 100 MG tablet, TAKE 1 TABLET BY MOUTH TWICE A DAY, Disp: 180 tablet, Rfl: 3   oxyCODONE (OXYCONTIN) 15 mg 12 hr tablet, Take 1 tablet (15 mg total) by mouth every 12 (twelve) hours. (Patient taking differently: Take 15 mg by mouth 2 (two) times daily as needed (pain.).), Disp: 60 tablet, Rfl: 0   Oxycodone HCl 10 MG TABS, Take 1 tablet (10 mg total) by mouth every 4 (four) hours as needed. (Patient taking differently: Take 10 mg by mouth every 4  (four) hours as needed (pain.).), Disp: 90 tablet, Rfl: 0   famciclovir (FAMVIR) 500 MG tablet, Take 1 tablet (500 mg total) by mouth daily. (Patient not taking: No sig reported), Disp: 30 tablet, Rfl: 6 No current facility-administered medications for this visit.  Facility-Administered Medications Ordered in Other Visits:    sodium chloride flush (NS) 0.9 % injection 10 mL, 10 mL, Intracatheter, PRN, Volanda Napoleon, MD, 10 mL at 05/19/20 1343  Allergies: No Known Allergies  Past Medical History, Surgical history, Social history, and Family History were reviewed and updated.  Review of Systems: Review of Systems  Constitutional: Negative.   HENT:  Negative.    Eyes: Negative.   Respiratory: Negative.    Cardiovascular: Negative.   Gastrointestinal: Negative.   Endocrine: Negative.   Musculoskeletal:  Positive for back pain and flank pain.  Skin: Negative.   Neurological: Negative.   Hematological: Negative.   Psychiatric/Behavioral: Negative.     Physical Exam:  weight is 212 lb (96.2 kg). His oral temperature is 98.7 F (37.1 C). His blood pressure is 193/85 (abnormal) and his pulse is 95. His respiration is 20 and oxygen saturation is 94%.   Wt Readings from Last 3 Encounters:  11/17/20 212 lb (96.2 kg)  10/19/20 223 lb 5.2 oz (101.3 kg)  09/20/20 208 lb  12.8 oz (94.7 kg)    Physical Exam Vitals reviewed.  HENT:     Head: Normocephalic and atraumatic.  Eyes:     Pupils: Pupils are equal, round, and reactive to light.  Cardiovascular:     Rate and Rhythm: Normal rate and regular rhythm.     Heart sounds: Normal heart sounds.  Pulmonary:     Effort: Pulmonary effort is normal.     Breath sounds: Normal breath sounds.  Abdominal:     General: Bowel sounds are normal.     Palpations: Abdomen is soft.  Musculoskeletal:        General: No tenderness or deformity. Normal range of motion.     Cervical back: Normal range of motion.  Lymphadenopathy:     Cervical: No  cervical adenopathy.  Skin:    General: Skin is warm and dry.     Findings: No erythema or rash.  Neurological:     Mental Status: He is alert and oriented to person, place, and time.  Psychiatric:        Behavior: Behavior normal.        Thought Content: Thought content normal.        Judgment: Judgment normal.     Lab Results  Component Value Date   WBC 9.0 11/17/2020   HGB 15.2 11/17/2020   HCT 44.5 11/17/2020   MCV 92.9 11/17/2020   PLT 262 11/17/2020     Chemistry      Component Value Date/Time   NA 136 11/17/2020 1145   K 4.2 11/17/2020 1145   CL 103 11/17/2020 1145   CO2 26 11/17/2020 1145   BUN 17 11/17/2020 1145   CREATININE 1.01 11/17/2020 1145   CREATININE 1.13 09/23/2020 0816   CREATININE 1.15 08/25/2019 1450      Component Value Date/Time   CALCIUM 9.6 11/17/2020 1145   ALKPHOS 119 11/17/2020 1145   AST 15 11/17/2020 1145   AST 16 09/23/2020 0816   ALT 16 11/17/2020 1145   ALT 15 09/23/2020 0816   BILITOT 0.4 11/17/2020 1145   BILITOT 0.5 09/23/2020 0816      Impression and Plan: Vincent David is a very nice 55 year old white male.  He has metastatic renal cell carcinoma.  For some reason, his disease clearly favors his bones.  His lungs and liver and lymph nodes all appear to be clean.  I am glad that he could have the cryoablation.  This hopefully will be a very helpful intervention for the kidney primary in the left kidney.  I will make sure that he gets his Xgeva today.  I think will will probably have to get go about and get him scanned at some point.  His last scans were done back in June.  I think we probably would wait till after his next cycle.  With his next cycle in November, right before Thanksgiving.  I am just glad that his quality of life is doing so well.  He is active.  He still has a lot of mental fatigue but he seems to be managing this.  I am surprised that his blood pressure is still high.  I think it has been trending upward.  I  think we are going to have to put him on a antihypertensive.  I will see about putting him on amlodipine.  I like amlodipine.  We will try him on 5 mg p.o. daily.  I will plan to get him back right before Thanksgiving.  10/13/202212:23 PM

## 2020-11-17 NOTE — Patient Instructions (Signed)

## 2020-11-17 NOTE — Patient Instructions (Addendum)
[facility]  Discharge Instructions: Thank you for choosing [facility] to provide your oncology and hematology care.   If you have a lab appointment with the [facility], please go directly to the [facility] and check in at the registration area.  Wear comfortable clothing and clothing appropriate for easy access to any Portacath or PICC line.   We strive to give you quality time with your provider. You may need to reschedule your appointment if you arrive late (15 or more minutes).  Arriving late affects you and other patients whose appointments are after yours.  Also, if you miss three or more appointments without notifying the office, you may be dismissed from the clinic at the provider's discretion.      For prescription refill requests, have your pharmacy contact our office and allow 72 hours for refills to be completed.    Today you received the following chemotherapy and/or immunotherapy agents Opdivo      To help prevent nausea and vomiting after your treatment, we encourage you to take your nausea medication as directed.  BELOW ARE SYMPTOMS THAT SHOULD BE REPORTED IMMEDIATELY: *FEVER GREATER THAN 100.4 F (38 C) OR HIGHER *CHILLS OR SWEATING *NAUSEA AND VOMITING THAT IS NOT CONTROLLED WITH YOUR NAUSEA MEDICATION *UNUSUAL SHORTNESS OF BREATH *UNUSUAL BRUISING OR BLEEDING *URINARY PROBLEMS (pain or burning when urinating, or frequent urination) *BOWEL PROBLEMS (unusual diarrhea, constipation, pain near the anus) TENDERNESS IN MOUTH AND THROAT WITH OR WITHOUT PRESENCE OF ULCERS (sore throat, sores in mouth, or a toothache) UNUSUAL RASH, SWELLING OR PAIN  UNUSUAL VAGINAL DISCHARGE OR ITCHING   Items with * indicate a potential emergency and should be followed up as soon as possible or go to the Emergency Department if any problems should occur.  Please show the CHEMOTHERAPY ALERT CARD or IMMUNOTHERAPY ALERT CARD at check-in to the  Emergency Department and triage nurse. Should you have questions after your visit or need to cancel or reschedule your appointment, please contact [facility]  [phone] and follow the prompts.  Office hours are 8:00 a.m. to 4:30 p.m. Monday - Friday. Please note that voicemails left after 4:00 p.m. may not be returned until the following business day.  We are closed weekends and major holidays. You have access to a nurse at all times for urgent questions. Please call the main number to the clinic [phone] and follow the prompts.  For any non-urgent questions, you may also contact your provider using MyChart. We now offer e-Visits for anyone 71 and older to request care online for non-urgent symptoms. For details visit [email or web address].   Also download the MyChart app! Go to the app store, search "MyChart", open the app, select [facility], and log in with your MyChart username and password.  Due to Covid, a mask is required upon entering the hospital/clinic. If you do not have a mask, one will be given to you upon arrival. For doctor visits, patients may have 1 support person aged 58 or older with them. For treatment visits, patients cannot have anyone with them due to current Covid guidelines and our immunocompromised population. Denosumab injection What is this medication? DENOSUMAB (den oh sue mab) slows bone breakdown. Prolia is used to treat osteoporosis in women after menopause and in men, and in people who are taking corticosteroids for 6 months or more. Delton See is used to treat a high calcium level due to cancer and to prevent bone  fractures and other bone problems caused by multiple myeloma or cancer bone metastases. Delton See is also used to treat giant cell tumor of the bone. This medicine may be used for other purposes; ask your health care provider or pharmacist if you have questions. COMMON BRAND NAME(S): Prolia, XGEVA What should I tell my care team  before I take this medication? They need to know if you have any of these conditions: dental disease having surgery or tooth extraction infection kidney disease low levels of calcium or Vitamin D in the blood malnutrition on hemodialysis skin conditions or sensitivity thyroid or parathyroid disease an unusual reaction to denosumab, other medicines, foods, dyes, or preservatives pregnant or trying to get pregnant breast-feeding How should I use this medication? This medicine is for injection under the skin. It is given by a health care professional in a hospital or clinic setting. A special MedGuide will be given to you before each treatment. Be sure to read this information carefully each time. For Prolia, talk to your pediatrician regarding the use of this medicine in children. Special care may be needed. For Delton See, talk to your pediatrician regarding the use of this medicine in children. While this drug may be prescribed for children as young as 13 years for selected conditions, precautions do apply. Overdosage: If you think you have taken too much of this medicine contact a poison control center or emergency room at once. NOTE: This medicine is only for you. Do not share this medicine with others. What if I miss a dose? It is important not to miss your dose. Call your doctor or health care professional if you are unable to keep an appointment. What may interact with this medication? Do not take this medicine with any of the following medications: other medicines containing denosumab This medicine may also interact with the following medications: medicines that lower your chance of fighting infection steroid medicines like prednisone or cortisone This list may not describe all possible interactions. Give your health care provider a list of all the medicines, herbs, non-prescription drugs, or dietary supplements you use. Also tell them if you smoke, drink alcohol, or use illegal drugs.  Some items may interact with your medicine. What should I watch for while using this medication? Visit your doctor or health care professional for regular checks on your progress. Your doctor or health care professional may order blood tests and other tests to see how you are doing. Call your doctor or health care professional for advice if you get a fever, chills or sore throat, or other symptoms of a cold or flu. Do not treat yourself. This drug may decrease your body's ability to fight infection. Try to avoid being around people who are sick. You should make sure you get enough calcium and vitamin D while you are taking this medicine, unless your doctor tells you not to. Discuss the foods you eat and the vitamins you take with your health care professional. See your dentist regularly. Brush and floss your teeth as directed. Before you have any dental work done, tell your dentist you are receiving this medicine. Do not become pregnant while taking this medicine or for 5 months after stopping it. Talk with your doctor or health care professional about your birth control options while taking this medicine. Women should inform their doctor if they wish to become pregnant or think they might be pregnant. There is a potential for serious side effects to an unborn child. Talk to your health care professional  or pharmacist for more information. What side effects may I notice from receiving this medication? Side effects that you should report to your doctor or health care professional as soon as possible: allergic reactions like skin rash, itching or hives, swelling of the face, lips, or tongue bone pain breathing problems dizziness jaw pain, especially after dental work redness, blistering, peeling of the skin signs and symptoms of infection like fever or chills; cough; sore throat; pain or trouble passing urine signs of low calcium like fast heartbeat, muscle cramps or muscle pain; pain, tingling,  numbness in the hands or feet; seizures unusual bleeding or bruising unusually weak or tired Side effects that usually do not require medical attention (report to your doctor or health care professional if they continue or are bothersome): constipation diarrhea headache joint pain loss of appetite muscle pain runny nose tiredness upset stomach This list may not describe all possible side effects. Call your doctor for medical advice about side effects. You may report side effects to FDA at 1-800-FDA-1088. Where should I keep my medication? This medicine is only given in a clinic, doctor's office, or other health care setting and will not be stored at home. NOTE: This sheet is a summary. It may not cover all possible information. If you have questions about this medicine, talk to your doctor, pharmacist, or health care provider.  2022 Elsevier/Gold Standard (2017-05-31 16:10:44)

## 2020-11-17 NOTE — Progress Notes (Signed)
Per Dr. Ennever ok to treat with current BP. 

## 2020-11-18 LAB — THYROID PANEL WITH TSH
Free Thyroxine Index: 1.6 (ref 1.2–4.9)
T3 Uptake Ratio: 27 % (ref 24–39)
T4, Total: 6 ug/dL (ref 4.5–12.0)
TSH: 3.29 u[IU]/mL (ref 0.450–4.500)

## 2020-11-24 ENCOUNTER — Other Ambulatory Visit (HOSPITAL_BASED_OUTPATIENT_CLINIC_OR_DEPARTMENT_OTHER): Payer: Self-pay

## 2020-11-24 ENCOUNTER — Encounter: Payer: Self-pay | Admitting: Hematology & Oncology

## 2020-11-24 ENCOUNTER — Telehealth: Payer: Self-pay

## 2020-11-24 NOTE — Telephone Encounter (Signed)
Notified pt that the PA representative of patient concern.

## 2020-11-25 ENCOUNTER — Other Ambulatory Visit (HOSPITAL_BASED_OUTPATIENT_CLINIC_OR_DEPARTMENT_OTHER): Payer: Self-pay

## 2020-12-01 ENCOUNTER — Encounter: Payer: Self-pay | Admitting: Hematology & Oncology

## 2020-12-01 ENCOUNTER — Ambulatory Visit
Admission: RE | Admit: 2020-12-01 | Discharge: 2020-12-01 | Disposition: A | Discharge status: Home / Self Care | Payer: BC Managed Care – PPO | Source: Ambulatory Visit | Attending: Physician Assistant | Admitting: Physician Assistant

## 2020-12-01 DIAGNOSIS — C7951 Secondary malignant neoplasm of bone: Secondary | ICD-10-CM | POA: Diagnosis not present

## 2020-12-01 DIAGNOSIS — C642 Malignant neoplasm of left kidney, except renal pelvis: Secondary | ICD-10-CM | POA: Diagnosis not present

## 2020-12-01 HISTORY — PX: IR RADIOLOGIST EVAL & MGMT: IMG5224

## 2020-12-01 NOTE — Progress Notes (Signed)
Chief Complaint: Patient was seen in consultation today for left RCC at the request of [facility]  Referring Physician(s): Burney Gauze, MD  History of Present Illness: Vincent David is a 55 y.o. male with left renal cell carcinoma metastatic to bone.  He has osseous metastases to the left proximal femur, right iliac wing and right aspect of L5.  His disease was diagnosed in 2021 and he has been on immunotherapy since 09/29/19.  Currently on [place].  His bone lesions have been treated with XRT and he underwent prophylactic ORIF of the left proximal femoral lesion.    His disease burden has been relatively stable.  He presents today at the kind request of Dr. Marin Olp to discuss minimally invasive treatment of his primary left renal lesion.  There are reports of improvement in remote metastatic disease following treatment/debulking of the primary tumor.  This is thought to be due to removal of pro renal cell growth factors admitted by the primary tumor.   He underwent successful left renal cryoablation on 10/19/20.  The procedure was uneventful.  Similarly, his recovery was quite easy.  No hematuria, flank pain or abdominal pain.  He has back to full activity and working 2 days/week.  He has no complaints.  Past Medical History:  Diagnosis Date   Cancer [facility])    Cancer of kidney ([place])    Goals of care, counseling/discussion 08/28/2019   History of COVID-19 10/03/2020   Positive COVID swab test   Postictal headache    Seizure ([place])    Sleep apnea    Tinnitus     Past Surgical History:  Procedure Laterality Date   IR IMAGING GUIDED PORT INSERTION  09/28/2019   IR RADIOLOGIST EVAL & MGMT  08/30/2020   KNEE SURGERY Left    ORIF PELVIC FRACTURE Left 09/14/2019   Procedure: OPEN REDUCTION INTERNAL FIXATION (ORIF) LEFT HIP WITH AFFIXUS NAIL;  Surgeon: Frederik Pear, MD;  Location: [facility];  Service: Orthopedics;  Laterality: Left;   RADIOLOGY WITH ANESTHESIA N/A 10/19/2020    Procedure: CT CYROABLATION WITH ANESTHESIA;  Surgeon: Criselda Peaches, MD;  Location: [facility];  Service: Radiology;  Laterality: N/A;   SHOULDER SURGERY Left     Allergies: Patient has no known allergies.  Medications: Prior to Admission medications   Medication Sig Start Date End Date Taking? Authorizing Provider  amLODipine (NORVASC) 5 MG tablet Take 1 tablet (5 mg total) by mouth daily. 11/17/20   Volanda Napoleon, MD  aspirin EC 81 MG tablet Take 1 tablet (81 mg total) by mouth 2 (two) times daily. 09/14/19   Leighton Parody, PA-C  famciclovir (FAMVIR) 500 MG tablet Take 1 tablet (500 mg total) by mouth daily. Patient not taking: No sig reported 07/12/20   Volanda Napoleon, MD  ibuprofen (ADVIL) 200 MG tablet Take 400 mg by mouth every 8 (eight) hours as needed (pain.).    [provider]  lamoTRIgine (LAMICTAL) 100 MG tablet TAKE 1 TABLET BY MOUTH TWICE A DAY 09/12/20   [name], NP  oxyCODONE (OXYCONTIN) 15 mg 12 hr tablet Take 1 tablet (15 mg total) by mouth every 12 (twelve) hours. Patient taking differently: Take 15 mg by mouth 2 (two) times daily as needed (pain.). 06/15/20   Volanda Napoleon, MD  Oxycodone HCl 10 MG TABS Take 1 tablet (10 mg total) by mouth every 4 (four) hours as needed. 11/17/20   Volanda Napoleon, MD     Family History  Problem Relation Age of Onset   Sleep apnea Mother    Diabetes Mother    High blood pressure Father    Cancer Maternal Uncle        type unknown "either pancreatic or kidney"    Social History   Socioeconomic History   Marital status: Married    Spouse name: Otila Kluver   Number of children: 3   Years of education: Not on file   Highest education level: Not on file  Occupational History    Employer: OTHER  Tobacco Use   Smoking status: Every Day    Packs/day: 0.25    Types: Cigarettes   Smokeless tobacco: Never   Tobacco comments:    using nicotine patch-still smoking.  Vaping Use   Vaping Use: Never used   Substance and Sexual Activity   Alcohol use: Yes    Alcohol/week: 6.0 standard drinks    Types: 6 Cans of beer per week   Drug use: Never   Sexual activity: Yes    Partners: Female  Other Topics Concern   Not on file  Social History Narrative   Not on file   Social Determinants of Health   Financial Resource Strain: Not on file  Food Insecurity: Not on file  Transportation Needs: Not on file  Physical Activity: Not on file  Stress: Not on file  Social Connections: Not on file    ECOG Status: 1 - Symptomatic but completely ambulatory  Review of Systems: A 12 point ROS discussed and pertinent positives are indicated in the HPI above.  All other systems are negative.  Review of Systems  Vital Signs: BP (!) 175/82 (BP Location: Left Arm)   Pulse 89   SpO2 97%   Physical Exam Constitutional:      General: He is not in acute distress.    Appearance: Normal appearance.  HENT:     Head: Normocephalic and atraumatic.  Eyes:     General: No scleral icterus. Cardiovascular:     Rate and Rhythm: Normal rate and regular rhythm.  Pulmonary:     Effort: Pulmonary effort is normal.  Abdominal:     General: Abdomen is flat.     Palpations: Abdomen is soft.  Skin:    General: Skin is warm and dry.  Neurological:     Mental Status: He is alert and oriented to person, place, and time.  Psychiatric:        Mood and Affect: Mood normal.        Behavior: Behavior normal.      Imaging: No results found.  Labs:  CBC: Recent Labs    09/20/20 0827 09/23/20 0816 10/19/20 1014 11/17/20 1145  WBC 8.8 8.1 7.5 9.0  HGB 17.3* 16.0 16.8 15.2  HCT 51.1 45.9 49.5 44.5  PLT 236 211 220 262    COAGS: Recent Labs    10/19/20 1014  INR 1.0    BMP: Recent Labs    09/20/20 0827 09/23/20 0816 10/19/20 1014 11/17/20 1145  NA 136 137 140 136  K 4.5 4.1 4.2 4.2  CL 103 101 105 103  CO2 23 27 25 26   GLUCOSE 112* 189* 110* 117*  BUN 16 17 18 17   CALCIUM 9.4 8.9 9.3  9.6  CREATININE 0.90 1.13 1.05 1.01  GFRNONAA >60 >60 >60 >60    LIVER FUNCTION TESTS: Recent Labs    07/12/20 1049 08/17/20 0825 09/23/20 0816 11/17/20 1145  BILITOT 0.3 0.5 0.5 0.4  AST 14* 17 16  15  ALT 14 15 15 16   ALKPHOS 96 102 98 119  PROT 6.4* 6.2* 6.8 6.6  ALBUMIN 4.2 4.2 4.3 4.3    TUMOR MARKERS: No results for input(s): AFPTM, CEA, CA199, CHROMGRNA in the last 8760 hours.  Assessment and Plan:  55 year old male with left renal cell carcinoma with osseous metastatic disease currently well controlled on immunotherapy.  He recently underwent percutaneous cryoablation of the dominant left renal mass.  His postoperative course has been uncomplicated.  He is doing very well.  1.)  Follow-up MRI abdomen with gadolinium contrast and accompanying clinic visit in 6 months.  2.)  We will also reassess osseous metastatic disease at that time to determine if any percutaneous intervention may be feasible.   Electronically Signed: Criselda Peaches 12/01/2020, 4:36 PM   I spent a total of 15 Minutes in face to face in clinical consultation, greater than 50% of which was counseling/coordinating care for Left RCC

## 2020-12-02 ENCOUNTER — Encounter: Payer: Self-pay | Admitting: *Deleted

## 2020-12-06 ENCOUNTER — Telehealth: Payer: Self-pay | Admitting: *Deleted

## 2020-12-06 NOTE — Telephone Encounter (Signed)
Called and lvm of upcoming appointments - requested call back to confirm. - mailed calendar.

## 2020-12-22 ENCOUNTER — Inpatient Hospital Stay (HOSPITAL_BASED_OUTPATIENT_CLINIC_OR_DEPARTMENT_OTHER): Payer: BC Managed Care – PPO | Admitting: Hematology & Oncology

## 2020-12-22 ENCOUNTER — Inpatient Hospital Stay: Payer: BC Managed Care – PPO

## 2020-12-22 ENCOUNTER — Inpatient Hospital Stay: Payer: BC Managed Care – PPO | Attending: Hematology & Oncology

## 2020-12-22 ENCOUNTER — Encounter: Payer: Self-pay | Admitting: Hematology & Oncology

## 2020-12-22 ENCOUNTER — Other Ambulatory Visit: Payer: Self-pay

## 2020-12-22 VITALS — BP 127/69 | HR 86 | Temp 97.7°F | Resp 18 | Ht 67.0 in | Wt 207.0 lb

## 2020-12-22 DIAGNOSIS — C7951 Secondary malignant neoplasm of bone: Secondary | ICD-10-CM | POA: Diagnosis not present

## 2020-12-22 DIAGNOSIS — Z7982 Long term (current) use of aspirin: Secondary | ICD-10-CM | POA: Insufficient documentation

## 2020-12-22 DIAGNOSIS — C642 Malignant neoplasm of left kidney, except renal pelvis: Secondary | ICD-10-CM

## 2020-12-22 DIAGNOSIS — Z5112 Encounter for antineoplastic immunotherapy: Secondary | ICD-10-CM | POA: Insufficient documentation

## 2020-12-22 DIAGNOSIS — Z79899 Other long term (current) drug therapy: Secondary | ICD-10-CM | POA: Insufficient documentation

## 2020-12-22 LAB — CBC WITH DIFFERENTIAL (CANCER CENTER ONLY)
Abs Immature Granulocytes: 0.04 10*3/uL (ref 0.00–0.07)
Basophils Absolute: 0 10*3/uL (ref 0.0–0.1)
Basophils Relative: 1 %
Eosinophils Absolute: 0.2 10*3/uL (ref 0.0–0.5)
Eosinophils Relative: 3 %
HCT: 43.4 % (ref 39.0–52.0)
Hemoglobin: 14.8 g/dL (ref 13.0–17.0)
Immature Granulocytes: 1 %
Lymphocytes Relative: 17 %
Lymphs Abs: 1.2 10*3/uL (ref 0.7–4.0)
MCH: 31.9 pg (ref 26.0–34.0)
MCHC: 34.1 g/dL (ref 30.0–36.0)
MCV: 93.5 fL (ref 80.0–100.0)
Monocytes Absolute: 0.4 10*3/uL (ref 0.1–1.0)
Monocytes Relative: 6 %
Neutro Abs: 5.5 10*3/uL (ref 1.7–7.7)
Neutrophils Relative %: 72 %
Platelet Count: 237 10*3/uL (ref 150–400)
RBC: 4.64 MIL/uL (ref 4.22–5.81)
RDW: 13 % (ref 11.5–15.5)
WBC Count: 7.5 10*3/uL (ref 4.0–10.5)
nRBC: 0 % (ref 0.0–0.2)

## 2020-12-22 LAB — CMP (CANCER CENTER ONLY)
ALT: 15 U/L (ref 0–44)
AST: 15 U/L (ref 15–41)
Albumin: 4.4 g/dL (ref 3.5–5.0)
Alkaline Phosphatase: 98 U/L (ref 38–126)
Anion gap: 7 (ref 5–15)
BUN: 13 mg/dL (ref 6–20)
CO2: 27 mmol/L (ref 22–32)
Calcium: 9.2 mg/dL (ref 8.9–10.3)
Chloride: 104 mmol/L (ref 98–111)
Creatinine: 0.98 mg/dL (ref 0.61–1.24)
GFR, Estimated: >60 mL/min (ref >60)
Glucose, Bld: 95 mg/dL (ref 70–99)
Potassium: 4.1 mmol/L (ref 3.5–5.1)
Sodium: 138 mmol/L (ref 135–145)
Total Bilirubin: 0.4 mg/dL (ref 0.3–1.2)
Total Protein: 6.9 g/dL (ref 6.5–8.1)

## 2020-12-22 LAB — LACTATE DEHYDROGENASE: LDH: 205 U/L — ABNORMAL HIGH (ref 98–192)

## 2020-12-22 MED ORDER — SODIUM CHLORIDE 0.9 % IV SOLN
480.0000 mg | Freq: Once | INTRAVENOUS | Status: AC
  Administered 2020-12-22: 480 mg via INTRAVENOUS
  Filled 2020-12-22: qty 48

## 2020-12-22 MED ORDER — SODIUM CHLORIDE 0.9% FLUSH
10.0000 mL | INTRAVENOUS | Status: DC | PRN
  Administered 2020-12-22: 15:00:00 10 mL

## 2020-12-22 MED ORDER — SODIUM CHLORIDE 0.9 % IV SOLN: Freq: Once | INTRAVENOUS | Status: AC

## 2020-12-22 MED ORDER — HEPARIN SOD (PORK) LOCK FLUSH 100 UNIT/ML IV SOLN
500.0000 [IU] | Freq: Once | INTRAVENOUS | Status: AC | PRN
  Administered 2020-12-22: 15:00:00 500 [IU]

## 2020-12-22 NOTE — Patient Instructions (Signed)

## 2020-12-22 NOTE — Patient Instructions (Signed)
[facility] [place]  Discharge Instructions: Thank you for choosing [facility] to provide your oncology and hematology care.   If you have a lab appointment with the [facility], please go directly to the [facility] and check in at the registration area.  Wear comfortable clothing and clothing appropriate for easy access to any Portacath or PICC line.   We strive to give you quality time with your provider. You may need to reschedule your appointment if you arrive late (15 or more minutes).  Arriving late affects you and other patients whose appointments are after yours.  Also, if you miss three or more appointments without notifying the office, you may be dismissed from the clinic at the provider's discretion.      For prescription refill requests, have your pharmacy contact our office and allow 72 hours for refills to be completed.    Today you received the following chemotherapy and/or immunotherapy agents Opdivo      To help prevent nausea and vomiting after your treatment, we encourage you to take your nausea medication as directed.  BELOW ARE SYMPTOMS THAT SHOULD BE REPORTED IMMEDIATELY: *FEVER GREATER THAN 100.4 F (38 C) OR HIGHER *CHILLS OR SWEATING *NAUSEA AND VOMITING THAT IS NOT CONTROLLED WITH YOUR NAUSEA MEDICATION *UNUSUAL SHORTNESS OF BREATH *UNUSUAL BRUISING OR BLEEDING *URINARY PROBLEMS (pain or burning when urinating, or frequent urination) *BOWEL PROBLEMS (unusual diarrhea, constipation, pain near the anus) TENDERNESS IN MOUTH AND THROAT WITH OR WITHOUT PRESENCE OF ULCERS (sore throat, sores in mouth, or a toothache) UNUSUAL RASH, SWELLING OR PAIN  UNUSUAL VAGINAL DISCHARGE OR ITCHING   Items with * indicate a potential emergency and should be followed up as soon as possible or go to the Emergency Department if any problems should occur.  Please show the CHEMOTHERAPY ALERT CARD or IMMUNOTHERAPY ALERT CARD at check-in to the  Emergency Department and triage nurse. Should you have questions after your visit or need to cancel or reschedule your appointment, please contact [facility]  [phone] and follow the prompts.  Office hours are 8:00 a.m. to 4:30 p.m. Monday - Friday. Please note that voicemails left after 4:00 p.m. may not be returned until the following business day.  We are closed weekends and major holidays. You have access to a nurse at all times for urgent questions. Please call the main number to the clinic [phone] and follow the prompts.  For any non-urgent questions, you may also contact your provider using MyChart. We now offer e-Visits for anyone 71 and older to request care online for non-urgent symptoms. For details visit [email or web address].   Also download the MyChart app! Go to the app store, search "MyChart", open the app, select [facility], and log in with your MyChart username and password.  Due to Covid, a mask is required upon entering the hospital/clinic. If you do not have a mask, one will be given to you upon arrival. For doctor visits, patients may have 1 support person aged 58 or older with them. For treatment visits, patients cannot have anyone with them due to current Covid guidelines and our immunocompromised population. Denosumab injection What is this medication? DENOSUMAB (den oh sue mab) slows bone breakdown. Prolia is used to treat osteoporosis in women after menopause and in men, and in people who are taking corticosteroids for 6 months or more. Delton See is used to treat a high calcium level due to cancer and to prevent bone  fractures and other bone problems caused by multiple myeloma or cancer bone metastases. Delton See is also used to treat giant cell tumor of the bone. This medicine may be used for other purposes; ask your health care provider or pharmacist if you have questions. COMMON BRAND NAME(S): Prolia, XGEVA What should I tell my care team  before I take this medication? They need to know if you have any of these conditions: dental disease having surgery or tooth extraction infection kidney disease low levels of calcium or Vitamin D in the blood malnutrition on hemodialysis skin conditions or sensitivity thyroid or parathyroid disease an unusual reaction to denosumab, other medicines, foods, dyes, or preservatives pregnant or trying to get pregnant breast-feeding How should I use this medication? This medicine is for injection under the skin. It is given by a health care professional in a hospital or clinic setting. A special MedGuide will be given to you before each treatment. Be sure to read this information carefully each time. For Prolia, talk to your pediatrician regarding the use of this medicine in children. Special care may be needed. For Delton See, talk to your pediatrician regarding the use of this medicine in children. While this drug may be prescribed for children as young as 13 years for selected conditions, precautions do apply. Overdosage: If you think you have taken too much of this medicine contact a poison control center or emergency room at once. NOTE: This medicine is only for you. Do not share this medicine with others. What if I miss a dose? It is important not to miss your dose. Call your doctor or health care professional if you are unable to keep an appointment. What may interact with this medication? Do not take this medicine with any of the following medications: other medicines containing denosumab This medicine may also interact with the following medications: medicines that lower your chance of fighting infection steroid medicines like prednisone or cortisone This list may not describe all possible interactions. Give your health care provider a list of all the medicines, herbs, non-prescription drugs, or dietary supplements you use. Also tell them if you smoke, drink alcohol, or use illegal drugs.  Some items may interact with your medicine. What should I watch for while using this medication? Visit your doctor or health care professional for regular checks on your progress. Your doctor or health care professional may order blood tests and other tests to see how you are doing. Call your doctor or health care professional for advice if you get a fever, chills or sore throat, or other symptoms of a cold or flu. Do not treat yourself. This drug may decrease your body's ability to fight infection. Try to avoid being around people who are sick. You should make sure you get enough calcium and vitamin D while you are taking this medicine, unless your doctor tells you not to. Discuss the foods you eat and the vitamins you take with your health care professional. See your dentist regularly. Brush and floss your teeth as directed. Before you have any dental work done, tell your dentist you are receiving this medicine. Do not become pregnant while taking this medicine or for 5 months after stopping it. Talk with your doctor or health care professional about your birth control options while taking this medicine. Women should inform their doctor if they wish to become pregnant or think they might be pregnant. There is a potential for serious side effects to an unborn child. Talk to your health care professional  or pharmacist for more information. What side effects may I notice from receiving this medication? Side effects that you should report to your doctor or health care professional as soon as possible: allergic reactions like skin rash, itching or hives, swelling of the face, lips, or tongue bone pain breathing problems dizziness jaw pain, especially after dental work redness, blistering, peeling of the skin signs and symptoms of infection like fever or chills; cough; sore throat; pain or trouble passing urine signs of low calcium like fast heartbeat, muscle cramps or muscle pain; pain, tingling,  numbness in the hands or feet; seizures unusual bleeding or bruising unusually weak or tired Side effects that usually do not require medical attention (report to your doctor or health care professional if they continue or are bothersome): constipation diarrhea headache joint pain loss of appetite muscle pain runny nose tiredness upset stomach This list may not describe all possible side effects. Call your doctor for medical advice about side effects. You may report side effects to FDA at 1-800-FDA-1088. Where should I keep my medication? This medicine is only given in a clinic, doctor's office, or other health care setting and will not be stored at home. NOTE: This sheet is a summary. It may not cover all possible information. If you have questions about this medicine, talk to your doctor, pharmacist, or health care provider.  2022 Elsevier/Gold Standard (2017-05-31 16:10:44)

## 2020-12-22 NOTE — Progress Notes (Signed)
Hematology and Oncology Follow Up Visit  Vincent David [id number] [birth year] 55 y.o. 12/22/2020   Principle Diagnosis:  Metastatic Renal Cell Carcinoma - bone mets  Current Therapy:   Nivoumab/Ipilimumab -- s/p cycle #3 -- started on 09/29/2019 -- maintenance Nivolumab q month -- Started on 12/29/2019 Xgeva 120 mg sq q 3 months -- next dose in 02/2021 XRT to right iliac mass/ L5 vertebral body/ Left femoral neck LEFT hip repair Cryoablation of left kidney mass-10/19/2020     Interim History:  Vincent David is back for follow-up.  As always, he is doing quite well.  He really has no specific complaints.  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  He did have the ablation of the left kidney mass back in September.  Interventional Radiology is managing this part with respect to scans.  His last PET scan was back in June.  We probably need to get another one on him to see how things are going.  He has had no problems with bowels or bladder.  There is been no bleeding.  There is no cough or shortness of breath.  He did have COVID back in September.  Currently, I would say his performance status is ECOG 0.    Medications:  Current Outpatient Medications:    amLODipine (NORVASC) 5 MG tablet, Take 1 tablet (5 mg total) by mouth daily., Disp: 30 tablet, Rfl: 3   aspirin EC 81 MG tablet, Take 1 tablet (81 mg total) by mouth 2 (two) times daily., Disp: 60 tablet, Rfl: 0   ibuprofen (ADVIL) 200 MG tablet, Take 400 mg by mouth every 8 (eight) hours as needed (pain.)., Disp: , Rfl:    lamoTRIgine (LAMICTAL) 100 MG tablet, TAKE 1 TABLET BY MOUTH TWICE A DAY, Disp: 180 tablet, Rfl: 3   oxyCODONE (OXYCONTIN) 15 mg 12 hr tablet, Take 1 tablet (15 mg total) by mouth every 12 (twelve) hours. (Patient taking differently: Take 15 mg by mouth 2 (two) times daily as needed (pain.).), Disp: 60 tablet, Rfl: 0   Oxycodone HCl 10 MG TABS, Take 1 tablet (10 mg total) by mouth every 4 (four) hours as  needed., Disp: 90 tablet, Rfl: 0 No current facility-administered medications for this visit.  Facility-Administered Medications Ordered in Other Visits:    sodium chloride flush (NS) 0.9 % injection 10 mL, 10 mL, Intracatheter, PRN, Volanda Napoleon, MD, 10 mL at 05/19/20 1343  Allergies: No Known Allergies  Past Medical History, Surgical history, Social history, and Family History were reviewed and updated.  Review of Systems: Review of Systems  Constitutional: Negative.   HENT:  Negative.    Eyes: Negative.   Respiratory: Negative.    Cardiovascular: Negative.   Gastrointestinal: Negative.   Endocrine: Negative.   Musculoskeletal:  Positive for back pain and flank pain.  Skin: Negative.   Neurological: Negative.   Hematological: Negative.   Psychiatric/Behavioral: Negative.     Physical Exam:  height is 5\' 7"  (1.702 m) and weight is 207 lb (93.9 kg). His oral temperature is 97.7 F (36.5 C). His blood pressure is 127/69 and his pulse is 86. His respiration is 18 and oxygen saturation is 100%.   Wt Readings from Last 3 Encounters:  12/22/20 207 lb (93.9 kg)  11/17/20 212 lb (96.2 kg)  10/19/20 223 lb 5.2 oz (101.3 kg)    Physical Exam Vitals reviewed.  HENT:     Head: Normocephalic and atraumatic.  Eyes:     Pupils: Pupils are  equal, round, and reactive to light.  Cardiovascular:     Rate and Rhythm: Normal rate and regular rhythm.     Heart sounds: Normal heart sounds.  Pulmonary:     Effort: Pulmonary effort is normal.     Breath sounds: Normal breath sounds.  Abdominal:     General: Bowel sounds are normal.     Palpations: Abdomen is soft.  Musculoskeletal:        General: No tenderness or deformity. Normal range of motion.     Cervical back: Normal range of motion.  Lymphadenopathy:     Cervical: No cervical adenopathy.  Skin:    General: Skin is warm and dry.     Findings: No erythema or rash.  Neurological:     Mental Status: He is alert and oriented  to person, place, and time.  Psychiatric:        Behavior: Behavior normal.        Thought Content: Thought content normal.        Judgment: Judgment normal.     Lab Results  Component Value Date   WBC 7.5 12/22/2020   HGB 14.8 12/22/2020   HCT 43.4 12/22/2020   MCV 93.5 12/22/2020   PLT 237 12/22/2020     Chemistry      Component Value Date/Time   NA 138 12/22/2020 1311   K 4.1 12/22/2020 1311   CL 104 12/22/2020 1311   CO2 27 12/22/2020 1311   BUN 13 12/22/2020 1311   CREATININE 0.98 12/22/2020 1311   CREATININE 1.15 08/25/2019 1450      Component Value Date/Time   CALCIUM 9.2 12/22/2020 1311   ALKPHOS 98 12/22/2020 1311   AST 15 12/22/2020 1311   ALT 15 12/22/2020 1311   BILITOT 0.4 12/22/2020 1311      Impression and Plan: Vincent David is a very nice 55 year old white male.  He has metastatic renal cell carcinoma.  For some reason, his disease clearly favors his bones.  His lungs and liver and lymph nodes all appear to be clean.  We will have to set him up with another PET scan.  His last 1 was back in June.  Thankfully, his primary kidney mass was ablated.  We will go ahead with the nivolumab today.  Him is glad that his quality life is doing so well.  I know he will have a wonderful Thanksgiving and Christmas.  I 11/17/20227:00 PM

## 2020-12-27 ENCOUNTER — Other Ambulatory Visit (HOSPITAL_BASED_OUTPATIENT_CLINIC_OR_DEPARTMENT_OTHER): Payer: Self-pay

## 2021-01-20 ENCOUNTER — Ambulatory Visit (HOSPITAL_COMMUNITY)
Admission: RE | Admit: 2021-01-20 | Discharge: 2021-01-20 | Disposition: A | Discharge status: Home / Self Care | Payer: BC Managed Care – PPO | Source: Ambulatory Visit | Attending: Hematology & Oncology | Admitting: Hematology & Oncology

## 2021-01-20 DIAGNOSIS — C642 Malignant neoplasm of left kidney, except renal pelvis: Secondary | ICD-10-CM | POA: Insufficient documentation

## 2021-01-20 DIAGNOSIS — C649 Malignant neoplasm of unspecified kidney, except renal pelvis: Secondary | ICD-10-CM | POA: Diagnosis not present

## 2021-01-20 LAB — GLUCOSE, CAPILLARY: Glucose-Capillary: 125 mg/dL — ABNORMAL HIGH (ref 70–99)

## 2021-01-20 MED ORDER — FLUDEOXYGLUCOSE F - 18 (FDG) INJECTION
10.1500 | Freq: Once | INTRAVENOUS | Status: AC | PRN
  Administered 2021-01-20: 10.15 via INTRAVENOUS

## 2021-01-23 ENCOUNTER — Encounter: Payer: Self-pay | Admitting: *Deleted

## 2021-02-02 ENCOUNTER — Inpatient Hospital Stay (HOSPITAL_BASED_OUTPATIENT_CLINIC_OR_DEPARTMENT_OTHER): Payer: BC Managed Care – PPO | Admitting: Hematology & Oncology

## 2021-02-02 ENCOUNTER — Inpatient Hospital Stay: Payer: BC Managed Care – PPO | Attending: Hematology & Oncology

## 2021-02-02 ENCOUNTER — Inpatient Hospital Stay: Payer: BC Managed Care – PPO

## 2021-02-02 ENCOUNTER — Encounter: Payer: Self-pay | Admitting: Hematology & Oncology

## 2021-02-02 ENCOUNTER — Other Ambulatory Visit: Payer: Self-pay

## 2021-02-02 VITALS — BP 131/67 | HR 84 | Temp 98.3°F | Resp 18 | Wt 211.0 lb

## 2021-02-02 DIAGNOSIS — Z7982 Long term (current) use of aspirin: Secondary | ICD-10-CM | POA: Diagnosis not present

## 2021-02-02 DIAGNOSIS — Z79899 Other long term (current) drug therapy: Secondary | ICD-10-CM | POA: Insufficient documentation

## 2021-02-02 DIAGNOSIS — C642 Malignant neoplasm of left kidney, except renal pelvis: Secondary | ICD-10-CM | POA: Diagnosis not present

## 2021-02-02 DIAGNOSIS — C7951 Secondary malignant neoplasm of bone: Secondary | ICD-10-CM | POA: Diagnosis not present

## 2021-02-02 DIAGNOSIS — Z5112 Encounter for antineoplastic immunotherapy: Secondary | ICD-10-CM | POA: Insufficient documentation

## 2021-02-02 LAB — CMP (CANCER CENTER ONLY)
ALT: 16 U/L (ref 0–44)
AST: 15 U/L (ref 15–41)
Albumin: 4.1 g/dL (ref 3.5–5.0)
Alkaline Phosphatase: 91 U/L (ref 38–126)
Anion gap: 6 (ref 5–15)
BUN: 17 mg/dL (ref 6–20)
CO2: 27 mmol/L (ref 22–32)
Calcium: 8.5 mg/dL — ABNORMAL LOW (ref 8.9–10.3)
Chloride: 102 mmol/L (ref 98–111)
Creatinine: 0.99 mg/dL (ref 0.61–1.24)
GFR, Estimated: >60 mL/min (ref >60)
Glucose, Bld: 123 mg/dL — ABNORMAL HIGH (ref 70–99)
Potassium: 4 mmol/L (ref 3.5–5.1)
Sodium: 135 mmol/L (ref 135–145)
Total Bilirubin: 0.5 mg/dL (ref 0.3–1.2)
Total Protein: 6.4 g/dL — ABNORMAL LOW (ref 6.5–8.1)

## 2021-02-02 LAB — CBC WITH DIFFERENTIAL (CANCER CENTER ONLY)
Abs Immature Granulocytes: 0.04 10*3/uL (ref 0.00–0.07)
Basophils Absolute: 0 10*3/uL (ref 0.0–0.1)
Basophils Relative: 1 %
Eosinophils Absolute: 0.4 10*3/uL (ref 0.0–0.5)
Eosinophils Relative: 7 %
HCT: 42.8 % (ref 39.0–52.0)
Hemoglobin: 14.5 g/dL (ref 13.0–17.0)
Immature Granulocytes: 1 %
Lymphocytes Relative: 14 %
Lymphs Abs: 0.9 10*3/uL (ref 0.7–4.0)
MCH: 31.2 pg (ref 26.0–34.0)
MCHC: 33.9 g/dL (ref 30.0–36.0)
MCV: 92 fL (ref 80.0–100.0)
Monocytes Absolute: 0.5 10*3/uL (ref 0.1–1.0)
Monocytes Relative: 7 %
Neutro Abs: 4.7 10*3/uL (ref 1.7–7.7)
Neutrophils Relative %: 70 %
Platelet Count: 215 10*3/uL (ref 150–400)
RBC: 4.65 MIL/uL (ref 4.22–5.81)
RDW: 13.2 % (ref 11.5–15.5)
WBC Count: 6.6 10*3/uL (ref 4.0–10.5)
nRBC: 0 % (ref 0.0–0.2)

## 2021-02-02 LAB — LACTATE DEHYDROGENASE: LDH: 147 U/L (ref 98–192)

## 2021-02-02 MED ORDER — SODIUM CHLORIDE 0.9% FLUSH
10.0000 mL | INTRAVENOUS | Status: DC | PRN
  Administered 2021-02-02: 11:00:00 10 mL

## 2021-02-02 MED ORDER — SODIUM CHLORIDE 0.9 % IV SOLN: Freq: Once | INTRAVENOUS | Status: AC

## 2021-02-02 MED ORDER — HEPARIN SOD (PORK) LOCK FLUSH 100 UNIT/ML IV SOLN
500.0000 [IU] | Freq: Once | INTRAVENOUS | Status: AC | PRN
  Administered 2021-02-02: 11:00:00 500 [IU]

## 2021-02-02 MED ORDER — SODIUM CHLORIDE 0.9 % IV SOLN
480.0000 mg | Freq: Once | INTRAVENOUS | Status: AC
  Administered 2021-02-02: 11:00:00 480 mg via INTRAVENOUS
  Filled 2021-02-02: qty 48

## 2021-02-02 NOTE — Patient Instructions (Signed)

## 2021-02-02 NOTE — Patient Instructions (Signed)
[facility] [place] [place]  Discharge Instructions: Thank you for choosing [facility] to provide your oncology and hematology care.   If you have a lab appointment with the Cancer Center, please go directly to the Cancer Center and check in at the registration area.  Wear comfortable clothing and clothing appropriate for easy access to any Portacath or PICC line.   We strive to give you quality time with your provider. You may need to reschedule your appointment if you arrive late (15 or more minutes).  Arriving late affects you and other patients whose appointments are after yours.  Also, if you miss three or more appointments without notifying the office, you may be dismissed from the clinic at the provider's discretion.      For prescription refill requests, have your pharmacy contact our office and allow 72 hours for refills to be completed.    Today you received the following chemotherapy and/or immunotherapy agents:  Opdivo      To help prevent nausea and vomiting after your treatment, we encourage you to take your nausea medication as directed.  BELOW ARE SYMPTOMS THAT SHOULD BE REPORTED IMMEDIATELY: *FEVER GREATER THAN 100.4 F (38 C) OR HIGHER *CHILLS OR SWEATING *NAUSEA AND VOMITING THAT IS NOT CONTROLLED WITH YOUR NAUSEA MEDICATION *UNUSUAL SHORTNESS OF BREATH *UNUSUAL BRUISING OR BLEEDING *URINARY PROBLEMS (pain or burning when urinating, or frequent urination) *BOWEL PROBLEMS (unusual diarrhea, constipation, pain near the anus) TENDERNESS IN MOUTH AND THROAT WITH OR WITHOUT PRESENCE OF ULCERS (sore throat, sores in mouth, or a toothache) UNUSUAL RASH, SWELLING OR PAIN  UNUSUAL VAGINAL DISCHARGE OR ITCHING   Items with * indicate a potential emergency and should be followed up as soon as possible or go to the Emergency Department if any problems should occur.  Please show the CHEMOTHERAPY ALERT CARD or IMMUNOTHERAPY ALERT CARD at check-in to the  Emergency Department and triage nurse. Should you have questions after your visit or need to cancel or reschedule your appointment, please contact [facility]  [phone] and follow the prompts.  Office hours are 8:00 a.m. to 4:30 p.m. Monday - Friday. Please note that voicemails left after 4:00 p.m. may not be returned until the following business day.  We are closed weekends and major holidays. You have access to a nurse at all times for urgent questions. Please call the main number to the clinic [phone] and follow the prompts.  For any non-urgent questions, you may also contact your provider using MyChart. We now offer e-Visits for anyone 18 and older to request care online for non-urgent symptoms. For details visit [email or web address].   Also download the MyChart app! Go to the app store, search "MyChart", open the app, select [facility], and log in with your MyChart username and password.  Due to Covid, a mask is required upon entering the hospital/clinic. If you do not have a mask, one will be given to you upon arrival. For doctor visits, patients may have 1 support person aged 18 or older with them. For treatment visits, patients cannot have anyone with them due to current Covid guidelines and our immunocompromised population.  

## 2021-02-02 NOTE — Progress Notes (Signed)
Hematology and Oncology Follow Up Visit  Vincent David [id number] Mar 03, 1965 55 y.o. 02/02/2021   Principle Diagnosis:  Metastatic Renal Cell Carcinoma - bone mets  Current Therapy:   Nivoumab/Ipilimumab -- s/p cycle #3 -- started on 09/29/2019 -- maintenance Nivolumab q month -- Started on 12/29/2019 Xgeva 120 mg sq q 3 months -- next dose in 02/2021 XRT to right iliac mass/ L5 vertebral body/ Left femoral neck LEFT hip repair Cryoablation of left kidney mass-10/19/2020     Interim History:  Vincent David is back for follow-up.  He had a very nice Thanksgiving and Christmas.  It was pretty low-key.  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  He did have a PET scan done on 01/20/2021.  This did show a continued response.  He had a nice area of cryoablation of the left kidney.  There is no new or increased activity in his bones.  He says he is bothered mostly mostly by achiness in the left hip where he had the hip repair.  He has had radiation to his bones.  His calcium is little bit on the low side today.  He does not get Xgeva until his next appointment.  I told him to increase his vitamin D to 2000 units a day.  I also told him to try some Tums.  He has had no change in bowel or bladder habits.  He has had no nausea or vomiting.  There is been no cough or shortness of breath.  He has had no rashes.  There has been no bleeding.  He has had no headache.  Overall, his performance status is ECOG 1.      Medications:  Current Outpatient Medications:    amLODipine (NORVASC) 5 MG tablet, Take 1 tablet (5 mg total) by mouth daily., Disp: 30 tablet, Rfl: 3   aspirin EC 81 MG tablet, Take 1 tablet (81 mg total) by mouth 2 (two) times daily., Disp: 60 tablet, Rfl: 0   ibuprofen (ADVIL) 200 MG tablet, Take 400 mg by mouth every 8 (eight) hours as needed (pain.)., Disp: , Rfl:    lamoTRIgine (LAMICTAL) 100 MG tablet, TAKE 1 TABLET BY MOUTH TWICE A DAY, Disp: 180 tablet, Rfl:  3   oxyCODONE (OXYCONTIN) 15 mg 12 hr tablet, Take 1 tablet (15 mg total) by mouth every 12 (twelve) hours. (Patient taking differently: Take 15 mg by mouth 2 (two) times daily as needed (pain.).), Disp: 60 tablet, Rfl: 0   Oxycodone HCl 10 MG TABS, Take 1 tablet (10 mg total) by mouth every 4 (four) hours as needed., Disp: 90 tablet, Rfl: 0 No current facility-administered medications for this visit.  Facility-Administered Medications Ordered in Other Visits:    sodium chloride flush (NS) 0.9 % injection 10 mL, 10 mL, Intracatheter, PRN, Volanda Napoleon, MD, 10 mL at 05/19/20 1343  Allergies: No Known Allergies  Past Medical History, Surgical history, Social history, and Family History were reviewed and updated.  Review of Systems: Review of Systems  Constitutional: Negative.   HENT:  Negative.    Eyes: Negative.   Respiratory: Negative.    Cardiovascular: Negative.   Gastrointestinal: Negative.   Endocrine: Negative.   Musculoskeletal:  Positive for back pain and flank pain.  Skin: Negative.   Neurological: Negative.   Hematological: Negative.   Psychiatric/Behavioral: Negative.     Physical Exam:  weight is 211 lb (95.7 kg). His oral temperature is 98.3 F (36.8 C). His blood pressure  is 131/67 and his pulse is 84. His respiration is 18 and oxygen saturation is 99%.   Wt Readings from Last 3 Encounters:  02/02/21 211 lb (95.7 kg)  12/22/20 207 lb (93.9 kg)  11/17/20 212 lb (96.2 kg)    Physical Exam Vitals reviewed.  HENT:     Head: Normocephalic and atraumatic.  Eyes:     Pupils: Pupils are equal, round, and reactive to light.  Cardiovascular:     Rate and Rhythm: Normal rate and regular rhythm.     Heart sounds: Normal heart sounds.  Pulmonary:     Effort: Pulmonary effort is normal.     Breath sounds: Normal breath sounds.  Abdominal:     General: Bowel sounds are normal.     Palpations: Abdomen is soft.  Musculoskeletal:        General: No tenderness or  deformity. Normal range of motion.     Cervical back: Normal range of motion.  Lymphadenopathy:     Cervical: No cervical adenopathy.  Skin:    General: Skin is warm and dry.     Findings: No erythema or rash.  Neurological:     Mental Status: He is alert and oriented to person, place, and time.  Psychiatric:        Behavior: Behavior normal.        Thought Content: Thought content normal.        Judgment: Judgment normal.     Lab Results  Component Value Date   WBC 6.6 02/02/2021   HGB 14.5 02/02/2021   HCT 42.8 02/02/2021   MCV 92.0 02/02/2021   PLT 215 02/02/2021     Chemistry      Component Value Date/Time   NA 135 02/02/2021 0838   K 4.0 02/02/2021 0838   CL 102 02/02/2021 0838   CO2 27 02/02/2021 0838   BUN 17 02/02/2021 0838   CREATININE 0.99 02/02/2021 0838   CREATININE 1.15 08/25/2019 1450      Component Value Date/Time   CALCIUM 8.5 (L) 02/02/2021 0838   ALKPHOS 91 02/02/2021 0838   AST 15 02/02/2021 0838   ALT 16 02/02/2021 0838   BILITOT 0.5 02/02/2021 0838      Impression and Plan: Vincent David is a very nice 55 year old white male.  He has metastatic renal cell carcinoma.  For some reason, his disease clearly favors his bones.  His lungs and liver and lymph nodes all appear to be clean.  I am glad that the PET scan looks good.  This certainly goes well for the immunotherapy that is on.  I am glad that we got the left kidney mass ablated.  I think this is going to have to help with his disease control.  We will continue to treat him every 6 weeks.  I think this is very reasonable.  I will plan to get him back in 6 weeks.  We will do his Xgeva at that time.   12/29/20229:34 AM

## 2021-03-16 ENCOUNTER — Encounter: Payer: Self-pay | Admitting: Hematology & Oncology

## 2021-03-16 ENCOUNTER — Inpatient Hospital Stay: Payer: PRIVATE HEALTH INSURANCE

## 2021-03-16 ENCOUNTER — Other Ambulatory Visit: Payer: Self-pay

## 2021-03-16 ENCOUNTER — Inpatient Hospital Stay: Payer: PRIVATE HEALTH INSURANCE | Attending: Hematology & Oncology | Admitting: Hematology & Oncology

## 2021-03-16 VITALS — BP 151/69 | HR 90 | Temp 98.4°F | Resp 18 | Ht 67.0 in | Wt 214.0 lb

## 2021-03-16 DIAGNOSIS — Z7982 Long term (current) use of aspirin: Secondary | ICD-10-CM | POA: Insufficient documentation

## 2021-03-16 DIAGNOSIS — M25552 Pain in left hip: Secondary | ICD-10-CM | POA: Insufficient documentation

## 2021-03-16 DIAGNOSIS — C7951 Secondary malignant neoplasm of bone: Secondary | ICD-10-CM | POA: Diagnosis not present

## 2021-03-16 DIAGNOSIS — M25551 Pain in right hip: Secondary | ICD-10-CM | POA: Insufficient documentation

## 2021-03-16 DIAGNOSIS — C642 Malignant neoplasm of left kidney, except renal pelvis: Secondary | ICD-10-CM | POA: Diagnosis not present

## 2021-03-16 DIAGNOSIS — Z5112 Encounter for antineoplastic immunotherapy: Secondary | ICD-10-CM | POA: Diagnosis not present

## 2021-03-16 DIAGNOSIS — Z79899 Other long term (current) drug therapy: Secondary | ICD-10-CM | POA: Insufficient documentation

## 2021-03-16 DIAGNOSIS — Z9189 Other specified personal risk factors, not elsewhere classified: Secondary | ICD-10-CM

## 2021-03-16 LAB — CMP (CANCER CENTER ONLY)
ALT: 17 U/L (ref 0–44)
AST: 16 U/L (ref 15–41)
Albumin: 4.2 g/dL (ref 3.5–5.0)
Alkaline Phosphatase: 108 U/L (ref 38–126)
Anion gap: 7 (ref 5–15)
BUN: 17 mg/dL (ref 6–20)
CO2: 26 mmol/L (ref 22–32)
Calcium: 9.2 mg/dL (ref 8.9–10.3)
Chloride: 104 mmol/L (ref 98–111)
Creatinine: 0.94 mg/dL (ref 0.61–1.24)
GFR, Estimated: >60 mL/min (ref >60)
Glucose, Bld: 138 mg/dL — ABNORMAL HIGH (ref 70–99)
Potassium: 4 mmol/L (ref 3.5–5.1)
Sodium: 137 mmol/L (ref 135–145)
Total Bilirubin: 0.3 mg/dL (ref 0.3–1.2)
Total Protein: 6.3 g/dL — ABNORMAL LOW (ref 6.5–8.1)

## 2021-03-16 LAB — CBC WITH DIFFERENTIAL (CANCER CENTER ONLY)
Abs Immature Granulocytes: 0.05 10*3/uL (ref 0.00–0.07)
Basophils Absolute: 0.1 10*3/uL (ref 0.0–0.1)
Basophils Relative: 1 %
Eosinophils Absolute: 0.4 10*3/uL (ref 0.0–0.5)
Eosinophils Relative: 5 %
HCT: 43.8 % (ref 39.0–52.0)
Hemoglobin: 14.9 g/dL (ref 13.0–17.0)
Immature Granulocytes: 1 %
Lymphocytes Relative: 15 %
Lymphs Abs: 1.1 10*3/uL (ref 0.7–4.0)
MCH: 31.2 pg (ref 26.0–34.0)
MCHC: 34 g/dL (ref 30.0–36.0)
MCV: 91.8 fL (ref 80.0–100.0)
Monocytes Absolute: 0.4 10*3/uL (ref 0.1–1.0)
Monocytes Relative: 6 %
Neutro Abs: 5.3 10*3/uL (ref 1.7–7.7)
Neutrophils Relative %: 72 %
Platelet Count: 211 10*3/uL (ref 150–400)
RBC: 4.77 MIL/uL (ref 4.22–5.81)
RDW: 13.3 % (ref 11.5–15.5)
WBC Count: 7.2 10*3/uL (ref 4.0–10.5)
nRBC: 0 % (ref 0.0–0.2)

## 2021-03-16 LAB — LACTATE DEHYDROGENASE: LDH: 199 U/L — ABNORMAL HIGH (ref 98–192)

## 2021-03-16 MED ORDER — OXYCODONE HCL ER 15 MG PO T12A: 15.0000 mg | EXTENDED_RELEASE_TABLET | Freq: Two times a day (BID) | ORAL | 0 refills | Status: DC

## 2021-03-16 MED ORDER — OXYCODONE HCL 10 MG PO TABS: 10.0000 mg | ORAL_TABLET | ORAL | 0 refills | Status: DC | PRN

## 2021-03-16 MED ORDER — SODIUM CHLORIDE 0.9% FLUSH
10.0000 mL | INTRAVENOUS | Status: DC | PRN
  Administered 2021-03-16: 10 mL via INTRAVENOUS

## 2021-03-16 MED ORDER — SODIUM CHLORIDE 0.9 % IV SOLN
480.0000 mg | Freq: Once | INTRAVENOUS | Status: AC
  Administered 2021-03-16: 480 mg via INTRAVENOUS
  Filled 2021-03-16: qty 48

## 2021-03-16 MED ORDER — SODIUM CHLORIDE 0.9 % IV SOLN: Freq: Once | INTRAVENOUS | Status: AC

## 2021-03-16 MED ORDER — HEPARIN SOD (PORK) LOCK FLUSH 100 UNIT/ML IV SOLN
500.0000 [IU] | Freq: Once | INTRAVENOUS | Status: AC
  Administered 2021-03-16: 500 [IU] via INTRAVENOUS

## 2021-03-16 MED ORDER — DENOSUMAB 120 MG/1.7ML ~~LOC~~ SOLN
120.0000 mg | Freq: Once | SUBCUTANEOUS | Status: AC
  Administered 2021-03-16: 120 mg via SUBCUTANEOUS
  Filled 2021-03-16: qty 1.7

## 2021-03-16 NOTE — Patient Instructions (Signed)
Vincent David  Discharge Instructions: Thank you for choosing [facility] to provide your oncology and hematology care.   If you have a lab appointment with the [facility], please go directly to the [facility] and check in at the registration area.  Wear comfortable clothing and clothing appropriate for easy access to any Portacath or PICC line.   We strive to give you quality time with your provider. You may need to reschedule your appointment if you arrive late (15 or more minutes).  Arriving late affects you and other patients whose appointments are after yours.  Also, if you miss three or more appointments without notifying the office, you may be dismissed from the clinic at the providers discretion.      For prescription refill requests, have your pharmacy contact our office and allow 72 hours for refills to be completed.    Today you received the following chemotherapy and/or immunotherapy agents opdivo, exgeva   To help prevent nausea and vomiting after your treatment, we encourage you to take your nausea medication as directed.  BELOW ARE SYMPTOMS THAT SHOULD BE REPORTED IMMEDIATELY: *FEVER GREATER THAN 100.4 F (38 C) OR HIGHER *CHILLS OR SWEATING *NAUSEA AND VOMITING THAT IS NOT CONTROLLED WITH YOUR NAUSEA MEDICATION *UNUSUAL SHORTNESS OF BREATH *UNUSUAL BRUISING OR BLEEDING *URINARY PROBLEMS (pain or burning when urinating, or frequent urination) *BOWEL PROBLEMS (unusual diarrhea, constipation, pain near the anus) TENDERNESS IN MOUTH AND THROAT WITH OR WITHOUT PRESENCE OF ULCERS (sore throat, sores in mouth, or a toothache) UNUSUAL RASH, SWELLING OR PAIN  UNUSUAL VAGINAL DISCHARGE OR ITCHING   Items with * indicate a potential emergency and should be followed up as soon as possible or go to the Emergency Department if any problems should occur.  Please show the CHEMOTHERAPY ALERT CARD or IMMUNOTHERAPY ALERT CARD at check-in to the  Emergency Department and triage nurse. Should you have questions after your visit or need to cancel or reschedule your appointment, please contact [facility]  [phone] and follow the prompts.  Office hours are 8:00 a.m. to 4:30 p.m. Monday - Friday. Please note that voicemails left after 4:00 p.m. may not be returned until the following business day.  We are closed weekends and major holidays. You have access to a nurse at all times for urgent questions. Please call the main number to the clinic [phone] and follow the prompts.  For any non-urgent questions, you may also contact your provider using MyChart. We now offer e-Visits for anyone 9 and older to request care online for non-urgent symptoms. For details visit [email or web address].   Also download the MyChart app! Go to the app store, search "MyChart", open the app, select [facility], and log in with your MyChart username and password.  Due to Covid, a mask is required upon entering the hospital/clinic. If you do not have a mask, one will be given to you upon arrival. For doctor visits, patients may have 1 support person aged 29 or older with them. For treatment visits, patients cannot have anyone with them due to current Covid guidelines and our immunocompromised population.

## 2021-03-16 NOTE — Addendum Note (Signed)
Addended by: Burney Gauze R on: 03/16/2021 02:42 PM   Modules accepted: Orders

## 2021-03-16 NOTE — Addendum Note (Signed)
Addended by: Burney Gauze R on: 03/16/2021 09:22 AM   Modules accepted: Orders

## 2021-03-16 NOTE — Progress Notes (Signed)
Hematology and Oncology Follow Up Visit  Vincent David [id number] 1965-05-19 56 y.o. 03/16/2021   Principle Diagnosis:  Metastatic Renal Cell Carcinoma - bone mets  Current Therapy:   Nivoumab/Ipilimumab -- s/p cycle #3 -- started on 09/29/2019 -- maintenance Nivolumab q month -- Started on 12/29/2019 Xgeva 120 mg sq q 3 months -- next dose in 06/2021 XRT to right iliac mass/ L5 vertebral body/ Left femoral neck LEFT hip repair Cryoablation of left kidney mass-10/19/2020     Interim History:  Vincent David is back for follow-up.  He is doing pretty well.  He comes in with his wife.  He had a nice New Year's.  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  With the warm weather this week, the golf course has been pretty busy.  He is doing well with respect to pain.  He does not like taking the pain medication but does take it on occasion.  He has had no problems with cough or shortness of breath.  He has had no nausea or vomiting.  There is been no change in bowel or bladder habits.  Still has some achiness in his hips.  This is especially when he walks.  He has had no rashes.  There is been no bleeding.  He has had no headache.  Overall, his performance status is ECOG 1.  Still has this     Medications:  Current Outpatient Medications:    amLODipine (NORVASC) 5 MG tablet, Take 1 tablet (5 mg total) by mouth daily., Disp: 30 tablet, Rfl: 3   aspirin EC 81 MG tablet, Take 1 tablet (81 mg total) by mouth 2 (two) times daily., Disp: 60 tablet, Rfl: 0   ibuprofen (ADVIL) 200 MG tablet, Take 400 mg by mouth every 8 (eight) hours as needed (pain.)., Disp: , Rfl:    lamoTRIgine (LAMICTAL) 100 MG tablet, TAKE 1 TABLET BY MOUTH TWICE A DAY, Disp: 180 tablet, Rfl: 3   oxyCODONE (OXYCONTIN) 15 mg 12 hr tablet, Take 1 tablet (15 mg total) by mouth every 12 (twelve) hours. (Patient taking differently: Take 15 mg by mouth 2 (two) times daily as needed (pain.).), Disp: 60 tablet,  Rfl: 0   Oxycodone HCl 10 MG TABS, Take 1 tablet (10 mg total) by mouth every 4 (four) hours as needed., Disp: 90 tablet, Rfl: 0 No current facility-administered medications for this visit.  Facility-Administered Medications Ordered in Other Visits:    sodium chloride flush (NS) 0.9 % injection 10 mL, 10 mL, Intracatheter, PRN, Vincent Napoleon, MD, 10 mL at 05/19/20 1343  Allergies: No Known Allergies  Past Medical History, Surgical history, Social history, and Family History were reviewed and updated.  Review of Systems: Review of Systems  Constitutional: Negative.   HENT:  Negative.    Eyes: Negative.   Respiratory: Negative.    Cardiovascular: Negative.   Gastrointestinal: Negative.   Endocrine: Negative.   Musculoskeletal:  Positive for back pain and flank pain.  Skin: Negative.   Neurological: Negative.   Hematological: Negative.   Psychiatric/Behavioral: Negative.     Physical Exam:  height is 5\' 7"  (1.702 m) and weight is 214 lb 0.6 oz (97.1 kg). His oral temperature is 98.4 F (36.9 C). His blood pressure is 151/69 (abnormal) and his pulse is 90. His respiration is 18 and oxygen saturation is 99%.   Wt Readings from Last 3 Encounters:  03/16/21 214 lb 0.6 oz (97.1 kg)  03/16/21 214 lb 0.6  oz (97.1 kg)  02/02/21 211 lb (95.7 kg)    Physical Exam Vitals reviewed.  HENT:     Head: Normocephalic and atraumatic.  Eyes:     Pupils: Pupils are equal, round, and reactive to light.  Cardiovascular:     Rate and Rhythm: Normal rate and regular rhythm.     Heart sounds: Normal heart sounds.  Pulmonary:     Effort: Pulmonary effort is normal.     Breath sounds: Normal breath sounds.  Abdominal:     General: Bowel sounds are normal.     Palpations: Abdomen is soft.  Musculoskeletal:        General: No tenderness or deformity. Normal range of motion.     Cervical back: Normal range of motion.  Lymphadenopathy:     Cervical: No cervical adenopathy.  Skin:     General: Skin is warm and dry.     Findings: No erythema or rash.  Neurological:     Mental Status: He is alert and oriented to person, place, and time.  Psychiatric:        Behavior: Behavior normal.        Thought Content: Thought content normal.        Judgment: Judgment normal.     Lab Results  Component Value Date   WBC 7.2 03/16/2021   HGB 14.9 03/16/2021   HCT 43.8 03/16/2021   MCV 91.8 03/16/2021   PLT 211 03/16/2021     Chemistry      Component Value Date/Time   NA 135 02/02/2021 0838   K 4.0 02/02/2021 0838   CL 102 02/02/2021 0838   CO2 27 02/02/2021 0838   BUN 17 02/02/2021 0838   CREATININE 0.99 02/02/2021 0838   CREATININE 1.15 08/25/2019 1450      Component Value Date/Time   CALCIUM 8.5 (L) 02/02/2021 0838   ALKPHOS 91 02/02/2021 0838   AST 15 02/02/2021 0838   ALT 16 02/02/2021 0838   BILITOT 0.5 02/02/2021 0838      Impression and Plan: Vincent David is a very nice 56 year old white male.  He has metastatic renal cell carcinoma.  For some reason, his disease clearly favors his bones.  His lungs and liver and lymph nodes all appear to be clean.  We will repeat a PET scan on him we will see him back in March.  Hopefully, we will see that everything is still holding steady.  I am just glad that his quality of life is doing pretty well right now.   We will continue to treat him every 6 weeks.  I think this is very reasonable.   2/9/20238:57 AM

## 2021-03-16 NOTE — Patient Instructions (Signed)

## 2021-03-27 ENCOUNTER — Other Ambulatory Visit: Payer: Self-pay | Admitting: Hematology & Oncology

## 2021-04-07 ENCOUNTER — Encounter: Payer: Self-pay | Admitting: *Deleted

## 2021-04-07 NOTE — Progress Notes (Signed)
Patient and wife calling for assistance on a linkage letter. Patient worked at the [employer] site and has applied to their health foundation due to his cancer diagnosis. The foundation has asked them to have a Linkage Letter sent to show direct link of his bone lesion to his kidney cancer.  ? ?Letter created. Will have Dr Marin Olp sign and fax to [phone] Attn Certifications. ?Reference - [id number] ? ?Oncology Nurse Navigator Documentation ? ?Oncology Nurse Navigator Flowsheets 04/07/2021  ?Abnormal Finding Date -  ?Confirmed Diagnosis Date -  ?Diagnosis Status -  ?Planned Course of Treatment -  ?Phase of Treatment -  ?Chemotherapy Pending- Reason: -  ?Chemotherapy Actual Start Date: -  ?Navigator Follow Up Date: -  ?Navigator Follow Up Reason: -  ?Navigation Complete Date: -  ?Post Navigation: Continue to Follow Patient? -  ?Reason Not Navigating Patient: -  ?Navigator Location [facility]  ?Referral Date to RadOnc/MedOnc -  ?Navigator Encounter Type Telephone;Letter/Fax/Email  ?Telephone -  ?Treatment Initiated Date -  ?Patient Visit Type MedOnc  ?Treatment Phase Active Tx  ?Barriers/Navigation Needs Coordination of Care  ?Education -  ?Interventions Coordination of Care  ?Acuity Level 2-Minimal Needs (1-2 Barriers Identified)  ?Referrals -  ?Coordination of Care Other  ?Education Method -  ?Support Groups/Services Friends and Family  ?Time Spent with Patient 30  ?  ?  ?

## 2021-04-17 ENCOUNTER — Encounter: Payer: Self-pay | Admitting: Hematology & Oncology

## 2021-04-18 ENCOUNTER — Telehealth: Payer: Self-pay | Admitting: Adult Health

## 2021-04-18 NOTE — Telephone Encounter (Signed)
Wife has called to report that about 10 days ago pt had a seizure and moments ago pt just had another.  Wife states reason for call is because it has been so long since he has had one.  Wife states he appears to be ok now and does not need ED, nor did he go to ED last time.  Please call wife. ?

## 2021-04-18 NOTE — Telephone Encounter (Addendum)
Spoke to wife (checked DPR) wife states patient has had seizures for the last month. Wife states this is not normal. Wife states patient is during better now. Did make a appointment for patient tomorrow at 230pm with Megan millikan,NP . Did inform wife if patient worsens please take him to ER . Wife expressed understanding and thanked me for calling her back and for a quick appointment for patient  ?

## 2021-04-19 ENCOUNTER — Ambulatory Visit (INDEPENDENT_AMBULATORY_CARE_PROVIDER_SITE_OTHER): Payer: BC Managed Care – PPO | Admitting: Adult Health

## 2021-04-19 ENCOUNTER — Encounter: Payer: Self-pay | Admitting: Adult Health

## 2021-04-19 VITALS — BP 127/76 | HR 87 | Ht 67.0 in | Wt 213.0 lb

## 2021-04-19 DIAGNOSIS — G4733 Obstructive sleep apnea (adult) (pediatric): Secondary | ICD-10-CM

## 2021-04-19 DIAGNOSIS — Z5181 Encounter for therapeutic drug level monitoring: Secondary | ICD-10-CM | POA: Diagnosis not present

## 2021-04-19 DIAGNOSIS — Z9989 Dependence on other enabling machines and devices: Secondary | ICD-10-CM | POA: Diagnosis not present

## 2021-04-19 DIAGNOSIS — R569 Unspecified convulsions: Secondary | ICD-10-CM

## 2021-04-19 MED ORDER — LAMOTRIGINE 25 MG PO TABS: 25.0000 mg | ORAL_TABLET | Freq: Two times a day (BID) | ORAL | 11 refills | Status: DC

## 2021-04-19 NOTE — Patient Instructions (Signed)
Your Plan: ? ?Increase Lamictal to 125 mg twice a day ?Blood work today ?Continue using CPAP nightly  ? ? ? ? ?Thank you for coming to see [place] at [facility]. I hope we have been able to provide you high quality care today. ? ?You may receive a patient satisfaction survey over the next few weeks. We would appreciate your feedback and comments so that we may continue to improve ourselves and the health of our patients. ? ?

## 2021-04-19 NOTE — Progress Notes (Signed)
? ? ?PATIENT: Vincent David ?DOB: 1965/07/14 ? ?REASON FOR VISIT: follow up ?HISTORY FROM: patient ? ?HISTORY OF PRESENT ILLNESS: ?Today 04/19/21: ? ?Vincent David is a 56 year old male with a history of obstructive sleep apnea on CPAP and seizures. ? ?CPAP: Denies any issues with his CPAP his download is below ? ?Seizures: Patient reports that he has had 2 seizures.  One last month and 1 yesterday.  His seizures are always when he is sleeping. Wife notices that he was moving about, making noises, bit his tongue. No loss of bowel or bladder. Event was the same the month before.  Had gone 19 months with no events. Reports being under more stress. ? ? ? ?07/06/20: Vincent David is a 56 year old male with a history of OSA on CPAP.  He returns today for follow-up.  Reports that the CPAP is working well.  He denies any seizure-like activity.  Continues on Lamictal 100 mg twice a day.  Denies any changes with his gait or balance.  He returns today for an evaluation. ? ? ? ?01/06/20: Vincent David is a 56 year old male with a history of nocturnal seizures and obstructive sleep apnea on CPAP he returns today for follow-up. ? ? ?His CPAP download indicates that he uses machine nightly for compliance of 100%.  He uses machine greater than 4 hours each night.  On average he uses his machine 9 hours and 14 minutes.  His residual AHI is 1.6 on 6 to 15 cm water with EPR of 3.  He reports that the CPAP works well for him.  He does note that some nights he does not sleep well.  Reports that he has a jittery sensation in his knees.  This started after his cancer treatment. ? ?The patient currently is on Lamictal 100 mg twice a day.  He denies any seizure-like events since his visit in July.  He states that sometimes he has an event during the day where his wife reports that he has comprehensible speech.  He states that this only last for several seconds.  He does not feel that it is seizure related.  He feels that his blood sugar may drop.  He  never has checked his blood sugar to confirm this.  He also states that it may be related to his pain medication.  He returns today for an evaluation. ? ?To note: he has had an EMU admission in May but did not show epileptic events.  He has had normal EEGs in the past.  It is questionable whether these events are epileptic or nonepileptic. ? ?HISTORY (Copied from Dr.Dohmeier's note) ? 09/03/2019 in a RV.  ?Vincent David had last been seen in April in this office, at the time we made a decision to wean him off Keppra and onto Lamictal as a medication of choice for his nocturnal recurrent seizure-like events.  He was admitted to the epilepsy monitoring unit from the 24th through 27 May for video EEG monitoring his Keppra had been held at the time he was exposed to hyperventilation and photic stimulation, all his seizures has had happened during sleep therefore sleep deprivation was not a planned part of his work-up.  On the contrary we wanted him to sleep.  No typical events were captured patient requested to go home on the 27th as it was difficult for him to sleep in the hospital environment and multiple times employees had entered the epilepsy monitoring room and disturb him.  The diagnosis of nonepileptic events was  discussed with the patient Keppra had not substantially change the frequency and therefore a change to lamotrigine have been recommended, he had a normal EEG background rhythm of 10 Hz frontocentral region sleep spindles were noted symmetrically no EEG changes in hyperventilation or photic stimulation no epileptiform discharges were seen during the recording in daytime or at night again the patient felt that he was on independently sleep deprived by the comings and goings on the hospital floor.  After his discharge on the 27th he then suffered a seizure in early July this was after he had already been off Keppra and on Lamictal.  He is now using 50 mg twice daily, which we can increase 200 mg twice daily  and further steps however he has at this time another medical concern.  Unfortunately he was diagnosed with a malignancy a finding that occurred while he was worked up for hip pain.PET scan revealed left sided kidney cancer, spreading to lumbar and hip regions. .  ?  ? ?REVIEW OF SYSTEMS: Out of a complete 14 system review of symptoms, the patient complains only of the following symptoms, and all other reviewed systems are negative. ? ? ? ?ALLERGIES: ?No Known Allergies ? ?HOME MEDICATIONS: ?Outpatient Medications Prior to Visit  ?Medication Sig Dispense Refill  ? amLODipine (NORVASC) 5 MG tablet TAKE 1 TABLET EVERY DAY 90 tablet 4  ? aspirin EC 81 MG tablet Take 1 tablet (81 mg total) by mouth 2 (two) times daily. 60 tablet 0  ? ibuprofen (ADVIL) 200 MG tablet Take 400 mg by mouth every 8 (eight) hours as needed (pain.).    ? lamoTRIgine (LAMICTAL) 100 MG tablet TAKE 1 TABLET BY MOUTH TWICE A DAY 180 tablet 3  ? oxyCODONE (OXYCONTIN) 15 mg 12 hr tablet Take 1 tablet (15 mg total) by mouth every 12 (twelve) hours. 60 tablet 0  ? Oxycodone HCl 10 MG TABS Take 1 tablet (10 mg total) by mouth every 4 (four) hours as needed. 90 tablet 0  ? ?Facility-Administered Medications Prior to Visit  ?Medication Dose Route Frequency Provider Last Rate Last Admin  ? sodium chloride flush (NS) 0.9 % injection 10 mL  10 mL Intracatheter PRN Volanda Napoleon, MD   10 mL at 05/19/20 1343  ? ? ?PAST MEDICAL HISTORY: ?Past Medical History:  ?Diagnosis Date  ? Cancer [facility])   ? Cancer of kidney ([place])   ? Goals of care, counseling/discussion 08/28/2019  ? History of COVID-19 10/03/2020  ? Positive COVID swab test  ? Postictal headache   ? Seizure ([place])   ? Sleep apnea   ? Tinnitus   ? ? ?PAST SURGICAL HISTORY: ?Past Surgical History:  ?Procedure Laterality Date  ? IR IMAGING GUIDED PORT INSERTION  09/28/2019  ? IR RADIOLOGIST EVAL & MGMT  08/30/2020  ? IR RADIOLOGIST EVAL & MGMT  12/01/2020  ? KNEE SURGERY Left   ? ORIF PELVIC FRACTURE Left  09/14/2019  ? Procedure: OPEN REDUCTION INTERNAL FIXATION (ORIF) LEFT HIP WITH AFFIXUS NAIL;  Surgeon: Frederik Pear, MD;  Location: [facility];  Service: Orthopedics;  Laterality: Left;  ? RADIOLOGY WITH ANESTHESIA N/A 10/19/2020  ? Procedure: CT CYROABLATION WITH ANESTHESIA;  Surgeon: Criselda Peaches, MD;  Location: [facility];  Service: Radiology;  Laterality: N/A;  ? SHOULDER SURGERY Left   ? ? ?FAMILY HISTORY: ?Family History  ?Problem Relation Age of Onset  ? Sleep apnea Mother   ? Diabetes Mother   ? High blood pressure Father   ?  Cancer Maternal Uncle   ?     type unknown "either pancreatic or kidney"  ? ? ?SOCIAL HISTORY: ?Social History  ? ?Socioeconomic History  ? Marital status: Married  ?  Spouse name: Otila Kluver  ? Number of children: 3  ? Years of education: Not on file  ? Highest education level: Not on file  ?Occupational History  ?  Employer: OTHER  ?Tobacco Use  ? Smoking status: Every Day  ?  Packs/day: 0.50  ?  Types: Cigarettes  ? Smokeless tobacco: Never  ? Tobacco comments:  ?  "Not quite" 0.5 ppd  ?Vaping Use  ? Vaping Use: Never used  ?Substance and Sexual Activity  ? Alcohol use: Yes  ?  Alcohol/week: 1.0 - 2.0 standard drink  ?  Types: 1 - 2 Cans of beer per week  ? Drug use: Never  ? Sexual activity: Yes  ?  Partners: Female  ?Other Topics Concern  ? Not on file  ?Social History Narrative  ? Caffeine: 2 cups/day  ? ?Social Determinants of Health  ? ?Financial Resource Strain: Not on file  ?Food Insecurity: Not on file  ?Transportation Needs: Not on file  ?Physical Activity: Not on file  ?Stress: Not on file  ?Social Connections: Not on file  ?Intimate Partner Violence: Not on file  ? ? ? ? ?PHYSICAL EXAM ? ?Vitals:  ? 04/19/21 1410  ?BP: 127/76  ?Pulse: 87  ?Weight: 213 lb (96.6 kg)  ?Height: '5\' 7"'$  (1.702 m)  ? ?Body mass index is 33.36 kg/m?. ? ?Generalized: Well developed, in no acute distress  ? ?Neurological examination  ?Mentation: Alert oriented to time, place, history taking. Follows all  commands speech and language fluent ?Cranial nerve II-XII: Pupils were equal round reactive to light. Extraocular movements were full, visual field were full on confrontational test. . Head turning and shoulder

## 2021-04-20 LAB — LAMOTRIGINE LEVEL: Lamotrigine Lvl: 2.7 ug/mL (ref 2.0–20.0)

## 2021-04-24 ENCOUNTER — Encounter: Payer: Self-pay | Admitting: Hematology & Oncology

## 2021-04-26 ENCOUNTER — Encounter (HOSPITAL_COMMUNITY)
Admission: RE | Admit: 2021-04-26 | Discharge: 2021-04-26 | Disposition: A | Discharge status: Home / Self Care | Payer: BC Managed Care – PPO | Source: Ambulatory Visit | Attending: Hematology & Oncology | Admitting: Hematology & Oncology

## 2021-04-26 DIAGNOSIS — C7951 Secondary malignant neoplasm of bone: Secondary | ICD-10-CM | POA: Diagnosis not present

## 2021-04-26 DIAGNOSIS — N2889 Other specified disorders of kidney and ureter: Secondary | ICD-10-CM | POA: Diagnosis not present

## 2021-04-26 DIAGNOSIS — C642 Malignant neoplasm of left kidney, except renal pelvis: Secondary | ICD-10-CM | POA: Insufficient documentation

## 2021-04-26 DIAGNOSIS — N281 Cyst of kidney, acquired: Secondary | ICD-10-CM | POA: Diagnosis not present

## 2021-04-26 DIAGNOSIS — C649 Malignant neoplasm of unspecified kidney, except renal pelvis: Secondary | ICD-10-CM | POA: Diagnosis not present

## 2021-04-26 LAB — GLUCOSE, CAPILLARY: Glucose-Capillary: 93 mg/dL (ref 70–99)

## 2021-04-26 MED ORDER — FLUDEOXYGLUCOSE F - 18 (FDG) INJECTION
10.7000 | Freq: Once | INTRAVENOUS | Status: AC
  Administered 2021-04-26: 10.7 via INTRAVENOUS

## 2021-04-27 ENCOUNTER — Telehealth: Payer: Self-pay | Admitting: *Deleted

## 2021-04-27 NOTE — Telephone Encounter (Signed)
-----   Message from Volanda Napoleon, MD sent at 04/27/2021  2:16 PM EDT ----- ?Please call let him know that the PET scan looks fantastic.  No obvious metastatic disease.  The actual tumor in the left kidney is smaller.  Thanks.  Pete ?

## 2021-04-27 NOTE — Telephone Encounter (Signed)
As noted below by Dr. Marin Olp, I informed the patient that his PET scan looks fantastic! There is no obvious metastatic disease. The actual tumor in the left kidney is smaller. He verbalized understanding. ?

## 2021-04-28 ENCOUNTER — Inpatient Hospital Stay: Payer: BC Managed Care – PPO

## 2021-04-28 ENCOUNTER — Inpatient Hospital Stay: Payer: BC Managed Care – PPO | Attending: Hematology & Oncology

## 2021-04-28 ENCOUNTER — Inpatient Hospital Stay (HOSPITAL_BASED_OUTPATIENT_CLINIC_OR_DEPARTMENT_OTHER): Payer: BC Managed Care – PPO | Admitting: Hematology & Oncology

## 2021-04-28 ENCOUNTER — Other Ambulatory Visit: Payer: Self-pay

## 2021-04-28 ENCOUNTER — Encounter: Payer: Self-pay | Admitting: Hematology & Oncology

## 2021-04-28 VITALS — BP 129/65 | HR 74 | Temp 98.8°F | Resp 18 | Wt 210.0 lb

## 2021-04-28 DIAGNOSIS — C7951 Secondary malignant neoplasm of bone: Secondary | ICD-10-CM | POA: Diagnosis not present

## 2021-04-28 DIAGNOSIS — C642 Malignant neoplasm of left kidney, except renal pelvis: Secondary | ICD-10-CM

## 2021-04-28 DIAGNOSIS — Z79899 Other long term (current) drug therapy: Secondary | ICD-10-CM | POA: Insufficient documentation

## 2021-04-28 DIAGNOSIS — Z7982 Long term (current) use of aspirin: Secondary | ICD-10-CM | POA: Insufficient documentation

## 2021-04-28 DIAGNOSIS — Z5111 Encounter for antineoplastic chemotherapy: Secondary | ICD-10-CM | POA: Insufficient documentation

## 2021-04-28 DIAGNOSIS — G40909 Epilepsy, unspecified, not intractable, without status epilepticus: Secondary | ICD-10-CM | POA: Insufficient documentation

## 2021-04-28 DIAGNOSIS — Z791 Long term (current) use of non-steroidal anti-inflammatories (NSAID): Secondary | ICD-10-CM | POA: Insufficient documentation

## 2021-04-28 LAB — CMP (CANCER CENTER ONLY)
ALT: 15 U/L (ref 0–44)
AST: 16 U/L (ref 15–41)
Albumin: 4.4 g/dL (ref 3.5–5.0)
Alkaline Phosphatase: 90 U/L (ref 38–126)
Anion gap: 5 (ref 5–15)
BUN: 15 mg/dL (ref 6–20)
CO2: 25 mmol/L (ref 22–32)
Calcium: 8.9 mg/dL (ref 8.9–10.3)
Chloride: 104 mmol/L (ref 98–111)
Creatinine: 0.98 mg/dL (ref 0.61–1.24)
GFR, Estimated: >60 mL/min (ref >60)
Glucose, Bld: 100 mg/dL — ABNORMAL HIGH (ref 70–99)
Potassium: 4.1 mmol/L (ref 3.5–5.1)
Sodium: 134 mmol/L — ABNORMAL LOW (ref 135–145)
Total Bilirubin: 0.6 mg/dL (ref 0.3–1.2)
Total Protein: 6.8 g/dL (ref 6.5–8.1)

## 2021-04-28 LAB — CBC WITH DIFFERENTIAL (CANCER CENTER ONLY)
Abs Immature Granulocytes: 0.03 10*3/uL (ref 0.00–0.07)
Basophils Absolute: 0 10*3/uL (ref 0.0–0.1)
Basophils Relative: 1 %
Eosinophils Absolute: 0.3 10*3/uL (ref 0.0–0.5)
Eosinophils Relative: 4 %
HCT: 42 % (ref 39.0–52.0)
Hemoglobin: 14.2 g/dL (ref 13.0–17.0)
Immature Granulocytes: 0 %
Lymphocytes Relative: 15 %
Lymphs Abs: 1 10*3/uL (ref 0.7–4.0)
MCH: 31 pg (ref 26.0–34.0)
MCHC: 33.8 g/dL (ref 30.0–36.0)
MCV: 91.7 fL (ref 80.0–100.0)
Monocytes Absolute: 0.5 10*3/uL (ref 0.1–1.0)
Monocytes Relative: 7 %
Neutro Abs: 5 10*3/uL (ref 1.7–7.7)
Neutrophils Relative %: 73 %
Platelet Count: 228 10*3/uL (ref 150–400)
RBC: 4.58 MIL/uL (ref 4.22–5.81)
RDW: 13.1 % (ref 11.5–15.5)
WBC Count: 6.8 10*3/uL (ref 4.0–10.5)
nRBC: 0 % (ref 0.0–0.2)

## 2021-04-28 LAB — LACTATE DEHYDROGENASE: LDH: 159 U/L (ref 98–192)

## 2021-04-28 MED ORDER — SODIUM CHLORIDE 0.9 % IV SOLN
480.0000 mg | Freq: Once | INTRAVENOUS | Status: AC
  Administered 2021-04-28: 480 mg via INTRAVENOUS
  Filled 2021-04-28: qty 48

## 2021-04-28 MED ORDER — SODIUM CHLORIDE 0.9 % IV SOLN: Freq: Once | INTRAVENOUS | Status: AC

## 2021-04-28 MED ORDER — SODIUM CHLORIDE 0.9% FLUSH
10.0000 mL | INTRAVENOUS | Status: DC | PRN
  Administered 2021-04-28: 10 mL

## 2021-04-28 MED ORDER — HEPARIN SOD (PORK) LOCK FLUSH 100 UNIT/ML IV SOLN
500.0000 [IU] | Freq: Once | INTRAVENOUS | Status: AC | PRN
  Administered 2021-04-28: 500 [IU]

## 2021-04-28 NOTE — Progress Notes (Signed)
?Hematology and Oncology Follow Up Visit ? ?Vincent David ?[id number] ?1965/08/26 56 y.o. ?04/28/2021 ? ? ?Principle Diagnosis:  ?Metastatic Renal Cell Carcinoma - bone mets ? ?Current Therapy:   ?Nivoumab/Ipilimumab -- s/p cycle #3 -- started on 09/29/2019 -- maintenance Nivolumab q month -- Started on 12/29/2019 ?Xgeva 120 mg sq q 3 months -- next dose in 06/2021 ?XRT to right iliac mass/ L5 vertebral body/ Left femoral neck ?LEFT hip repair ?Cryoablation of left kidney mass-10/19/2020 ?    ?Interim History:  Vincent David is back for follow-up.  We did do a PET scan on him.  Thankfully, the PET scan did show that he was responding.  He had no his metastatic disease that was active.  His left kidney mass was smaller.  It measured 3.5 x 3.8 cm. ? ?Unfortunately still is bothered by the bony metastasis that he has had.  He actually has been under stress from this.  Because this, he began to have seizures again.  He has had seizures in the past.  He has been on Lamictal for seizures.  He does have a neurologist to follow him. ? ?He is having problems with his back to the discomfort.  Unfortunately, is not much that we can do for the discomfort with the bones.  There is no surgical procedure that he could have.  He has already had his left hip repaired.  He had radiation therapy to his bony issues. ? 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  He does enjoy this.  He works for little bit and then he gets tired. ? ?His appetite has been doing okay.  He has had no nausea or vomiting.  There has been no cough or shortness of breath.  He has had no leg swelling.  There is been no rashes. ? ?Overall, I would say his performance status is ECOG 1.    ? ?Medications:  ?Current Outpatient Medications:  ?  amLODipine (NORVASC) 5 MG tablet, TAKE 1 TABLET EVERY DAY, Disp: 90 tablet, Rfl: 4 ?  aspirin EC 81 MG tablet, Take 1 tablet (81 mg total) by mouth 2 (two) times daily., Disp: 60 tablet, Rfl: 0 ?  ibuprofen (ADVIL) 200  MG tablet, Take 400 mg by mouth every 8 (eight) hours as needed (pain.)., Disp: , Rfl:  ?  lamoTRIgine (LAMICTAL) 100 MG tablet, TAKE 1 TABLET BY MOUTH TWICE A DAY, Disp: 180 tablet, Rfl: 3 ?  lamoTRIgine (LAMICTAL) 25 MG tablet, Take 1 tablet (25 mg total) by mouth 2 (two) times daily., Disp: 60 tablet, Rfl: 11 ?  oxyCODONE (OXYCONTIN) 15 mg 12 hr tablet, Take 1 tablet (15 mg total) by mouth every 12 (twelve) hours., Disp: 60 tablet, Rfl: 0 ?  Oxycodone HCl 10 MG TABS, Take 1 tablet (10 mg total) by mouth every 4 (four) hours as needed., Disp: 90 tablet, Rfl: 0 ?No current facility-administered medications for this visit. ? ?Facility-Administered Medications Ordered in Other Visits:  ?  sodium chloride flush (NS) 0.9 % injection 10 mL, 10 mL, Intracatheter, PRN, Volanda Napoleon, MD, 10 mL at 05/19/20 1343 ? ?Allergies: No Known Allergies ? ?Past Medical History, Surgical history, Social history, and Family History were reviewed and updated. ? ?Review of Systems: ?Review of Systems  ?Constitutional: Negative.   ?HENT:  Negative.    ?Eyes: Negative.   ?Respiratory: Negative.    ?Cardiovascular: Negative.   ?Gastrointestinal: Negative.   ?Endocrine: Negative.   ?Musculoskeletal:  Positive for back pain and  flank pain.  ?Skin: Negative.   ?Neurological: Negative.   ?Hematological: Negative.   ?Psychiatric/Behavioral: Negative.    ? ?Physical Exam: ? weight is 210 lb (95.3 kg). His oral temperature is 98.8 ?F (37.1 ?C). His blood pressure is 129/65 and his pulse is 74. His respiration is 18 and oxygen saturation is 100%.  ? ?Wt Readings from Last 3 Encounters:  ?04/28/21 210 lb (95.3 kg)  ?04/19/21 213 lb (96.6 kg)  ?03/16/21 214 lb 0.6 oz (97.1 kg)  ? ? ?Physical Exam ?Vitals reviewed.  ?HENT:  ?   Head: Normocephalic and atraumatic.  ?Eyes:  ?   Pupils: Pupils are equal, round, and reactive to light.  ?Cardiovascular:  ?   Rate and Rhythm: Normal rate and regular rhythm.  ?   Heart sounds: Normal heart sounds.   ?Pulmonary:  ?   Effort: Pulmonary effort is normal.  ?   Breath sounds: Normal breath sounds.  ?Abdominal:  ?   General: Bowel sounds are normal.  ?   Palpations: Abdomen is soft.  ?Musculoskeletal:     ?   General: No tenderness or deformity. Normal range of motion.  ?   Cervical back: Normal range of motion.  ?Lymphadenopathy:  ?   Cervical: No cervical adenopathy.  ?Skin: ?   General: Skin is warm and dry.  ?   Findings: No erythema or rash.  ?Neurological:  ?   Mental Status: He is alert and oriented to person, place, and time.  ?Psychiatric:     ?   Behavior: Behavior normal.     ?   Thought Content: Thought content normal.     ?   Judgment: Judgment normal.  ? ? ? ?Lab Results  ?Component Value Date  ? WBC 6.8 04/28/2021  ? HGB 14.2 04/28/2021  ? HCT 42.0 04/28/2021  ? MCV 91.7 04/28/2021  ? PLT 228 04/28/2021  ? ?  Chemistry   ?   ?Component Value Date/Time  ? NA 137 03/16/2021 0837  ? K 4.0 03/16/2021 0837  ? CL 104 03/16/2021 0837  ? CO2 26 03/16/2021 0837  ? BUN 17 03/16/2021 0837  ? CREATININE 0.94 03/16/2021 0837  ? CREATININE 1.15 08/25/2019 1450  ?    ?Component Value Date/Time  ? CALCIUM 9.2 03/16/2021 0837  ? ALKPHOS 108 03/16/2021 0837  ? AST 16 03/16/2021 0837  ? ALT 17 03/16/2021 0837  ? BILITOT 0.3 03/16/2021 0837  ?  ? ? ?Impression and Plan: ?Vincent David is a very nice 56 year old white male.  He has metastatic renal cell carcinoma.  For some reason, his disease clearly favors his bones.  His lungs and liver and lymph nodes all appear to be clean. ? ?The PET scan is very encouraging.  I am very happy about this. ? ?For right now, we will just continue him on the immunotherapy.  This seems to be working quite well for him.  I just wish that he would have less issues with his bones.  Unfortunately he has such extensive bone disease when he presented that his bones were affected and he has microfractures. ? ?I know he gets his Xgeva.  Every 3 months.  He is due for this in May. ? ?We will plan to  see him back in May. ? ? ? ?3/24/20239:10 AM ?

## 2021-04-28 NOTE — Patient Instructions (Signed)
[facility]  Discharge Instructions: ?Thank you for choosing [facility] to provide your oncology and hematology care.  ? ?If you have a lab appointment with the [facility], please go directly to the [facility] and check in at the registration area. ? ?Wear comfortable clothing and clothing appropriate for easy access to any Portacath or PICC line.  ? ?We strive to give you quality time with your provider. You may need to reschedule your appointment if you arrive late (15 or more minutes).  Arriving late affects you and other patients whose appointments are after yours.  Also, if you miss three or more appointments without notifying the office, you may be dismissed from the clinic at the provider?s discretion.    ?  ?For prescription refill requests, have your pharmacy contact our office and allow 72 hours for refills to be completed.   ? ?Today you received the following chemotherapy and/or immunotherapy agents opdivo, exgeva ?  ?To help prevent nausea and vomiting after your treatment, we encourage you to take your nausea medication as directed. ? ?BELOW ARE SYMPTOMS THAT SHOULD BE REPORTED IMMEDIATELY: ?*FEVER GREATER THAN 100.4 F (38 ?C) OR HIGHER ?*CHILLS OR SWEATING ?*NAUSEA AND VOMITING THAT IS NOT CONTROLLED WITH YOUR NAUSEA MEDICATION ?*UNUSUAL SHORTNESS OF BREATH ?*UNUSUAL BRUISING OR BLEEDING ?*URINARY PROBLEMS (pain or burning when urinating, or frequent urination) ?*BOWEL PROBLEMS (unusual diarrhea, constipation, pain near the anus) ?TENDERNESS IN MOUTH AND THROAT WITH OR WITHOUT PRESENCE OF ULCERS (sore throat, sores in mouth, or a toothache) ?UNUSUAL RASH, SWELLING OR PAIN  ?UNUSUAL VAGINAL DISCHARGE OR ITCHING  ? ?Items with * indicate a potential emergency and should be followed up as soon as possible or go to the Emergency Department if any problems should occur. ? ?Please show the CHEMOTHERAPY ALERT CARD or IMMUNOTHERAPY ALERT CARD at check-in to the  Emergency Department and triage nurse. ?Should you have questions after your visit or need to cancel or reschedule your appointment, please contact [facility]  [phone] and follow the prompts.  Office hours are 8:00 a.m. to 4:30 p.m. Monday - Friday. Please note that voicemails left after 4:00 p.m. may not be returned until the following business day.  We are closed weekends and major holidays. You have access to a nurse at all times for urgent questions. Please call the main number to the clinic [phone] and follow the prompts. ? ?For any non-urgent questions, you may also contact your provider using MyChart. We now offer e-Visits for anyone 71 and older to request care online for non-urgent symptoms. For details visit [email or web address]. ?  ?Also download the MyChart app! Go to the app store, search "MyChart", open the app, select , and log in with your MyChart username and password. ? ?Due to Covid, a mask is required upon entering the hospital/clinic. If you do not have a mask, one will be given to you upon arrival. For doctor visits, patients may have 1 support person aged 6 or older with them. For treatment visits, patients cannot have anyone with them due to current Covid guidelines and our immunocompromised population.  ?

## 2021-05-18 ENCOUNTER — Other Ambulatory Visit: Payer: Self-pay | Admitting: Interventional Radiology

## 2021-05-18 DIAGNOSIS — C642 Malignant neoplasm of left kidney, except renal pelvis: Secondary | ICD-10-CM

## 2021-05-22 ENCOUNTER — Other Ambulatory Visit: Payer: Self-pay | Admitting: Hematology & Oncology

## 2021-05-23 ENCOUNTER — Other Ambulatory Visit: Payer: Self-pay

## 2021-05-23 MED ORDER — OXYCODONE HCL 10 MG PO TABS: 10.0000 mg | ORAL_TABLET | ORAL | 0 refills | Status: DC | PRN

## 2021-05-26 ENCOUNTER — Telehealth: Payer: Self-pay | Admitting: Adult Health

## 2021-05-26 NOTE — Telephone Encounter (Signed)
Pt wife Otila Kluver, on [place]) calling to inform office pt had a seizure this morning around 7am. ?Seizure was mild as pt did not stop breathing, did not have bathroom issues and did not fall.  ?Pt is doing fine now and wife is monitoring him.  ?

## 2021-05-29 NOTE — Telephone Encounter (Signed)
Spoke to wife of pt.  He had 2 seizures (last Friday 05-26-2021 one at 0700 (last about 10 min (started with screech , slight start of incontinence, but then stopped, no TC movements, then again at 1000 lasted about 20 min with slight bite of tongue, jerking, no incontinence).  Mild compared where he will turn blue.stop breathing.  She did not take to ED or call 911.  She monitored him, made him safe.   He is stable back to baseline. Takes a lot of naps (due to Cancer (kidney). Has been taking Lamotrigine '125mg'$  po BID since 04-19-2021 (last (lamotrigine level 04-19-2021 "2.7").  has appt 10-24-2021 with Dr. Brett Fairy.  Upcoming labs at cancer center 06-09-2021. Had not missed any doses of medication, does not sleep as well (wakes up often , using cpap).Not sure of trigger. I relayed MM/NP was out today but will let her know.  Will see if Dr. Brett Fairy would recommend  prior to Megan's return tomorrow.  ?

## 2021-05-29 NOTE — Telephone Encounter (Signed)
I called wife of pt.  LMVM that returned call re: seizure.  ?

## 2021-05-30 ENCOUNTER — Other Ambulatory Visit: Payer: Self-pay

## 2021-05-30 DIAGNOSIS — Z5181 Encounter for therapeutic drug level monitoring: Secondary | ICD-10-CM

## 2021-05-30 MED ORDER — LAMOTRIGINE 25 MG PO TABS: 50.0000 mg | ORAL_TABLET | Freq: Two times a day (BID) | ORAL | 1 refills | Status: DC

## 2021-05-30 NOTE — Telephone Encounter (Signed)
I called patient. I spoke with patient's wife, Otila Kluver, per DPR. I advised her that patient should increase his lamictal to '150mg'$  BID and come to the office for a lamictal level in one month. Patient's wife is agreeable to this plan. She asked for a 90 day supply of lamical '25mg'$  tablets to be sent to [facility]. Order for lamictal level placed. Patient's wife verbalized understanding. ?

## 2021-05-30 NOTE — Telephone Encounter (Signed)
Please advise patient increase Lamictal to 150 mg twice a day.  Check Lamictal level in 1 month ?

## 2021-06-06 ENCOUNTER — Ambulatory Visit (HOSPITAL_COMMUNITY)
Admission: RE | Admit: 2021-06-06 | Discharge: 2021-06-06 | Disposition: A | Discharge status: Home / Self Care | Payer: BC Managed Care – PPO | Source: Ambulatory Visit | Attending: Interventional Radiology | Admitting: Interventional Radiology

## 2021-06-06 DIAGNOSIS — Z85528 Personal history of other malignant neoplasm of kidney: Secondary | ICD-10-CM | POA: Diagnosis not present

## 2021-06-06 DIAGNOSIS — C642 Malignant neoplasm of left kidney, except renal pelvis: Secondary | ICD-10-CM | POA: Insufficient documentation

## 2021-06-06 DIAGNOSIS — R160 Hepatomegaly, not elsewhere classified: Secondary | ICD-10-CM | POA: Diagnosis not present

## 2021-06-06 DIAGNOSIS — N281 Cyst of kidney, acquired: Secondary | ICD-10-CM | POA: Diagnosis not present

## 2021-06-06 DIAGNOSIS — K7689 Other specified diseases of liver: Secondary | ICD-10-CM | POA: Diagnosis not present

## 2021-06-06 MED ORDER — GADOBUTROL 1 MMOL/ML IV SOLN
9.0000 mL | Freq: Once | INTRAVENOUS | Status: AC | PRN
  Administered 2021-06-06: 9 mL via INTRAVENOUS

## 2021-06-09 ENCOUNTER — Inpatient Hospital Stay: Payer: BC Managed Care – PPO

## 2021-06-09 ENCOUNTER — Encounter: Payer: Self-pay | Admitting: Hematology & Oncology

## 2021-06-09 ENCOUNTER — Inpatient Hospital Stay (HOSPITAL_BASED_OUTPATIENT_CLINIC_OR_DEPARTMENT_OTHER): Payer: BC Managed Care – PPO | Admitting: Hematology & Oncology

## 2021-06-09 ENCOUNTER — Inpatient Hospital Stay: Payer: BC Managed Care – PPO | Attending: Hematology & Oncology

## 2021-06-09 VITALS — BP 147/70 | HR 82 | Temp 97.7°F | Resp 20 | Wt 209.1 lb

## 2021-06-09 DIAGNOSIS — C642 Malignant neoplasm of left kidney, except renal pelvis: Secondary | ICD-10-CM | POA: Diagnosis not present

## 2021-06-09 DIAGNOSIS — Z5112 Encounter for antineoplastic immunotherapy: Secondary | ICD-10-CM | POA: Diagnosis not present

## 2021-06-09 DIAGNOSIS — Z9189 Other specified personal risk factors, not elsewhere classified: Secondary | ICD-10-CM

## 2021-06-09 DIAGNOSIS — C7951 Secondary malignant neoplasm of bone: Secondary | ICD-10-CM | POA: Diagnosis not present

## 2021-06-09 LAB — CBC WITH DIFFERENTIAL (CANCER CENTER ONLY)
Abs Immature Granulocytes: 0.06 10*3/uL (ref 0.00–0.07)
Basophils Absolute: 0.1 10*3/uL (ref 0.0–0.1)
Basophils Relative: 1 %
Eosinophils Absolute: 0.3 10*3/uL (ref 0.0–0.5)
Eosinophils Relative: 4 %
HCT: 43.1 % (ref 39.0–52.0)
Hemoglobin: 14.9 g/dL (ref 13.0–17.0)
Immature Granulocytes: 1 %
Lymphocytes Relative: 13 %
Lymphs Abs: 1.1 10*3/uL (ref 0.7–4.0)
MCH: 31.2 pg (ref 26.0–34.0)
MCHC: 34.6 g/dL (ref 30.0–36.0)
MCV: 90.4 fL (ref 80.0–100.0)
Monocytes Absolute: 0.5 10*3/uL (ref 0.1–1.0)
Monocytes Relative: 6 %
Neutro Abs: 6.5 10*3/uL (ref 1.7–7.7)
Neutrophils Relative %: 75 %
Platelet Count: 228 10*3/uL (ref 150–400)
RBC: 4.77 MIL/uL (ref 4.22–5.81)
RDW: 13.1 % (ref 11.5–15.5)
WBC Count: 8.5 10*3/uL (ref 4.0–10.5)
nRBC: 0 % (ref 0.0–0.2)

## 2021-06-09 LAB — LACTATE DEHYDROGENASE: LDH: 153 U/L (ref 98–192)

## 2021-06-09 LAB — CMP (CANCER CENTER ONLY)
ALT: 13 U/L (ref 0–44)
AST: 14 U/L — ABNORMAL LOW (ref 15–41)
Albumin: 4.4 g/dL (ref 3.5–5.0)
Alkaline Phosphatase: 91 U/L (ref 38–126)
Anion gap: 7 (ref 5–15)
BUN: 23 mg/dL — ABNORMAL HIGH (ref 6–20)
CO2: 25 mmol/L (ref 22–32)
Calcium: 9.4 mg/dL (ref 8.9–10.3)
Chloride: 103 mmol/L (ref 98–111)
Creatinine: 1.04 mg/dL (ref 0.61–1.24)
GFR, Estimated: >60 mL/min (ref >60)
Glucose, Bld: 109 mg/dL — ABNORMAL HIGH (ref 70–99)
Potassium: 4.1 mmol/L (ref 3.5–5.1)
Sodium: 135 mmol/L (ref 135–145)
Total Bilirubin: 0.5 mg/dL (ref 0.3–1.2)
Total Protein: 6.8 g/dL (ref 6.5–8.1)

## 2021-06-09 MED ORDER — HEPARIN SOD (PORK) LOCK FLUSH 100 UNIT/ML IV SOLN
500.0000 [IU] | Freq: Once | INTRAVENOUS | Status: AC | PRN
  Administered 2021-06-09: 500 [IU]

## 2021-06-09 MED ORDER — DENOSUMAB 120 MG/1.7ML ~~LOC~~ SOLN
120.0000 mg | Freq: Once | SUBCUTANEOUS | Status: AC
  Administered 2021-06-09: 120 mg via SUBCUTANEOUS
  Filled 2021-06-09: qty 1.7

## 2021-06-09 MED ORDER — SODIUM CHLORIDE 0.9% FLUSH
10.0000 mL | INTRAVENOUS | Status: DC | PRN
  Administered 2021-06-09: 10 mL

## 2021-06-09 MED ORDER — SODIUM CHLORIDE 0.9 % IV SOLN
480.0000 mg | Freq: Once | INTRAVENOUS | Status: AC
  Administered 2021-06-09: 480 mg via INTRAVENOUS
  Filled 2021-06-09: qty 48

## 2021-06-09 MED ORDER — SODIUM CHLORIDE 0.9 % IV SOLN: Freq: Once | INTRAVENOUS | Status: AC

## 2021-06-09 NOTE — Progress Notes (Signed)
?Hematology and Oncology Follow Up Visit ? ?Keondre Sweeden ?[id number] ?08-Dec-1965 56 y.o. ?06/09/2021 ? ? ?Principle Diagnosis:  ?Metastatic Renal Cell Carcinoma - bone mets ? ?Current Therapy:   ?Nivoumab/Ipilimumab -- s/p cycle #3 -- started on 09/29/2019 -- maintenance Nivolumab q month -- Started on 12/29/2019 ?Xgeva 120 mg sq q 3 months -- next dose in 09/2021 ?XRT to right iliac mass/ L5 vertebral body/ Left femoral neck ?LEFT hip repair ?Cryoablation of left kidney mass-10/19/2020 ?    ?Interim History:  Mr. Gallery is back for follow-up.  He is having little bit more in the way of pain.  He really is not all that keen on taking OxyContin.  He says it causes him to have little bit of a headache.  He does take the oxycodone.  He says that this does help a little bit. ? ?He is now sleeping all that well. ? 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 ? ?He did have a MRI on 06/06/2021.  This showed that he had similar size of the mass in the left kidney.  It measured 4.4 in the lateral left kidney and 3.5 cm in the medial left kidney. ? ?He has had no problems with bowels or bladder.  He has had no rashes.  He has had no diarrhea.  He has had no bleeding.  There is been no cough or shortness of breath. ? ?Thankfully, he has had no seizures.  I think he is on Lamictal for this. ? ?Overall, his performance status is ECOG 1.   ? ?Medications:  ?Current Outpatient Medications:  ?  amLODipine (NORVASC) 5 MG tablet, TAKE 1 TABLET EVERY DAY, Disp: 90 tablet, Rfl: 4 ?  aspirin EC 81 MG tablet, Take 1 tablet (81 mg total) by mouth 2 (two) times daily., Disp: 60 tablet, Rfl: 0 ?  ibuprofen (ADVIL) 200 MG tablet, Take 400 mg by mouth every 8 (eight) hours as needed (pain.)., Disp: , Rfl:  ?  lamoTRIgine (LAMICTAL) 100 MG tablet, TAKE 1 TABLET BY MOUTH TWICE A DAY, Disp: 180 tablet, Rfl: 3 ?  lamoTRIgine (LAMICTAL) 25 MG tablet, Take 2 tablets (50 mg total) by mouth 2 (two) times daily., Disp: 360 tablet, Rfl: 1 ?  oxyCODONE  (OXYCONTIN) 15 mg 12 hr tablet, Take 1 tablet (15 mg total) by mouth every 12 (twelve) hours., Disp: 60 tablet, Rfl: 0 ?  Oxycodone HCl 10 MG TABS, Take 1 tablet (10 mg total) by mouth every 4 (four) hours as needed., Disp: 90 tablet, Rfl: 0 ?No current facility-administered medications for this visit. ? ?Facility-Administered Medications Ordered in Other Visits:  ?  sodium chloride flush (NS) 0.9 % injection 10 mL, 10 mL, Intracatheter, PRN, Volanda Napoleon, MD, 10 mL at 05/19/20 1343 ? ?Allergies: No Known Allergies ? ?Past Medical History, Surgical history, Social history, and Family History were reviewed and updated. ? ?Review of Systems: ?Review of Systems  ?Constitutional: Negative.   ?HENT:  Negative.    ?Eyes: Negative.   ?Respiratory: Negative.    ?Cardiovascular: Negative.   ?Gastrointestinal: Negative.   ?Endocrine: Negative.   ?Musculoskeletal:  Positive for back pain and flank pain.  ?Skin: Negative.   ?Neurological: Negative.   ?Hematological: Negative.   ?Psychiatric/Behavioral: Negative.    ? ?Physical Exam: ? weight is 209 lb 1.3 oz (94.8 kg). His oral temperature is 97.7 ?F (36.5 ?C). His blood pressure is 147/70 (abnormal) and his pulse is 82. His respiration is 20 and oxygen saturation is  99%.  ? ?Wt Readings from Last 3 Encounters:  ?06/09/21 209 lb 1.3 oz (94.8 kg)  ?04/28/21 210 lb (95.3 kg)  ?04/19/21 213 lb (96.6 kg)  ? ? ?Physical Exam ?Vitals reviewed.  ?HENT:  ?   Head: Normocephalic and atraumatic.  ?Eyes:  ?   Pupils: Pupils are equal, round, and reactive to light.  ?Cardiovascular:  ?   Rate and Rhythm: Normal rate and regular rhythm.  ?   Heart sounds: Normal heart sounds.  ?Pulmonary:  ?   Effort: Pulmonary effort is normal.  ?   Breath sounds: Normal breath sounds.  ?Abdominal:  ?   General: Bowel sounds are normal.  ?   Palpations: Abdomen is soft.  ?Musculoskeletal:     ?   General: No tenderness or deformity. Normal range of motion.  ?   Cervical back: Normal range of motion.   ?Lymphadenopathy:  ?   Cervical: No cervical adenopathy.  ?Skin: ?   General: Skin is warm and dry.  ?   Findings: No erythema or rash.  ?Neurological:  ?   Mental Status: He is alert and oriented to person, place, and time.  ?Psychiatric:     ?   Behavior: Behavior normal.     ?   Thought Content: Thought content normal.     ?   Judgment: Judgment normal.  ? ? ? ?Lab Results  ?Component Value Date  ? WBC 6.8 04/28/2021  ? HGB 14.2 04/28/2021  ? HCT 42.0 04/28/2021  ? MCV 91.7 04/28/2021  ? PLT 228 04/28/2021  ? ?  Chemistry   ?   ?Component Value Date/Time  ? NA 134 (L) 04/28/2021 0845  ? K 4.1 04/28/2021 0845  ? CL 104 04/28/2021 0845  ? CO2 25 04/28/2021 0845  ? BUN 15 04/28/2021 0845  ? CREATININE 0.98 04/28/2021 0845  ? CREATININE 1.15 08/25/2019 1450  ?    ?Component Value Date/Time  ? CALCIUM 8.9 04/28/2021 0845  ? ALKPHOS 90 04/28/2021 0845  ? AST 16 04/28/2021 0845  ? ALT 15 04/28/2021 0845  ? BILITOT 0.6 04/28/2021 0845  ?  ? ? ?Impression and Plan: ?Mr. Hegna is a very nice 56 year old white male.  He has metastatic renal cell carcinoma.  For some reason, his disease clearly favors his bones.  His lungs and liver and lymph nodes all appear to be clean. ? ?So far, the immunotherapy really has worked nicely. ? ?Again, I talked him about taking the OxyContin.  I told him that he has every reason to take it because of the bone metastasis.  He says he will have to think about this.  He just is not a "pill taker."  I totally understand this. ? ?He will get his Delton See today. ? ?We will plan to get him back in another 6 weeks.  I do not think we need another scan probably until the Summer. ? ? ? ?5/5/20238:36 AM ?

## 2021-06-09 NOTE — Patient Instructions (Signed)
[facility]  Discharge Instructions: Thank you for choosing [facility] to provide your oncology and hematology care.   If you have a lab appointment with the Cancer Center, please go directly to the Cancer Center and check in at the registration area.  Wear comfortable clothing and clothing appropriate for easy access to any Portacath or PICC line.   We strive to give you quality time with your provider. You may need to reschedule your appointment if you arrive late (15 or more minutes).  Arriving late affects you and other patients whose appointments are after yours.  Also, if you miss three or more appointments without notifying the office, you may be dismissed from the clinic at the provider's discretion.      For prescription refill requests, have your pharmacy contact our office and allow 72 hours for refills to be completed.    Today you received the following chemotherapy and/or immunotherapy agents:  Opdivo      To help prevent nausea and vomiting after your treatment, we encourage you to take your nausea medication as directed.  BELOW ARE SYMPTOMS THAT SHOULD BE REPORTED IMMEDIATELY: *FEVER GREATER THAN 100.4 F (38 C) OR HIGHER *CHILLS OR SWEATING *NAUSEA AND VOMITING THAT IS NOT CONTROLLED WITH YOUR NAUSEA MEDICATION *UNUSUAL SHORTNESS OF BREATH *UNUSUAL BRUISING OR BLEEDING *URINARY PROBLEMS (pain or burning when urinating, or frequent urination) *BOWEL PROBLEMS (unusual diarrhea, constipation, pain near the anus) TENDERNESS IN MOUTH AND THROAT WITH OR WITHOUT PRESENCE OF ULCERS (sore throat, sores in mouth, or a toothache) UNUSUAL RASH, SWELLING OR PAIN  UNUSUAL VAGINAL DISCHARGE OR ITCHING   Items with * indicate a potential emergency and should be followed up as soon as possible or go to the Emergency Department if any problems should occur.  Please show the CHEMOTHERAPY ALERT CARD or IMMUNOTHERAPY ALERT CARD at check-in to the  Emergency Department and triage nurse. Should you have questions after your visit or need to cancel or reschedule your appointment, please contact [facility]  [phone] and follow the prompts.  Office hours are 8:00 a.m. to 4:30 p.m. Monday - Friday. Please note that voicemails left after 4:00 p.m. may not be returned until the following business day.  We are closed weekends and major holidays. You have access to a nurse at all times for urgent questions. Please call the main number to the clinic [phone] and follow the prompts.  For any non-urgent questions, you may also contact your provider using MyChart. We now offer e-Visits for anyone 18 and older to request care online for non-urgent symptoms. For details visit [email or web address].   Also download the MyChart app! Go to the app store, search "MyChart", open the app, select , and log in with your MyChart username and password.  Due to Covid, a mask is required upon entering the hospital/clinic. If you do not have a mask, one will be given to you upon arrival. For doctor visits, patients may have 1 support person aged 18 or older with them. For treatment visits, patients cannot have anyone with them due to current Covid guidelines and our immunocompromised population.  

## 2021-06-12 ENCOUNTER — Other Ambulatory Visit: Payer: Self-pay | Admitting: Interventional Radiology

## 2021-06-12 ENCOUNTER — Encounter: Payer: Self-pay | Admitting: *Deleted

## 2021-06-12 ENCOUNTER — Ambulatory Visit
Admission: RE | Admit: 2021-06-12 | Discharge: 2021-06-12 | Disposition: A | Discharge status: Home / Self Care | Payer: PRIVATE HEALTH INSURANCE | Source: Ambulatory Visit | Attending: Interventional Radiology | Admitting: Interventional Radiology

## 2021-06-12 DIAGNOSIS — S34115A Complete lesion of L5 level of lumbar spinal cord, initial encounter: Secondary | ICD-10-CM

## 2021-06-12 DIAGNOSIS — C642 Malignant neoplasm of left kidney, except renal pelvis: Secondary | ICD-10-CM

## 2021-06-12 DIAGNOSIS — C641 Malignant neoplasm of right kidney, except renal pelvis: Secondary | ICD-10-CM | POA: Diagnosis not present

## 2021-06-12 DIAGNOSIS — G8929 Other chronic pain: Secondary | ICD-10-CM

## 2021-06-12 HISTORY — PX: IR RADIOLOGIST EVAL & MGMT: IMG5224

## 2021-06-12 NOTE — Progress Notes (Signed)
? ? ?Chief Complaint: ?Patient was consulted remotely today (TeleHealth) for  at the request of [name]   ? ?Referring Physician(s): ?Burney Gauze, MD ? ?History of Present Illness: ?Vincent David is a 56 y.o. male  with left renal cell carcinoma metastatic to bone.  He has osseous metastases to the left proximal femur, right iliac wing and right aspect of L5.  His disease was diagnosed in 2021 and he has been on immunotherapy since 09/29/19.  Currently on [place].  His bone lesions have been treated with XRT and he underwent prophylactic ORIF of the left proximal femoral lesion.  ?  ?His disease burden has been relatively stable.  He presents today at the kind request of Dr. Marin Olp to discuss minimally invasive treatment of his primary left renal lesion.  There are reports of improvement in remote metastatic disease following treatment/debulking of the primary tumor.  This is thought to be due to removal of pro renal cell growth factors admitted by the primary tumor. ?  ?He underwent successful left renal cryoablation on 10/19/20.  The procedure was uneventful.  Similarly, his recovery was quite easy.  No hematuria, flank pain or abdominal pain.  He has back to full activity and working 2 days/week.   ? ?MRI 06/06/21 - Stable size and appearance of the previously treated mass in the mid left kidney. No evidence of recurrent or new metastatic disease. ? ?He continues to experience low back pain as well as pain in his left hip and upper leg where he underwent prior intramedullary nail and transfemoral neck screw placement prophylactically to prevent pathologic fracture. ? ?Past Medical History:  ?Diagnosis Date  ? Cancer [facility])   ? Cancer of kidney ([place])   ? Goals of care, counseling/discussion 08/28/2019  ? History of COVID-19 10/03/2020  ? Positive COVID swab test  ? Postictal headache   ? Seizure ([place])   ? Sleep apnea   ? Tinnitus   ? ? ?Past Surgical History:  ?Procedure Laterality Date   ? IR IMAGING GUIDED PORT INSERTION  09/28/2019  ? IR RADIOLOGIST EVAL & MGMT  08/30/2020  ? IR RADIOLOGIST EVAL & MGMT  12/01/2020  ? KNEE SURGERY Left   ? ORIF PELVIC FRACTURE Left 09/14/2019  ? Procedure: OPEN REDUCTION INTERNAL FIXATION (ORIF) LEFT HIP WITH AFFIXUS NAIL;  Surgeon: Frederik Pear, MD;  Location: [facility];  Service: Orthopedics;  Laterality: Left;  ? RADIOLOGY WITH ANESTHESIA N/A 10/19/2020  ? Procedure: CT CYROABLATION WITH ANESTHESIA;  Surgeon: Criselda Peaches, MD;  Location: [facility];  Service: Radiology;  Laterality: N/A;  ? SHOULDER SURGERY Left   ? ? ?Allergies: ?Patient has no known allergies. ? ?Medications: ?Prior to Admission medications   ?Medication Sig Start Date End Date Taking? Authorizing Provider  ?amLODipine (NORVASC) 5 MG tablet TAKE 1 TABLET EVERY DAY 03/27/21   Volanda Napoleon, MD  ?aspirin EC 81 MG tablet Take 1 tablet (81 mg total) by mouth 2 (two) times daily. 09/14/19   Leighton Parody, PA-C  ?ibuprofen (ADVIL) 200 MG tablet Take 400 mg by mouth every 8 (eight) hours as needed (pain.).    [provider]  ?lamoTRIgine (LAMICTAL) 100 MG tablet TAKE 1 TABLET BY MOUTH TWICE A DAY 09/12/20   [name], NP  ?lamoTRIgine (LAMICTAL) 25 MG tablet Take 2 tablets (50 mg total) by mouth 2 (two) times daily. 05/30/21   [name], NP  ?oxyCODONE (OXYCONTIN) 15 mg 12 hr tablet Take 1 tablet (15 mg  total) by mouth every 12 (twelve) hours. 03/16/21   Volanda Napoleon, MD  ?Oxycodone HCl 10 MG TABS Take 1 tablet (10 mg total) by mouth every 4 (four) hours as needed. 05/23/21   Volanda Napoleon, MD  ?  ? ?Family History  ?Problem Relation Age of Onset  ? Sleep apnea Mother   ? Diabetes Mother   ? High blood pressure Father   ? Cancer Maternal Uncle   ?     type unknown "either pancreatic or kidney"  ? ? ?Social History  ? ?Socioeconomic History  ? Marital status: Married  ?  Spouse name: Otila Kluver  ? Number of children: 3  ? Years of education: Not on file  ? Highest education level: Not  on file  ?Occupational History  ?  Employer: OTHER  ?Tobacco Use  ? Smoking status: Every Day  ?  Packs/day: 0.25  ?  Years: 15.00  ?  Pack years: 3.75  ?  Types: Cigarettes  ? Smokeless tobacco: Never  ? Tobacco comments:  ?  "Not quite" 0.5 ppd  ?Vaping Use  ? Vaping Use: Never used  ?Substance and Sexual Activity  ? Alcohol use: Yes  ?  Alcohol/week: 1.0 - 2.0 standard drink  ?  Types: 1 - 2 Cans of beer per week  ? Drug use: Never  ? Sexual activity: Yes  ?  Partners: Female  ?Other Topics Concern  ? Not on file  ?Social History Narrative  ? Caffeine: 2 cups/day  ? ?Social Determinants of Health  ? ?Financial Resource Strain: Not on file  ?Food Insecurity: Not on file  ?Transportation Needs: Not on file  ?Physical Activity: Not on file  ?Stress: Not on file  ?Social Connections: Not on file  ? ? ?ECOG Status: ?1 - Symptomatic but completely ambulatory ? ?Review of Systems ? ?Review of Systems: A 12 point ROS discussed and pertinent positives are indicated in the HPI above.  All other systems are negative. ? ?Physical Exam ?No direct physical exam was performed (except for noted visual exam findings with Video Visits).  ?  ?Vital Signs: ?There were no vitals taken for this visit. ? ?Imaging: ?MR ABDOMEN WWO CONTRAST ? ?Result Date: 06/06/2021 ?CLINICAL DATA:  History of RCC follow-up EXAM: MRI ABDOMEN WITHOUT AND WITH CONTRAST TECHNIQUE: Multiplanar multisequence MR imaging of the abdomen was performed both before and after the administration of intravenous contrast. CONTRAST:  65m GADAVIST GADOBUTROL 1 MMOL/ML IV SOLN COMPARISON:  MRI abdomen 07/26/2020 FINDINGS: Lower chest: No acute findings. Hepatobiliary: Liver is enlarged measuring 20.9 cm in length. No suspicious hepatic mass identified. Subcentimeter cyst in the inferior right hepatic lobe. Gallbladder appears normal. No biliary ductal dilatation. Pancreas: No mass, inflammatory changes, or other parenchymal abnormality identified. Spleen:  Within normal  limits in size and appearance. Adrenals/Urinary Tract: Adrenal glands appear normal. Several renal cysts identified bilaterally left greater than right, measuring up to 4.4 cm in the lateral left kidney and 3.5 cm in the medial left kidney. Similar size and appearance of the previously treated cystic and solid mass in the mid left kidney measuring up to 4 cm in size, with no new or suspicious enhancement appreciated. No hydronephrosis bilaterally. Stomach/Bowel: Visualized portions within the abdomen are unremarkable. Vascular/Lymphatic: No pathologically enlarged lymph nodes identified. No abdominal aortic aneurysm demonstrated. Renal veins are patent. Other:  No ascites. Musculoskeletal: Known previous metastatic lesion is again partially seen in the L5 vertebral body. IMPRESSION: 1. Stable size and appearance of  the previously treated mass in the mid left kidney. No evidence of recurrent or new metastatic disease. 2. Bilateral renal cysts. 3. Partially visualized previous known L5 metastasis. 4. Hepatomegaly. Electronically Signed   By: Ofilia Neas M.D.   On: 06/06/2021 08:46   ? ?Labs: ? ?CBC: ?Recent Labs  ?  02/02/21 ?7829 03/16/21 ?5621 04/28/21 ?3086 06/09/21 ?0815  ?WBC 6.6 7.2 6.8 8.5  ?HGB 14.5 14.9 14.2 14.9  ?HCT 42.8 43.8 42.0 43.1  ?PLT 215 211 228 228  ? ? ?COAGS: ?Recent Labs  ?  10/19/20 ?1014  ?INR 1.0  ? ? ?BMP: ?Recent Labs  ?  02/02/21 ?5784 03/16/21 ?6962 04/28/21 ?9528 06/09/21 ?0815  ?NA 135 137 134* 135  ?K 4.0 4.0 4.1 4.1  ?CL 102 104 104 103  ?CO2 '27 26 25 25  '$ ?GLUCOSE 123* 138* 100* 109*  ?BUN '17 17 15 '$ 23*  ?CALCIUM 8.5* 9.2 8.9 9.4  ?CREATININE 0.99 0.94 0.98 1.04  ?GFRNONAA >60 >60 >60 >60  ? ? ?LIVER FUNCTION TESTS: ?Recent Labs  ?  02/02/21 ?4132 03/16/21 ?4401 04/28/21 ?0272 06/09/21 ?0815  ?BILITOT 0.5 0.3 0.6 0.5  ?AST '15 16 16 '$ 14*  ?ALT '16 17 15 13  '$ ?ALKPHOS 91 108 90 91  ?PROT 6.4* 6.3* 6.8 6.8  ?ALBUMIN 4.1 4.2 4.4 4.4  ? ? ?TUMOR MARKERS: ?No results for input(s): AFPTM,  CEA, CA199, CHROMGRNA in the last 8760 hours. ? ?Assessment and Plan: ? ?Pleasant 56 year old gentleman with left renal cell carcinoma and osseous metastatic disease.  He is now 7 months status post per

## 2021-06-21 ENCOUNTER — Encounter: Payer: Self-pay | Admitting: Hematology & Oncology

## 2021-06-23 ENCOUNTER — Encounter: Payer: Self-pay | Admitting: Hematology & Oncology

## 2021-06-23 ENCOUNTER — Ambulatory Visit (HOSPITAL_COMMUNITY)
Admission: RE | Admit: 2021-06-23 | Discharge: 2021-06-23 | Disposition: A | Discharge status: Home / Self Care | Payer: PRIVATE HEALTH INSURANCE | Source: Ambulatory Visit | Attending: Interventional Radiology | Admitting: Interventional Radiology

## 2021-06-23 DIAGNOSIS — S34115A Complete lesion of L5 level of lumbar spinal cord, initial encounter: Secondary | ICD-10-CM

## 2021-06-23 DIAGNOSIS — G8929 Other chronic pain: Secondary | ICD-10-CM

## 2021-06-23 DIAGNOSIS — M5441 Lumbago with sciatica, right side: Secondary | ICD-10-CM | POA: Diagnosis present

## 2021-06-23 DIAGNOSIS — M5442 Lumbago with sciatica, left side: Secondary | ICD-10-CM | POA: Insufficient documentation

## 2021-06-23 MED ORDER — GADOBUTROL 1 MMOL/ML IV SOLN
9.0000 mL | Freq: Once | INTRAVENOUS | Status: AC | PRN
  Administered 2021-06-23: 9 mL via INTRAVENOUS

## 2021-06-27 ENCOUNTER — Other Ambulatory Visit: Payer: Self-pay | Admitting: Hematology & Oncology

## 2021-06-27 ENCOUNTER — Ambulatory Visit
Admission: RE | Admit: 2021-06-27 | Discharge: 2021-06-27 | Disposition: A | Discharge status: Home / Self Care | Payer: BC Managed Care – PPO | Source: Ambulatory Visit | Attending: Interventional Radiology | Admitting: Interventional Radiology

## 2021-06-27 ENCOUNTER — Encounter: Payer: Self-pay | Admitting: *Deleted

## 2021-06-27 DIAGNOSIS — S34115A Complete lesion of L5 level of lumbar spinal cord, initial encounter: Secondary | ICD-10-CM

## 2021-06-27 DIAGNOSIS — G8929 Other chronic pain: Secondary | ICD-10-CM

## 2021-06-27 DIAGNOSIS — M545 Low back pain, unspecified: Secondary | ICD-10-CM

## 2021-06-27 HISTORY — PX: IR RADIOLOGIST EVAL & MGMT: IMG5224

## 2021-06-27 NOTE — Progress Notes (Signed)
Chief Complaint: Patient was seen in consultation today for low back pain at the request of Jacqulynn Cadet K  Referring Physician(s): Burney Gauze, MD  History of Present Illness: Vincent David is a 56 y.o. male with left renal cell carcinoma metastatic to bone.  He has osseous metastases to the left proximal femur, right iliac wing and right aspect of L5.  His disease was diagnosed in 2021 and he has been on immunotherapy since 09/29/19.  Currently on [place].  His bone lesions have been treated with XRT and he underwent prophylactic ORIF of the left proximal femoral lesion.    His disease burden has been relatively stable.  He presents today at the kind request of Dr. Marin Olp to discuss minimally invasive treatment of his primary left renal lesion.  There are reports of improvement in remote metastatic disease following treatment/debulking of the primary tumor.  This is thought to be due to removal of pro renal cell growth factors admitted by the primary tumor.   He underwent successful left renal cryoablation on 10/19/20.  The procedure was uneventful.  Similarly, his recovery was quite easy.  No hematuria, flank pain or abdominal pain.  He has back to full activity and working 2 days/week.     MRI 06/06/21 - Stable size and appearance of the previously treated mass in the mid left kidney. No evidence of recurrent or new metastatic disease.   He continues to experience low back pain as well as pain in his left hip and upper leg where he underwent prior intramedullary nail and transfemoral neck screw placement prophylactically to prevent pathologic fracture.  MRI 06/23/21 -  1. Secondary lesion involving the right side of the L5 vertebral body and pedicle, corresponding to lesion seen on prior FDG PET. No new lesion in the remainder of the lumbar spine. 2. Moderate right neural foraminal narrowing at L5-S1. 3. No high-grade spinal canal stenosis.  Vincent David continues to  have low back pain which is most significant when he is standing or sitting in the same position for too long.  He describes his pain as achy and somewhat radiating into the lower extremities slightly worse on the right than the left.  Past Medical History:  Diagnosis Date   Cancer [facility])    Cancer of kidney ([place])    Goals of care, counseling/discussion 08/28/2019   History of COVID-19 10/03/2020   Positive COVID swab test   Postictal headache    Seizure ([place])    Sleep apnea    Tinnitus     Past Surgical History:  Procedure Laterality Date   IR IMAGING GUIDED PORT INSERTION  09/28/2019   IR RADIOLOGIST EVAL & MGMT  08/30/2020   IR RADIOLOGIST EVAL & MGMT  12/01/2020   IR RADIOLOGIST EVAL & MGMT  06/12/2021   IR RADIOLOGIST EVAL & MGMT  06/27/2021   KNEE SURGERY Left    ORIF PELVIC FRACTURE Left 09/14/2019   Procedure: OPEN REDUCTION INTERNAL FIXATION (ORIF) LEFT HIP WITH AFFIXUS NAIL;  Surgeon: Frederik Pear, MD;  Location[facility];  Service: Orthopedics;  Laterality: Left;   RADIOLOGY WITH ANESTHESIA N/A 10/19/2020   Procedure: CT CYROABLATION WITH ANESTHESIA;  Surgeon: Criselda Peaches, MD;  Location: [facility];  Service: Radiology;  Laterality: N/A;   SHOULDER SURGERY Left     Allergies: Patient has no known allergies.  Medications: Prior to Admission medications   Medication Sig Start Date End Date Taking? Authorizing Provider  amLODipine (NORVASC) 5 MG tablet  TAKE 1 TABLET EVERY DAY 03/27/21   Volanda Napoleon, MD  aspirin EC 81 MG tablet Take 1 tablet (81 mg total) by mouth 2 (two) times daily. 09/14/19   Leighton Parody, PA-C  ibuprofen (ADVIL) 200 MG tablet Take 400 mg by mouth every 8 (eight) hours as needed (pain.).    [provider]  lamoTRIgine (LAMICTAL) 100 MG tablet TAKE 1 TABLET BY MOUTH TWICE A DAY 09/12/20   [name], NP  lamoTRIgine (LAMICTAL) 25 MG tablet Take 2 tablets (50 mg total) by mouth 2 (two) times daily. 05/30/21   [name], NP  oxyCODONE  (OXYCONTIN) 15 mg 12 hr tablet Take 1 tablet (15 mg total) by mouth every 12 (twelve) hours. 03/16/21   Volanda Napoleon, MD  Oxycodone HCl 10 MG TABS Take 1 tablet (10 mg total) by mouth every 4 (four) hours as needed. 05/23/21   Volanda Napoleon, MD     Family History  Problem Relation Age of Onset   Sleep apnea Mother    Diabetes Mother    High blood pressure Father    Cancer Maternal Uncle        type unknown "either pancreatic or kidney"    Social History   Socioeconomic History   Marital status: Married    Spouse name: Otila Kluver   Number of children: 3   Years of education: Not on file   Highest education level: Not on file  Occupational History    Employer: OTHER  Tobacco Use   Smoking status: Every Day    Packs/day: 0.25    Years: 15.00    Pack years: 3.75    Types: Cigarettes   Smokeless tobacco: Never   Tobacco comments:    "Not quite" 0.5 ppd  Vaping Use   Vaping Use: Never used  Substance and Sexual Activity   Alcohol use: Yes    Alcohol/week: 1.0 - 2.0 standard drink    Types: 1 - 2 Cans of beer per week   Drug use: Never   Sexual activity: Yes    Partners: Female  Other Topics Concern   Not on file  Social History Narrative   Caffeine: 2 cups/day   Social Determinants of Health   Financial Resource Strain: Not on file  Food Insecurity: Not on file  Transportation Needs: Not on file  Physical Activity: Not on file  Stress: Not on file  Social Connections: Not on file    ECOG Status: 1 - Symptomatic but completely ambulatory  Review of Systems: A 12 point ROS discussed and pertinent positives are indicated in the HPI above.  All other systems are negative.  Review of Systems  Vital Signs: BP (!) 145/69 (BP Location: Left Arm)   Pulse 85   SpO2 96%   Physical Exam Constitutional:      General: He is not in acute distress.    Appearance: Normal appearance.  HENT:     Head: Normocephalic and atraumatic.  Eyes:     General: No scleral  icterus. Cardiovascular:     Rate and Rhythm: Normal rate.  Pulmonary:     Effort: Pulmonary effort is normal.  Abdominal:     General: Abdomen is flat. There is no distension.     Palpations: Abdomen is soft.     Tenderness: There is no abdominal tenderness.  Skin:    General: Skin is warm and dry.  Neurological:     Mental Status: He is alert and oriented to person,  place, and time.  Psychiatric:        Mood and Affect: Mood normal.        Behavior: Behavior normal.      Imaging: MR Lumbar Spine W Wo Contrast  Result Date: 06/23/2021 CLINICAL DATA:  Complete lesion of L5 level of lumbar spinal cord, initial encounter ([place]) S34.115A (ICD-10-CM). Chronic low back pain with bilateral sciatica, unspecified back pain laterality M54.41, G89.29, M54.42 (ICD-10-CM). EXAM: MRI LUMBAR SPINE WITHOUT AND WITH CONTRAST TECHNIQUE: Multiplanar and multiecho pulse sequences of the lumbar spine were obtained without and with intravenous contrast. CONTRAST:  55m GADAVIST GADOBUTROL 1 MMOL/ML IV SOLN COMPARISON:  FDG PET-CT April 26, 2021. FINDINGS: Segmentation:  Standard. Alignment:  Trace stepwise retrolisthesis from L2 through L5. Vertebrae: Heterogeneous lesion within the right side of the L5 vertebral body extending into the right pedicle, predominantly hypointense on T1 and T2, without significant contrast enhancement with discontinuity of the superior and inferior endplates and small extension into the L4-5 disc space, corresponding to lytic lesion seen on prior FDG PET. No retropulsion. Small heterogeneous area of contrast enhancement in the posterior aspect of the L5 vertebral body on the left, may also be related to malignancy. No suspicious lesion identified in the remainder of the visualized. Partially visualized lytic lesion in the right iliac bone. No acute fracture or evidence of discitis. Conus medullaris and cauda equina: Conus extends to the L1 level. Conus and cauda equina appear normal.  Paraspinal and other soft tissues: Bilateral renal cyst. Partially visualized treated left renal lesion. Disc levels: T12-L1: No spinal canal or neural foraminal stenosis. L1-2: No spinal canal or neural foraminal stenosis. L2-3: Disc bulge, mildly asymmetric to the right, mild facet degenerative changes with trace bilateral joint effusion. Mild bilateral neural foraminal narrowing. No significant spinal canal stenosis. L3-4: Disc bulge and mild facet degenerative changes resulting in minimal bilateral neural foraminal narrowing. No spinal canal stenosis. L4-5: Disc bulge and mild facet degenerative changes resulting in minimal bilateral neural foraminal narrowing. No significant spinal canal stenosis. L5-S1: Disc bulge and mild facet degenerative changes. Moderate right and mild left neural foraminal narrowing. IMPRESSION: 1. Secondary lesion involving the right side of the L5 vertebral body and pedicle, corresponding to lesion seen on prior FDG PET. No new lesion in the remainder of the lumbar spine. 2. Moderate right neural foraminal narrowing at L5-S1. 3. No high-grade spinal canal stenosis. Electronically Signed   By: KPedro EarlsM.D.   On: 06/23/2021 09:09   MR ABDOMEN WWO CONTRAST  Result Date: 06/06/2021 CLINICAL DATA:  History of RCC follow-up EXAM: MRI ABDOMEN WITHOUT AND WITH CONTRAST TECHNIQUE: Multiplanar multisequence MR imaging of the abdomen was performed both before and after the administration of intravenous contrast. CONTRAST:  994mGADAVIST GADOBUTROL 1 MMOL/ML IV SOLN COMPARISON:  MRI abdomen 07/26/2020 FINDINGS: Lower chest: No acute findings. Hepatobiliary: Liver is enlarged measuring 20.9 cm in length. No suspicious hepatic mass identified. Subcentimeter cyst in the inferior right hepatic lobe. Gallbladder appears normal. No biliary ductal dilatation. Pancreas: No mass, inflammatory changes, or other parenchymal abnormality identified. Spleen:  Within normal limits in size  and appearance. Adrenals/Urinary Tract: Adrenal glands appear normal. Several renal cysts identified bilaterally left greater than right, measuring up to 4.4 cm in the lateral left kidney and 3.5 cm in the medial left kidney. Similar size and appearance of the previously treated cystic and solid mass in the mid left kidney measuring up to 4 cm in size, with no new  or suspicious enhancement appreciated. No hydronephrosis bilaterally. Stomach/Bowel: Visualized portions within the abdomen are unremarkable. Vascular/Lymphatic: No pathologically enlarged lymph nodes identified. No abdominal aortic aneurysm demonstrated. Renal veins are patent. Other:  No ascites. Musculoskeletal: Known previous metastatic lesion is again partially seen in the L5 vertebral body. IMPRESSION: 1. Stable size and appearance of the previously treated mass in the mid left kidney. No evidence of recurrent or new metastatic disease. 2. Bilateral renal cysts. 3. Partially visualized previous known L5 metastasis. 4. Hepatomegaly. Electronically Signed   By: Ofilia Neas M.D.   On: 06/06/2021 08:46   IR Radiologist Eval & Mgmt  Result Date: 06/27/2021 Please refer to notes tab for details about interventional procedure. (Op Note)  IR Radiologist Eval & Mgmt  Result Date: 06/12/2021 Please refer to notes tab for details about interventional procedure. (Op Note)   Labs:  CBC: Recent Labs    02/02/21 0838 03/16/21 0837 04/28/21 0845 06/09/21 0815  WBC 6.6 7.2 6.8 8.5  HGB 14.5 14.9 14.2 14.9  HCT 42.8 43.8 42.0 43.1  PLT 215 211 228 228    COAGS: Recent Labs    10/19/20 1014  INR 1.0    BMP: Recent Labs    02/02/21 0838 03/16/21 0837 04/28/21 0845 06/09/21 0815  NA 135 137 134* 135  K 4.0 4.0 4.1 4.1  CL 102 104 104 103  CO2 '27 26 25 25  '$ GLUCOSE 123* 138* 100* 109*  BUN '17 17 15 '$ 23*  CALCIUM 8.5* 9.2 8.9 9.4  CREATININE 0.99 0.94 0.98 1.04  GFRNONAA >60 >60 >60 >60    LIVER FUNCTION TESTS: Recent  Labs    02/02/21 0838 03/16/21 0837 04/28/21 0845 06/09/21 0815  BILITOT 0.5 0.3 0.6 0.5  AST '15 16 16 '$ 14*  ALT '16 17 15 13  '$ ALKPHOS 91 108 90 91  PROT 6.4* 6.3* 6.8 6.8  ALBUMIN 4.1 4.2 4.4 4.4    TUMOR MARKERS: No results for input(s): AFPTM, CEA, CA199, CHROMGRNA in the last 8760 hours.  Assessment and Plan:  Very pleasant 56 year old gentleman with kidney cancer metastatic to the bones.  Current MRI and PET CT imaging demonstrates no evidence of active disease.  Unfortunately, he continues to be plagued by pain both in his left lower extremity at the site of prior femoral ORIF as well as in his low back with mild right greater than left lower extremity radiculopathy.  An MRI of the lumbar spine demonstrates no evidence of acute fracture.  The lesion in the right aspect of his L5 vertebral body is again visualized.  There are no new lesions.  He does have mechanical degenerative spondylosis with moderate right foraminal narrowing at L5-S1.  I believe this may be contributing to his low back pain.  I think he would be an excellent candidate for epidural steroid injection as a minimally invasive initial therapy.  1.)  Please schedule for right L5-S1 epidural steroid injection, series of 3 injections.   2.)  Follow-up televisit approximately 2 weeks after the first injection.    Electronically Signed: Criselda Peaches 06/27/2021, 10:55 AM   I spent a total of  15 Minutes in face to face in clinical consultation, greater than 50% of which was counseling/coordinating care for renal cancer with osseous metastatic disease and chronic low back pain with right foraminal stenosis.

## 2021-06-28 ENCOUNTER — Other Ambulatory Visit: Payer: Self-pay

## 2021-07-06 ENCOUNTER — Ambulatory Visit: Payer: BC Managed Care – PPO | Admitting: Adult Health

## 2021-07-11 ENCOUNTER — Other Ambulatory Visit (INDEPENDENT_AMBULATORY_CARE_PROVIDER_SITE_OTHER): Payer: Self-pay

## 2021-07-11 DIAGNOSIS — Z0289 Encounter for other administrative examinations: Secondary | ICD-10-CM

## 2021-07-11 DIAGNOSIS — Z5181 Encounter for therapeutic drug level monitoring: Secondary | ICD-10-CM

## 2021-07-13 ENCOUNTER — Telehealth: Payer: Self-pay | Admitting: Adult Health

## 2021-07-13 LAB — LAMOTRIGINE LEVEL: Lamotrigine Lvl: 5.2 ug/mL (ref 2.0–20.0)

## 2021-07-13 MED ORDER — LAMOTRIGINE 25 MG PO TABS: 75.0000 mg | ORAL_TABLET | Freq: Two times a day (BID) | ORAL | 1 refills | Status: DC

## 2021-07-13 NOTE — Telephone Encounter (Signed)
I called wife, but noticed that he was on lamotrigine '150mg'$  po bid already.   I relayed will ask MM/NP about dose increase and get back with her.  Spoke with MM/NP and she stated going to '175mg'$  po bid.  LMVM for wife and will make change (sent to pharmacy).  She is to call back if questions.

## 2021-07-13 NOTE — Telephone Encounter (Signed)
Spoke to wife of pt.  He had seizures last night after going to bed (70mn so after).  Lasted about 1-2 min.  Bit tongue, 2 blood vessels popped (black/blue today). Sore.  No urinary incontinence.  Today post ictal.  Normal recovery for him.  Trigger unknown. Compliant on lamictal '125mg'$  po bid.  Not sleep deprived, 2 beers over last week. Nothing new medically (no new meds).  Had lamictal level 07-11-2021 was 5.2.  MM/NP will be made aware and then let her know next plan.

## 2021-07-13 NOTE — Telephone Encounter (Signed)
Wife states pt had a seizure last night she noticed 2 big  blood vessels on pt's back last night, this morning they are black and blue.  Pt feeling ok but is sore, wife asking for a call to discuss.

## 2021-07-13 NOTE — Telephone Encounter (Signed)
Increase Lamictal to 150 mg BID.

## 2021-07-14 ENCOUNTER — Other Ambulatory Visit: Payer: Self-pay | Admitting: *Deleted

## 2021-07-14 DIAGNOSIS — G4739 Other sleep apnea: Secondary | ICD-10-CM

## 2021-07-14 DIAGNOSIS — C642 Malignant neoplasm of left kidney, except renal pelvis: Secondary | ICD-10-CM

## 2021-07-14 DIAGNOSIS — S72001G Fracture of unspecified part of neck of right femur, subsequent encounter for closed fracture with delayed healing: Secondary | ICD-10-CM

## 2021-07-14 DIAGNOSIS — Z9189 Other specified personal risk factors, not elsewhere classified: Secondary | ICD-10-CM

## 2021-07-17 NOTE — Telephone Encounter (Signed)
Recheck level in 1 month

## 2021-07-18 ENCOUNTER — Other Ambulatory Visit: Payer: Self-pay | Admitting: *Deleted

## 2021-07-18 DIAGNOSIS — Z5181 Encounter for therapeutic drug level monitoring: Secondary | ICD-10-CM

## 2021-07-18 DIAGNOSIS — R569 Unspecified convulsions: Secondary | ICD-10-CM

## 2021-07-18 NOTE — Telephone Encounter (Signed)
LMVM for wife that checking on husband (pt).  Megan NP wanted to recheck lamotrigine in one month.  Order in.  She is to call back if questions.

## 2021-07-19 NOTE — Telephone Encounter (Signed)
Spoke to wife of pt.  Took about 3 days to get back to baseline (post ictal lethargy/soreness). He has been doing ok with dose increase.  Will come in 4 wks for level (trough), no Fridays.   She verbalized understanding.

## 2021-07-21 ENCOUNTER — Other Ambulatory Visit: Payer: Self-pay | Admitting: Lab

## 2021-07-21 ENCOUNTER — Inpatient Hospital Stay: Payer: BC Managed Care – PPO

## 2021-07-21 ENCOUNTER — Other Ambulatory Visit: Payer: Self-pay

## 2021-07-21 ENCOUNTER — Encounter: Payer: Self-pay | Admitting: Hematology & Oncology

## 2021-07-21 ENCOUNTER — Inpatient Hospital Stay: Payer: BC Managed Care – PPO | Attending: Hematology & Oncology

## 2021-07-21 ENCOUNTER — Inpatient Hospital Stay (HOSPITAL_BASED_OUTPATIENT_CLINIC_OR_DEPARTMENT_OTHER): Payer: BC Managed Care – PPO | Admitting: Hematology & Oncology

## 2021-07-21 VITALS — BP 148/84 | HR 79 | Temp 98.1°F | Resp 17

## 2021-07-21 VITALS — BP 152/72 | HR 82 | Temp 98.4°F | Resp 18 | Wt 206.0 lb

## 2021-07-21 DIAGNOSIS — Z79899 Other long term (current) drug therapy: Secondary | ICD-10-CM | POA: Diagnosis not present

## 2021-07-21 DIAGNOSIS — Z5112 Encounter for antineoplastic immunotherapy: Secondary | ICD-10-CM | POA: Insufficient documentation

## 2021-07-21 DIAGNOSIS — G4739 Other sleep apnea: Secondary | ICD-10-CM

## 2021-07-21 DIAGNOSIS — C642 Malignant neoplasm of left kidney, except renal pelvis: Secondary | ICD-10-CM

## 2021-07-21 DIAGNOSIS — C7951 Secondary malignant neoplasm of bone: Secondary | ICD-10-CM | POA: Diagnosis not present

## 2021-07-21 DIAGNOSIS — S72001G Fracture of unspecified part of neck of right femur, subsequent encounter for closed fracture with delayed healing: Secondary | ICD-10-CM

## 2021-07-21 DIAGNOSIS — Z9189 Other specified personal risk factors, not elsewhere classified: Secondary | ICD-10-CM

## 2021-07-21 LAB — CBC WITH DIFFERENTIAL (CANCER CENTER ONLY)
Abs Immature Granulocytes: 0.13 10*3/uL — ABNORMAL HIGH (ref 0.00–0.07)
Basophils Absolute: 0 10*3/uL (ref 0.0–0.1)
Basophils Relative: 1 %
Eosinophils Absolute: 0.3 10*3/uL (ref 0.0–0.5)
Eosinophils Relative: 5 %
HCT: 44.7 % (ref 39.0–52.0)
Hemoglobin: 14.7 g/dL (ref 13.0–17.0)
Immature Granulocytes: 2 %
Lymphocytes Relative: 15 %
Lymphs Abs: 1 10*3/uL (ref 0.7–4.0)
MCH: 30.4 pg (ref 26.0–34.0)
MCHC: 32.9 g/dL (ref 30.0–36.0)
MCV: 92.5 fL (ref 80.0–100.0)
Monocytes Absolute: 0.4 10*3/uL (ref 0.1–1.0)
Monocytes Relative: 7 %
Neutro Abs: 4.7 10*3/uL (ref 1.7–7.7)
Neutrophils Relative %: 70 %
Platelet Count: 215 10*3/uL (ref 150–400)
RBC: 4.83 MIL/uL (ref 4.22–5.81)
RDW: 13.6 % (ref 11.5–15.5)
WBC Count: 6.7 10*3/uL (ref 4.0–10.5)
nRBC: 0 % (ref 0.0–0.2)

## 2021-07-21 LAB — CMP (CANCER CENTER ONLY)
ALT: 13 U/L (ref 0–44)
AST: 14 U/L — ABNORMAL LOW (ref 15–41)
Albumin: 4.5 g/dL (ref 3.5–5.0)
Alkaline Phosphatase: 82 U/L (ref 38–126)
Anion gap: 7 (ref 5–15)
BUN: 22 mg/dL — ABNORMAL HIGH (ref 6–20)
CO2: 25 mmol/L (ref 22–32)
Calcium: 9 mg/dL (ref 8.9–10.3)
Chloride: 108 mmol/L (ref 98–111)
Creatinine: 0.71 mg/dL (ref 0.61–1.24)
GFR, Estimated: >60 mL/min (ref >60)
Glucose, Bld: 143 mg/dL — ABNORMAL HIGH (ref 70–99)
Potassium: 3.9 mmol/L (ref 3.5–5.1)
Sodium: 140 mmol/L (ref 135–145)
Total Bilirubin: 0.5 mg/dL (ref 0.3–1.2)
Total Protein: 6.9 g/dL (ref 6.5–8.1)

## 2021-07-21 LAB — LACTATE DEHYDROGENASE: LDH: 171 U/L (ref 98–192)

## 2021-07-21 LAB — TSH: TSH: 0.892 u[IU]/mL (ref 0.350–4.500)

## 2021-07-21 MED ORDER — HEPARIN SOD (PORK) LOCK FLUSH 100 UNIT/ML IV SOLN
500.0000 [IU] | Freq: Once | INTRAVENOUS | Status: AC | PRN
  Administered 2021-07-21: 500 [IU]

## 2021-07-21 MED ORDER — SODIUM CHLORIDE 0.9% FLUSH
10.0000 mL | INTRAVENOUS | Status: DC | PRN
  Administered 2021-07-21: 10 mL

## 2021-07-21 MED ORDER — SODIUM CHLORIDE 0.9 % IV SOLN
480.0000 mg | Freq: Once | INTRAVENOUS | Status: AC
  Administered 2021-07-21: 480 mg via INTRAVENOUS
  Filled 2021-07-21: qty 48

## 2021-07-21 MED ORDER — SODIUM CHLORIDE 0.9 % IV SOLN: Freq: Once | INTRAVENOUS | Status: AC

## 2021-07-21 NOTE — Patient Instructions (Signed)
Nivolumab injection What is this medication? NIVOLUMAB (nye VOL ue mab) is a monoclonal antibody. It treats certain types of cancer. Some of the cancers treated are colon cancer, head and neck cancer, Hodgkin lymphoma, lung cancer, and melanoma. This medicine may be used for other purposes; ask your health care provider or pharmacist if you have questions. COMMON BRAND NAME(S): Opdivo What should I tell my care team before I take this medication? They need to know if you have any of these conditions: Autoimmune diseases such as Crohn's disease, ulcerative colitis, or lupus Have had or planning to have an allogeneic stem cell transplant (uses someone else's stem cells) History of chest radiation Organ transplant Nervous system problems such as myasthenia gravis or Guillain-Barre syndrome An unusual or allergic reaction to nivolumab, other medicines, foods, dyes, or preservatives Pregnant or trying to get pregnant Breast-feeding How should I use this medication? This medication is injected into a vein. It is given in a hospital or clinic setting. A special MedGuide will be given to you before each treatment. Be sure to read this information carefully each time. Talk to your care team regarding the use of this medication in children. While it may be prescribed for children as young as 12 years for selected conditions, precautions do apply. Overdosage: If you think you have taken too much of this medicine contact a poison control center or emergency room at once. NOTE: This medicine is only for you. Do not share this medicine with others. What if I miss a dose? Keep appointments for follow-up doses. It is important not to miss your dose. Call your care team if you are unable to keep an appointment. What may interact with this medication? Interactions have not been studied. This list may not describe all possible interactions. Give your health care provider a list of all the medicines, herbs,  non-prescription drugs, or dietary supplements you use. Also tell them if you smoke, drink alcohol, or use illegal drugs. Some items may interact with your medicine. What should I watch for while using this medication? Your condition will be monitored carefully while you are receiving this medication. You may need blood work done while you are taking this medication. Do not become pregnant while taking this medication or for 5 months after stopping it. Women should inform their care team if they wish to become pregnant or think they might be pregnant. There is a potential for serious harm to an unborn child. Talk to your care team for more information. Do not breast-feed an infant while taking this medication or for 5 months after stopping it. What side effects may I notice from receiving this medication? Side effects that you should report to your care team as soon as possible: Allergic reactions--skin rash, itching, hives, swelling of the face, lips, tongue, or throat Bloody or black, tar-like stools Change in vision Chest pain Diarrhea Dry cough, shortness of breath or trouble breathing Eye pain Fast or irregular heartbeat Fever, chills High blood sugar (hyperglycemia)--increased thirst or amount of urine, unusual weakness or fatigue, blurry vision High thyroid levels (hyperthyroidism)--fast or irregular heartbeat, weight loss, excessive sweating or sensitivity to heat, tremors or shaking, anxiety, nervousness, irregular menstrual cycle or spotting Kidney injury--decrease in the amount of urine, swelling of the ankles, hands, or feet Liver injury--right upper belly pain, loss of appetite, nausea, light-colored stool, dark yellow or brown urine, yellowing skin or eyes, unusual weakness or fatigue Low red blood cell count--unusual weakness or fatigue, dizziness, headache, trouble breathing   Low thyroid levels (hypothyroidism)--unusual weakness or fatigue, increased sensitivity to cold,  constipation, hair loss, dry skin, weight gain, feelings of depression Mood and behavior changes-confusion, change in sex drive or performance, irritability Muscle pain or cramps Pain, tingling, or numbness in the hands or feet, muscle weakness, trouble walking, loss of balance or coordination Red or dark brown urine Redness, blistering, peeling, or loosening of the skin, including inside the mouth Stomach pain Unusual bruising or bleeding Side effects that usually do not require medical attention (report to your care team if they continue or are bothersome): Bone pain Constipation Loss of appetite Nausea Tiredness Vomiting This list may not describe all possible side effects. Call your doctor for medical advice about side effects. You may report side effects to FDA at 1-800-FDA-1088. Where should I keep my medication? This medication is given in a hospital or clinic and will not be stored at home. NOTE: This sheet is a summary. It may not cover all possible information. If you have questions about this medicine, talk to your doctor, pharmacist, or health care provider.  2023 Elsevier/Gold Standard (2020-12-23 00:00:00)  

## 2021-07-21 NOTE — Patient Instructions (Signed)

## 2021-07-21 NOTE — Progress Notes (Signed)
Hematology and Oncology Follow Up Visit  Sachit Gilman [id number] Dec 06, 1965 56 y.o. 07/21/2021   Principle Diagnosis:  Metastatic Renal Cell Carcinoma - bone mets  Current Therapy:   Nivoumab/Ipilimumab -- s/p cycle #3 -- started on 09/29/2019 -- maintenance Nivolumab q month -- Started on 12/29/2019 Xgeva 120 mg sq q 3 months -- next dose in 08/2021 XRT to right iliac mass/ L5 vertebral body/ Left femoral neck LEFT hip repair Cryoablation of left kidney mass-10/19/2020     Interim History:  Mr. Clevenger is back for follow-up. He looks quite good.  Comes in with his wife.  He feels okay.  He did have an MRI of the lumbar spine recently.  This did not show any change in the metastasis.  He had MRI of his abdomen early May.  This was to follow-up on the cryoablation that he had with the left kidney mass.  This also looked fine with no evidence of local recurrence.  He  does still have some back issues.  He does take the OxyContin as needed.  His appetite is doing quite well.  He is having no problems with cough or shortness of breath.  He and his wife will be going up to [place] for a couple weeks in early July.  They have family up there.  He has had no fever.  He did have a seizure since we last saw him.  This was not related to his renal cell carcinoma.  He is on Lamictal.  I think the Lamictal dose was increased.  Currently, his performance status is ECOG 0.    Medications:  Current Outpatient Medications:    amLODipine (NORVASC) 5 MG tablet, TAKE 1 TABLET EVERY DAY, Disp: 90 tablet, Rfl: 4   aspirin EC 81 MG tablet, Take 1 tablet (81 mg total) by mouth 2 (two) times daily., Disp: 60 tablet, Rfl: 0   ibuprofen (ADVIL) 200 MG tablet, Take 400 mg by mouth every 8 (eight) hours as needed (pain.)., Disp: , Rfl:    lamoTRIgine (LAMICTAL) 100 MG tablet, TAKE 1 TABLET BY MOUTH TWICE A DAY, Disp: 180 tablet, Rfl: 3   lamoTRIgine (LAMICTAL) 25 MG tablet, Take 3 tablets (75 mg total)  by mouth 2 (two) times daily., Disp: 360 tablet, Rfl: 1   oxyCODONE (OXYCONTIN) 15 mg 12 hr tablet, Take 1 tablet (15 mg total) by mouth every 12 (twelve) hours., Disp: 60 tablet, Rfl: 0   Oxycodone HCl 10 MG TABS, Take 1 tablet (10 mg total) by mouth every 4 (four) hours as needed., Disp: 90 tablet, Rfl: 0 No current facility-administered medications for this visit.  Facility-Administered Medications Ordered in Other Visits:    sodium chloride flush (NS) 0.9 % injection 10 mL, 10 mL, Intracatheter, PRN, Volanda Napoleon, MD, 10 mL at 05/19/20 1343  Allergies: No Known Allergies  Past Medical History, Surgical history, Social history, and Family History were reviewed and updated.  Review of Systems: Review of Systems  Constitutional: Negative.   HENT:  Negative.    Eyes: Negative.   Respiratory: Negative.    Cardiovascular: Negative.   Gastrointestinal: Negative.   Endocrine: Negative.   Musculoskeletal:  Positive for back pain and flank pain.  Skin: Negative.   Neurological: Negative.   Hematological: Negative.   Psychiatric/Behavioral: Negative.      Physical Exam:  weight is 206 lb (93.4 kg). His oral temperature is 98.4 F (36.9 C). His blood pressure is 152/72 (abnormal) and his pulse is 82. His respiration  is 18 and oxygen saturation is 99%.   Wt Readings from Last 3 Encounters:  07/21/21 206 lb (93.4 kg)  06/09/21 209 lb 1.3 oz (94.8 kg)  04/28/21 210 lb (95.3 kg)    Physical Exam Vitals reviewed.  HENT:     Head: Normocephalic and atraumatic.  Eyes:     Pupils: Pupils are equal, round, and reactive to light.  Cardiovascular:     Rate and Rhythm: Normal rate and regular rhythm.     Heart sounds: Normal heart sounds.  Pulmonary:     Effort: Pulmonary effort is normal.     Breath sounds: Normal breath sounds.  Abdominal:     General: Bowel sounds are normal.     Palpations: Abdomen is soft.  Musculoskeletal:        General: No tenderness or deformity. Normal  range of motion.     Cervical back: Normal range of motion.  Lymphadenopathy:     Cervical: No cervical adenopathy.  Skin:    General: Skin is warm and dry.     Findings: No erythema or rash.  Neurological:     Mental Status: He is alert and oriented to person, place, and time.  Psychiatric:        Behavior: Behavior normal.        Thought Content: Thought content normal.        Judgment: Judgment normal.     Lab Results  Component Value Date   WBC 6.7 07/21/2021   HGB 14.7 07/21/2021   HCT 44.7 07/21/2021   MCV 92.5 07/21/2021   PLT 215 07/21/2021     Chemistry      Component Value Date/Time   NA 135 06/09/2021 0815   K 4.1 06/09/2021 0815   CL 103 06/09/2021 0815   CO2 25 06/09/2021 0815   BUN 23 (H) 06/09/2021 0815   CREATININE 1.04 06/09/2021 0815   CREATININE 1.15 08/25/2019 1450      Component Value Date/Time   CALCIUM 9.4 06/09/2021 0815   ALKPHOS 91 06/09/2021 0815   AST 14 (L) 06/09/2021 0815   ALT 13 06/09/2021 0815   BILITOT 0.5 06/09/2021 0815      Impression and Plan: Mr. Fouche is a very nice 56 year old white male.  He has metastatic renal cell carcinoma.  For some reason, his disease clearly favors his bones.  His lungs and liver and lymph nodes all appear to be clean.  So far, the immunotherapy really has worked nicely.  We will set him up with another PET scan before we see him back in late July.  I think this would be helpful.  I think his last PET scan was probably in March or April.  I do not see any problems with him going up to [place] for vacation.  I told him to make sure he hydrates and wears sunscreen.  We will plan to get him back in late July.    6/16/20239:19 AM

## 2021-07-26 ENCOUNTER — Other Ambulatory Visit: Payer: Self-pay | Admitting: Hematology & Oncology

## 2021-07-26 MED ORDER — OXYCODONE HCL ER 15 MG PO T12A: 15.0000 mg | EXTENDED_RELEASE_TABLET | Freq: Two times a day (BID) | ORAL | 0 refills | Status: DC

## 2021-07-26 MED ORDER — OXYCODONE HCL 10 MG PO TABS: 10.0000 mg | ORAL_TABLET | ORAL | 0 refills | Status: DC | PRN

## 2021-07-27 ENCOUNTER — Encounter: Payer: Self-pay | Admitting: Hematology & Oncology

## 2021-08-03 ENCOUNTER — Other Ambulatory Visit: Payer: Self-pay | Admitting: *Deleted

## 2021-08-03 MED ORDER — OXYCODONE HCL ER 15 MG PO T12A: 15.0000 mg | EXTENDED_RELEASE_TABLET | Freq: Two times a day (BID) | ORAL | 0 refills | Status: DC

## 2021-08-21 ENCOUNTER — Encounter: Payer: Self-pay | Admitting: Hematology & Oncology

## 2021-08-28 ENCOUNTER — Other Ambulatory Visit: Payer: Self-pay

## 2021-08-28 ENCOUNTER — Other Ambulatory Visit: Payer: Self-pay | Admitting: Adult Health

## 2021-08-30 ENCOUNTER — Encounter (HOSPITAL_COMMUNITY)
Admission: RE | Admit: 2021-08-30 | Discharge: 2021-08-30 | Disposition: A | Discharge status: Home / Self Care | Payer: PRIVATE HEALTH INSURANCE | Source: Ambulatory Visit | Attending: Hematology & Oncology | Admitting: Hematology & Oncology

## 2021-08-30 DIAGNOSIS — C642 Malignant neoplasm of left kidney, except renal pelvis: Secondary | ICD-10-CM | POA: Diagnosis not present

## 2021-08-30 LAB — GLUCOSE, CAPILLARY: Glucose-Capillary: 114 mg/dL — ABNORMAL HIGH (ref 70–99)

## 2021-08-30 MED ORDER — FLUDEOXYGLUCOSE F - 18 (FDG) INJECTION
10.3000 | Freq: Once | INTRAVENOUS | Status: AC | PRN
  Administered 2021-08-30: 10.28 via INTRAVENOUS

## 2021-08-31 ENCOUNTER — Encounter: Payer: Self-pay | Admitting: *Deleted

## 2021-09-01 ENCOUNTER — Encounter: Payer: Self-pay | Admitting: Hematology & Oncology

## 2021-09-01 ENCOUNTER — Inpatient Hospital Stay: Payer: BC Managed Care – PPO

## 2021-09-01 ENCOUNTER — Other Ambulatory Visit: Payer: Self-pay

## 2021-09-01 ENCOUNTER — Inpatient Hospital Stay: Payer: BC Managed Care – PPO | Attending: Hematology & Oncology

## 2021-09-01 ENCOUNTER — Inpatient Hospital Stay (HOSPITAL_BASED_OUTPATIENT_CLINIC_OR_DEPARTMENT_OTHER): Payer: BC Managed Care – PPO | Admitting: Hematology & Oncology

## 2021-09-01 VITALS — BP 127/67 | HR 74 | Temp 97.8°F | Resp 17 | Wt 202.0 lb

## 2021-09-01 VITALS — BP 138/66 | HR 70

## 2021-09-01 DIAGNOSIS — C642 Malignant neoplasm of left kidney, except renal pelvis: Secondary | ICD-10-CM | POA: Insufficient documentation

## 2021-09-01 DIAGNOSIS — Z5112 Encounter for antineoplastic immunotherapy: Secondary | ICD-10-CM | POA: Diagnosis not present

## 2021-09-01 DIAGNOSIS — Z79899 Other long term (current) drug therapy: Secondary | ICD-10-CM | POA: Diagnosis not present

## 2021-09-01 DIAGNOSIS — R200 Anesthesia of skin: Secondary | ICD-10-CM | POA: Diagnosis not present

## 2021-09-01 DIAGNOSIS — Z7982 Long term (current) use of aspirin: Secondary | ICD-10-CM | POA: Insufficient documentation

## 2021-09-01 DIAGNOSIS — C7951 Secondary malignant neoplasm of bone: Secondary | ICD-10-CM | POA: Diagnosis not present

## 2021-09-01 LAB — CBC WITH DIFFERENTIAL (CANCER CENTER ONLY)
Abs Immature Granulocytes: 0.06 10*3/uL (ref 0.00–0.07)
Basophils Absolute: 0 10*3/uL (ref 0.0–0.1)
Basophils Relative: 1 %
Eosinophils Absolute: 0.4 10*3/uL (ref 0.0–0.5)
Eosinophils Relative: 5 %
HCT: 43.2 % (ref 39.0–52.0)
Hemoglobin: 14.5 g/dL (ref 13.0–17.0)
Immature Granulocytes: 1 %
Lymphocytes Relative: 14 %
Lymphs Abs: 1 10*3/uL (ref 0.7–4.0)
MCH: 30.9 pg (ref 26.0–34.0)
MCHC: 33.6 g/dL (ref 30.0–36.0)
MCV: 92.1 fL (ref 80.0–100.0)
Monocytes Absolute: 0.4 10*3/uL (ref 0.1–1.0)
Monocytes Relative: 6 %
Neutro Abs: 5.6 10*3/uL (ref 1.7–7.7)
Neutrophils Relative %: 73 %
Platelet Count: 192 10*3/uL (ref 150–400)
RBC: 4.69 MIL/uL (ref 4.22–5.81)
RDW: 13.8 % (ref 11.5–15.5)
WBC Count: 7.5 10*3/uL (ref 4.0–10.5)
nRBC: 0 % (ref 0.0–0.2)

## 2021-09-01 LAB — CMP (CANCER CENTER ONLY)
ALT: 14 U/L (ref 0–44)
AST: 14 U/L — ABNORMAL LOW (ref 15–41)
Albumin: 4.4 g/dL (ref 3.5–5.0)
Alkaline Phosphatase: 100 U/L (ref 38–126)
Anion gap: 5 (ref 5–15)
BUN: 18 mg/dL (ref 6–20)
CO2: 27 mmol/L (ref 22–32)
Calcium: 8.8 mg/dL — ABNORMAL LOW (ref 8.9–10.3)
Chloride: 105 mmol/L (ref 98–111)
Creatinine: 1.14 mg/dL (ref 0.61–1.24)
GFR, Estimated: >60 mL/min (ref >60)
Glucose, Bld: 129 mg/dL — ABNORMAL HIGH (ref 70–99)
Potassium: 4.1 mmol/L (ref 3.5–5.1)
Sodium: 137 mmol/L (ref 135–145)
Total Bilirubin: 0.4 mg/dL (ref 0.3–1.2)
Total Protein: 6.4 g/dL — ABNORMAL LOW (ref 6.5–8.1)

## 2021-09-01 LAB — LACTATE DEHYDROGENASE: LDH: 147 U/L (ref 98–192)

## 2021-09-01 MED ORDER — SODIUM CHLORIDE 0.9% FLUSH
10.0000 mL | INTRAVENOUS | Status: DC | PRN
  Administered 2021-09-01: 10 mL

## 2021-09-01 MED ORDER — SODIUM CHLORIDE 0.9 % IV SOLN
480.0000 mg | Freq: Once | INTRAVENOUS | Status: AC
  Administered 2021-09-01: 480 mg via INTRAVENOUS
  Filled 2021-09-01: qty 48

## 2021-09-01 MED ORDER — SODIUM CHLORIDE 0.9 % IV SOLN: Freq: Once | INTRAVENOUS | Status: AC

## 2021-09-01 MED ORDER — HEPARIN SOD (PORK) LOCK FLUSH 100 UNIT/ML IV SOLN
500.0000 [IU] | Freq: Once | INTRAVENOUS | Status: AC | PRN
  Administered 2021-09-01: 500 [IU]

## 2021-09-01 MED ORDER — BELSOMRA 10 MG PO TABS: 10.0000 mg | ORAL_TABLET | Freq: Every day | ORAL | 2 refills | Status: DC

## 2021-09-01 NOTE — Patient Instructions (Signed)
[facility]  Discharge Instructions: Thank you for choosing [facility] to provide your oncology and hematology care.   If you have a lab appointment with the Cancer Center, please go directly to the Cancer Center and check in at the registration area.  Wear comfortable clothing and clothing appropriate for easy access to any Portacath or PICC line.   We strive to give you quality time with your provider. You may need to reschedule your appointment if you arrive late (15 or more minutes).  Arriving late affects you and other patients whose appointments are after yours.  Also, if you miss three or more appointments without notifying the office, you may be dismissed from the clinic at the provider's discretion.      For prescription refill requests, have your pharmacy contact our office and allow 72 hours for refills to be completed.    Today you received the following chemotherapy and/or immunotherapy agents:  Opdivo      To help prevent nausea and vomiting after your treatment, we encourage you to take your nausea medication as directed.  BELOW ARE SYMPTOMS THAT SHOULD BE REPORTED IMMEDIATELY: *FEVER GREATER THAN 100.4 F (38 C) OR HIGHER *CHILLS OR SWEATING *NAUSEA AND VOMITING THAT IS NOT CONTROLLED WITH YOUR NAUSEA MEDICATION *UNUSUAL SHORTNESS OF BREATH *UNUSUAL BRUISING OR BLEEDING *URINARY PROBLEMS (pain or burning when urinating, or frequent urination) *BOWEL PROBLEMS (unusual diarrhea, constipation, pain near the anus) TENDERNESS IN MOUTH AND THROAT WITH OR WITHOUT PRESENCE OF ULCERS (sore throat, sores in mouth, or a toothache) UNUSUAL RASH, SWELLING OR PAIN  UNUSUAL VAGINAL DISCHARGE OR ITCHING   Items with * indicate a potential emergency and should be followed up as soon as possible or go to the Emergency Department if any problems should occur.  Please show the CHEMOTHERAPY ALERT CARD or IMMUNOTHERAPY ALERT CARD at check-in to the  Emergency Department and triage nurse. Should you have questions after your visit or need to cancel or reschedule your appointment, please contact [facility]  [phone] and follow the prompts.  Office hours are 8:00 a.m. to 4:30 p.m. Monday - Friday. Please note that voicemails left after 4:00 p.m. may not be returned until the following business day.  We are closed weekends and major holidays. You have access to a nurse at all times for urgent questions. Please call the main number to the clinic [phone] and follow the prompts.  For any non-urgent questions, you may also contact your provider using MyChart. We now offer e-Visits for anyone 18 and older to request care online for non-urgent symptoms. For details visit [email or web address].   Also download the MyChart app! Go to the app store, search "MyChart", open the app, select [facility], and log in with your MyChart username and password.  Due to Covid, a mask is required upon entering the hospital/clinic. If you do not have a mask, one will be given to you upon arrival. For doctor visits, patients may have 1 support person aged 18 or older with them. For treatment visits, patients cannot have anyone with them due to current Covid guidelines and our immunocompromised population.  

## 2021-09-01 NOTE — Patient Instructions (Signed)

## 2021-09-01 NOTE — Progress Notes (Signed)
Per Dr Marin Olp, hold Vincent David today d/t low Ca. dph

## 2021-09-01 NOTE — Progress Notes (Signed)
Hematology and Oncology Follow Up Visit  Vincent David [id number] [birth year] 56 y.o. 09/01/2021   Principle Diagnosis:  Metastatic Renal Cell Carcinoma - bone mets  Current Therapy:   Nivoumab/Ipilimumab -- s/p cycle #3 -- started on 09/29/2019 -- maintenance Nivolumab q month -- Started on 12/29/2019 Xgeva 120 mg sq q 3 months -- next dose in 10/2021 XRT to right iliac mass/ L5 vertebral body/ Left femoral neck LEFT hip repair Cryoablation of left kidney mass-10/19/2020     Interim History:  Vincent David is back for follow-up.  He has been doing pretty well.  He and his wife had a nice vacation up in [place].  There was go up there in the summer.  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  We did do a PET scan on him.  This was done last week.  PET scan did not show any evidence of progressive disease.  He had stable changes in the bones.  Where he had cryotherapy in the left kidney, this area was photopenic.  He still is having some hip issues.  This I think he will always have just  because of involvement by the malignancy.  He has some numbness in the right leg.  We have done an MRI recently on the lower back.  Again there is no evidence of nerve compression.  He has had no problems with bowels or bladder.  He is not sleeping well.  I will try him on some Belsomra for this.  He has had no cough or shortness of breath.  There is been no headache.  Overall, I was his performance status is probably ECOG 1.   Medications:  Current Outpatient Medications:    amLODipine (NORVASC) 5 MG tablet, TAKE 1 TABLET EVERY DAY, Disp: 90 tablet, Rfl: 4   aspirin EC 81 MG tablet, Take 1 tablet (81 mg total) by mouth 2 (two) times daily., Disp: 60 tablet, Rfl: 0   ibuprofen (ADVIL) 200 MG tablet, Take 400 mg by mouth every 8 (eight) hours as needed (pain.)., Disp: , Rfl:    lamoTRIgine (LAMICTAL) 100 MG tablet, TAKE 1 TABLET BY MOUTH TWICE A DAY, Disp: 180  tablet, Rfl: 3   lamoTRIgine (LAMICTAL) 25 MG tablet, Take 3 tablets (75 mg total) by mouth 2 (two) times daily., Disp: 360 tablet, Rfl: 1   oxyCODONE (OXYCONTIN) 15 mg 12 hr tablet, Take 1 tablet (15 mg total) by mouth every 12 (twelve) hours., Disp: 34 tablet, Rfl: 0   Oxycodone HCl 10 MG TABS, Take 1 tablet (10 mg total) by mouth every 4 (four) hours as needed., Disp: 90 tablet, Rfl: 0 No current facility-administered medications for this visit.  Facility-Administered Medications Ordered in Other Visits:    sodium chloride flush (NS) 0.9 % injection 10 mL, 10 mL, Intracatheter, PRN, Volanda Napoleon, MD, 10 mL at 05/19/20 1343  Allergies: No Known Allergies  Past Medical History, Surgical history, Social history, and Family History were reviewed and updated.  Review of Systems: Review of Systems  Constitutional: Negative.   HENT:  Negative.    Eyes: Negative.   Respiratory: Negative.    Cardiovascular: Negative.   Gastrointestinal: Negative.   Endocrine: Negative.   Musculoskeletal:  Positive for back pain and flank pain.  Skin: Negative.   Neurological: Negative.   Hematological: Negative.   Psychiatric/Behavioral: Negative.      Physical Exam:  weight is 202 lb (91.6 kg). His oral temperature is  97.8 F (36.6 C). His blood pressure is 127/67 and his pulse is 74. His respiration is 17 and oxygen saturation is 100%.   Wt Readings from Last 3 Encounters:  09/01/21 202 lb (91.6 kg)  07/21/21 206 lb (93.4 kg)  06/09/21 209 lb 1.3 oz (94.8 kg)    Physical Exam Vitals reviewed.  HENT:     Head: Normocephalic and atraumatic.  Eyes:     Pupils: Pupils are equal, round, and reactive to light.  Cardiovascular:     Rate and Rhythm: Normal rate and regular rhythm.     Heart sounds: Normal heart sounds.  Pulmonary:     Effort: Pulmonary effort is normal.     Breath sounds: Normal breath sounds.  Abdominal:     General: Bowel sounds are normal.     Palpations: Abdomen is  soft.  Musculoskeletal:        General: No tenderness or deformity. Normal range of motion.     Cervical back: Normal range of motion.  Lymphadenopathy:     Cervical: No cervical adenopathy.  Skin:    General: Skin is warm and dry.     Findings: No erythema or rash.  Neurological:     Mental Status: He is alert and oriented to person, place, and time.  Psychiatric:        Behavior: Behavior normal.        Thought Content: Thought content normal.        Judgment: Judgment normal.      Lab Results  Component Value Date   WBC 7.5 09/01/2021   HGB 14.5 09/01/2021   HCT 43.2 09/01/2021   MCV 92.1 09/01/2021   PLT 192 09/01/2021     Chemistry      Component Value Date/Time   NA 137 09/01/2021 0803   K 4.1 09/01/2021 0803   CL 105 09/01/2021 0803   CO2 27 09/01/2021 0803   BUN 18 09/01/2021 0803   CREATININE 1.14 09/01/2021 0803   CREATININE 1.15 08/25/2019 1450      Component Value Date/Time   CALCIUM 8.8 (L) 09/01/2021 0803   ALKPHOS 100 09/01/2021 0803   AST 14 (L) 09/01/2021 0803   ALT 14 09/01/2021 0803   BILITOT 0.4 09/01/2021 0803      Impression and Plan: Vincent David is a very nice 56 year old white male.  He has metastatic renal cell carcinoma.  For some reason, his disease clearly favors his bones.  His lungs and liver and lymph nodes all appear to be clean.  So far, the immunotherapy really has worked nicely.  We will go ahead with the nivolumab.  His calcium is little bit too low today to give him Xgeva.  We will have to do this when we see him back.  Hopefully, the [facility] will help him.  I do not think we have to do another PET scan on him probably until November or December.    7/28/20239:29 AM

## 2021-09-21 ENCOUNTER — Other Ambulatory Visit: Payer: Self-pay | Admitting: Transplant Surgery

## 2021-09-21 ENCOUNTER — Other Ambulatory Visit: Payer: Self-pay | Admitting: Interventional Radiology

## 2021-09-21 DIAGNOSIS — C642 Malignant neoplasm of left kidney, except renal pelvis: Secondary | ICD-10-CM

## 2021-09-22 ENCOUNTER — Other Ambulatory Visit: Payer: Self-pay

## 2021-10-13 ENCOUNTER — Inpatient Hospital Stay: Payer: BC Managed Care – PPO

## 2021-10-13 ENCOUNTER — Other Ambulatory Visit (HOSPITAL_BASED_OUTPATIENT_CLINIC_OR_DEPARTMENT_OTHER): Payer: Self-pay

## 2021-10-13 ENCOUNTER — Inpatient Hospital Stay: Payer: BC Managed Care – PPO | Attending: Hematology & Oncology

## 2021-10-13 ENCOUNTER — Other Ambulatory Visit: Payer: Self-pay

## 2021-10-13 ENCOUNTER — Encounter: Payer: Self-pay | Admitting: Hematology & Oncology

## 2021-10-13 ENCOUNTER — Inpatient Hospital Stay (HOSPITAL_BASED_OUTPATIENT_CLINIC_OR_DEPARTMENT_OTHER): Payer: BC Managed Care – PPO | Admitting: Hematology & Oncology

## 2021-10-13 VITALS — BP 132/61 | HR 75 | Temp 98.9°F | Resp 18 | Ht 67.0 in | Wt 208.0 lb

## 2021-10-13 DIAGNOSIS — Z79899 Other long term (current) drug therapy: Secondary | ICD-10-CM | POA: Diagnosis not present

## 2021-10-13 DIAGNOSIS — C642 Malignant neoplasm of left kidney, except renal pelvis: Secondary | ICD-10-CM | POA: Diagnosis not present

## 2021-10-13 DIAGNOSIS — Z5112 Encounter for antineoplastic immunotherapy: Secondary | ICD-10-CM | POA: Diagnosis not present

## 2021-10-13 DIAGNOSIS — Z7982 Long term (current) use of aspirin: Secondary | ICD-10-CM | POA: Diagnosis not present

## 2021-10-13 DIAGNOSIS — C7951 Secondary malignant neoplasm of bone: Secondary | ICD-10-CM | POA: Insufficient documentation

## 2021-10-13 DIAGNOSIS — Z9189 Other specified personal risk factors, not elsewhere classified: Secondary | ICD-10-CM

## 2021-10-13 LAB — CBC WITH DIFFERENTIAL (CANCER CENTER ONLY)
Abs Immature Granulocytes: 0.05 10*3/uL (ref 0.00–0.07)
Basophils Absolute: 0.1 10*3/uL (ref 0.0–0.1)
Basophils Relative: 1 %
Eosinophils Absolute: 0.4 10*3/uL (ref 0.0–0.5)
Eosinophils Relative: 6 %
HCT: 41.5 % (ref 39.0–52.0)
Hemoglobin: 14.2 g/dL (ref 13.0–17.0)
Immature Granulocytes: 1 %
Lymphocytes Relative: 15 %
Lymphs Abs: 1.1 10*3/uL (ref 0.7–4.0)
MCH: 31.6 pg (ref 26.0–34.0)
MCHC: 34.2 g/dL (ref 30.0–36.0)
MCV: 92.2 fL (ref 80.0–100.0)
Monocytes Absolute: 0.4 10*3/uL (ref 0.1–1.0)
Monocytes Relative: 5 %
Neutro Abs: 5.1 10*3/uL (ref 1.7–7.7)
Neutrophils Relative %: 72 %
Platelet Count: 203 10*3/uL (ref 150–400)
RBC: 4.5 MIL/uL (ref 4.22–5.81)
RDW: 13.7 % (ref 11.5–15.5)
WBC Count: 7.1 10*3/uL (ref 4.0–10.5)
nRBC: 0 % (ref 0.0–0.2)

## 2021-10-13 LAB — CMP (CANCER CENTER ONLY)
ALT: 14 U/L (ref 0–44)
AST: 13 U/L — ABNORMAL LOW (ref 15–41)
Albumin: 4.2 g/dL (ref 3.5–5.0)
Alkaline Phosphatase: 101 U/L (ref 38–126)
Anion gap: 6 (ref 5–15)
BUN: 18 mg/dL (ref 6–20)
CO2: 25 mmol/L (ref 22–32)
Calcium: 8.6 mg/dL — ABNORMAL LOW (ref 8.9–10.3)
Chloride: 105 mmol/L (ref 98–111)
Creatinine: 0.99 mg/dL (ref 0.61–1.24)
GFR, Estimated: >60 mL/min (ref >60)
Glucose, Bld: 153 mg/dL — ABNORMAL HIGH (ref 70–99)
Potassium: 4 mmol/L (ref 3.5–5.1)
Sodium: 136 mmol/L (ref 135–145)
Total Bilirubin: 0.4 mg/dL (ref 0.3–1.2)
Total Protein: 6.4 g/dL — ABNORMAL LOW (ref 6.5–8.1)

## 2021-10-13 LAB — LACTATE DEHYDROGENASE: LDH: 147 U/L (ref 98–192)

## 2021-10-13 MED ORDER — OXYCODONE HCL 10 MG PO TABS
10.0000 mg | ORAL_TABLET | ORAL | 0 refills | Status: DC | PRN
  Filled 2021-10-13: qty 90, 15d supply, fill #0

## 2021-10-13 MED ORDER — HEPARIN SOD (PORK) LOCK FLUSH 100 UNIT/ML IV SOLN
500.0000 [IU] | Freq: Once | INTRAVENOUS | Status: AC | PRN
  Administered 2021-10-13: 500 [IU]

## 2021-10-13 MED ORDER — SODIUM CHLORIDE 0.9 % IV SOLN: Freq: Once | INTRAVENOUS | Status: AC

## 2021-10-13 MED ORDER — SODIUM CHLORIDE 0.9 % IV SOLN
480.0000 mg | Freq: Once | INTRAVENOUS | Status: AC
  Administered 2021-10-13: 480 mg via INTRAVENOUS
  Filled 2021-10-13: qty 48

## 2021-10-13 MED ORDER — DENOSUMAB 120 MG/1.7ML ~~LOC~~ SOLN
120.0000 mg | Freq: Once | SUBCUTANEOUS | Status: AC
  Administered 2021-10-13: 120 mg via SUBCUTANEOUS
  Filled 2021-10-13: qty 1.7

## 2021-10-13 MED ORDER — SODIUM CHLORIDE 0.9% FLUSH
10.0000 mL | INTRAVENOUS | Status: DC | PRN
  Administered 2021-10-13: 10 mL

## 2021-10-13 MED ORDER — OXYCODONE HCL ER 15 MG PO T12A
15.0000 mg | EXTENDED_RELEASE_TABLET | Freq: Two times a day (BID) | ORAL | 0 refills | Status: DC
  Filled 2021-10-13: qty 34, 17d supply, fill #0

## 2021-10-13 NOTE — Progress Notes (Signed)
Ok to give [place] with Ca of 8.6 per DR Marin Olp. dph

## 2021-10-13 NOTE — Progress Notes (Signed)
Hematology and Oncology Follow Up Visit  Vincent David [id number] 1966-01-19 56 y.o. 10/13/2021   Principle Diagnosis:  Metastatic Renal Cell Carcinoma - bone mets  Current Therapy:   Nivoumab/Ipilimumab -- s/p cycle #3 -- started on 09/29/2019 -- maintenance Nivolumab q month -- Started on 12/29/2019 Xgeva 120 mg sq q 3 months -- next dose in 01/2022 XRT to right iliac mass/ L5 vertebral body/ Left femoral neck LEFT hip repair Cryoablation of left kidney mass-10/19/2020     Interim History:  Mr. Vincent David is back for follow-up.  His main complaint has been some numbness in the right leg.  I suspect this probably is from the tumor that at the L5 vertebral body.  He has had radiation to this area.  He is going to have an MRI on 10/23/2021.  This was ordered by Interventional Radiology.  I told that he may benefit from some steroid injections to try to help take down some inflammation in that nerve.  Otherwise, he is doing pretty well.  He has been pretty active.  He has had a good appetite.  He has had no nausea or vomiting.  He has had no cough or shortness of breath.  He has had no change in bowel or bladder habits.  He has had no leg weakness.  Currently, I would say his performance status is probably ECOG 1.   Medications:  Current Outpatient Medications:    amLODipine (NORVASC) 5 MG tablet, TAKE 1 TABLET EVERY DAY, Disp: 90 tablet, Rfl: 4   aspirin EC 81 MG tablet, Take 1 tablet (81 mg total) by mouth 2 (two) times daily., Disp: 60 tablet, Rfl: 0   ibuprofen (ADVIL) 200 MG tablet, Take 400 mg by mouth every 8 (eight) hours as needed (pain.)., Disp: , Rfl:    lamoTRIgine (LAMICTAL) 100 MG tablet, TAKE 1 TABLET BY MOUTH TWICE A DAY, Disp: 180 tablet, Rfl: 3   lamoTRIgine (LAMICTAL) 25 MG tablet, Take 3 tablets (75 mg total) by mouth 2 (two) times daily., Disp: 360 tablet, Rfl: 1   oxyCODONE (OXYCONTIN) 15 mg 12 hr tablet, Take 1 tablet (15 mg total) by mouth every 12 (twelve) hours., Disp:  34 tablet, Rfl: 0   Oxycodone HCl 10 MG TABS, Take 1 tablet (10 mg total) by mouth every 4 (four) hours as needed., Disp: 90 tablet, Rfl: 0   Suvorexant (BELSOMRA) 10 MG TABS, Take 10 mg by mouth at bedtime., Disp: 30 tablet, Rfl: 2 No current facility-administered medications for this visit.  Facility-Administered Medications Ordered in Other Visits:    sodium chloride flush (NS) 0.9 % injection 10 mL, 10 mL, Intracatheter, PRN, Volanda Napoleon, MD, 10 mL at 05/19/20 1343  Allergies: No Known Allergies  Past Medical History, Surgical history, Social history, and Family History were reviewed and updated.  Review of Systems: Review of Systems  Constitutional: Negative.   HENT:  Negative.    Eyes: Negative.   Respiratory: Negative.    Cardiovascular: Negative.   Gastrointestinal: Negative.   Endocrine: Negative.   Musculoskeletal:  Positive for back pain and flank pain.  Skin: Negative.   Neurological: Negative.   Hematological: Negative.   Psychiatric/Behavioral: Negative.      Physical Exam:  height is '5\' 7"'$  (1.702 m) and weight is 208 lb (94.3 kg). His oral temperature is 98.9 F (37.2 C). His blood pressure is 132/61 and his pulse is 75. His respiration is 18 and oxygen saturation is 96%.   Wt Readings from Last 3  Encounters:  10/13/21 208 lb (94.3 kg)  09/01/21 202 lb (91.6 kg)  07/21/21 206 lb (93.4 kg)    Physical Exam Vitals reviewed.  HENT:     Head: Normocephalic and atraumatic.  Eyes:     Pupils: Pupils are equal, round, and reactive to light.  Cardiovascular:     Rate and Rhythm: Normal rate and regular rhythm.     Heart sounds: Normal heart sounds.  Pulmonary:     Effort: Pulmonary effort is normal.     Breath sounds: Normal breath sounds.  Abdominal:     General: Bowel sounds are normal.     Palpations: Abdomen is soft.  Musculoskeletal:        General: No tenderness or deformity. Normal range of motion.     Cervical back: Normal range of motion.   Lymphadenopathy:     Cervical: No cervical adenopathy.  Skin:    General: Skin is warm and dry.     Findings: No erythema or rash.  Neurological:     Mental Status: He is alert and oriented to person, place, and time.  Psychiatric:        Behavior: Behavior normal.        Thought Content: Thought content normal.        Judgment: Judgment normal.      Lab Results  Component Value Date   WBC 7.1 10/13/2021   HGB 14.2 10/13/2021   HCT 41.5 10/13/2021   MCV 92.2 10/13/2021   PLT 203 10/13/2021     Chemistry      Component Value Date/Time   NA 137 09/01/2021 0803   K 4.1 09/01/2021 0803   CL 105 09/01/2021 0803   CO2 27 09/01/2021 0803   BUN 18 09/01/2021 0803   CREATININE 1.14 09/01/2021 0803   CREATININE 1.15 08/25/2019 1450      Component Value Date/Time   CALCIUM 8.8 (L) 09/01/2021 0803   ALKPHOS 100 09/01/2021 0803   AST 14 (L) 09/01/2021 0803   ALT 14 09/01/2021 0803   BILITOT 0.4 09/01/2021 0803      Impression and Plan: Mr. Vincent David is a very nice 56 year old white male.  He has metastatic renal cell carcinoma.  For some reason, his disease clearly favors his bones.  His lungs and liver and lymph nodes all appear to be clean.  So far, the immunotherapy really has worked nicely.  We will go ahead with the nivolumab.  Hopefully, we will also be able to give him Xgeva.  He will be interesting to see what the MRI has to show.  We will plan to have him come back to see [place] in another 6 weeks.     9/8/20238:30 AM

## 2021-10-15 ENCOUNTER — Other Ambulatory Visit: Payer: Self-pay | Admitting: Adult Health

## 2021-10-18 ENCOUNTER — Other Ambulatory Visit: Payer: Self-pay

## 2021-10-19 ENCOUNTER — Other Ambulatory Visit: Payer: Self-pay

## 2021-10-24 ENCOUNTER — Ambulatory Visit (HOSPITAL_COMMUNITY)
Admission: RE | Admit: 2021-10-24 | Discharge: 2021-10-24 | Disposition: A | Discharge status: Home / Self Care | Payer: 59 | Source: Ambulatory Visit | Attending: Interventional Radiology | Admitting: Interventional Radiology

## 2021-10-24 ENCOUNTER — Encounter: Payer: Self-pay | Admitting: Neurology

## 2021-10-24 ENCOUNTER — Ambulatory Visit (INDEPENDENT_AMBULATORY_CARE_PROVIDER_SITE_OTHER): Payer: BC Managed Care – PPO | Admitting: Neurology

## 2021-10-24 VITALS — BP 140/71 | HR 78 | Ht 67.72 in | Wt 209.0 lb

## 2021-10-24 DIAGNOSIS — G4733 Obstructive sleep apnea (adult) (pediatric): Secondary | ICD-10-CM | POA: Diagnosis not present

## 2021-10-24 DIAGNOSIS — C642 Malignant neoplasm of left kidney, except renal pelvis: Secondary | ICD-10-CM | POA: Diagnosis not present

## 2021-10-24 DIAGNOSIS — R569 Unspecified convulsions: Secondary | ICD-10-CM | POA: Diagnosis not present

## 2021-10-24 DIAGNOSIS — G893 Neoplasm related pain (acute) (chronic): Secondary | ICD-10-CM | POA: Diagnosis not present

## 2021-10-24 DIAGNOSIS — Z5181 Encounter for therapeutic drug level monitoring: Secondary | ICD-10-CM

## 2021-10-24 DIAGNOSIS — Z9989 Dependence on other enabling machines and devices: Secondary | ICD-10-CM

## 2021-10-24 MED ORDER — LAMOTRIGINE 100 MG PO TABS: 100.0000 mg | ORAL_TABLET | Freq: Two times a day (BID) | ORAL | 1 refills | Status: DC

## 2021-10-24 MED ORDER — LAMOTRIGINE 25 MG PO TABS: 75.0000 mg | ORAL_TABLET | Freq: Two times a day (BID) | ORAL | 1 refills | Status: DC

## 2021-10-24 MED ORDER — GADOBUTROL 1 MMOL/ML IV SOLN
9.0000 mL | Freq: Once | INTRAVENOUS | Status: AC | PRN
  Administered 2021-10-24: 9 mL via INTRAVENOUS

## 2021-10-24 NOTE — Progress Notes (Signed)
SLEEP MEDICINE CLINIC    Provider:  Larey Seat, MD  Primary Care Physician:  Emeterio Reeve, DO [address] [place] [address]     Referring Provider: Emeterio Reeve, Do [address] [place],  [place] [id number]          Chief Complaint according to patient   Patient presents with:         Pt states he had again nocturnal seizures since the last visit. last seizures were on September 19, 2021.  Visual blurring as a prodrome to seizures? Nocturnal seizure? .diagnosed with renal cancer in 2021. Dr Marin Olp treats him, he has been prescribing oxycontin.       HISTORY OF PRESENT ILLNESS:  Vincent David is a 56 y.o. year old Caucasian male patient is seen here  on 10/24/2021 in a RV. The patient reported a flurry of seizure activity between 09/19/2021 when he had 2 smaller staring attacks focal seizures that were not generalizing, then a night time seizure on 825 that was and all of these occurred in sleep.  In September he had another seizure and attributed this to irregular intake of medication.  He reports that the Lamictal is actually so small that he had missed doses without knowing the pills had slipped through his hands.  Lamictal was increased in late September phone call with Vaughan Browner 275 mg twice daily from previously 150 mg twice daily.    He is still a smoker and drinker. [name] He is still followed up for his renal cancer, he had developed hip pain initially which led to the work-up and finding of the tumor he has some lower back pain as well.  He is followed here mainly for sleep disorder and he has been a compliant CPAP user his machine was used 100% of the last 30 days for over 4 hours consecutively.   His average user time is 8 hours 31 minutes, minimum pressure is set at 6 maximum at 15 cm water with 3 cm EPR, his AHI is 0.9/h which is excellent resolution 95th percentile pressure is 13.7 cm water.   He gets supplies from [place]> he got  a new machine in 2 -2019  here.  He would like a local DME. HST due for 12/ 2023.      Mr pierce has followed in the last 2 vists with [name], NP. Last 07-18-2021.  Mr. [name] had last been seen in April in this office, at the time we made a decision to wean him off Keppra and onto Lamictal as a medication of choice for his nocturnal recurrent seizure-like events.  He was admitted to the epilepsy monitoring unit from the 24th through 27 May for video EEG monitoring his Keppra had been held at the time he was exposed to hyperventilation and photic stimulation, all his seizures has had happened during sleep therefore sleep deprivation was not a planned part of his work-up.  On the contrary we wanted him to sleep.  No typical events were captured patient requested to go home on the 27th as it was difficult for him to sleep in the hospital environment and multiple times employees had entered the epilepsy monitoring room and disturb him.  The diagnosis of nonepileptic events was discussed with the patient Keppra had not substantially change the frequency and therefore a change to lamotrigine have been recommended, he had a normal EEG background rhythm of 10 Hz frontocentral region sleep spindles were  noted symmetrically no EEG changes in hyperventilation or photic stimulation no epileptiform discharges were seen during the recording in daytime or at night again the patient felt that he was on independently sleep deprived by the comings and goings on the hospital floor.  After his discharge on the 27th he then suffered a seizure in early July this was after he had already been off Keppra and on Lamictal.  He is now using 50 mg twice daily, which we can increase 200 mg twice daily and further steps however he has at this time another medical concern.  Unfortunately he was diagnosed with a malignancy a finding that occurred while he was worked up for hip pain.PET scan revealed left sided kidney cancer,  spreading to lumbar and hip regions. [name]    HPI:  I have the pleasure of seeing Vincent David , a *right -handed White or Caucasian male with a possible sleep disorder. He  has a past medical history of Cancer [facility]), Cancer of kidney ([place]), Goals of care, counseling/discussion (08/28/2019), History of COVID-19 (10/03/2020), Postictal headache, Seizure ([place]), Sleep apnea, and Tinnitus. HTN, nocturnal injuries with seizures.    The patient had a seizure history - blurred vision seems to be a prodrome- days and hours before a seizure follows.  I have seen Mr Pafford in 06-23-2018 in a video consultation, he has since seen [name] on 02-09-2019.  History of Present Illness: 06-23-2018 HPI:  Vincent David is a 56 y.o. male , seen here  in a referral  from Dr. Marin Comment for evaluation of OSA and nocturnal seizures -  4 phone messages over the last 8 month indicating he seized that night/ 04-14-2018, , 01/14/2018, 10/10/ 2019 , and 07/18/2017. He reports not being as bothered by it as he wouldn't like to increase any medication dose- the side effects of higher medication doses made him nauseated, moody and irritable.   He reports being in pain , presumed from injuries he has suffered in nightly seizure.  This is 2 years had called the office and informed us that the patient had another nocturnal seizure on Monday, 22 March 21 and he had been compliant with his Keppra 1000 mg in the morning 1500 mg at night.  There was no illness no fever no sleep deprivation no other triggers that the patient could identify.  Just the feeling of blurred vision days before.  The wife approximately estimated the duration of the seizure at 1 minute or less and the last previous seizure was on 12-12-18.  After this phone conversation Vaughan Browner increase the Keppra dose to 1500 twice a day.  He has not had seizures since but it is also only less than 4 weeks ago that this increase took place.  He reports that he has  been irritable and  that he feels that the Keppra is contributing to a different personality.  The increase however has not increased the level of irritability he had already on 1000 mg twice daily before.  In the meantime in February he underwent an MRI of the brain with and without contrast which was a normal MRI interpreted by Dr. Andrey Spearman.  He did not mention hippocampal structures, he says that there was no evidence of ischemia but the ventricles were normal in size but there was no mass-effect or midline shift and no lesions seen.  There was also no evidence of sinusitis which sometimes plays a role.  DATE OF RECORDING: 02/07/2017  REFERRING M.D.:  Rikki Spearing,  M.D.  Study Performed:   Baseline Polysomnogram with EEG montage  HISTORY:   56 year old male Seizure patient reports his very  first seizure took place probably in the year 2012 prior to his  first sleep evaluation.  He also had a seizure during sleep in  January 2013 and was hospitalized for EEG in the hospital at the  time at [facility], [place]- the EEG returned negative.  There  was no special etiology found when he had yet another nocturnal  seizure in July 2013.  He was not on any medications at the time  and had not started on antiepileptic medications. Last seizure on  02-26-2016 and referred here on Keppra.  He reports a head injury  during [employer] in 1986. He was treated for complicated  migraines with visual aura.  In 03-29-2017 he under went a sleep study for OSA, with expanded EEG montage- no seizure activity documented.  developed Complex sleep apnea- CPAP was initiated at 5 cmH20 with heated humidity per AASM split night standards and pressure was advanced to 15 cm water,  the technician changed to BiPAP and ASV mode during the same night- BiPAP final pressure of 18/14cmH20 with or without ST did not improve on hypopneas, apneas and created a [employer] pattern ( AHI of 45). The modality was changed to ASV which helped  little, reducing the AHI to 13/hr.      At a CPAP pressure of 12 cmH20, there was a reduction of the AHI to 0.0 with Spo2 nadir at 92% and 110 minutes of sleep time with improvement of obstructive sleep apnea.    Sleep habits are as follows: The patient's dinner time is between 6 PM. The patient goes to bed at 8-9 PM and continues to sleep for 3 hour intervals , wakes spontaneously , not for bathroom breaks,. The preferred sleep position is laterally, right side, with the support of 2 pillows. Dreams are reportedly rare.[name]  4-5  AM is the usual rise time. The patient wakes up spontaneously.  He reports not feeling refreshed or restored in AM, and reports many injuries he has suffered, attributed to nocturnal seizure activity - rigid, convulsive type - no initial scream- in nocturnal sleep.    Review of Systems: Out of a complete 14 system review, the patient complains of only the following symptoms, and all other reviewed systems are negative.:  Fatigue, sleepiness , achiness.  How likely are you to doze in the following situations: 0 = not likely, 1 = slight chance, 2 = moderate chance, 3 = high chance   Sitting and Reading? Watching Television? Sitting inactive in a public place (theater or meeting)? As a passenger in a car for an hour without a break? Lying down in the afternoon when circumstances permit? Sitting and talking to someone? Sitting quietly after lunch without alcohol? In a car, while stopped for a few minutes in traffic?   Total = 10/ 24 points  pre CPAP - 10-24-2021: 2/ 24 points on CPAP   FSS endorsed at 40/ 63 points. - 10-24-2021: NA/ 8   Social History   Socioeconomic History   Marital status: Married    Spouse name: Otila Kluver   Number of children: 3   Years of education: Not on file   Highest education level: Not on file  Occupational History    Employer: OTHER  Tobacco Use   Smoking status: Every Day    Packs/day: 0.25    Years: 15.00  Total pack years: 3.75     Types: Cigarettes   Smokeless tobacco: Never   Tobacco comments:    "Not quite" 0.5 ppd  Vaping Use   Vaping Use: Never used  Substance and Sexual Activity   Alcohol use: Yes    Alcohol/week: 1.0 - 2.0 standard drink of alcohol    Types: 1 - 2 Cans of beer per week   Drug use: Never   Sexual activity: Yes    Partners: Female  Other Topics Concern   Not on file  Social History Narrative   Caffeine: 2 cups/day   Social Determinants of Health   Financial Resource Strain: Not on file  Food Insecurity: Not on file  Transportation Needs: Not on file  Physical Activity: Not on file  Stress: Not on file  Social Connections: Not on file    Family History  Problem Relation Age of Onset   Sleep apnea Mother    Diabetes Mother    High blood pressure Father    Cancer Maternal Uncle        type unknown "either pancreatic or kidney"    Past Medical History:  Diagnosis Date   Cancer ([place])    Cancer of kidney ([place])    Goals of care, counseling/discussion 08/28/2019   History of COVID-19 10/03/2020   Positive COVID swab test   Postictal headache    Seizure ([place]), nocturnal since 2013.     Sleep apnea, Obstructive in 2019, on CPAP , [facility], Dr. Brett Fairy.     Tinnitus     Past Surgical History:  Procedure Laterality Date   IR IMAGING GUIDED PORT INSERTION  09/28/2019   IR RADIOLOGIST EVAL & MGMT  08/30/2020   IR RADIOLOGIST EVAL & MGMT  12/01/2020   IR RADIOLOGIST EVAL & MGMT  06/12/2021   IR RADIOLOGIST EVAL & MGMT  06/27/2021   KNEE SURGERY Left    ORIF PELVIC FRACTURE Left 09/14/2019   Procedure: OPEN REDUCTION INTERNAL FIXATION (ORIF) LEFT HIP WITH AFFIXUS NAIL;  Surgeon: Frederik Pear, MD;  Location: [facility];  Service: Orthopedics;  Laterality: Left;   RADIOLOGY WITH ANESTHESIA N/A 10/19/2020   Procedure: CT CYROABLATION WITH ANESTHESIA;  Surgeon: Criselda Peaches, MD;  Location: [facility];  Service: Radiology;  Laterality: N/A;   SHOULDER SURGERY Left      Current  Outpatient Medications on File Prior to Visit  Medication Sig Dispense Refill   amLODipine (NORVASC) 5 MG tablet TAKE 1 TABLET EVERY DAY 90 tablet 4   aspirin EC 81 MG tablet Take 1 tablet (81 mg total) by mouth 2 (two) times daily. 60 tablet 0   ibuprofen (ADVIL) 200 MG tablet Take 400 mg by mouth every 8 (eight) hours as needed (pain.).     lamoTRIgine (LAMICTAL) 100 MG tablet TAKE 1 TABLET BY MOUTH TWICE A DAY 180 tablet 3   lamoTRIgine (LAMICTAL) 25 MG tablet TAKE 2 TABLETS BY MOUTH 2 TIMES DAILY. 360 tablet 1   oxyCODONE (OXYCONTIN) 15 mg 12 hr tablet Take 1 tablet (15 mg total) by mouth every 12 (twelve) hours. 34 tablet 0   Oxycodone HCl 10 MG TABS Take 1 tablet (10 mg total) by mouth every 4 (four) hours as needed. 90 tablet 0   Suvorexant (BELSOMRA) 10 MG TABS Take 10 mg by mouth at bedtime. 30 tablet 2   Current Facility-Administered Medications on File Prior to Visit  Medication Dose Route Frequency Provider Last Rate Last Admin   sodium chloride flush (NS)  0.9 % injection 10 mL  10 mL Intracatheter PRN Volanda Napoleon, MD   10 mL at 05/19/20 1343     Incidental CT findings: Abdominal aortic atherosclerosis. Normal caliber of the left renal vein. Mild hepatic steatosis.   SKELETON: Extensive hypermetabolic osseous metastasis. Posterior right seventh rib osseous and soft tissue lesion measures 5.5 x 3.9 cm and a S.U.V. max of 6.6 on 86/4.   Expansile right iliac mass measures 6.8 x 5.7 cm and a S.U.V. max of 8.5 on 177/4.   A left femoral neck lytic lesion with cortical involvement measures 1.5 cm and a S.U.V. max of 8.2 on 203/4.   Eccentric right L5 vertebral body lesion measures a S.U.V. max of 8.7 including on 163/4.   Incidental CT findings: Degenerative changes of the left hip.   IMPRESSION: 1. Constellation of findings, including hypermetabolic osseous lesions and an infiltrative dominant left renal mass which are most consistent with metastatic renal cell  carcinoma. 2. No evidence of hypermetabolic soft tissue metastasis. 3. Aortic atherosclerosis (ICD10-I70.0) and emphysema (ICD10-J43.9). 4. Mild hepatic steatosis.     Electronically Signed   By: Abigail Miyamoto M.D.   On: 09/02/2019 16:34    No Known Allergies  Physical exam:  Today's Vitals   10/24/21 0807  BP: (!) 140/71  Pulse: 78  5'7" and 209 lbs.   BMI :   Wt Readings from Last 3 Encounters:  10/13/21 208 lb (94.3 kg)  09/01/21 202 lb (91.6 kg)  07/21/21 206 lb (93.4 kg)     Ht Readings from Last 3 Encounters:  10/13/21 '5\' 7"'$  (1.702 m)  04/19/21 '5\' 7"'$  (1.702 m)  03/16/21 '5\' 7"'$  (1.702 m)      General: The patient is awake, alert and appears not in acute distress. Head: Normocephalic, atraumatic. Neck is supple. Mallampati 2  ,  no Tongue bite marks.  neck circumference:18.0 inches . Nasal airflow  patent.  Retrognathia is not  seen.   Cardiovascular:  Regular rate and cardiac rhythm by pulse,  without distended neck veins. Respiratory: deferred.  Skin:  Without evidence of ankle edema, or rash. Trunk: The patient's posture is erect.   Neurologic exam : The patient is awake and alert, oriented to place and time.   Memory subjective described as intact.  Attention span & concentration ability appears normal.  Speech is fluent,  without  dysarthria, dysphonia or aphasia.  Mood and affect are appropriate.   Cranial nerves: no loss of smell or taste reported  Pupils are equal and briskly reactive to light. Funduscopic exam deferred. He reports blurred vision, but no visual field loss.  Extraocular movements in vertical and horizontal planes were intact and without nystagmus. No Diplopia. Visual fields by finger perimetry are intact. Hearing was intact to soft voice and finger rubbing.    Facial sensation intact to fine touch.  Facial motor strength is symmetric and tongue and uvula move midline.  Neck ROM : rotation, tilt and flexion extension were normal for  age and shoulder shrug was symmetrical.    Motor exam:  Symmetric bulk, tone and ROM.  right hip pain- restricted movements.  Normal tone without cog- wheeling,  symmetric grip strength .  Sensory:  Proprioception tested in the upper extremities was normal.  Coordination: Rapid alternating movements in the fingers/hands were of normal speed.  The Finger-to-nose maneuver was intact without evidence of ataxia, dysmetria or tremor.  Gait and station: Patient could rise unassisted from a seated position, walked without  assistive device.  Limited walking distance due to pain, hip and low back.  Toe and heel walk were deferred.  Deep tendon reflexes: in the upper and lower extremities are symmetric and intact.    Total seizure history is about 10 years -  He is an active tobacco user, drinks alcohol , reportedly in moderation- still this is a seizure threshold lowering agent.   We discussed this again .     After spending a total time of 35 plus minutes face to face and additional time for physical and neurologic examination, review of laboratory studies,  personal review of imaging studies, reports and results of other testing and review of referral information / records as far as provided in visit, I have established the following assessments:  1) nocturnal seizures -no longer  considered possible non-epileptic. EMU without abnormal findings, see reports by to Dr Benita Gutter , MD EMU. He had 3 seizures this august 2023.  2) OSA on CPAP- main reason for referral  - transfer of CPAP care  3) irritabiity on Keppra. Changed to lamictal 175 mg at night and in AM.  4) Lamictal may also help with generalized and social anxiety, worsening during time of cancer diagnosis ( PET scan reviewed).  5)   chronic cancer pain- lytic mets to spine and hip.    My Plan is to proceed with: 1) Lamictal 175 mg bid.  2) keep on CPAP - Iordered a new HST for baseline in 12-2021 to replace his machine in early 2024.   3) cancer diagnosis, with pain.  Opiate pain meds.   His next visit in 4-6 month with NP.   I would like to thank  Emeterio Reeve, Do [address] [place],  [place] [address] for allowing me to meet with and to take care of this pleasant patient.   I plan to follow up  personally or through our NP within 6 month. Needs [place], FSS /  CPAP download. lab tests and review of cancer work up. The patient has to keep a seizure diary- and we will need to document this in each visit, q 6 month.     Electronically signed by: Larey Seat, MD 10/24/2021 8:36 AM  [facility] [employer] [facility] Board certified by The [employer] of Sleep Medicine and Diplomate of the [employer] of Sleep Medicine. Board certified In Neurology through the [place], Fellow of the [employer] of Neurology. Medical Director of [employer] [facility].

## 2021-10-24 NOTE — Addendum Note (Signed)
Addended by: Larey Seat on: 10/24/2021 09:05 AM   Modules accepted: Orders

## 2021-10-24 NOTE — Patient Instructions (Signed)
Lamotrigine Tablets What is this medication? LAMOTRIGINE (la MOE tri jeen) prevents and controls seizures in people with epilepsy. It may also be used to treat bipolar disorder. It works by calming overactive nerves in your body. This medicine may be used for other purposes; ask your health care provider or pharmacist if you have questions. COMMON BRAND NAME(S): Lamictal, Subvenite What should I tell my care team before I take this medication? They need to know if you have any of these conditions: Heart disease History of irregular heartbeat Immune system problems Kidney disease Liver disease Low levels of folic acid in the blood Lupus Mental illness Suicidal thoughts, plans, or attempt; a previous suicide attempt by you or a family member An unusual or allergic reaction to lamotrigine or other seizure medications, other medications, foods, dyes, or preservatives Pregnant or trying to get pregnant Breast-feeding How should I use this medication? Take this medication by mouth with a glass of water. Follow the directions on the prescription label. Do not chew these tablets. If this medication upsets your stomach, take it with food or milk. Take your doses at regular intervals. Do not take your medication more often than directed. A special MedGuide will be given to you by the pharmacist with each new prescription and refill. Be sure to read this information carefully each time. Talk to your care team about the use of this medication in children. While this medication may be prescribed for children as young as 2 years for selected conditions, precautions do apply. Overdosage: If you think you have taken too much of this medicine contact a poison control center or emergency room at once. NOTE: This medicine is only for you. Do not share this medicine with others. What if I miss a dose? If you miss a dose, take it as soon as you can. If it is almost time for your next dose, take only that dose.  Do not take double or extra doses. What may interact with this medication? Atazanavir Birth control pills Certain medications for irregular heartbeat Certain medications for seizures like carbamazepine, phenobarbital, phenytoin, primidone, valproic acid Lopinavir Rifampin Ritonavir This list may not describe all possible interactions. Give your health care provider a list of all the medicines, herbs, non-prescription drugs, or dietary supplements you use. Also tell them if you smoke, drink alcohol, or use illegal drugs. Some items may interact with your medicine. What should I watch for while using this medication? Visit your care team for regular checks on your progress. If you take this medication for seizures, wear a Medic Alert bracelet or necklace. Carry an identification card with information about your condition, medications, and care team. It is important to take this medication exactly as directed. When first starting treatment, your dose will need to be adjusted slowly. It may take weeks or months before your dose is stable. You should contact your care team if your seizures get worse or if you have any new types of seizures. Do not stop taking this medication unless instructed by your care team. Stopping your medication suddenly can increase your seizures or their severity. This medication may cause serious skin reactions. They can happen weeks to months after starting the medication. Contact your care team right away if you notice fevers or flu-like symptoms with a rash. The rash may be red or purple and then turn into blisters or peeling of the skin. Or, you might notice a red rash with swelling of the face, lips or lymph nodes in your   neck or under your arms. You may get drowsy, dizzy, or have blurred vision. Do not drive, use machinery, or do anything that needs mental alertness until you know how this medication affects you. To reduce dizzy or fainting spells, do not sit or stand up  quickly, especially if you are an older patient. Alcohol can increase drowsiness and dizziness. Avoid alcoholic drinks. If you are taking this medication for bipolar disorder, it is important to report any changes in your mood to your care team. If your condition gets worse, you get mentally depressed, feel very hyperactive or manic, have difficulty sleeping, or have thoughts of hurting yourself or committing suicide, you need to get help from your care team right away. If you are a caregiver for someone taking this medication for bipolar disorder, you should also report these behavioral changes right away. The use of this medication may increase the chance of suicidal thoughts or actions. Pay special attention to how you are responding while on this medication. Your mouth may get dry. Chewing sugarless gum or sucking hard candy, and drinking plenty of water may help. Contact your care team if the problem does not go away or is severe. Women who become pregnant while using this medication may enroll in the [place] Pregnancy Registry by calling [phone]. This registry collects information about the safety of antiepileptic medication use during pregnancy. This medication may cause a decrease in folic acid. You should make sure that you get enough folic acid while you are taking this medication. Discuss the foods you eat and the vitamins you take with your care team. What side effects may I notice from receiving this medication? Side effects that you should report to your care team as soon as possible: Allergic reactions--skin rash, itching, hives, swelling of the face, lips, tongue, or throat Change in vision Fever, neck pain or stiffness, sensitivity to light, headache, nausea, vomiting, confusion Heart rhythm changes--fast or irregular heartbeat, dizziness, feeling faint or lightheaded, chest pain, trouble breathing Infection--fever, chills, cough, or sore throat Liver  injury--right upper belly pain, loss of appetite, nausea, light-colored stool, dark yellow or brown urine, yellowing skin or eyes, unusual weakness or fatigue Low red blood cell count--unusual weakness or fatigue, dizziness, headache, trouble breathing Rash, fever, and swollen lymph nodes Redness, blistering, peeling or loosening of the skin, including inside the mouth Thoughts of suicide or self-harm, worsening mood, or feelings of depression Unusual bruising or bleeding Side effects that usually do not require medical attention (report to your care team if they continue or are bothersome): Diarrhea Dizziness Drowsiness Headache Nausea Stomach pain Tremors or shaking This list may not describe all possible side effects. Call your doctor for medical advice about side effects. You may report side effects to FDA at 1-800-FDA-1088. Where should I keep my medication? Keep out of the reach of children and pets. Store at [employer] [address] (77 degrees F). Protect from light. Get rid of any unused medication after the expiration date. To get rid of medications that are no longer needed or have expired: Take the medication to a medication take-back program. Check with your pharmacy or law enforcement to find a location. If you cannot return the medication, check the label or package insert to see if the medication should be thrown out in the garbage or flushed down the toilet. If you are not sure, ask your care team. If it is safe to put it in the trash, empty the medication out of the  container. Mix the medication with cat litter, dirt, coffee grounds, or other unwanted substance. Seal the mixture in a bag or container. Put it in the trash. NOTE: This sheet is a summary. It may not cover all possible information. If you have questions about this medicine, talk to your doctor, pharmacist, or health care provider.  2023 Elsevier/Gold Standard (2020-05-06 00:00:00)

## 2021-10-25 ENCOUNTER — Other Ambulatory Visit: Payer: Self-pay

## 2021-11-01 ENCOUNTER — Encounter: Payer: Self-pay | Admitting: Neurology

## 2021-11-01 ENCOUNTER — Other Ambulatory Visit: Payer: Self-pay | Admitting: Neurology

## 2021-11-01 DIAGNOSIS — G4733 Obstructive sleep apnea (adult) (pediatric): Secondary | ICD-10-CM

## 2021-11-10 ENCOUNTER — Ambulatory Visit
Admission: RE | Admit: 2021-11-10 | Discharge: 2021-11-10 | Disposition: A | Discharge status: Home / Self Care | Payer: PRIVATE HEALTH INSURANCE | Source: Ambulatory Visit | Attending: Interventional Radiology | Admitting: Interventional Radiology

## 2021-11-10 ENCOUNTER — Encounter: Payer: Self-pay | Admitting: Lab

## 2021-11-10 DIAGNOSIS — C642 Malignant neoplasm of left kidney, except renal pelvis: Secondary | ICD-10-CM

## 2021-11-10 DIAGNOSIS — C7951 Secondary malignant neoplasm of bone: Secondary | ICD-10-CM | POA: Diagnosis not present

## 2021-11-10 HISTORY — PX: IR RADIOLOGIST EVAL & MGMT: IMG5224

## 2021-11-10 NOTE — Progress Notes (Signed)
Chief Complaint: Patient was consulted remotely today (TeleHealth) for left renal cell carcinoma with osseous metastatic disease at the request of Vincent Blankenburg K.    Referring Physician(s): Burney Gauze, MD  History of Present Illness: Vincent David is a 56 y.o. male with left renal cell carcinoma metastatic to bone.  He has osseous metastases to the left proximal femur, right iliac wing and right aspect of L5.  His disease was diagnosed in 2021 and he has been on immunotherapy since 09/29/19.  Currently on [place].  His bone lesions have been treated with XRT and he underwent prophylactic ORIF of the left proximal femoral lesion.    His disease burden has been relatively stable.  He presents today at the kind request of Dr. Marin David to discuss minimally invasive treatment of his primary left renal lesion.  There are reports of improvement in remote metastatic disease following treatment/debulking of the primary tumor.  This is thought to be due to removal of pro renal cell growth factors admitted by the primary tumor.   He underwent successful left renal cryoablation on 10/19/20.  The procedure was uneventful.  Similarly, his recovery was quite easy.  No hematuria, flank pain or abdominal pain.  He has back to full activity and working 2 days/week.     MRI 06/06/21 - Stable size and appearance of the previously treated mass in the mid left kidney. No evidence of recurrent or new metastatic disease.   He continues to experience low back pain as well as pain in his left hip and upper leg where he underwent prior intramedullary nail and transfemoral neck screw placement prophylactically to prevent pathologic fracture.   MRI 06/23/21 -  1. Secondary lesion involving the right side of the L5 vertebral body and pedicle, corresponding to lesion seen on prior FDG PET. No new lesion in the remainder of the lumbar spine. 2. Moderate right neural foraminal narrowing at L5-S1. 3. No  high-grade spinal canal stenosis.  PET CT 08/30/21 -  Stable appearance with no recurrent accentuated metabolic activity at the cryotherapy site, and new or recurrent osseous hypermetabolic activity. Stable findings of previously treated metastatic lesions noted including the right iliac bone, L5 vertebral body, and right posterior seventh rib.  MRI 10/24/21 - Interval slight contraction of left renal interpolar post ablation changes without recurrent/metastatic disease in the abdomen.   Vincent David continues to have low back pain which is most significant when he is standing or sitting in the same position for too long.  He describes his pain as achy and somewhat radiating into the lower extremities slightly worse on the right than the left.  He decided not to pursue epidural steroid injection after our last visit.  However, he continues to suffer with right greater than left lower extremity radiculopathy.  His symptoms are getting slowly worse.  We discussed the options of continuing to wait until his symptoms are too severe to tolerate versus pursuing treatment sooner.  He understands that the more inflamed the nerve becomes the more unlikely it is that the injections will completely alleviate his pain and that he will require multiple injections.  He is willing to proceed with ESI.  Past Medical History:  Diagnosis Date   Cancer [facility])    Cancer of kidney ([place])    Goals of care, counseling/discussion 08/28/2019   History of COVID-19 10/03/2020   Positive COVID swab test   Postictal headache    Seizure ([facility])    Sleep apnea  Tinnitus     Past Surgical History:  Procedure Laterality Date   IR IMAGING GUIDED PORT INSERTION  09/28/2019   IR RADIOLOGIST EVAL & MGMT  08/30/2020   IR RADIOLOGIST EVAL & MGMT  12/01/2020   IR RADIOLOGIST EVAL & MGMT  06/12/2021   IR RADIOLOGIST EVAL & MGMT  06/27/2021   KNEE SURGERY Left    ORIF PELVIC FRACTURE Left 09/14/2019   Procedure: OPEN REDUCTION INTERNAL  FIXATION (ORIF) LEFT HIP WITH AFFIXUS NAIL;  Surgeon: Frederik Pear, MD;  Location: [facility];  Service: Orthopedics;  Laterality: Left;   RADIOLOGY WITH ANESTHESIA N/A 10/19/2020   Procedure: CT CYROABLATION WITH ANESTHESIA;  Surgeon: Criselda Peaches, MD;  Location: [facility];  Service: Radiology;  Laterality: N/A;   SHOULDER SURGERY Left     Allergies: Patient has no known allergies.  Medications: Prior to Admission medications   Medication Sig Start Date End Date Taking? Authorizing Provider  amLODipine (NORVASC) 5 MG tablet TAKE 1 TABLET EVERY DAY 03/27/21   Volanda Napoleon, MD  aspirin EC 81 MG tablet Take 1 tablet (81 mg total) by mouth 2 (two) times daily. 09/14/19   Leighton Parody, PA-C  ibuprofen (ADVIL) 200 MG tablet Take 400 mg by mouth every 8 (eight) hours as needed (pain.).    [provider]  lamoTRIgine (LAMICTAL) 100 MG tablet Take 1 tablet (100 mg total) by mouth 2 (two) times daily. 10/24/21   Dohmeier, Asencion Partridge, MD  lamoTRIgine (LAMICTAL) 25 MG tablet Take 3 tablets (75 mg total) by mouth 2 (two) times daily. 10/24/21   Dohmeier, Asencion Partridge, MD  oxyCODONE (OXYCONTIN) 15 mg 12 hr tablet Take 1 tablet (15 mg total) by mouth every 12 (twelve) hours. 10/13/21   Volanda Napoleon, MD  Oxycodone HCl 10 MG TABS Take 1 tablet (10 mg total) by mouth every 4 (four) hours as needed. 10/13/21   Volanda Napoleon, MD  Suvorexant (BELSOMRA) 10 MG TABS Take 10 mg by mouth at bedtime. 09/01/21   Volanda Napoleon, MD     Family History  Problem Relation Age of Onset   Sleep apnea Mother    Diabetes Mother    High blood pressure Father    Cancer Maternal Uncle        type unknown "either pancreatic or kidney"    Social History   Socioeconomic History   Marital status: Married    Spouse name: Otila Kluver   Number of children: 3   Years of education: Not on file   Highest education level: Not on file  Occupational History    Employer: OTHER  Tobacco Use   Smoking status: Every Day     Packs/day: 0.25    Years: 15.00    Total pack years: 3.75    Types: Cigarettes   Smokeless tobacco: Never   Tobacco comments:    "Not quite" 0.5 ppd  Vaping Use   Vaping Use: Never used  Substance and Sexual Activity   Alcohol use: Yes    Alcohol/week: 1.0 - 2.0 standard drink of alcohol    Types: 1 - 2 Cans of beer per week   Drug use: Never   Sexual activity: Yes    Partners: Female  Other Topics Concern   Not on file  Social History Narrative   Caffeine: 2 cups/day   Social Determinants of Health   Financial Resource Strain: Not on file  Food Insecurity: Not on file  Transportation Needs: Not on file  Physical Activity:  Not on file  Stress: Not on file  Social Connections: Not on file    ECOG Status: 1 - Symptomatic but completely ambulatory  Review of Systems  Review of Systems: A 12 point ROS discussed and pertinent positives are indicated in the HPI above.  All other systems are negative.    Physical Exam No direct physical exam was performed (except for noted visual exam findings with Video Visits).    Vital Signs: There were no vitals taken for this visit.  Imaging: MR ABDOMEN WWO CONTRAST  Result Date: 10/25/2021 CLINICAL DATA:  Metastatic left renal cell carcinoma with prior cryoablation EXAM: MRI ABDOMEN WITHOUT AND WITH CONTRAST TECHNIQUE: Multiplanar multisequence MR imaging of the abdomen was performed both before and after the administration of intravenous contrast. CONTRAST:  37m GADAVIST GADOBUTROL 1 MMOL/ML IV SOLN COMPARISON:  Nuclear medicine PET-CT dated 08/30/2021, MR abdomen dated 06/06/2021 FINDINGS: Lower chest: No acute findings. Focal signal abnormality in the partially imaged superior vena cava likely corresponds to the patient's central venous catheter. Hepatobiliary: No suspicious mass or other parenchymal abnormality identified. Similar subcentimeter cyst in inferior segment 5. No bile duct dilation. Normal gallbladder. Pancreas: No  mass, inflammatory changes, or other parenchymal abnormality identified. Spleen: Within normal limits in size and appearance. Subcentimeter splenic cyst. No follow-up imaging recommended. Adrenals/Urinary Tract: No adrenal nodules. Interval slight contraction of post ablation changes of the left interpolar lesion without abnormal enhancement to suggest recurrent disease. Similar appearance of bilateral simple/minimally complex cysts measuring up to 44 mm in the left upper pole. Stomach/Bowel: Visualized portions within the abdomen are unremarkable. Vascular/Lymphatic: Aortic atherosclerosis. Infrarenal abdominal aortic aneurysm measures 3.1 cm, slightly increased from 2.9 cm on 07/26/2020. no lymphadenopathy. Other:  None. Musculoskeletal: Partially imaged right iliac and L5 metastases are better evaluated on prior PET. Small fat-containing paraumbilical hernia. IMPRESSION: 1. Interval slight contraction of left renal interpolar post ablation changes without recurrent/metastatic disease in the abdomen. 2. Partially imaged right iliac and L5 metastases. 3. Infrarenal abdominal aortic aneurysm measures 3.1 cm. Recommend follow-up ultrasound every 3 years. This recommendation follows ACR consensus guidelines: White Paper of the ACR Incidental Findings Committee II on Vascular Findings. J Am Coll Radiol 2013;; [id number] 4. Aortic Atherosclerosis (ICD10-I70.0). Electronically Signed   By: LDarrin NipperM.D.   On: 10/25/2021 10:54    Labs:  CBC: Recent Labs    06/09/21 0815 07/21/21 0830 09/01/21 0803 10/13/21 0752  WBC 8.5 6.7 7.5 7.1  HGB 14.9 14.7 14.5 14.2  HCT 43.1 44.7 43.2 41.5  PLT 228 215 192 203    COAGS: No results for input(s): "INR", "APTT" in the last 8760 hours.  BMP: Recent Labs    06/09/21 0815 07/21/21 0830 09/01/21 0803 10/13/21 0752  NA 135 140 137 136  K 4.1 3.9 4.1 4.0  CL 103 108 105 105  CO2 '25 25 27 25  '$ GLUCOSE 109* 143* 129* 153*  BUN 23* 22* 18 18  CALCIUM 9.4 9.0  8.8* 8.6*  CREATININE 1.04 0.71 1.14 0.99  GFRNONAA >60 >60 >60 >60    LIVER FUNCTION TESTS: Recent Labs    06/09/21 0815 07/21/21 0830 09/01/21 0803 10/13/21 0752  BILITOT 0.5 0.5 0.4 0.4  AST 14* 14* 14* 13*  ALT '13 13 14 14  '$ ALKPHOS 91 82 100 101  PROT 6.8 6.9 6.4* 6.4*  ALBUMIN 4.4 4.5 4.4 4.2    TUMOR MARKERS: No results for input(s): "AFPTM", "CEA", "CA199", "CHROMGRNA" in the last 8760 hours.  Assessment and  Plan:  Very pleasant 56 year old gentleman with kidney cancer metastatic to the bones.  Current MRI and PET CT imaging demonstrates no evidence of active disease.  Unfortunately, he continues to be plagued by pain both in his left lower extremity at the site of prior femoral ORIF as well as in his low back with mild right greater than left lower extremity radiculopathy.  An MRI of the lumbar spine demonstrates no evidence of acute fracture.  The lesion in the right aspect of his L5 vertebral body is again visualized.  There are no new lesions.  He does have mechanical degenerative spondylosis with moderate right foraminal narrowing at L5-S1.  I believe this may be contributing to his low back pain.  I think he would be an excellent candidate for epidural steroid injection as a minimally invasive initial therapy.   1.)  Please schedule for right L5-S1 epidural steroid injection. He would prefer to do this in November 2.)  Follow-up televisit approximately 2 weeks after the first injection. 3.)  Follow-up MRI of the abdomen with gadolinium contrast and clinic visit in 1 year.  Electronically Signed: Criselda Peaches 11/10/2021, 1:36 PM   I spent a total of  15 Minutes in remote  clinical consultation, greater than 50% of which was counseling/coordinating care for renal cancer with osseous metastatic disease and chronic low back pain with right foraminal stenosis.   Visit type: Audio only (telephone). Audio (no video) only due to patient preference. Alternative for  in-person consultation at [facility], [address], [place], [place]. This visit type was conducted due to national recommendations for restrictions regarding the COVID-19 Pandemic (e.g. social distancing).  This format is felt to be most appropriate for this patient at this time.  All issues noted in this document were discussed and addressed.

## 2021-11-21 DIAGNOSIS — G4733 Obstructive sleep apnea (adult) (pediatric): Secondary | ICD-10-CM | POA: Diagnosis not present

## 2021-11-23 ENCOUNTER — Inpatient Hospital Stay (HOSPITAL_BASED_OUTPATIENT_CLINIC_OR_DEPARTMENT_OTHER): Payer: BC Managed Care – PPO | Admitting: Medical Oncology

## 2021-11-23 ENCOUNTER — Inpatient Hospital Stay: Payer: BC Managed Care – PPO | Attending: Hematology & Oncology

## 2021-11-23 ENCOUNTER — Inpatient Hospital Stay: Payer: BC Managed Care – PPO

## 2021-11-23 ENCOUNTER — Other Ambulatory Visit (HOSPITAL_BASED_OUTPATIENT_CLINIC_OR_DEPARTMENT_OTHER): Payer: Self-pay

## 2021-11-23 ENCOUNTER — Telehealth: Payer: Self-pay | Admitting: Neurology

## 2021-11-23 ENCOUNTER — Encounter: Payer: Self-pay | Admitting: Hematology & Oncology

## 2021-11-23 VITALS — BP 142/70 | HR 85 | Temp 98.3°F | Resp 17 | Wt 211.0 lb

## 2021-11-23 DIAGNOSIS — C7951 Secondary malignant neoplasm of bone: Secondary | ICD-10-CM | POA: Insufficient documentation

## 2021-11-23 DIAGNOSIS — Z5112 Encounter for antineoplastic immunotherapy: Secondary | ICD-10-CM | POA: Insufficient documentation

## 2021-11-23 DIAGNOSIS — Z9189 Other specified personal risk factors, not elsewhere classified: Secondary | ICD-10-CM

## 2021-11-23 DIAGNOSIS — C642 Malignant neoplasm of left kidney, except renal pelvis: Secondary | ICD-10-CM

## 2021-11-23 DIAGNOSIS — S72001G Fracture of unspecified part of neck of right femur, subsequent encounter for closed fracture with delayed healing: Secondary | ICD-10-CM | POA: Diagnosis not present

## 2021-11-23 DIAGNOSIS — Z79899 Other long term (current) drug therapy: Secondary | ICD-10-CM | POA: Insufficient documentation

## 2021-11-23 DIAGNOSIS — Z7982 Long term (current) use of aspirin: Secondary | ICD-10-CM | POA: Diagnosis not present

## 2021-11-23 DIAGNOSIS — G893 Neoplasm related pain (acute) (chronic): Secondary | ICD-10-CM | POA: Diagnosis not present

## 2021-11-23 LAB — CMP (CANCER CENTER ONLY)
ALT: 17 U/L (ref 0–44)
AST: 16 U/L (ref 15–41)
Albumin: 4.4 g/dL (ref 3.5–5.0)
Alkaline Phosphatase: 98 U/L (ref 38–126)
Anion gap: 9 (ref 5–15)
BUN: 22 mg/dL — ABNORMAL HIGH (ref 6–20)
CO2: 26 mmol/L (ref 22–32)
Calcium: 9.3 mg/dL (ref 8.9–10.3)
Chloride: 102 mmol/L (ref 98–111)
Creatinine: 1.11 mg/dL (ref 0.61–1.24)
GFR, Estimated: >60 mL/min (ref >60)
Glucose, Bld: 114 mg/dL — ABNORMAL HIGH (ref 70–99)
Potassium: 4.3 mmol/L (ref 3.5–5.1)
Sodium: 137 mmol/L (ref 135–145)
Total Bilirubin: 0.5 mg/dL (ref 0.3–1.2)
Total Protein: 7 g/dL (ref 6.5–8.1)

## 2021-11-23 LAB — CBC WITH DIFFERENTIAL (CANCER CENTER ONLY)
Abs Immature Granulocytes: 0.06 10*3/uL (ref 0.00–0.07)
Basophils Absolute: 0.1 10*3/uL (ref 0.0–0.1)
Basophils Relative: 1 %
Eosinophils Absolute: 0.4 10*3/uL (ref 0.0–0.5)
Eosinophils Relative: 5 %
HCT: 45.3 % (ref 39.0–52.0)
Hemoglobin: 15.6 g/dL (ref 13.0–17.0)
Immature Granulocytes: 1 %
Lymphocytes Relative: 15 %
Lymphs Abs: 1.2 10*3/uL (ref 0.7–4.0)
MCH: 31.6 pg (ref 26.0–34.0)
MCHC: 34.4 g/dL (ref 30.0–36.0)
MCV: 91.7 fL (ref 80.0–100.0)
Monocytes Absolute: 0.5 10*3/uL (ref 0.1–1.0)
Monocytes Relative: 6 %
Neutro Abs: 5.8 10*3/uL (ref 1.7–7.7)
Neutrophils Relative %: 72 %
Platelet Count: 227 10*3/uL (ref 150–400)
RBC: 4.94 MIL/uL (ref 4.22–5.81)
RDW: 13.2 % (ref 11.5–15.5)
WBC Count: 8.1 10*3/uL (ref 4.0–10.5)
nRBC: 0 % (ref 0.0–0.2)

## 2021-11-23 LAB — LACTATE DEHYDROGENASE: LDH: 154 U/L (ref 98–192)

## 2021-11-23 MED ORDER — SODIUM CHLORIDE 0.9 % IV SOLN
480.0000 mg | Freq: Once | INTRAVENOUS | Status: AC
  Administered 2021-11-23: 480 mg via INTRAVENOUS
  Filled 2021-11-23: qty 48

## 2021-11-23 MED ORDER — SODIUM CHLORIDE 0.9% FLUSH
10.0000 mL | Freq: Once | INTRAVENOUS | Status: AC
  Administered 2021-11-23: 10 mL

## 2021-11-23 MED ORDER — SODIUM CHLORIDE 0.9 % IV SOLN: Freq: Once | INTRAVENOUS | Status: AC

## 2021-11-23 MED ORDER — HEPARIN SOD (PORK) LOCK FLUSH 100 UNIT/ML IV SOLN
500.0000 [IU] | Freq: Once | INTRAVENOUS | Status: AC | PRN
  Administered 2021-11-23: 500 [IU]

## 2021-11-23 MED ORDER — SODIUM CHLORIDE 0.9% FLUSH
10.0000 mL | INTRAVENOUS | Status: DC | PRN
  Administered 2021-11-23: 10 mL

## 2021-11-23 MED ORDER — OXYCODONE HCL 10 MG PO TABS
10.0000 mg | ORAL_TABLET | ORAL | 0 refills | Status: DC | PRN
  Filled 2021-11-23: qty 90, 15d supply, fill #0

## 2021-11-23 NOTE — Progress Notes (Signed)
Hematology and Oncology Follow Up Visit  Vincent David [id number] [birth year] 56 y.o. 11/23/2021   Principle Diagnosis:  Metastatic Renal Cell Carcinoma - bone mets  Current Therapy:   Nivoumab/Ipilimumab -- s/p cycle #3 -- started on 09/29/2019 -- maintenance Nivolumab q 6 weeks -- Started on 12/29/2019 Xgeva 120 mg sq q 3 months -- next dose in 01/2022 XRT to right iliac mass/ L5 vertebral body/ Left femoral neck LEFT hip repair Cryoablation of left kidney mass-10/19/2020     Interim History:  Mr. Vincent David is back for follow-up.    He reports that he has been doing well since his last visit. He had an MRI on 10/24/2021 on his abdomen which appeared stable.   He is being followed and treated by IR for his right leg weakness- having a nerve block in early November according to patient. This symptom has not been worsening since his last visit. He continues to take his oxycodone for his chronic pain due to his metastatic disease. He reports that he takes this medication a few times per week depending on his activity level. He asks for a refill today of his 10 mg oxycodone. He denies any sedation or negative side effects of this medication.   He reports that he is eating and drinking well. No night sweats. No unintentional weight loss.   Currently, I would say his performance status is probably ECOG 1. Wt Readings from Last 3 Encounters:  11/23/21 211 lb (95.7 kg)  10/24/21 209 lb (94.8 kg)  10/13/21 208 lb (94.3 kg)     Medications:  Current Outpatient Medications:    amLODipine (NORVASC) 5 MG tablet, TAKE 1 TABLET EVERY DAY, Disp: 90 tablet, Rfl: 4   aspirin EC 81 MG tablet, Take 1 tablet (81 mg total) by mouth 2 (two) times daily., Disp: 60 tablet, Rfl: 0   ibuprofen (ADVIL) 200 MG tablet, Take 400 mg by mouth every 8 (eight) hours as needed (pain.)., Disp: , Rfl:    lamoTRIgine (LAMICTAL) 100 MG tablet, Take 1 tablet (100 mg total) by mouth 2 (two) times daily., Disp: 180 tablet,  Rfl: 1   lamoTRIgine (LAMICTAL) 25 MG tablet, Take 3 tablets (75 mg total) by mouth 2 (two) times daily., Disp: 450 tablet, Rfl: 1   oxyCODONE (OXYCONTIN) 15 mg 12 hr tablet, Take 1 tablet (15 mg total) by mouth every 12 (twelve) hours., Disp: 34 tablet, Rfl: 0   Oxycodone HCl 10 MG TABS, Take 1 tablet (10 mg total) by mouth every 4 (four) hours as needed., Disp: 90 tablet, Rfl: 0   Suvorexant (BELSOMRA) 10 MG TABS, Take 10 mg by mouth at bedtime., Disp: 30 tablet, Rfl: 2 No current facility-administered medications for this visit.  Facility-Administered Medications Ordered in Other Visits:    heparin lock flush 100 unit/mL, 500 Units, Intracatheter, Once PRN, Ennever, Rudell Cobb, MD   nivolumab (OPDIVO) 480 mg in sodium chloride 0.9 % 100 mL chemo infusion, 480 mg, Intravenous, Once, Ennever, Rudell Cobb, MD   sodium chloride flush (NS) 0.9 % injection 10 mL, 10 mL, Intracatheter, PRN, Volanda Napoleon, MD, 10 mL at 05/19/20 1343   sodium chloride flush (NS) 0.9 % injection 10 mL, 10 mL, Intracatheter, PRN, Marin Olp, Rudell Cobb, MD  Allergies: No Known Allergies  Past Medical History, Surgical history, Social history, and Family History were reviewed and updated.  Review of Systems: Review of Systems  Constitutional: Negative.   HENT:  Negative.    Eyes: Negative.   Respiratory: Negative.  Cardiovascular: Negative.   Gastrointestinal: Negative.   Endocrine: Negative.   Musculoskeletal:  Positive for back pain and flank pain.  Skin: Negative.   Neurological: Negative.   Hematological: Negative.   Psychiatric/Behavioral: Negative.      Physical Exam:  weight is 211 lb (95.7 kg). His oral temperature is 98.3 F (36.8 C). His blood pressure is 142/70 (abnormal) and his pulse is 85. His respiration is 17 and oxygen saturation is 98%.   Wt Readings from Last 3 Encounters:  11/23/21 211 lb (95.7 kg)  10/24/21 209 lb (94.8 kg)  10/13/21 208 lb (94.3 kg)    Physical Exam Vitals reviewed.   HENT:     Head: Normocephalic and atraumatic.  Eyes:     Pupils: Pupils are equal, round, and reactive to light.  Cardiovascular:     Rate and Rhythm: Normal rate and regular rhythm.     Heart sounds: Normal heart sounds.  Pulmonary:     Effort: Pulmonary effort is normal.     Breath sounds: Normal breath sounds.  Abdominal:     General: Bowel sounds are normal.     Palpations: Abdomen is soft.  Musculoskeletal:        General: No tenderness or deformity. Normal range of motion.     Cervical back: Normal range of motion.  Lymphadenopathy:     Cervical: No cervical adenopathy.  Skin:    General: Skin is warm and dry.     Findings: No erythema or rash.  Neurological:     Mental Status: He is alert and oriented to person, place, and time.  Psychiatric:        Behavior: Behavior normal.        Thought Content: Thought content normal.        Judgment: Judgment normal.      Lab Results  Component Value Date   WBC 8.1 11/23/2021   HGB 15.6 11/23/2021   HCT 45.3 11/23/2021   MCV 91.7 11/23/2021   PLT 227 11/23/2021     Chemistry      Component Value Date/Time   NA 137 11/23/2021 0800   K 4.3 11/23/2021 0800   CL 102 11/23/2021 0800   CO2 26 11/23/2021 0800   BUN 22 (H) 11/23/2021 0800   CREATININE 1.11 11/23/2021 0800   CREATININE 1.15 08/25/2019 1450      Component Value Date/Time   CALCIUM 9.3 11/23/2021 0800   ALKPHOS 98 11/23/2021 0800   AST 16 11/23/2021 0800   ALT 17 11/23/2021 0800   BILITOT 0.5 11/23/2021 0800      Impression and Plan: Mr. Vincent David is a very nice 56 year old white male.  He has metastatic renal cell carcinoma.  For some reason, his disease clearly favors his bones.  His lungs and liver and lymph nodes all appear to be clean.  Immunotherapy has worked well for him thus far. Labs reviewed and acceptable for treatment today. RTC in 6 weeks (Verified with Dr. Elnoria Howard that he is getting treatment every 6 weeks instead of monthly as originally  reported.   Pain is well controlled on current regimen. PMPD reviewed. Refilling his 10 mg oxycodone as requested. He will continue follow up with IR regarding his and upcoming nerve block.   Disposition: Cycle 24 Day 1 Nivolumab today RTC 6 weeks Cycle 25 Day 1 Nivolumab (Verified with Dr. Elnoria Howard that he is getting treatment every 6 weeks instead of monthly) Refilled his oxycodone.  Delton See 01/2022  10/19/20238:56 AM

## 2021-11-23 NOTE — Patient Instructions (Signed)

## 2021-11-23 NOTE — Telephone Encounter (Signed)
[employer] Josem Kaufmann: [phone] (exp. 11/08/21 to 01/06/22)  Vmail was left on 11/08/21 & 11/16/21.

## 2021-11-24 ENCOUNTER — Encounter: Payer: Self-pay | Admitting: Hematology & Oncology

## 2021-11-24 ENCOUNTER — Other Ambulatory Visit (HOSPITAL_BASED_OUTPATIENT_CLINIC_OR_DEPARTMENT_OTHER): Payer: Self-pay

## 2021-12-04 ENCOUNTER — Telehealth: Payer: Self-pay | Admitting: Neurology

## 2021-12-04 DIAGNOSIS — R569 Unspecified convulsions: Secondary | ICD-10-CM

## 2021-12-04 MED ORDER — LAMOTRIGINE 100 MG PO TABS: 250.0000 mg | ORAL_TABLET | Freq: Two times a day (BID) | ORAL | 0 refills | Status: DC

## 2021-12-04 NOTE — Telephone Encounter (Signed)
Wife has called to report that on Saturday pt had 4 seizures in a 12 hour time frame, wife states pt declined going to the ED.  Please call to discuss, pt has been taking his medication according to spouse.

## 2021-12-04 NOTE — Telephone Encounter (Signed)
Called wife back. She states husband had first seizure this past Saturday(12/02/21) at 4:03am. She heard him via monitor when dog woke her up. He had labored breathing. Husband stayed up for awhile. Had next seizure at 9:40am within 15 min of going to sleep. He bite his tongue. Another at 1:38pm within 15 min of going to bed. Last one 3:10pm. All events lasted around 10 minutes.   Did not do anything outside his normal activity.  Denies him missing any doses of Lamictal. Takes 175 mg at night and in AM. No more stress than normal. Doing ok right now. Trying to keep him awake at the moment.   Updated med and allergy list, pharmacy on file. Aware I will speak with MD and call back with her recommendation. If it goes to [facility], she said its ok to send [employer].

## 2021-12-04 NOTE — Telephone Encounter (Signed)
Spoke w/ Dr. Brett Fairy. She would like pt to see Dr. April Manson for second opinion. I spoke with Dr. April Manson and he agreed to see pt. He would like pt to increase lamotrigine from '175mg'$  po BID to '250mg'$  po BID. If he has any more episodes, would like to order repeat ambulatory EEG.   I called wife. Relayed above recommendation and scheduled appt for 01/02/22 at 9:45am with Dr. April Manson. He asked they be scheduled in NP slot.

## 2021-12-19 IMAGING — CT CT HIP*L* W/O CM
2 of 5 series · 15 of 46 positions shown, 18 images · non-contrast
Comparison: PET-CT and MRI 12/14/2019

CLINICAL DATA: Left hip pain. History of left hip ORIF. History of
metastatic renal cell carcinoma.

EXAM:
CT PELVIS WITHOUT CONTRAST
CT OF THE LEFT HIP WITHOUT CONTRAST
TECHNIQUE: Multidetector CT imaging of the left hip and pelvis was performed
according to the standard protocol. Multiplanar CT image
reconstructions were also generated.

[Series 5: coronal st · coronal · 0.45mm/px · 3 of 117 slices shown]
[im 24/117  soft-tissue]
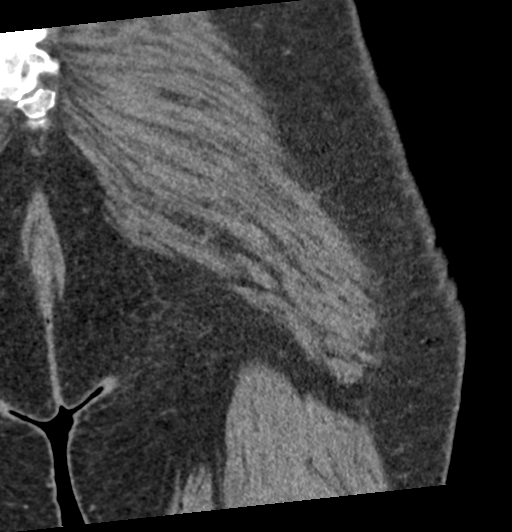
[im 47/117  soft-tissue]
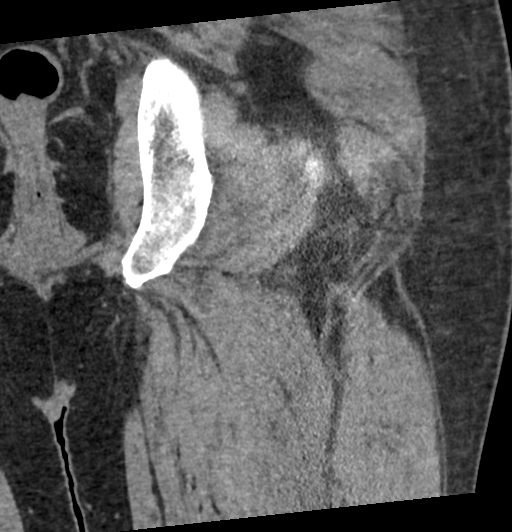
[im 70/117  soft-tissue]
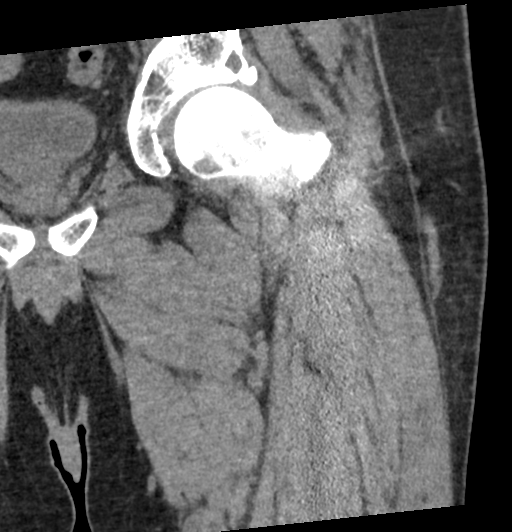

[Series 9: axial soft · axial · 0.48mm/px · z∈[-388,-188]mm · 12 of 112 slices shown, 15 images]
[im 8/112  soft-tissue]
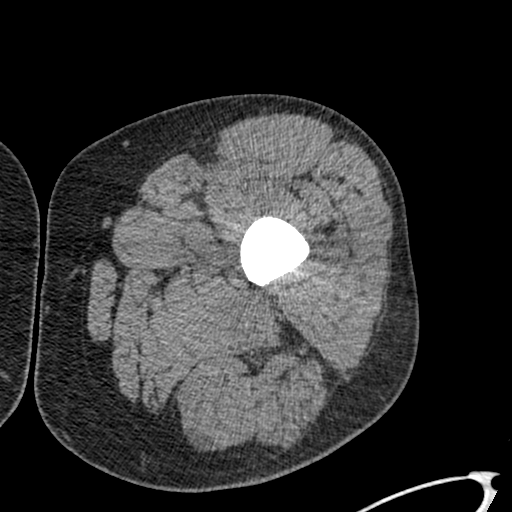
[im 8/112  bone]
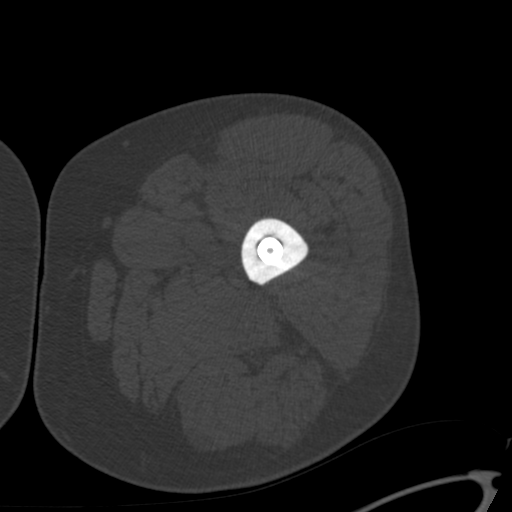
[im 22/112  soft-tissue]
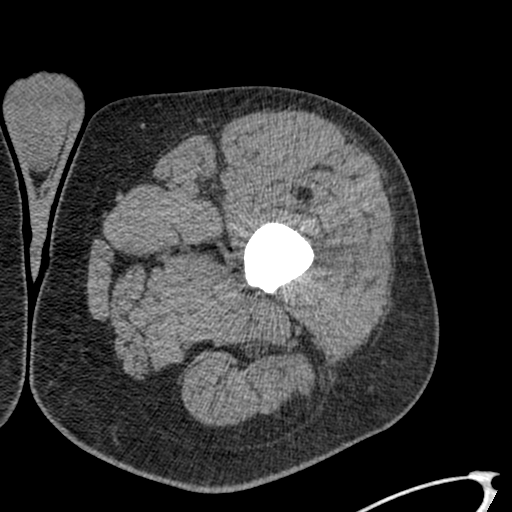
[im 33/112  soft-tissue]
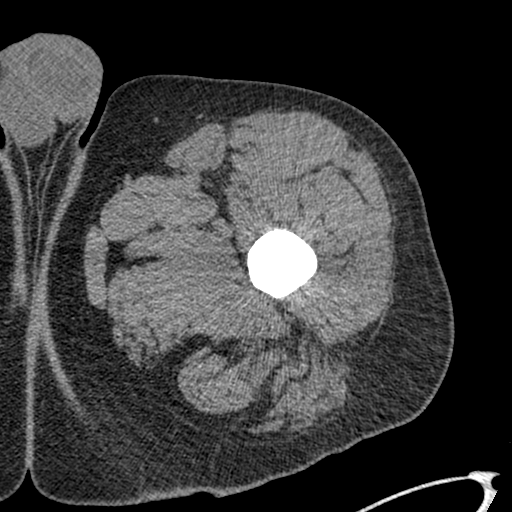
[im 43/112  soft-tissue]
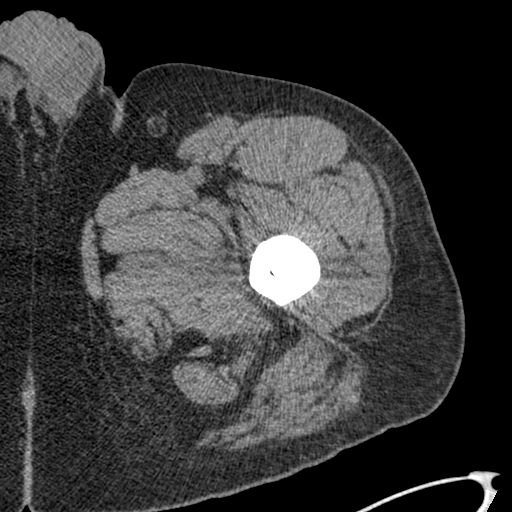
[im 58/112  soft-tissue]
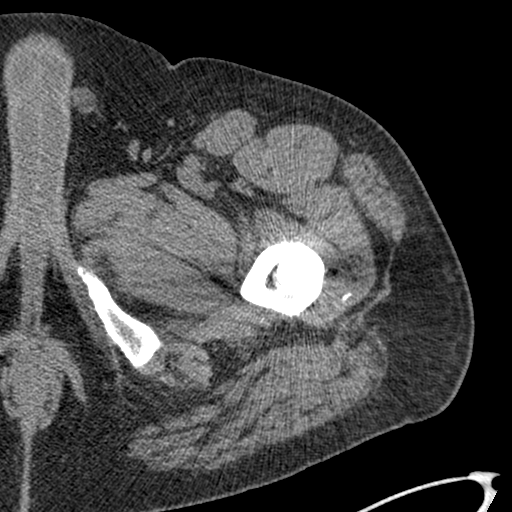
[im 69/112  soft-tissue]
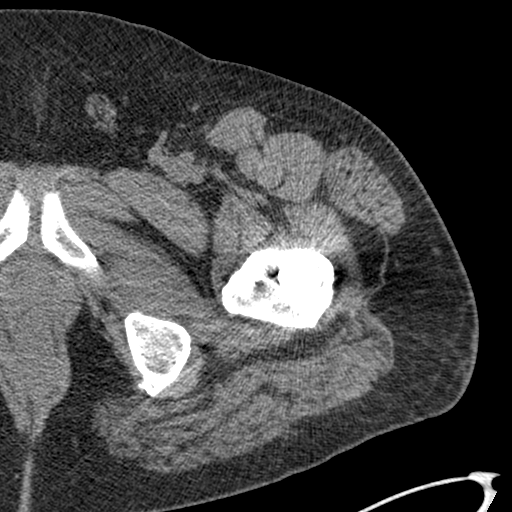
[im 79/112  soft-tissue]
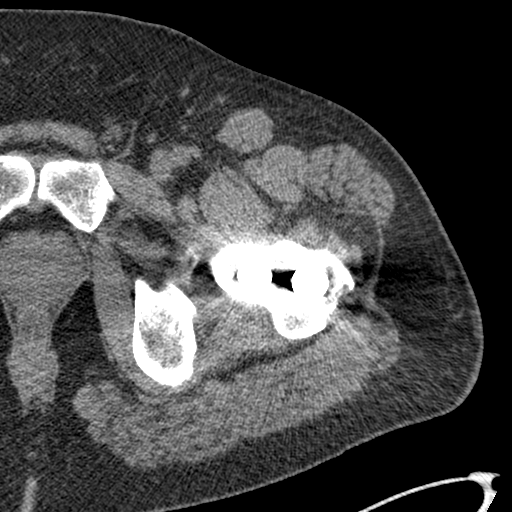
[im 94/112  soft-tissue]
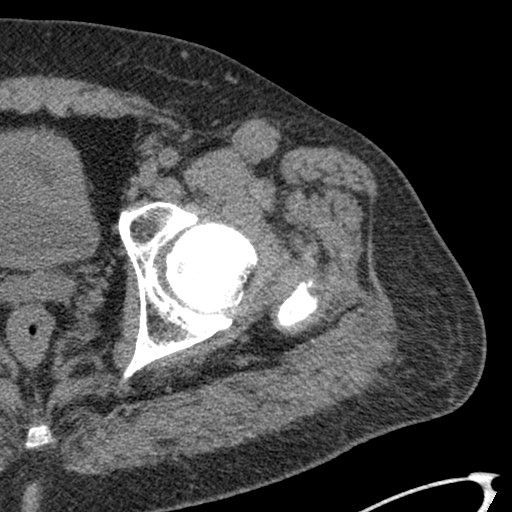
[im 97/112  lung]
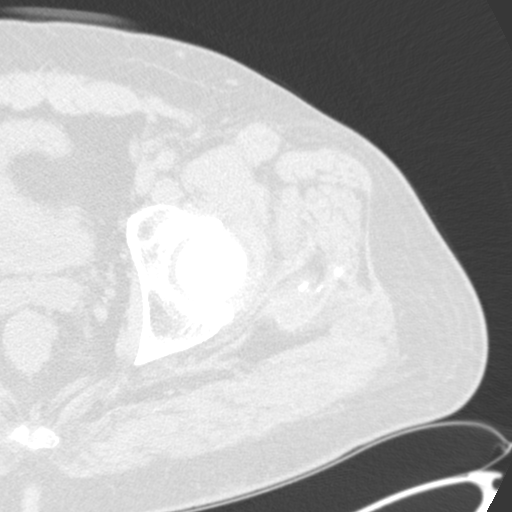
[im 101/112  lung]
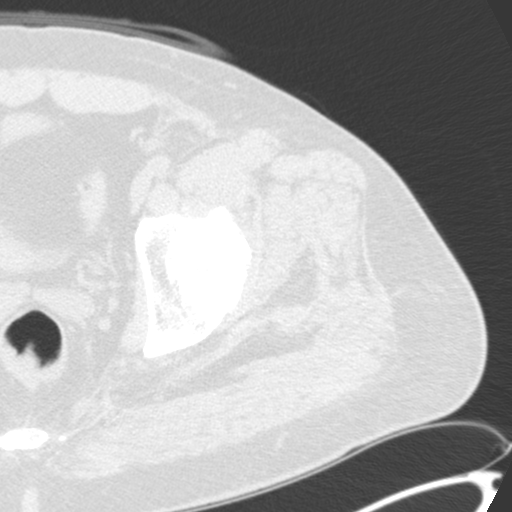
[im 104/112  soft-tissue]
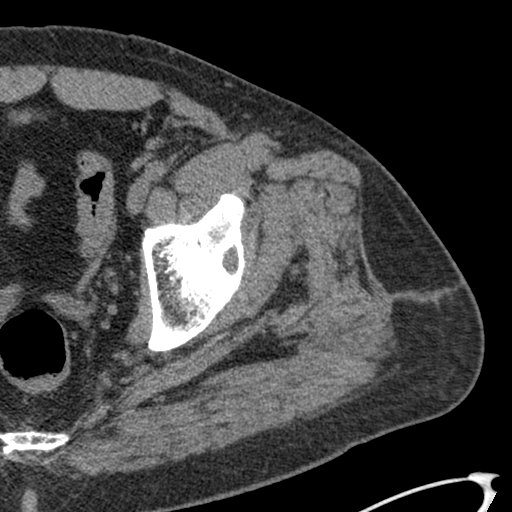
[im 104/112  lung]
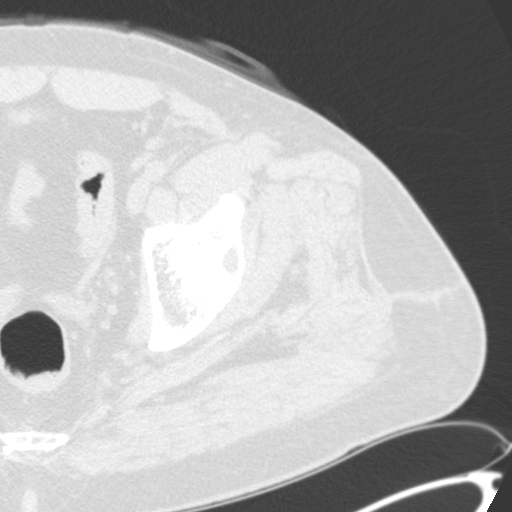
[im 104/112  bone]
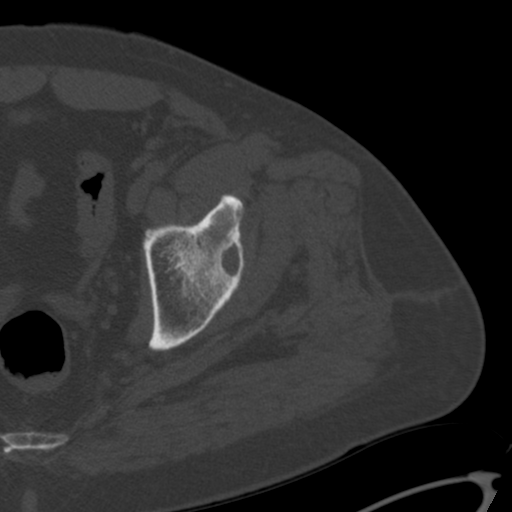
[im 108/112  lung]
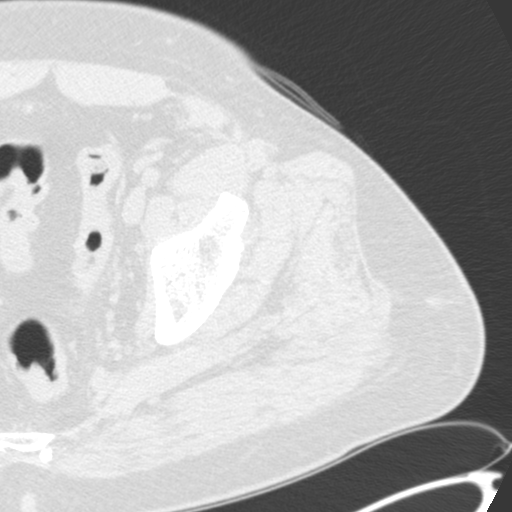

[15 of 46 positions shown; findings below may reference images not displayed]

FINDINGS: Bones/Joint/Cartilage

Expansile lytic mass centered within the right iliac wing stable in
size from prior study measuring 6.7 x 4.0 cm (pelvis series 2, image
55). Increasing sclerosis throughout the lesion suggesting post
treatment changes. Margins of the mass are also more well-defined
with sclerotic border. Lesion extends to the right acetabular roof.
No evidence to suggest acute pathologic fracture.

Lytic mass centered within the right side of the L5 vertebral body
and right pedicle measuring 4.3 x 2.1 cm (pelvis series 2, image
22), stable in size from prior. There is also increasing sclerosis
within this lesion particularly in the region of the right pedicle.
No evidence of pathologic fracture.

Lytic mass within the posterior aspect of the left femoral neck at
the base of the greater trochanter measuring approximately 2.0 x
cm (hip series 2, image 39), stable in size from prior, remeasured.

No new lytic bone lesion identified.

Prior left hip ORIF with IM rod and proximal lag screw. Areas of
linear partial cortical disruption at the anterior aspect of the
subtrochanteric left femur are less evident compared to the prior
study compatible with healing nondisplaced fractures (hip series 2,
image 56). No evidence of acute perihardware fracture.

Hip joints are intact without dislocation. Chronic subchondral cyst
within the superior left acetabulum.

Ligaments

Suboptimally assessed by CT.

Muscles and Tendons

No acute musculotendinous abnormality by CT.

Soft tissues

Soft tissue nodule adjacent to the left greater trochanter seen on
MRI of 08/22/2019 is no longer evident. No soft tissue edema or
fluid collection. No inguinal lymphadenopathy. No acute findings
within the pelvis. Scattered atherosclerosis.
IMPRESSION: 1. Healing nondisplaced cortical fractures of the anterior aspect of
the subtrochanteric left femur. No evidence of acute perihardware
fracture of the left hip.
2. Stable size of expansile lytic mass centered within the right
iliac wing with increasing sclerosis throughout the lesion
suggesting post treatment changes. No evidence to suggest acute
pathologic fracture.
3. Stable size of lytic mass centered within the right side of the
L5 vertebral body and right pedicle. There is also increasing
sclerosis within this lesion particularly in the region of the right
pedicle. No evidence of pathologic fracture.
4. Stable size of lytic mass within the posterior aspect of the left
femoral neck at the base of the greater trochanter.
5. No new suspicious bony lesions.

## 2021-12-22 ENCOUNTER — Encounter: Payer: Self-pay | Admitting: Hematology & Oncology

## 2021-12-22 ENCOUNTER — Encounter: Payer: Self-pay | Admitting: Neurology

## 2022-01-02 ENCOUNTER — Encounter: Payer: Self-pay | Admitting: Neurology

## 2022-01-02 ENCOUNTER — Ambulatory Visit (INDEPENDENT_AMBULATORY_CARE_PROVIDER_SITE_OTHER): Payer: BC Managed Care – PPO | Admitting: Neurology

## 2022-01-02 VITALS — BP 132/78 | HR 89 | Ht 67.0 in | Wt 216.0 lb

## 2022-01-02 DIAGNOSIS — G43109 Migraine with aura, not intractable, without status migrainosus: Secondary | ICD-10-CM

## 2022-01-02 DIAGNOSIS — R569 Unspecified convulsions: Secondary | ICD-10-CM | POA: Diagnosis not present

## 2022-01-02 DIAGNOSIS — Z5181 Encounter for therapeutic drug level monitoring: Secondary | ICD-10-CM

## 2022-01-02 MED ORDER — CLOBAZAM 10 MG PO TABS: 10.0000 mg | ORAL_TABLET | Freq: Every evening | ORAL | 5 refills | Status: DC

## 2022-01-02 MED ORDER — SUMATRIPTAN SUCCINATE 50 MG PO TABS: 50.0000 mg | ORAL_TABLET | ORAL | 0 refills | Status: DC | PRN

## 2022-01-02 NOTE — Progress Notes (Signed)
[facility]  PATIENT: Vincent David DOB: September 09, 1965  REQUESTING CLINICIAN: Emeterio Reeve, DO HISTORY FROM: Patient and spouse  REASON FOR VISIT: Seizure disorder    HISTORICAL  CHIEF COMPLAINT:  Chief Complaint  Patient presents with   New Patient (Initial Visit)    RM 67 with Wife. Here for second opinion on seizure most recent being 01/01/2022 at 420 am. He is currently taking Lamotrigine 250 mg twice daily.    HISTORY OF PRESENT ILLNESS:  This is a 56 year old gentleman past medical history of seizure disorder, recently diagnosed with kidney cancer, hypertension, sleep apnea, migraine headaches who is presenting for management of his epilepsy.  In terms of the seizure patient reports that he was diagnosed 12 years ago.  His seizures are described as generalized convulsion that only occurred during sleep, mainly during sleep at night but he did have seizures also while taking naps during the day.  His current frequency is about 1 seizure every 1 to 72-month.  With the seizures, he has tongue biting, urinary incontinence and he also fell off bed.  Denies any major injury.  Currently he is on lamotrigine 250 mg twice daily which was recently increased from 150 mg twice daily due to recent seizure.  He reports compliance with this medication. He thinks his only seizure trigger is his lack of sleep, due to his cancer and pain he has difficulty falling asleep and staying asleep.   Handedness: Right handed   Onset: 12 years ago   Seizure Type: Generalized convulsion, occurs during sleep   Current frequency: About once every 1 to 2 months   Any injuries from seizures: Tongue biting, falling off the bed   Seizure risk factors: TBI  Previous ASMs: Lamotrigine   Currenty ASMs: Lamotrigine 250 mg twice daily   ASMs side effects: Denies   Brain Images: Normal EEG   Previous EEGs: Normal Brain MRI   Patient also reports a long history of migraine  headaches.  He reports that his migraines are associated with auras and the auras described as constriction of his vision field. His vision will turn gray and constricted then followed by headaches.  When he have the headaches, he can last up to 2 days.  These are episodic.  He is currently not on any preventive or abortive medication.  OTHER MEDICAL CONDITIONS: Kidney cancer, hypertension, sleep apnea, seizure disorder, migraine headaches  REVIEW OF SYSTEMS: Full 14 system review of systems performed and negative with exception of: As noted in the HPI   ALLERGIES: No Known Allergies  HOME MEDICATIONS: Outpatient Medications Prior to Visit  Medication Sig Dispense Refill   amLODipine (NORVASC) 5 MG tablet TAKE 1 TABLET EVERY DAY 90 tablet 4   aspirin EC 81 MG tablet Take 1 tablet (81 mg total) by mouth 2 (two) times daily. 60 tablet 0   ibuprofen (ADVIL) 200 MG tablet Take 400 mg by mouth every 8 (eight) hours as needed (pain.).     lamoTRIgine (LAMICTAL) 100 MG tablet Take 2.5 tablets (250 mg total) by mouth 2 (two) times daily. 450 tablet 0   oxyCODONE (OXYCONTIN) 15 mg 12 hr tablet Take 1 tablet (15 mg total) by mouth every 12 (twelve) hours. 34 tablet 0   Oxycodone HCl 10 MG TABS Take 1 tablet (10 mg total) by mouth every 4 (four) hours as needed. 90 tablet 0   Suvorexant (BELSOMRA) 10 MG TABS Take 10 mg by mouth at bedtime. (Patient taking differently: Take 10 mg by  mouth as needed.) 30 tablet 2   Facility-Administered Medications Prior to Visit  Medication Dose Route Frequency Provider Last Rate Last Admin   sodium chloride flush (NS) 0.9 % injection 10 mL  10 mL Intracatheter PRN Volanda Napoleon, MD   10 mL at 05/19/20 1343    PAST MEDICAL HISTORY: Past Medical History:  Diagnosis Date   Cancer [facility])    Cancer of kidney ([place])    Goals of care, counseling/discussion 08/28/2019   History of COVID-19 10/03/2020   Positive COVID swab test   Postictal headache    Seizure ([place])     Sleep apnea    Tinnitus     PAST SURGICAL HISTORY: Past Surgical History:  Procedure Laterality Date   IR IMAGING GUIDED PORT INSERTION  09/28/2019   IR RADIOLOGIST EVAL & MGMT  08/30/2020   IR RADIOLOGIST EVAL & MGMT  12/01/2020   IR RADIOLOGIST EVAL & MGMT  06/12/2021   IR RADIOLOGIST EVAL & MGMT  06/27/2021   IR RADIOLOGIST EVAL & MGMT  11/10/2021   KNEE SURGERY Left    ORIF PELVIC FRACTURE Left 09/14/2019   Procedure: OPEN REDUCTION INTERNAL FIXATION (ORIF) LEFT HIP WITH AFFIXUS NAIL;  Surgeon: Frederik Pear, MD;  Location: [facility];  Service: Orthopedics;  Laterality: Left;   RADIOLOGY WITH ANESTHESIA N/A 10/19/2020   Procedure: CT CYROABLATION WITH ANESTHESIA;  Surgeon: Criselda Peaches, MD;  Location: [facility];  Service: Radiology;  Laterality: N/A;   SHOULDER SURGERY Left     FAMILY HISTORY: Family History  Problem Relation Age of Onset   Sleep apnea Mother    Diabetes Mother    High blood pressure Father    Cancer Maternal Uncle        type unknown "either pancreatic or kidney"    SOCIAL HISTORY: Social History   Socioeconomic History   Marital status: Married    Spouse name: Otila Kluver   Number of children: 3   Years of education: Not on file   Highest education level: Not on file  Occupational History    Employer: OTHER  Tobacco Use   Smoking status: Every Day    Packs/day: 0.25    Years: 15.00    Total pack years: 3.75    Types: Cigarettes   Smokeless tobacco: Never   Tobacco comments:    "Not quite" 0.5 ppd  Vaping Use   Vaping Use: Never used  Substance and Sexual Activity   Alcohol use: Yes    Alcohol/week: 1.0 - 2.0 standard drink of alcohol    Types: 1 - 2 Cans of beer per week   Drug use: Never   Sexual activity: Yes    Partners: Female  Other Topics Concern   Not on file  Social History Narrative   Caffeine: 2 cups/day   Social Determinants of Health   Financial Resource Strain: Not on file  Food Insecurity: Not on file  Transportation Needs:  Not on file  Physical Activity: Not on file  Stress: Not on file  Social Connections: Not on file  Intimate Partner Violence: Not on file     PHYSICAL EXAM  GENERAL EXAM/CONSTITUTIONAL: Vitals:  Vitals:   01/02/22 0946  BP: 132/78  Pulse: 89  Weight: 216 lb (98 kg)  Height: '5\' 7"'$  (1.702 m)   Body mass index is 33.83 kg/m. Wt Readings from Last 3 Encounters:  01/02/22 216 lb (98 kg)  11/23/21 211 lb (95.7 kg)  10/24/21 209 lb (94.8 kg)   Patient  is in no distress; well developed, nourished and groomed; neck is supple  EYES: Visual fields full to confrontation, Extraocular movements intacts,  No results found.  MUSCULOSKELETAL: Gait, strength, tone, movements noted in Neurologic exam below  NEUROLOGIC: MENTAL STATUS:      No data to display         awake, alert, oriented to person, place and time recent and remote memory intact normal attention and concentration language fluent, comprehension intact, naming intact fund of knowledge appropriate  CRANIAL NERVE:  2nd, 3rd, 4th, 6th - Visual fields full to confrontation, extraocular muscles intact, no nystagmus 5th - facial sensation symmetric 7th - facial strength symmetric 8th - hearing intact 9th - palate elevates symmetrically, uvula midline 11th - shoulder shrug symmetric 12th - tongue protrusion midline  MOTOR:  normal bulk and tone, full strength in the BUE, BLE  SENSORY:  normal and symmetric to light touch  COORDINATION:  finger-nose-finger, fine finger movements normal   GAIT/STATION:  normal   DIAGNOSTIC DATA (LABS, IMAGING, TESTING) - I reviewed patient records, labs, notes, testing and imaging myself where available.  Lab Results  Component Value Date   WBC 8.1 11/23/2021   HGB 15.6 11/23/2021   HCT 45.3 11/23/2021   MCV 91.7 11/23/2021   PLT 227 11/23/2021      Component Value Date/Time   NA 137 11/23/2021 0800   K 4.3 11/23/2021 0800   CL 102 11/23/2021 0800   CO2 26  11/23/2021 0800   GLUCOSE 114 (H) 11/23/2021 0800   BUN 22 (H) 11/23/2021 0800   CREATININE 1.11 11/23/2021 0800   CREATININE 1.15 08/25/2019 1450   CALCIUM 9.3 11/23/2021 0800   PROT 7.0 11/23/2021 0800   ALBUMIN 4.4 11/23/2021 0800   AST 16 11/23/2021 0800   ALT 17 11/23/2021 0800   ALKPHOS 98 11/23/2021 0800   BILITOT 0.5 11/23/2021 0800   GFRNONAA >60 11/23/2021 0800   GFRNONAA 71 08/25/2019 1450   GFRAA >60 10/20/2019 1117   GFRAA 83 08/25/2019 1450   Lab Results  Component Value Date   CHOL 206 (H) 02/04/2019   HDL 34 (L) 02/04/2019   LDLCALC 146 (H) 02/04/2019   TRIG 133 02/04/2019   No results found for: "HGBA1C" No results found for: "VITAMINB12" Lab Results  Component Value Date   TSH 0.892 07/21/2021   MRI Brain with and without contrast 09/07/19 No evidence of intracranial metastases. Minimal chronic microvascular ischemic changes.   LTM EEG 06/30/19 This study is within normal limits. No seizures or epileptiform discharges were seen throughout the recording    I personally reviewed brain Images and previous EEG reports.   ASSESSMENT AND PLAN  55 y.o. year old male  with history of seizure disorder, sleep apnea, hypertension, and recent diagnosis of kidney cancer who is presenting for management of the seizures.  He reported his seizures occurred during sleep or out of sleep.  He is currently on lamotrigine 250 mg twice daily.  I will check a lamotrigine level today but I will also start the patient on Onfi 10 mg at night.  I will contact him to go over the results.  I will also obtain an ambulatory EEG for background assessment. In terms of his migraines, I will try him on Imitrex.  Advised the patient take the medications as soon as he experienced aura.  He voices understanding.  I will see him in 6 months for follow-up or sooner if worse.   1. Nocturnal seizures ([place])  2. Migraine with aura and without status migrainosus, not intractable   3. Therapeutic  drug monitoring     Patient Instructions  Continue with Lamotrigine 250 mg twice daily  Will check a Lamotrigine level today Will add Clobazam 10 mg at bedtime  48 hrs ambulatory EEG  Sumatriptan as needed for the migraine headaches  Follow up in 6 months or sooner if worse     Per [facility] statutes, patients with seizures are not allowed to drive until they have been seizure-free for six months.  Other recommendations include using caution when using heavy equipment or power tools. Avoid working on ladders or at heights. Take showers instead of baths.  Do not swim alone.  Ensure the water temperature is not too high on the home water heater. Do not go swimming alone. Do not lock yourself in a room alone (i.e. bathroom). When caring for infants or small children, sit down when holding, feeding, or changing them to minimize risk of injury to the child in the event you have a seizure. Maintain good sleep hygiene. Avoid alcohol.  Also recommend adequate sleep, hydration, good diet and minimize stress.   During the Seizure  - First, ensure adequate ventilation and place patients on the floor on their left side  Loosen clothing around the neck and ensure the airway is patent. If the patient is clenching the teeth, do not force the mouth open with any object as this can cause severe damage - Remove all items from the surrounding that can be hazardous. The patient may be oblivious to what's happening and may not even know what he or she is doing. If the patient is confused and wandering, either gently guide him/her away and block access to outside areas - Reassure the individual and be comforting - Call 911. In most cases, the seizure ends before EMS arrives. However, there are cases when seizures may last over 3 to 5 minutes. Or the individual may have developed breathing difficulties or severe injuries. If a pregnant patient or a person with diabetes develops a seizure, it is prudent to  call an ambulance. - Finally, if the patient does not regain full consciousness, then call EMS. Most patients will remain confused for about 45 to 90 minutes after a seizure, so you must use judgment in calling for help. - Avoid restraints but make sure the patient is in a bed with padded side rails - Place the individual in a lateral position with the neck slightly flexed; this will help the saliva drain from the mouth and prevent the tongue from falling backward - Remove all nearby furniture and other hazards from the area - Provide verbal assurance as the individual is regaining consciousness - Provide the patient with privacy if possible - Call for help and start treatment as ordered by the caregiver   After the Seizure (Postictal Stage)  After a seizure, most patients experience confusion, fatigue, muscle pain and/or a headache. Thus, one should permit the individual to sleep. For the next few days, reassurance is essential. Being calm and helping reorient the person is also of importance.  Most seizures are painless and end spontaneously. Seizures are not harmful to others but can lead to complications such as stress on the lungs, brain and the heart. Individuals with prior lung problems may develop labored breathing and respiratory distress.     Orders Placed This Encounter  Procedures   Lamotrigine level   AMBULATORY EEG    Meds ordered this  encounter  Medications   SUMAtriptan (IMITREX) 50 MG tablet    Sig: Take 1 tablet (50 mg total) by mouth every 2 (two) hours as needed for migraine. May repeat in 2 hours if headache persists or recurs.    Dispense:  10 tablet    Refill:  0   cloBAZam (ONFI) 10 MG tablet    Sig: Take 1 tablet (10 mg total) by mouth at bedtime.    Dispense:  30 tablet    Refill:  5    Return in about 6 months (around 07/03/2022).    Alric Ran, MD 01/02/2022, 8:34 PM  [facility] [address] [place], [place]  [address] [phone]

## 2022-01-02 NOTE — Patient Instructions (Addendum)
Continue with Lamotrigine 250 mg twice daily  Will check a Lamotrigine level today Will add Clobazam 10 mg at bedtime  48 hrs ambulatory EEG  Sumatriptan as needed for the migraine headaches  Follow up in 6 months or sooner if worse

## 2022-01-03 LAB — LAMOTRIGINE LEVEL: Lamotrigine Lvl: 7 ug/mL (ref 2.0–20.0)

## 2022-01-04 ENCOUNTER — Other Ambulatory Visit: Payer: Self-pay

## 2022-01-04 DIAGNOSIS — C642 Malignant neoplasm of left kidney, except renal pelvis: Secondary | ICD-10-CM

## 2022-01-05 ENCOUNTER — Inpatient Hospital Stay: Payer: BC Managed Care – PPO

## 2022-01-05 ENCOUNTER — Encounter: Payer: Self-pay | Admitting: Hematology & Oncology

## 2022-01-05 ENCOUNTER — Inpatient Hospital Stay (HOSPITAL_BASED_OUTPATIENT_CLINIC_OR_DEPARTMENT_OTHER): Payer: BC Managed Care – PPO | Admitting: Hematology & Oncology

## 2022-01-05 ENCOUNTER — Inpatient Hospital Stay: Payer: BC Managed Care – PPO | Attending: Hematology & Oncology

## 2022-01-05 ENCOUNTER — Other Ambulatory Visit: Payer: Self-pay | Admitting: Pharmacist

## 2022-01-05 VITALS — BP 125/59 | HR 86 | Temp 98.2°F | Resp 19 | Ht 67.0 in | Wt 217.0 lb

## 2022-01-05 VITALS — BP 149/70

## 2022-01-05 DIAGNOSIS — C642 Malignant neoplasm of left kidney, except renal pelvis: Secondary | ICD-10-CM | POA: Diagnosis not present

## 2022-01-05 DIAGNOSIS — Z5112 Encounter for antineoplastic immunotherapy: Secondary | ICD-10-CM | POA: Insufficient documentation

## 2022-01-05 DIAGNOSIS — Z79899 Other long term (current) drug therapy: Secondary | ICD-10-CM | POA: Diagnosis not present

## 2022-01-05 DIAGNOSIS — C7951 Secondary malignant neoplasm of bone: Secondary | ICD-10-CM | POA: Diagnosis not present

## 2022-01-05 DIAGNOSIS — E782 Mixed hyperlipidemia: Secondary | ICD-10-CM | POA: Diagnosis not present

## 2022-01-05 DIAGNOSIS — Z9189 Other specified personal risk factors, not elsewhere classified: Secondary | ICD-10-CM | POA: Diagnosis not present

## 2022-01-05 DIAGNOSIS — Z7982 Long term (current) use of aspirin: Secondary | ICD-10-CM | POA: Insufficient documentation

## 2022-01-05 LAB — CBC WITH DIFFERENTIAL (CANCER CENTER ONLY)
Abs Immature Granulocytes: 0.13 10*3/uL — ABNORMAL HIGH (ref 0.00–0.07)
Basophils Absolute: 0 10*3/uL (ref 0.0–0.1)
Basophils Relative: 1 %
Eosinophils Absolute: 0.4 10*3/uL (ref 0.0–0.5)
Eosinophils Relative: 5 %
HCT: 41.6 % (ref 39.0–52.0)
Hemoglobin: 13.7 g/dL (ref 13.0–17.0)
Immature Granulocytes: 2 %
Lymphocytes Relative: 14 %
Lymphs Abs: 1 10*3/uL (ref 0.7–4.0)
MCH: 30.9 pg (ref 26.0–34.0)
MCHC: 32.9 g/dL (ref 30.0–36.0)
MCV: 93.7 fL (ref 80.0–100.0)
Monocytes Absolute: 0.4 10*3/uL (ref 0.1–1.0)
Monocytes Relative: 5 %
Neutro Abs: 5.2 10*3/uL (ref 1.7–7.7)
Neutrophils Relative %: 73 %
Platelet Count: 235 10*3/uL (ref 150–400)
RBC: 4.44 MIL/uL (ref 4.22–5.81)
RDW: 13.2 % (ref 11.5–15.5)
WBC Count: 7 10*3/uL (ref 4.0–10.5)
nRBC: 0 % (ref 0.0–0.2)

## 2022-01-05 LAB — CMP (CANCER CENTER ONLY)
ALT: 16 U/L (ref 0–44)
AST: 14 U/L — ABNORMAL LOW (ref 15–41)
Albumin: 4.2 g/dL (ref 3.5–5.0)
Alkaline Phosphatase: 96 U/L (ref 38–126)
Anion gap: 8 (ref 5–15)
BUN: 16 mg/dL (ref 6–20)
CO2: 25 mmol/L (ref 22–32)
Calcium: 8.7 mg/dL — ABNORMAL LOW (ref 8.9–10.3)
Chloride: 103 mmol/L (ref 98–111)
Creatinine: 1.06 mg/dL (ref 0.61–1.24)
GFR, Estimated: >60 mL/min (ref >60)
Glucose, Bld: 169 mg/dL — ABNORMAL HIGH (ref 70–99)
Potassium: 4 mmol/L (ref 3.5–5.1)
Sodium: 136 mmol/L (ref 135–145)
Total Bilirubin: 0.4 mg/dL (ref 0.3–1.2)
Total Protein: 6.6 g/dL (ref 6.5–8.1)

## 2022-01-05 LAB — LACTATE DEHYDROGENASE: LDH: 165 U/L (ref 98–192)

## 2022-01-05 MED ORDER — SODIUM CHLORIDE 0.9% FLUSH: 10.0000 mL | Freq: Once | INTRAVENOUS | Status: DC

## 2022-01-05 MED ORDER — SODIUM CHLORIDE 0.9% FLUSH
10.0000 mL | INTRAVENOUS | Status: DC | PRN
  Administered 2022-01-05: 10 mL

## 2022-01-05 MED ORDER — SODIUM CHLORIDE 0.9 % IV SOLN: Freq: Once | INTRAVENOUS | Status: AC

## 2022-01-05 MED ORDER — DENOSUMAB 120 MG/1.7ML ~~LOC~~ SOLN
120.0000 mg | Freq: Once | SUBCUTANEOUS | Status: AC
  Administered 2022-01-05: 120 mg via SUBCUTANEOUS
  Filled 2022-01-05: qty 1.7

## 2022-01-05 MED ORDER — HEPARIN SOD (PORK) LOCK FLUSH 100 UNIT/ML IV SOLN
500.0000 [IU] | Freq: Once | INTRAVENOUS | Status: AC | PRN
  Administered 2022-01-05: 500 [IU]

## 2022-01-05 MED ORDER — SODIUM CHLORIDE 0.9 % IV SOLN
480.0000 mg | Freq: Once | INTRAVENOUS | Status: AC
  Administered 2022-01-05: 480 mg via INTRAVENOUS
  Filled 2022-01-05: qty 48

## 2022-01-05 MED ORDER — HEPARIN SOD (PORK) LOCK FLUSH 100 UNIT/ML IV SOLN: 500.0000 [IU] | Freq: Once | INTRAVENOUS | Status: DC

## 2022-01-05 NOTE — Progress Notes (Signed)
Hematology and Oncology Follow Up Visit  Vincent David [id number] May 07, 1965 56 y.o. 01/05/2022   Principle Diagnosis:  Metastatic Renal Cell Carcinoma - bone mets  Current Therapy:   Nivoumab/Ipilimumab -- s/p cycle #4 -- started on 09/29/2019 -- maintenance Nivolumab q month -- Started on 12/29/2019 Xgeva 120 mg sq q 3 months -- next dose in 03/2022 XRT to right iliac mass/ L5 vertebral body/ Left femoral neck LEFT hip repair Cryoablation of left kidney mass-10/19/2020     Interim History:  Vincent David is back for follow-up.  He is doing quite well.  He is still bothered by the back and hip.  We will have Interventional Radiology do an epidural injection.  I think this will be at L5-S1.  He has had no problems with cough or shortness of breath.  He has had no nausea or vomiting.  He and his family had a very nice Thanksgiving.  There is been no change in bowel or bladder habits.  He has had no leg swelling.  There is been no leg pain.  Overall, I would have said that his performance status is ECOG 1.    Medications:  Current Outpatient Medications:    amLODipine (NORVASC) 5 MG tablet, TAKE 1 TABLET EVERY DAY, Disp: 90 tablet, Rfl: 4   aspirin EC 81 MG tablet, Take 1 tablet (81 mg total) by mouth 2 (two) times daily., Disp: 60 tablet, Rfl: 0   cloBAZam (ONFI) 10 MG tablet, Take 1 tablet (10 mg total) by mouth at bedtime., Disp: 30 tablet, Rfl: 5   ibuprofen (ADVIL) 200 MG tablet, Take 400 mg by mouth every 8 (eight) hours as needed (pain.)., Disp: , Rfl:    lamoTRIgine (LAMICTAL) 100 MG tablet, Take 2.5 tablets (250 mg total) by mouth 2 (two) times daily., Disp: 450 tablet, Rfl: 0   oxyCODONE (OXYCONTIN) 15 mg 12 hr tablet, Take 1 tablet (15 mg total) by mouth every 12 (twelve) hours., Disp: 34 tablet, Rfl: 0   Oxycodone HCl 10 MG TABS, Take 1 tablet (10 mg total) by mouth every 4 (four) hours as needed., Disp: 90 tablet, Rfl: 0   SUMAtriptan (IMITREX) 50 MG tablet, Take 50 mg by  mouth every 2 (two) hours as needed for migraine. May repeat in 2 hours if headache persists or recurs., Disp: , Rfl:  No current facility-administered medications for this visit.  Facility-Administered Medications Ordered in Other Visits:    sodium chloride flush (NS) 0.9 % injection 10 mL, 10 mL, Intracatheter, PRN, Volanda Napoleon, MD, 10 mL at 05/19/20 1343  Allergies: No Known Allergies  Past Medical History, Surgical history, Social history, and Family History were reviewed and updated.  Review of Systems: Review of Systems  Constitutional: Negative.   HENT:  Negative.    Eyes: Negative.   Respiratory: Negative.    Cardiovascular: Negative.   Gastrointestinal: Negative.   Endocrine: Negative.   Musculoskeletal:  Positive for back pain and flank pain.  Skin: Negative.   Neurological: Negative.   Hematological: Negative.   Psychiatric/Behavioral: Negative.      Physical Exam:  height is '5\' 7"'$  (1.702 m) and weight is 217 lb (98.4 kg). His oral temperature is 98.2 F (36.8 C). His blood pressure is 125/59 (abnormal) and his pulse is 86. His respiration is 19 and oxygen saturation is 97%.   Wt Readings from Last 3 Encounters:  01/05/22 217 lb (98.4 kg)  01/02/22 216 lb (98 kg)  11/23/21 211 lb (95.7 kg)  Physical Exam Vitals reviewed.  HENT:     Head: Normocephalic and atraumatic.  Eyes:     Pupils: Pupils are equal, round, and reactive to light.  Cardiovascular:     Rate and Rhythm: Normal rate and regular rhythm.     Heart sounds: Normal heart sounds.  Pulmonary:     Effort: Pulmonary effort is normal.     Breath sounds: Normal breath sounds.  Abdominal:     General: Bowel sounds are normal.     Palpations: Abdomen is soft.  Musculoskeletal:        General: No tenderness or deformity. Normal range of motion.     Cervical back: Normal range of motion.  Lymphadenopathy:     Cervical: No cervical adenopathy.  Skin:    General: Skin is warm and dry.      Findings: No erythema or rash.  Neurological:     Mental Status: He is alert and oriented to person, place, and time.  Psychiatric:        Behavior: Behavior normal.        Thought Content: Thought content normal.        Judgment: Judgment normal.      Lab Results  Component Value Date   WBC 7.0 01/05/2022   HGB 13.7 01/05/2022   HCT 41.6 01/05/2022   MCV 93.7 01/05/2022   PLT 235 01/05/2022     Chemistry      Component Value Date/Time   NA 137 11/23/2021 0800   K 4.3 11/23/2021 0800   CL 102 11/23/2021 0800   CO2 26 11/23/2021 0800   BUN 22 (H) 11/23/2021 0800   CREATININE 1.11 11/23/2021 0800   CREATININE 1.15 08/25/2019 1450      Component Value Date/Time   CALCIUM 9.3 11/23/2021 0800   ALKPHOS 98 11/23/2021 0800   AST 16 11/23/2021 0800   ALT 17 11/23/2021 0800   BILITOT 0.5 11/23/2021 0800      Impression and Plan: Vincent David is a very nice 56 year old white male.  He has metastatic renal cell carcinoma.  For some reason, his disease clearly favors his bones.  His lungs and liver and lymph nodes all appear to be clean.  So far, the immunotherapy really has worked nicely.  He will get his Delton See today.  I will go ahead and plan for another follow-up PET scan we will see him back in January.  Hopefully, he will be able to have the epidural injection by Interventional Radiology in the next couple weeks.    12/1/20238:50 AM

## 2022-01-05 NOTE — Patient Instructions (Signed)
[facility]  Discharge Instructions: Thank you for choosing [facility] to provide your oncology and hematology care.   If you have a lab appointment with the Cancer Center, please go directly to the Cancer Center and check in at the registration area.  Wear comfortable clothing and clothing appropriate for easy access to any Portacath or PICC line.   We strive to give you quality time with your provider. You may need to reschedule your appointment if you arrive late (15 or more minutes).  Arriving late affects you and other patients whose appointments are after yours.  Also, if you miss three or more appointments without notifying the office, you may be dismissed from the clinic at the provider's discretion.      For prescription refill requests, have your pharmacy contact our office and allow 72 hours for refills to be completed.    Today you received the following chemotherapy and/or immunotherapy agents Opdivo      To help prevent nausea and vomiting after your treatment, we encourage you to take your nausea medication as directed.  BELOW ARE SYMPTOMS THAT SHOULD BE REPORTED IMMEDIATELY: *FEVER GREATER THAN 100.4 F (38 C) OR HIGHER *CHILLS OR SWEATING *NAUSEA AND VOMITING THAT IS NOT CONTROLLED WITH YOUR NAUSEA MEDICATION *UNUSUAL SHORTNESS OF BREATH *UNUSUAL BRUISING OR BLEEDING *URINARY PROBLEMS (pain or burning when urinating, or frequent urination) *BOWEL PROBLEMS (unusual diarrhea, constipation, pain near the anus) TENDERNESS IN MOUTH AND THROAT WITH OR WITHOUT PRESENCE OF ULCERS (sore throat, sores in mouth, or a toothache) UNUSUAL RASH, SWELLING OR PAIN  UNUSUAL VAGINAL DISCHARGE OR ITCHING   Items with * indicate a potential emergency and should be followed up as soon as possible or go to the Emergency Department if any problems should occur.  Please show the CHEMOTHERAPY ALERT CARD or IMMUNOTHERAPY ALERT CARD at check-in to the  Emergency Department and triage nurse. Should you have questions after your visit or need to cancel or reschedule your appointment, please contact [facility]  [phone] and follow the prompts.  Office hours are 8:00 a.m. to 4:30 p.m. Monday - Friday. Please note that voicemails left after 4:00 p.m. may not be returned until the following business day.  We are closed weekends and major holidays. You have access to a nurse at all times for urgent questions. Please call the main number to the clinic [phone] and follow the prompts.  For any non-urgent questions, you may also contact your provider using MyChart. We now offer e-Visits for anyone 18 and older to request care online for non-urgent symptoms. For details visit [email or web address].   Also download the MyChart app! Go to the app store, search "MyChart", open the app, select [facility], and log in with your MyChart username and password.  Masks are optional in the cancer centers. If you would like for your care team to wear a mask while they are taking care of you, please let them know. You may have one support person who is at least 58 years old accompany you for your appointments. 

## 2022-01-09 ENCOUNTER — Encounter: Payer: Self-pay | Admitting: Medical

## 2022-01-09 ENCOUNTER — Ambulatory Visit (INDEPENDENT_AMBULATORY_CARE_PROVIDER_SITE_OTHER): Payer: BC Managed Care – PPO | Admitting: Medical

## 2022-01-09 VITALS — BP 132/63 | HR 96 | Temp 98.5°F | Resp 18 | Ht 67.0 in | Wt 214.0 lb

## 2022-01-09 DIAGNOSIS — E782 Mixed hyperlipidemia: Secondary | ICD-10-CM | POA: Diagnosis not present

## 2022-01-09 DIAGNOSIS — R739 Hyperglycemia, unspecified: Secondary | ICD-10-CM

## 2022-01-09 DIAGNOSIS — Z1211 Encounter for screening for malignant neoplasm of colon: Secondary | ICD-10-CM

## 2022-01-09 DIAGNOSIS — Z125 Encounter for screening for malignant neoplasm of prostate: Secondary | ICD-10-CM | POA: Diagnosis not present

## 2022-01-09 DIAGNOSIS — I10 Essential (primary) hypertension: Secondary | ICD-10-CM | POA: Diagnosis not present

## 2022-01-09 DIAGNOSIS — G4730 Sleep apnea, unspecified: Secondary | ICD-10-CM

## 2022-01-09 DIAGNOSIS — F17200 Nicotine dependence, unspecified, uncomplicated: Secondary | ICD-10-CM | POA: Diagnosis not present

## 2022-01-09 DIAGNOSIS — C642 Malignant neoplasm of left kidney, except renal pelvis: Secondary | ICD-10-CM

## 2022-01-09 DIAGNOSIS — R0683 Snoring: Secondary | ICD-10-CM

## 2022-01-09 DIAGNOSIS — R569 Unspecified convulsions: Secondary | ICD-10-CM

## 2022-01-09 LAB — LDL CHOLESTEROL, DIRECT: Direct LDL: 140 mg/dL

## 2022-01-09 LAB — LIPID PANEL
Cholesterol: 183 mg/dL (ref 0–200)
HDL: 29.1 mg/dL — ABNORMAL LOW (ref >39.00)
NonHDL: 154.23
Total CHOL/HDL Ratio: 6
Triglycerides: 204 mg/dL — ABNORMAL HIGH (ref 0.0–149.0)
VLDL: 40.8 mg/dL — ABNORMAL HIGH (ref 0.0–40.0)

## 2022-01-09 LAB — PSA: PSA: 0.63 ng/mL (ref 0.10–4.00)

## 2022-01-09 LAB — HEMOGLOBIN A1C: Hgb A1c MFr Bld: 6.2 % (ref 4.6–6.5)

## 2022-01-09 NOTE — Patient Instructions (Signed)
Htn- bp well controlled today. Continue amlodipine. Will get lipid panel today.  For migraine- continue imitrex.  For nocturnal seizure continue under tx per neurologist.  For renal cancer continue to follow up with Dr. Marin Olp.  For hx of smoking and sleep apnea. Placed referral to pulmonologist. Recommend smoking cessation. Specialist may do PFT.  Elevated sugar- get A1c today.   Screening psa today.  Chronic pain- on oxycodone.  Follow up date to be determined after lab review.

## 2022-01-09 NOTE — Progress Notes (Signed)
Subjective:    Patient ID: Vincent David, male    DOB: [birth year], 56 y.o.   MRN: [id number]  HPI  Pt in for first time.   Reviewed history today. Pt worked as Audiological scientist. Moved to [facility] 2015 or so.  Pt not working. Pt tries to go to gym but can't walk much. Tries to bike Shoulder surgery.  Kidney cancer- sees Dr Marin Olp.  Nocturnal seizures- 1. Nocturnal seizures ([place])   2. Migraine with aura and without status migrainosus, not intractable   3. Therapeutic drug monitoring       Patient Instructions  Continue with Lamotrigine 250 mg twice daily  Will check a Lamotrigine level today Will add Clobazam 10 mg at bedtime  48 hrs ambulatory EEG  Sumatriptan as needed for the migraine headaches  Follow up in 6 months or sooner if worse    Pt also on pain meds. Severe pain at time lumbar and in legs.   Pt needs referral to pulmonologist. Pt has possible copd and complicatons from [facility]. Pt also snores.neurologist has done sleep study. Pt has smoked for 36 years about 3/4 of a pack.  In 2021 psa was normal. No urinary symptoms.  Pt states last colonoscopy in 2010. Done in [place] and was negative.   Review of Systems  Constitutional:  Negative for chills, fatigue and fever.  HENT:  Negative for congestion, ear discharge and facial swelling.   Respiratory:  Negative for cough, chest tightness, shortness of breath and wheezing.   Cardiovascular:  Negative for chest pain and palpitations.  Gastrointestinal:  Negative for abdominal pain, diarrhea and nausea.  Musculoskeletal:  Negative for back pain, myalgias and neck stiffness.  Skin:  Negative for rash.  Neurological:  Negative for dizziness, numbness and headaches.       See hpi on seizures.  Hematological:  Negative for adenopathy. Does not bruise/bleed easily.  Psychiatric/Behavioral:  Negative for behavioral problems, decreased concentration and dysphoric mood.     Past Medical History:  Diagnosis Date    Cancer ([place])    Cancer of kidney ([place])    Goals of care, counseling/discussion 08/28/2019   History of COVID-19 10/03/2020   Positive COVID swab test   Postictal headache    Seizure ([facility])    Sleep apnea    Tinnitus      Social History   Socioeconomic History   Marital status: Married    Spouse name: Otila Kluver   Number of children: 3   Years of education: Not on file   Highest education level: Not on file  Occupational History    Employer: OTHER  Tobacco Use   Smoking status: Every Day    Packs/day: 0.25    Years: 15.00    Total pack years: 3.75    Types: Cigarettes   Smokeless tobacco: Never   Tobacco comments:    "Not quite" 0.5 ppd  Vaping Use   Vaping Use: Never used  Substance and Sexual Activity   Alcohol use: Yes    Alcohol/week: 1.0 - 2.0 standard drink of alcohol    Types: 1 - 2 Cans of beer per week   Drug use: Never   Sexual activity: Yes    Partners: Female  Other Topics Concern   Not on file  Social History Narrative   Caffeine: 2 cups/day   Social Determinants of Health   Financial Resource Strain: Not on file  Food Insecurity: Not on file  Transportation Needs: Not on file  Physical Activity:  Not on file  Stress: Not on file  Social Connections: Not on file  Intimate Partner Violence: Not on file    Past Surgical History:  Procedure Laterality Date   IR IMAGING GUIDED PORT INSERTION  09/28/2019   IR RADIOLOGIST EVAL & MGMT  08/30/2020   IR RADIOLOGIST EVAL & MGMT  12/01/2020   IR RADIOLOGIST EVAL & MGMT  06/12/2021   IR RADIOLOGIST EVAL & MGMT  06/27/2021   IR RADIOLOGIST EVAL & MGMT  11/10/2021   KNEE SURGERY Left    ORIF PELVIC FRACTURE Left 09/14/2019   Procedure: OPEN REDUCTION INTERNAL FIXATION (ORIF) LEFT HIP WITH AFFIXUS NAIL;  Surgeon: Frederik Pear, MD;  Location: [facility];  Service: Orthopedics;  Laterality: Left;   RADIOLOGY WITH ANESTHESIA N/A 10/19/2020   Procedure: CT CYROABLATION WITH ANESTHESIA;  Surgeon: Criselda Peaches, MD;   Location: [facility];  Service: Radiology;  Laterality: N/A;   SHOULDER SURGERY Left     Family History  Problem Relation Age of Onset   Sleep apnea Mother    Diabetes Mother    High blood pressure Father    Cancer Maternal Uncle        type unknown "either pancreatic or kidney"    No Known Allergies  Current Outpatient Medications on File Prior to Visit  Medication Sig Dispense Refill   amLODipine (NORVASC) 5 MG tablet TAKE 1 TABLET EVERY DAY 90 tablet 4   aspirin EC 81 MG tablet Take 1 tablet (81 mg total) by mouth 2 (two) times daily. 60 tablet 0   cloBAZam (ONFI) 10 MG tablet Take 1 tablet (10 mg total) by mouth at bedtime. 30 tablet 5   ibuprofen (ADVIL) 200 MG tablet Take 400 mg by mouth every 8 (eight) hours as needed (pain.).     lamoTRIgine (LAMICTAL) 100 MG tablet Take 2.5 tablets (250 mg total) by mouth 2 (two) times daily. 450 tablet 0   oxyCODONE (OXYCONTIN) 15 mg 12 hr tablet Take 1 tablet (15 mg total) by mouth every 12 (twelve) hours. 34 tablet 0   Oxycodone HCl 10 MG TABS Take 1 tablet (10 mg total) by mouth every 4 (four) hours as needed. 90 tablet 0   SUMAtriptan (IMITREX) 50 MG tablet Take 50 mg by mouth every 2 (two) hours as needed for migraine. May repeat in 2 hours if headache persists or recurs.     Current Facility-Administered Medications on File Prior to Visit  Medication Dose Route Frequency Provider Last Rate Last Admin   sodium chloride flush (NS) 0.9 % injection 10 mL  10 mL Intracatheter PRN Volanda Napoleon, MD   10 mL at 05/19/20 1343    BP 132/63   Pulse 96   Temp 98.5 F (36.9 C) (Oral)   Resp 18   Ht '5\' 7"'$  (1.702 m)   Wt 214 lb (97.1 kg)   SpO2 98%   BMI 33.52 kg/m        Objective:   Physical Exam  General Mental Status- Alert. General Appearance- Not in acute distress.   Skin General: Color- Normal Color. Moisture- Normal Moisture.  Neck Carotid Arteries- Normal color. Moisture- Normal Moisture. No carotid bruits. No  JVD.  Chest and Lung Exam Auscultation: Breath Sounds:-Normal.  Cardiovascular Auscultation:Rythm- Regular. Murmurs & Other Heart Sounds:Auscultation of the heart reveals- No Murmurs.  Abdomen Inspection:-Inspeection Normal. Palpation/Percussion:Note:No mass. Palpation and Percussion of the abdomen reveal- Non Tender, Non Distended + BS, no rebound or guarding.  Neurologic Cranial Nerve exam:-  CN III-XII intact(No nystagmus), symmetric smile. Strength:- 5/5 equal and symmetric strength both upper and lower extremities.       Assessment & Plan:   Patient Instructions  Htn- bp well controlled today. Continue amlodipine. Will get lipid panel today.  For migraine- continue imitrex.  For nocturnal seizure continue under tx per neurologist.  For renal cancer continue to follow up with Dr. Marin Olp.  For hx of smoking and sleep apnea. Placed referral to pulmonologist. Recommend smoking cessation. Specialist may do PFT.  Elevated sugar- get A1c today.   Screening psa today.  Chronic pain- on oxycodone.  Follow up date to be determined after lab review.    Mackie Pai, PA-C

## 2022-01-15 ENCOUNTER — Telehealth: Payer: Self-pay | Admitting: *Deleted

## 2022-01-15 NOTE — Telephone Encounter (Signed)
Received update from [facility] stating patient is scheduled for their In-Home Video EEG from 12/13-12/15, per request.

## 2022-01-18 ENCOUNTER — Other Ambulatory Visit: Payer: Self-pay | Admitting: Hematology & Oncology

## 2022-01-18 DIAGNOSIS — C642 Malignant neoplasm of left kidney, except renal pelvis: Secondary | ICD-10-CM

## 2022-01-23 ENCOUNTER — Other Ambulatory Visit: Payer: PRIVATE HEALTH INSURANCE

## 2022-02-08 ENCOUNTER — Other Ambulatory Visit: Payer: Self-pay | Admitting: Hematology & Oncology

## 2022-02-08 ENCOUNTER — Other Ambulatory Visit (HOSPITAL_BASED_OUTPATIENT_CLINIC_OR_DEPARTMENT_OTHER): Payer: Self-pay

## 2022-02-08 ENCOUNTER — Encounter: Payer: Self-pay | Admitting: Hematology & Oncology

## 2022-02-08 ENCOUNTER — Other Ambulatory Visit: Payer: Self-pay | Admitting: Medical Oncology

## 2022-02-08 MED ORDER — OXYCODONE HCL ER 15 MG PO T12A
15.0000 mg | EXTENDED_RELEASE_TABLET | Freq: Two times a day (BID) | ORAL | 0 refills | Status: DC
  Filled 2022-02-08: qty 34, 17d supply, fill #0

## 2022-02-09 ENCOUNTER — Encounter: Payer: Self-pay | Admitting: Medical

## 2022-02-09 ENCOUNTER — Other Ambulatory Visit (HOSPITAL_BASED_OUTPATIENT_CLINIC_OR_DEPARTMENT_OTHER): Payer: Self-pay

## 2022-02-09 MED ORDER — OXYCODONE HCL 10 MG PO TABS
10.0000 mg | ORAL_TABLET | ORAL | 0 refills | Status: DC | PRN
  Filled 2022-02-09: qty 90, 15d supply, fill #0

## 2022-02-09 NOTE — Addendum Note (Signed)
Addended by: Anabel Halon on: 02/09/2022 12:44 PM   Modules accepted: Orders

## 2022-02-12 ENCOUNTER — Encounter (HOSPITAL_COMMUNITY)
Admission: RE | Admit: 2022-02-12 | Discharge: 2022-02-12 | Disposition: A | Discharge status: Home / Self Care | Payer: PRIVATE HEALTH INSURANCE | Source: Ambulatory Visit | Attending: Hematology & Oncology | Admitting: Hematology & Oncology

## 2022-02-12 DIAGNOSIS — I700 Atherosclerosis of aorta: Secondary | ICD-10-CM | POA: Insufficient documentation

## 2022-02-12 DIAGNOSIS — C7951 Secondary malignant neoplasm of bone: Secondary | ICD-10-CM | POA: Insufficient documentation

## 2022-02-12 DIAGNOSIS — C642 Malignant neoplasm of left kidney, except renal pelvis: Secondary | ICD-10-CM | POA: Diagnosis not present

## 2022-02-12 LAB — GLUCOSE, CAPILLARY: Glucose-Capillary: 122 mg/dL — ABNORMAL HIGH (ref 70–99)

## 2022-02-12 MED ORDER — FLUDEOXYGLUCOSE F - 18 (FDG) INJECTION
10.0000 | Freq: Once | INTRAVENOUS | Status: AC | PRN
  Administered 2022-02-12: 10.67 via INTRAVENOUS

## 2022-02-13 ENCOUNTER — Telehealth: Payer: Self-pay

## 2022-02-13 ENCOUNTER — Ambulatory Visit
Admission: RE | Admit: 2022-02-13 | Discharge: 2022-02-13 | Disposition: A | Discharge status: Home / Self Care | Payer: BC Managed Care – PPO | Source: Ambulatory Visit | Attending: Hematology & Oncology | Admitting: Hematology & Oncology

## 2022-02-13 DIAGNOSIS — C642 Malignant neoplasm of left kidney, except renal pelvis: Secondary | ICD-10-CM

## 2022-02-13 MED ORDER — METHYLPREDNISOLONE ACETATE 40 MG/ML INJ SUSP (RADIOLOG
80.0000 mg | Freq: Once | INTRAMUSCULAR | Status: AC
  Administered 2022-02-13: 80 mg via EPIDURAL

## 2022-02-13 MED ORDER — IOPAMIDOL (ISOVUE-M 200) INJECTION 41%
1.0000 mL | Freq: Once | INTRAMUSCULAR | Status: AC
  Administered 2022-02-13: 1 mL via EPIDURAL

## 2022-02-13 NOTE — Telephone Encounter (Signed)
-----   Message from Volanda Napoleon, MD sent at 02/12/2022  5:15 PM EST ----- Call and let him know that the PET scan does not show any obvious active kidney cancer.  This is fantastic news.  Thanks.  Laurey Arrow

## 2022-02-13 NOTE — Discharge Instructions (Signed)

## 2022-02-14 NOTE — Addendum Note (Signed)
Addended by: Anabel Halon on: 02/14/2022 05:12 PM   Modules accepted: Orders

## 2022-02-16 ENCOUNTER — Other Ambulatory Visit (HOSPITAL_BASED_OUTPATIENT_CLINIC_OR_DEPARTMENT_OTHER): Payer: Self-pay

## 2022-02-16 ENCOUNTER — Inpatient Hospital Stay: Payer: BC Managed Care – PPO | Attending: Hematology & Oncology

## 2022-02-16 ENCOUNTER — Inpatient Hospital Stay: Payer: BC Managed Care – PPO

## 2022-02-16 ENCOUNTER — Encounter: Payer: Self-pay | Admitting: Hematology & Oncology

## 2022-02-16 ENCOUNTER — Inpatient Hospital Stay (HOSPITAL_BASED_OUTPATIENT_CLINIC_OR_DEPARTMENT_OTHER): Payer: BC Managed Care – PPO | Admitting: Hematology & Oncology

## 2022-02-16 VITALS — BP 142/66 | HR 72 | Temp 98.2°F | Resp 20 | Ht 67.0 in | Wt 213.0 lb

## 2022-02-16 DIAGNOSIS — C7951 Secondary malignant neoplasm of bone: Secondary | ICD-10-CM | POA: Insufficient documentation

## 2022-02-16 DIAGNOSIS — Z7982 Long term (current) use of aspirin: Secondary | ICD-10-CM | POA: Insufficient documentation

## 2022-02-16 DIAGNOSIS — G4733 Obstructive sleep apnea (adult) (pediatric): Secondary | ICD-10-CM

## 2022-02-16 DIAGNOSIS — Z79899 Other long term (current) drug therapy: Secondary | ICD-10-CM | POA: Diagnosis not present

## 2022-02-16 DIAGNOSIS — C642 Malignant neoplasm of left kidney, except renal pelvis: Secondary | ICD-10-CM | POA: Insufficient documentation

## 2022-02-16 DIAGNOSIS — Z5112 Encounter for antineoplastic immunotherapy: Secondary | ICD-10-CM | POA: Diagnosis not present

## 2022-02-16 DIAGNOSIS — E782 Mixed hyperlipidemia: Secondary | ICD-10-CM

## 2022-02-16 DIAGNOSIS — F17200 Nicotine dependence, unspecified, uncomplicated: Secondary | ICD-10-CM

## 2022-02-16 LAB — CBC WITH DIFFERENTIAL (CANCER CENTER ONLY)
Abs Immature Granulocytes: 0.05 10*3/uL (ref 0.00–0.07)
Basophils Absolute: 0 10*3/uL (ref 0.0–0.1)
Basophils Relative: 1 %
Eosinophils Absolute: 0.3 10*3/uL (ref 0.0–0.5)
Eosinophils Relative: 4 %
HCT: 44.3 % (ref 39.0–52.0)
Hemoglobin: 14.9 g/dL (ref 13.0–17.0)
Immature Granulocytes: 1 %
Lymphocytes Relative: 18 %
Lymphs Abs: 1.4 10*3/uL (ref 0.7–4.0)
MCH: 31 pg (ref 26.0–34.0)
MCHC: 33.6 g/dL (ref 30.0–36.0)
MCV: 92.1 fL (ref 80.0–100.0)
Monocytes Absolute: 0.5 10*3/uL (ref 0.1–1.0)
Monocytes Relative: 6 %
Neutro Abs: 5.7 10*3/uL (ref 1.7–7.7)
Neutrophils Relative %: 70 %
Platelet Count: 240 10*3/uL (ref 150–400)
RBC: 4.81 MIL/uL (ref 4.22–5.81)
RDW: 13.2 % (ref 11.5–15.5)
WBC Count: 8 10*3/uL (ref 4.0–10.5)
nRBC: 0 % (ref 0.0–0.2)

## 2022-02-16 LAB — TSH: TSH: 2.47 u[IU]/mL (ref 0.350–4.500)

## 2022-02-16 LAB — CMP (CANCER CENTER ONLY)
ALT: 15 U/L (ref 0–44)
AST: 11 U/L — ABNORMAL LOW (ref 15–41)
Albumin: 4.4 g/dL (ref 3.5–5.0)
Alkaline Phosphatase: 81 U/L (ref 38–126)
Anion gap: 8 (ref 5–15)
BUN: 20 mg/dL (ref 6–20)
CO2: 26 mmol/L (ref 22–32)
Calcium: 9.1 mg/dL (ref 8.9–10.3)
Chloride: 103 mmol/L (ref 98–111)
Creatinine: 1.12 mg/dL (ref 0.61–1.24)
GFR, Estimated: >60 mL/min (ref >60)
Glucose, Bld: 175 mg/dL — ABNORMAL HIGH (ref 70–99)
Potassium: 4.1 mmol/L (ref 3.5–5.1)
Sodium: 137 mmol/L (ref 135–145)
Total Bilirubin: 0.4 mg/dL (ref 0.3–1.2)
Total Protein: 7 g/dL (ref 6.5–8.1)

## 2022-02-16 LAB — LACTATE DEHYDROGENASE: LDH: 132 U/L (ref 98–192)

## 2022-02-16 MED ORDER — SODIUM CHLORIDE 0.9 % IV SOLN: Freq: Once | INTRAVENOUS | Status: AC

## 2022-02-16 MED ORDER — SODIUM CHLORIDE 0.9% FLUSH
10.0000 mL | INTRAVENOUS | Status: DC | PRN
  Administered 2022-02-16: 10 mL

## 2022-02-16 MED ORDER — HEPARIN SOD (PORK) LOCK FLUSH 100 UNIT/ML IV SOLN
500.0000 [IU] | Freq: Once | INTRAVENOUS | Status: AC | PRN
  Administered 2022-02-16: 500 [IU]

## 2022-02-16 MED ORDER — SODIUM CHLORIDE 0.9 % IV SOLN
480.0000 mg | Freq: Once | INTRAVENOUS | Status: AC
  Administered 2022-02-16: 480 mg via INTRAVENOUS
  Filled 2022-02-16: qty 48

## 2022-02-16 NOTE — Progress Notes (Signed)
Hematology and Oncology Follow Up Visit  Emad Brechtel [id number] [birth year] 57 y.o. 02/16/2022   Principle Diagnosis:  Metastatic Renal Cell Carcinoma - bone mets  Current Therapy:   Nivoumab/Ipilimumab -- s/p cycle #5 -- started on 09/29/2019 -- maintenance Nivolumab q 2 month -- Started on 12/29/2019 Xgeva 120 mg sq q 3 months -- next dose in 04/2022 XRT to right iliac mass/ L5 vertebral body/ Left femoral neck LEFT hip repair Cryoablation of left kidney mass-10/19/2020     Interim History:  Mr. Vincent David is back for follow-up.  So far, he is doing fairly well.  We did do a PET scan on him.  This was done on 02/12/2022.  Does not show any evidence of active renal cell carcinoma.  He did have the screw in the left hip.  He did have some spine degeneration.  His main problem continues to be the discomfort that he has is left hip area.  I suspect this probably is just "wear and tear" from his surgery.  He has had no problems going to the bathroom.  He has had no problems with cough or shortness of breath.  He has had no fever.  He has had no problems with COVID or Influenza.  There is no leg swelling.  He has had no leg weakness.  He has had no cough or shortness of breath.  Overall, I would have to say that his performance status is ECOG 1.      Medications:  Current Outpatient Medications:    amLODipine (NORVASC) 5 MG tablet, TAKE 1 TABLET EVERY DAY, Disp: 90 tablet, Rfl: 4   aspirin EC 81 MG tablet, Take 1 tablet (81 mg total) by mouth 2 (two) times daily., Disp: 60 tablet, Rfl: 0   cloBAZam (ONFI) 10 MG tablet, Take 1 tablet (10 mg total) by mouth at bedtime., Disp: 30 tablet, Rfl: 5   ibuprofen (ADVIL) 200 MG tablet, Take 400 mg by mouth every 8 (eight) hours as needed (pain.)., Disp: , Rfl:    lamoTRIgine (LAMICTAL) 100 MG tablet, Take 2.5 tablets (250 mg total) by mouth 2 (two) times daily., Disp: 450 tablet, Rfl: 0   oxyCODONE (OXYCONTIN) 15 mg 12 hr tablet, Take 1 tablet (15 mg  total) by mouth every 12 (twelve) hours., Disp: 34 tablet, Rfl: 0   Oxycodone HCl 10 MG TABS, Take 1 tablet (10 mg total) by mouth every 4 (four) hours as needed., Disp: 90 tablet, Rfl: 0   SUMAtriptan (IMITREX) 50 MG tablet, Take 50 mg by mouth every 2 (two) hours as needed for migraine. May repeat in 2 hours if headache persists or recurs., Disp: , Rfl:  No current facility-administered medications for this visit.  Facility-Administered Medications Ordered in Other Visits:    sodium chloride flush (NS) 0.9 % injection 10 mL, 10 mL, Intracatheter, PRN, Volanda Napoleon, MD, 10 mL at 05/19/20 1343  Allergies: No Known Allergies  Past Medical History, Surgical history, Social history, and Family History were reviewed and updated.  Review of Systems: Review of Systems  Constitutional: Negative.   HENT:  Negative.    Eyes: Negative.   Respiratory: Negative.    Cardiovascular: Negative.   Gastrointestinal: Negative.   Endocrine: Negative.   Musculoskeletal:  Positive for back pain and flank pain.  Skin: Negative.   Neurological: Negative.   Hematological: Negative.   Psychiatric/Behavioral: Negative.      Physical Exam:  height is '5\' 7"'$  (1.702 m) and weight is 213 lb (96.6 kg).  His oral temperature is 98.2 F (36.8 C). His blood pressure is 142/66 (abnormal) and his pulse is 72. His respiration is 20 and oxygen saturation is 97%.   Wt Readings from Last 3 Encounters:  02/16/22 213 lb (96.6 kg)  01/09/22 214 lb (97.1 kg)  01/05/22 217 lb (98.4 kg)    Physical Exam Vitals reviewed.  HENT:     Head: Normocephalic and atraumatic.  Eyes:     Pupils: Pupils are equal, round, and reactive to light.  Cardiovascular:     Rate and Rhythm: Normal rate and regular rhythm.     Heart sounds: Normal heart sounds.  Pulmonary:     Effort: Pulmonary effort is normal.     Breath sounds: Normal breath sounds.  Abdominal:     General: Bowel sounds are normal.     Palpations: Abdomen is  soft.  Musculoskeletal:        General: No tenderness or deformity. Normal range of motion.     Cervical back: Normal range of motion.  Lymphadenopathy:     Cervical: No cervical adenopathy.  Skin:    General: Skin is warm and dry.     Findings: No erythema or rash.  Neurological:     Mental Status: He is alert and oriented to person, place, and time.  Psychiatric:        Behavior: Behavior normal.        Thought Content: Thought content normal.        Judgment: Judgment normal.     Lab Results  Component Value Date   WBC 8.0 02/16/2022   HGB 14.9 02/16/2022   HCT 44.3 02/16/2022   MCV 92.1 02/16/2022   PLT 240 02/16/2022     Chemistry      Component Value Date/Time   NA 137 02/16/2022 0820   K 4.1 02/16/2022 0820   CL 103 02/16/2022 0820   CO2 26 02/16/2022 0820   BUN 20 02/16/2022 0820   CREATININE 1.12 02/16/2022 0820   CREATININE 1.15 08/25/2019 1450      Component Value Date/Time   CALCIUM 9.1 02/16/2022 0820   ALKPHOS 81 02/16/2022 0820   AST 11 (L) 02/16/2022 0820   ALT 15 02/16/2022 0820   BILITOT 0.4 02/16/2022 0820      Impression and Plan: Mr. Rufener is a very nice 57 year old white male.  He has metastatic renal cell carcinoma.  For some reason, his disease clearly favors his bones.  His lungs and liver and lymph nodes all appear to be clean.  At this point, it is clear that the immunotherapy is working quite nicely.  We will now move his treatments out every 2 months.  Hopefully, if he continues to do well, we may treatments every 3 months.  I am just happy that his quality life is doing well right now.  I will have to speak with his Orthopedic Surgeon to see if there is any issues with respect to the left hip.  He will come back in 2 months for his next treatment.  When he comes back, he will get his Xgeva.    1/12/20249:02 AM

## 2022-02-16 NOTE — Patient Instructions (Signed)
[facility]  Discharge Instructions: Thank you for choosing [facility] to provide your oncology and hematology care.   If you have a lab appointment with the [facility], please go directly to the [facility] and check in at the registration area.  Wear comfortable clothing and clothing appropriate for easy access to any Portacath or PICC line.   We strive to give you quality time with your provider. You may need to reschedule your appointment if you arrive late (15 or more minutes).  Arriving late affects you and other patients whose appointments are after yours.  Also, if you miss three or more appointments without notifying the office, you may be dismissed from the clinic at the provider's discretion.      For prescription refill requests, have your pharmacy contact our office and allow 72 hours for refills to be completed.    Today you received the following chemotherapy and/or immunotherapy agents Opdivo.      To help prevent nausea and vomiting after your treatment, we encourage you to take your nausea medication as directed.  BELOW ARE SYMPTOMS THAT SHOULD BE REPORTED IMMEDIATELY: *FEVER GREATER THAN 100.4 F (38 C) OR HIGHER *CHILLS OR SWEATING *NAUSEA AND VOMITING THAT IS NOT CONTROLLED WITH YOUR NAUSEA MEDICATION *UNUSUAL SHORTNESS OF BREATH *UNUSUAL BRUISING OR BLEEDING *URINARY PROBLEMS (pain or burning when urinating, or frequent urination) *BOWEL PROBLEMS (unusual diarrhea, constipation, pain near the anus) TENDERNESS IN MOUTH AND THROAT WITH OR WITHOUT PRESENCE OF ULCERS (sore throat, sores in mouth, or a toothache) UNUSUAL RASH, SWELLING OR PAIN  UNUSUAL VAGINAL DISCHARGE OR ITCHING   Items with * indicate a potential emergency and should be followed up as soon as possible or go to the Emergency Department if any problems should occur.  Please show the CHEMOTHERAPY ALERT CARD or IMMUNOTHERAPY ALERT CARD at check-in to the  Emergency Department and triage nurse. Should you have questions after your visit or need to cancel or reschedule your appointment, please contact [facility]  [phone] and follow the prompts.  Office hours are 8:00 a.m. to 4:30 p.m. Monday - Friday. Please note that voicemails left after 4:00 p.m. may not be returned until the following business day.  We are closed weekends and major holidays. You have access to a nurse at all times for urgent questions. Please call the main number to the clinic [phone] and follow the prompts.  For any non-urgent questions, you may also contact your provider using MyChart. We now offer e-Visits for anyone 36 and older to request care online for non-urgent symptoms. For details visit [email or web address].   Also download the MyChart app! Go to the app store, search "MyChart", open the app, select , and log in with your MyChart username and password.

## 2022-02-16 NOTE — Patient Instructions (Signed)

## 2022-02-19 ENCOUNTER — Other Ambulatory Visit: Payer: Self-pay | Admitting: Pharmacist

## 2022-02-20 ENCOUNTER — Ambulatory Visit (INDEPENDENT_AMBULATORY_CARE_PROVIDER_SITE_OTHER): Payer: PRIVATE HEALTH INSURANCE | Admitting: Internal Medicine

## 2022-02-20 DIAGNOSIS — F17200 Nicotine dependence, unspecified, uncomplicated: Secondary | ICD-10-CM | POA: Diagnosis not present

## 2022-02-20 DIAGNOSIS — G4733 Obstructive sleep apnea (adult) (pediatric): Secondary | ICD-10-CM

## 2022-02-20 LAB — PULMONARY FUNCTION TEST
DL/VA % pred: 78 %
DL/VA: 3.38 ml/min/mmHg/L
DLCO cor % pred: 71 %
DLCO cor: 18.24 ml/min/mmHg
DLCO unc % pred: 72 %
DLCO unc: 18.39 ml/min/mmHg
FEF 25-75 Post: 1.78 L/sec
FEF 25-75 Pre: 1.33 L/sec
FEF2575-%Change-Post: 33 %
FEF2575-%Pred-Post: 63 %
FEF2575-%Pred-Pre: 47 %
FEV1-%Change-Post: 13 %
FEV1-%Pred-Post: 74 %
FEV1-%Pred-Pre: 65 %
FEV1-Post: 2.46 L
FEV1-Pre: 2.17 L
FEV1FVC-%Change-Post: 10 %
FEV1FVC-%Pred-Pre: 82 %
FEV6-%Change-Post: 4 %
FEV6-%Pred-Post: 85 %
FEV6-%Pred-Pre: 81 %
FEV6-Post: 3.52 L
FEV6-Pre: 3.36 L
FEV6FVC-%Change-Post: 1 %
FEV6FVC-%Pred-Post: 103 %
FEV6FVC-%Pred-Pre: 102 %
FVC-%Change-Post: 2 %
FVC-%Pred-Post: 81 %
FVC-%Pred-Pre: 79 %
FVC-Post: 3.55 L
FVC-Pre: 3.45 L
Post FEV1/FVC ratio: 69 %
Post FEV6/FVC ratio: 99 %
Pre FEV1/FVC ratio: 63 %
Pre FEV6/FVC Ratio: 97 %
RV % pred: 124 %
RV: 2.54 L
TLC % pred: 96 %
TLC: 6.14 L

## 2022-02-20 NOTE — Progress Notes (Signed)
Full PFT Performed Today  

## 2022-02-20 NOTE — Patient Instructions (Signed)
Full PFT Performed Today  

## 2022-02-22 ENCOUNTER — Encounter: Payer: Self-pay | Admitting: Medical

## 2022-03-07 ENCOUNTER — Other Ambulatory Visit: Payer: Self-pay | Admitting: Neurology

## 2022-03-13 ENCOUNTER — Telehealth (HOSPITAL_BASED_OUTPATIENT_CLINIC_OR_DEPARTMENT_OTHER): Payer: Self-pay | Admitting: Pulmonary Disease

## 2022-03-15 ENCOUNTER — Encounter (HOSPITAL_BASED_OUTPATIENT_CLINIC_OR_DEPARTMENT_OTHER): Payer: Self-pay | Admitting: Pulmonary Disease

## 2022-03-15 ENCOUNTER — Telehealth: Payer: Self-pay | Admitting: Pulmonary Disease

## 2022-03-15 ENCOUNTER — Ambulatory Visit (INDEPENDENT_AMBULATORY_CARE_PROVIDER_SITE_OTHER): Payer: BC Managed Care – PPO | Admitting: Pulmonary Disease

## 2022-03-15 VITALS — BP 124/68 | HR 92 | Ht 67.0 in | Wt 213.4 lb

## 2022-03-15 DIAGNOSIS — J432 Centrilobular emphysema: Secondary | ICD-10-CM

## 2022-03-15 DIAGNOSIS — Z720 Tobacco use: Secondary | ICD-10-CM | POA: Diagnosis not present

## 2022-03-15 DIAGNOSIS — J4489 Other specified chronic obstructive pulmonary disease: Secondary | ICD-10-CM | POA: Diagnosis not present
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
## 2022-03-15 NOTE — Telephone Encounter (Signed)
Pt had office visit today (03/15/22) paperwork signed sent to fmla team
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
## 2022-03-15 NOTE — Patient Instructions (Signed)
Follow up as needed

## 2022-03-26 ENCOUNTER — Telehealth (HOSPITAL_BASED_OUTPATIENT_CLINIC_OR_DEPARTMENT_OTHER): Payer: Self-pay | Admitting: Pulmonary Disease

## 2022-03-29 NOTE — Telephone Encounter (Signed)
Faxed full PFT report on pt to the number provided and called and left detailed vm that this was done. Received fax confirmation. Closing encounter.

## 2022-04-16 ENCOUNTER — Other Ambulatory Visit: Payer: Self-pay | Admitting: Medical Oncology

## 2022-04-17 ENCOUNTER — Other Ambulatory Visit (HOSPITAL_BASED_OUTPATIENT_CLINIC_OR_DEPARTMENT_OTHER): Payer: Self-pay

## 2022-04-17 ENCOUNTER — Encounter: Payer: Self-pay | Admitting: Hematology & Oncology

## 2022-04-17 ENCOUNTER — Inpatient Hospital Stay: Payer: BC Managed Care – PPO | Attending: Hematology & Oncology

## 2022-04-17 ENCOUNTER — Inpatient Hospital Stay: Payer: BC Managed Care – PPO

## 2022-04-17 ENCOUNTER — Inpatient Hospital Stay (HOSPITAL_BASED_OUTPATIENT_CLINIC_OR_DEPARTMENT_OTHER): Payer: BC Managed Care – PPO | Admitting: Hematology & Oncology

## 2022-04-17 ENCOUNTER — Ambulatory Visit (HOSPITAL_BASED_OUTPATIENT_CLINIC_OR_DEPARTMENT_OTHER)
Admission: RE | Admit: 2022-04-17 | Discharge: 2022-04-17 | Disposition: A | Discharge status: Home / Self Care | Payer: 59 | Source: Ambulatory Visit | Attending: Hematology & Oncology | Admitting: Hematology & Oncology

## 2022-04-17 VITALS — BP 140/68 | HR 81 | Temp 98.2°F | Resp 17 | Ht 67.0 in | Wt 212.0 lb

## 2022-04-17 DIAGNOSIS — C7951 Secondary malignant neoplasm of bone: Secondary | ICD-10-CM | POA: Diagnosis not present

## 2022-04-17 DIAGNOSIS — Z5112 Encounter for antineoplastic immunotherapy: Secondary | ICD-10-CM | POA: Insufficient documentation

## 2022-04-17 DIAGNOSIS — Z7982 Long term (current) use of aspirin: Secondary | ICD-10-CM | POA: Diagnosis not present

## 2022-04-17 DIAGNOSIS — M25552 Pain in left hip: Secondary | ICD-10-CM | POA: Insufficient documentation

## 2022-04-17 DIAGNOSIS — Z9189 Other specified personal risk factors, not elsewhere classified: Secondary | ICD-10-CM

## 2022-04-17 DIAGNOSIS — C642 Malignant neoplasm of left kidney, except renal pelvis: Secondary | ICD-10-CM

## 2022-04-17 DIAGNOSIS — Z79899 Other long term (current) drug therapy: Secondary | ICD-10-CM | POA: Insufficient documentation

## 2022-04-17 LAB — CBC WITH DIFFERENTIAL (CANCER CENTER ONLY)
Abs Immature Granulocytes: 0.1 10*3/uL — ABNORMAL HIGH (ref 0.00–0.07)
Basophils Absolute: 0 10*3/uL (ref 0.0–0.1)
Basophils Relative: 1 %
Eosinophils Absolute: 0.4 10*3/uL (ref 0.0–0.5)
Eosinophils Relative: 6 %
HCT: 43.6 % (ref 39.0–52.0)
Hemoglobin: 14.6 g/dL (ref 13.0–17.0)
Immature Granulocytes: 2 %
Lymphocytes Relative: 16 %
Lymphs Abs: 1.1 10*3/uL (ref 0.7–4.0)
MCH: 30.5 pg (ref 26.0–34.0)
MCHC: 33.5 g/dL (ref 30.0–36.0)
MCV: 91.2 fL (ref 80.0–100.0)
Monocytes Absolute: 0.4 10*3/uL (ref 0.1–1.0)
Monocytes Relative: 6 %
Neutro Abs: 4.6 10*3/uL (ref 1.7–7.7)
Neutrophils Relative %: 69 %
Platelet Count: 217 10*3/uL (ref 150–400)
RBC: 4.78 MIL/uL (ref 4.22–5.81)
RDW: 13.6 % (ref 11.5–15.5)
WBC Count: 6.6 10*3/uL (ref 4.0–10.5)
nRBC: 0 % (ref 0.0–0.2)

## 2022-04-17 LAB — CMP (CANCER CENTER ONLY)
ALT: 16 U/L (ref 0–44)
AST: 16 U/L (ref 15–41)
Albumin: 4.3 g/dL (ref 3.5–5.0)
Alkaline Phosphatase: 102 U/L (ref 38–126)
Anion gap: 7 (ref 5–15)
BUN: 16 mg/dL (ref 6–20)
CO2: 26 mmol/L (ref 22–32)
Calcium: 8.8 mg/dL — ABNORMAL LOW (ref 8.9–10.3)
Chloride: 105 mmol/L (ref 98–111)
Creatinine: 1.08 mg/dL (ref 0.61–1.24)
GFR, Estimated: >60 mL/min (ref >60)
Glucose, Bld: 103 mg/dL — ABNORMAL HIGH (ref 70–99)
Potassium: 4.3 mmol/L (ref 3.5–5.1)
Sodium: 138 mmol/L (ref 135–145)
Total Bilirubin: 0.4 mg/dL (ref 0.3–1.2)
Total Protein: 6.7 g/dL (ref 6.5–8.1)

## 2022-04-17 LAB — LACTATE DEHYDROGENASE: LDH: 169 U/L (ref 98–192)

## 2022-04-17 MED ORDER — HEPARIN SOD (PORK) LOCK FLUSH 100 UNIT/ML IV SOLN
500.0000 [IU] | Freq: Once | INTRAVENOUS | Status: AC | PRN
  Administered 2022-04-17: 500 [IU]

## 2022-04-17 MED ORDER — SODIUM CHLORIDE 0.9 % IV SOLN
480.0000 mg | Freq: Once | INTRAVENOUS | Status: AC
  Administered 2022-04-17: 480 mg via INTRAVENOUS
  Filled 2022-04-17: qty 48

## 2022-04-17 MED ORDER — SODIUM CHLORIDE 0.9% FLUSH
10.0000 mL | INTRAVENOUS | Status: DC | PRN
  Administered 2022-04-17: 10 mL

## 2022-04-17 MED ORDER — DENOSUMAB 120 MG/1.7ML ~~LOC~~ SOLN
120.0000 mg | Freq: Once | SUBCUTANEOUS | Status: AC
  Administered 2022-04-17: 120 mg via SUBCUTANEOUS
  Filled 2022-04-17: qty 1.7

## 2022-04-17 MED ORDER — SODIUM CHLORIDE 0.9 % IV SOLN: Freq: Once | INTRAVENOUS | Status: AC

## 2022-04-17 NOTE — Patient Instructions (Signed)
[facility]  Discharge Instructions: Thank you for choosing [facility] to provide your oncology and hematology care.   If you have a lab appointment with the Cancer Center, please go directly to the Cancer Center and check in at the registration area.  Wear comfortable clothing and clothing appropriate for easy access to any Portacath or PICC line.   We strive to give you quality time with your provider. You may need to reschedule your appointment if you arrive late (15 or more minutes).  Arriving late affects you and other patients whose appointments are after yours.  Also, if you miss three or more appointments without notifying the office, you may be dismissed from the clinic at the provider's discretion.      For prescription refill requests, have your pharmacy contact our office and allow 72 hours for refills to be completed.    Today you received the following chemotherapy and/or immunotherapy agents:  Nivolumab      To help prevent nausea and vomiting after your treatment, we encourage you to take your nausea medication as directed.  BELOW ARE SYMPTOMS THAT SHOULD BE REPORTED IMMEDIATELY: *FEVER GREATER THAN 100.4 F (38 C) OR HIGHER *CHILLS OR SWEATING *NAUSEA AND VOMITING THAT IS NOT CONTROLLED WITH YOUR NAUSEA MEDICATION *UNUSUAL SHORTNESS OF BREATH *UNUSUAL BRUISING OR BLEEDING *URINARY PROBLEMS (pain or burning when urinating, or frequent urination) *BOWEL PROBLEMS (unusual diarrhea, constipation, pain near the anus) TENDERNESS IN MOUTH AND THROAT WITH OR WITHOUT PRESENCE OF ULCERS (sore throat, sores in mouth, or a toothache) UNUSUAL RASH, SWELLING OR PAIN  UNUSUAL VAGINAL DISCHARGE OR ITCHING   Items with * indicate a potential emergency and should be followed up as soon as possible or go to the Emergency Department if any problems should occur.  Please show the CHEMOTHERAPY ALERT CARD or IMMUNOTHERAPY ALERT CARD at  check-in to the Emergency Department and triage nurse. Should you have questions after your visit or need to cancel or reschedule your appointment, please contact [facility]  [phone] and follow the prompts.  Office hours are 8:00 a.m. to 4:30 p.m. Monday - Friday. Please note that voicemails left after 4:00 p.m. may not be returned until the following business day.  We are closed weekends and major holidays. You have access to a nurse at all times for urgent questions. Please call the main number to the clinic [phone] and follow the prompts.  For any non-urgent questions, you may also contact your provider using MyChart. We now offer e-Visits for anyone 18 and older to request care online for non-urgent symptoms. For details visit [email or web address].   Also download the MyChart app! Go to the app store, search "MyChart", open the app, select [facility], and log in with your MyChart username and password.   

## 2022-04-17 NOTE — Patient Instructions (Signed)

## 2022-04-17 NOTE — Progress Notes (Signed)
Hematology and Oncology Follow Up Visit  Vincent David PI:[id number] 1965/07/07 57 y.o. 04/17/2022   Principle Diagnosis:  Metastatic Renal Cell Carcinoma - bone mets  Current Therapy:   Nivoumab/Ipilimumab -- s/p cycle #5 -- started on 09/29/2019 -- maintenance Nivolumab q 2 month -- Started on 12/29/2019 Xgeva 120 mg sq q 3 months -- next dose in 06/2022 XRT to right iliac mass/ L5 vertebral body/ Left femoral neck LEFT hip repair Cryoablation of left kidney mass-10/19/2020     Interim History:  Vincent David is back for follow-up.  His main complaint has been some increasing pain over the left hip.  This is the hip that he had surgery on for metastatic disease.  We will go ahead and get a plain film of that area to see if there is anything going on.  I told him that he could always go back to see the Orthopedic Surgeon who did his surgery.  Otherwise, he is doing okay.  He and his wife will be going up to [place] I think it may for a wedding.  He has had no problems with cough or shortness of breath.  His appetite has been pretty good.  He has had no problems with bowels or bladder.  He has had no diarrhea.  There is been no rashes.  He has had no bleeding.  There is been no leg swelling.  Overall, I would have said that his performance status is probably ECOG 1.    Medications:  Current Outpatient Medications:    amLODipine (NORVASC) 5 MG tablet, TAKE 1 TABLET EVERY DAY, Disp: 90 tablet, Rfl: 4   aspirin EC 81 MG tablet, Take 1 tablet (81 mg total) by mouth 2 (two) times daily., Disp: 60 tablet, Rfl: 0   cloBAZam (ONFI) 10 MG tablet, Take 1 tablet (10 mg total) by mouth at bedtime., Disp: 30 tablet, Rfl: 5   ibuprofen (ADVIL) 200 MG tablet, Take 400 mg by mouth every 8 (eight) hours as needed (pain.)., Disp: , Rfl:    lamoTRIgine (LAMICTAL) 100 MG tablet, TAKE 2.5 TABLETS (250 MG TOTAL) BY MOUTH 2 (TWO) TIMES DAILY., Disp: 450 tablet, Rfl: 0   oxyCODONE (OXYCONTIN) 15 mg 12 hr tablet,  Take 1 tablet (15 mg total) by mouth every 12 (twelve) hours., Disp: 34 tablet, Rfl: 0   Oxycodone HCl 10 MG TABS, Take 1 tablet (10 mg total) by mouth every 4 (four) hours as needed., Disp: 90 tablet, Rfl: 0   SUMAtriptan (IMITREX) 50 MG tablet, Take 50 mg by mouth every 2 (two) hours as needed for migraine. May repeat in 2 hours if headache persists or recurs., Disp: , Rfl:  No current facility-administered medications for this visit.  Facility-Administered Medications Ordered in Other Visits:    sodium chloride flush (NS) 0.9 % injection 10 mL, 10 mL, Intracatheter, PRN, Volanda Napoleon, MD, 10 mL at 05/19/20 1343  Allergies: No Known Allergies  Past Medical History, Surgical history, Social history, and Family History were reviewed and updated.  Review of Systems: Review of Systems  Constitutional: Negative.   HENT:  Negative.    Eyes: Negative.   Respiratory: Negative.    Cardiovascular: Negative.   Gastrointestinal: Negative.   Endocrine: Negative.   Musculoskeletal:  Positive for back pain and flank pain.  Skin: Negative.   Neurological: Negative.   Hematological: Negative.   Psychiatric/Behavioral: Negative.      Physical Exam:  height is '5\' 7"'$  (1.702 m) and weight is 212 lb (  96.2 kg). His oral temperature is 98.2 F (36.8 C). His blood pressure is 140/68 (abnormal) and his pulse is 81. His respiration is 17 and oxygen saturation is 98%.   Wt Readings from Last 3 Encounters:  04/17/22 212 lb (96.2 kg)  03/15/22 213 lb 6.4 oz (96.8 kg)  02/16/22 213 lb (96.6 kg)    Physical Exam Vitals reviewed.  HENT:     Head: Normocephalic and atraumatic.  Eyes:     Pupils: Pupils are equal, round, and reactive to light.  Cardiovascular:     Rate and Rhythm: Normal rate and regular rhythm.     Heart sounds: Normal heart sounds.  Pulmonary:     Effort: Pulmonary effort is normal.     Breath sounds: Normal breath sounds.  Abdominal:     General: Bowel sounds are normal.      Palpations: Abdomen is soft.  Musculoskeletal:        General: No tenderness or deformity. Normal range of motion.     Cervical back: Normal range of motion.  Lymphadenopathy:     Cervical: No cervical adenopathy.  Skin:    General: Skin is warm and dry.     Findings: No erythema or rash.  Neurological:     Mental Status: He is alert and oriented to person, place, and time.  Psychiatric:        Behavior: Behavior normal.        Thought Content: Thought content normal.        Judgment: Judgment normal.      Lab Results  Component Value Date   WBC 6.6 04/17/2022   HGB 14.6 04/17/2022   HCT 43.6 04/17/2022   MCV 91.2 04/17/2022   PLT 217 04/17/2022     Chemistry      Component Value Date/Time   NA 137 02/16/2022 0820   K 4.1 02/16/2022 0820   CL 103 02/16/2022 0820   CO2 26 02/16/2022 0820   BUN 20 02/16/2022 0820   CREATININE 1.12 02/16/2022 0820   CREATININE 1.15 08/25/2019 1450      Component Value Date/Time   CALCIUM 9.1 02/16/2022 0820   ALKPHOS 81 02/16/2022 0820   AST 11 (L) 02/16/2022 0820   ALT 15 02/16/2022 0820   BILITOT 0.4 02/16/2022 0820      Impression and Plan: Vincent David is a very nice 57 year old white male.  He has metastatic renal cell carcinoma.  For some reason, his disease clearly favors his bones.  His lungs and liver and lymph nodes all appear to be clean.  At this point, it is clear that the immunotherapy is working quite nicely.  We will set him up with another PET scan in May.  We will see what the x-ray shows of his left hip.  He will get his Delton See today.  Will plan to see him back in May after his PET scan is done.     3/12/20248:43 AM

## 2022-04-19 ENCOUNTER — Other Ambulatory Visit: Payer: Self-pay | Admitting: *Deleted

## 2022-04-19 ENCOUNTER — Encounter: Payer: Self-pay | Admitting: Neurology

## 2022-04-19 ENCOUNTER — Ambulatory Visit (INDEPENDENT_AMBULATORY_CARE_PROVIDER_SITE_OTHER): Payer: BC Managed Care – PPO | Admitting: Neurology

## 2022-04-19 ENCOUNTER — Encounter: Payer: Self-pay | Admitting: Hematology & Oncology

## 2022-04-19 ENCOUNTER — Other Ambulatory Visit (HOSPITAL_BASED_OUTPATIENT_CLINIC_OR_DEPARTMENT_OTHER): Payer: Self-pay

## 2022-04-19 VITALS — BP 145/84 | HR 86 | Ht 67.0 in | Wt 212.0 lb

## 2022-04-19 DIAGNOSIS — Z5181 Encounter for therapeutic drug level monitoring: Secondary | ICD-10-CM | POA: Diagnosis not present

## 2022-04-19 DIAGNOSIS — R569 Unspecified convulsions: Secondary | ICD-10-CM

## 2022-04-19 MED ORDER — OXYCODONE HCL 10 MG PO TABS
10.0000 mg | ORAL_TABLET | ORAL | 0 refills | Status: DC | PRN
  Filled 2022-04-19: qty 90, 15d supply, fill #0

## 2022-04-19 NOTE — Patient Instructions (Addendum)
250 mg twice daily Will continue with clobazam 10 mg nightly Continue with your other medications Follow-up in 6 months with NP [name]

## 2022-04-19 NOTE — Progress Notes (Signed)
[facility]  PATIENT: Vincent David DOB: 1965/10/21  REQUESTING CLINICIAN: Emeterio Reeve, DO HISTORY FROM: Patient and spouse  REASON FOR VISIT: Seizure disorder    HISTORICAL  CHIEF COMPLAINT:  Chief Complaint  Patient presents with   Follow-up    Rm 14, alone Doing well on medications, no seizures    INTERVAL HISTORY 04/19/2022 [name] presents today for follow-up, last visit was in November, at that time we started him on clobazam for his nocturnal seizures.  Since starting the medication, he has not had any additional seizures.  He tolerates the medication very well, denies any side effects.  Stated his sleep is much better.  Overall he is doing very well, no other complaints.  Unfortunately due to scheduling conflict, he was not able to complete his ambulatory EEG. Still having occasional headaches, uses Tylenol. He has not started the Imitrex yet.    HISTORY OF PRESENT ILLNESS:  This is a 57 year old gentleman past medical history of seizure disorder, recently diagnosed with kidney cancer, hypertension, sleep apnea, migraine headaches who is presenting for management of his epilepsy.  In terms of the seizure patient reports that he was diagnosed 12 years ago.  His seizures are described as generalized convulsion that only occurred during sleep, mainly during sleep at night but he did have seizures also while taking naps during the day.  His current frequency is about 1 seizure every 1 to 60-month.  With the seizures, he has tongue biting, urinary incontinence and he also fell off bed.  Denies any major injury.  Currently he is on lamotrigine 250 mg twice daily which was recently increased from 150 mg twice daily due to recent seizure.  He reports compliance with this medication. He thinks his only seizure trigger is his lack of sleep, due to his cancer and pain he has difficulty falling asleep and staying asleep.   Handedness: Right handed   Onset: 12 years  ago   Seizure Type: Generalized convulsion, occurs during sleep   Current frequency: About once every 1 to 2 months   Any injuries from seizures: Tongue biting, falling off the bed   Seizure risk factors: TBI  Previous ASMs: Lamotrigine   Currenty ASMs: Lamotrigine 250 mg twice daily, Clobazam 10 mg nightly   ASMs side effects: Denies   Brain Images: Normal EEG   Previous EEGs: Normal Brain MRI   Patient also reports a long history of migraine headaches.  He reports that his migraines are associated with auras and the auras described as constriction of his vision field. His vision will turn gray and constricted then followed by headaches.  When he have the headaches, he can last up to 2 days.  These are episodic.  He is currently not on any preventive or abortive medication.  OTHER MEDICAL CONDITIONS: Kidney cancer, hypertension, sleep apnea, seizure disorder, migraine headaches  REVIEW OF SYSTEMS: Full 14 system review of systems performed and negative with exception of: As noted in the HPI   ALLERGIES: No Known Allergies  HOME MEDICATIONS: Outpatient Medications Prior to Visit  Medication Sig Dispense Refill   amLODipine (NORVASC) 5 MG tablet TAKE 1 TABLET EVERY DAY 90 tablet 4   aspirin EC 81 MG tablet Take 1 tablet (81 mg total) by mouth 2 (two) times daily. 60 tablet 0   cloBAZam (ONFI) 10 MG tablet Take 1 tablet (10 mg total) by mouth at bedtime. 30 tablet 5   ibuprofen (ADVIL) 200 MG tablet Take 400 mg by mouth  every 8 (eight) hours as needed (pain.).     lamoTRIgine (LAMICTAL) 100 MG tablet TAKE 2.5 TABLETS (250 MG TOTAL) BY MOUTH 2 (TWO) TIMES DAILY. 450 tablet 0   oxyCODONE (OXYCONTIN) 15 mg 12 hr tablet Take 1 tablet (15 mg total) by mouth every 12 (twelve) hours. 34 tablet 0   Oxycodone HCl 10 MG TABS Take 1 tablet (10 mg total) by mouth every 4 (four) hours as needed. 90 tablet 0   SUMAtriptan (IMITREX) 50 MG tablet Take 50 mg by mouth every 2 (two) hours as needed  for migraine. May repeat in 2 hours if headache persists or recurs.     Facility-Administered Medications Prior to Visit  Medication Dose Route Frequency Provider Last Rate Last Admin   sodium chloride flush (NS) 0.9 % injection 10 mL  10 mL Intracatheter PRN Volanda Napoleon, MD   10 mL at 05/19/20 1343    PAST MEDICAL HISTORY: Past Medical History:  Diagnosis Date   Cancer [facility]    Cancer of kidney ([place])    Goals of care, counseling/discussion 08/28/2019   History of COVID-19 10/03/2020   Positive COVID swab test   Postictal headache    Seizure ([place])    Sleep apnea    Tinnitus     PAST SURGICAL HISTORY: Past Surgical History:  Procedure Laterality Date   IR IMAGING GUIDED PORT INSERTION  09/28/2019   IR RADIOLOGIST EVAL & MGMT  08/30/2020   IR RADIOLOGIST EVAL & MGMT  12/01/2020   IR RADIOLOGIST EVAL & MGMT  06/12/2021   IR RADIOLOGIST EVAL & MGMT  06/27/2021   IR RADIOLOGIST EVAL & MGMT  11/10/2021   KNEE SURGERY Left    ORIF PELVIC FRACTURE Left 09/14/2019   Procedure: OPEN REDUCTION INTERNAL FIXATION (ORIF) LEFT HIP WITH AFFIXUS NAIL;  Surgeon: Frederik Pear, MD;  Location: [facility];  Service: Orthopedics;  Laterality: Left;   RADIOLOGY WITH ANESTHESIA N/A 10/19/2020   Procedure: CT CYROABLATION WITH ANESTHESIA;  Surgeon: Criselda Peaches, MD;  Location: [facility];  Service: Radiology;  Laterality: N/A;   SHOULDER SURGERY Left     FAMILY HISTORY: Family History  Problem Relation Age of Onset   Sleep apnea Mother    Diabetes Mother    High blood pressure Father    Cancer Maternal Uncle        type unknown "either pancreatic or kidney"    SOCIAL HISTORY: Social History   Socioeconomic History   Marital status: Married    Spouse name: Otila Kluver   Number of children: 3   Years of education: Not on file   Highest education level: Not on file  Occupational History    Employer: OTHER  Tobacco Use   Smoking status: Every Day    Packs/day: 0.25    Years: 15.00    Additional  pack years: 0.00    Total pack years: 3.75    Types: Cigarettes   Smokeless tobacco: Never   Tobacco comments:    "Not quite" 0.5 ppd  Vaping Use   Vaping Use: Never used  Substance and Sexual Activity   Alcohol use: Yes    Alcohol/week: 1.0 - 2.0 standard drink of alcohol    Types: 1 - 2 Cans of beer per week   Drug use: Never   Sexual activity: Yes    Partners: Female  Other Topics Concern   Not on file  Social History Narrative   Caffeine: 2 cups/day   Social Determinants of Health  Financial Resource Strain: Not on file  Food Insecurity: Not on file  Transportation Needs: Not on file  Physical Activity: Not on file  Stress: Not on file  Social Connections: Not on file  Intimate Partner Violence: Not on file    PHYSICAL EXAM  GENERAL EXAM/CONSTITUTIONAL: Vitals:  Vitals:   04/19/22 1114  BP: (!) 145/84  Pulse: 86  Weight: 212 lb (96.2 kg)  Height: '5\' 7"'$  (1.702 m)   Body mass index is 33.2 kg/m. Wt Readings from Last 3 Encounters:  04/19/22 212 lb (96.2 kg)  04/17/22 212 lb (96.2 kg)  03/15/22 213 lb 6.4 oz (96.8 kg)   Patient is in no distress; well developed, nourished and groomed; neck is supple  EYES: Visual fields full to confrontation, Extraocular movements intacts,  No results found.  MUSCULOSKELETAL: Gait, strength, tone, movements noted in Neurologic exam below  NEUROLOGIC: MENTAL STATUS:      No data to display         awake, alert, oriented to person, place and time recent and remote memory intact normal attention and concentration language fluent, comprehension intact, naming intact fund of knowledge appropriate  CRANIAL NERVE:  2nd, 3rd, 4th, 6th - Visual fields full to confrontation, extraocular muscles intact, no nystagmus 5th - facial sensation symmetric 7th - facial strength symmetric 8th - hearing intact 9th - palate elevates symmetrically, uvula midline 11th - shoulder shrug symmetric 12th - tongue protrusion  midline  MOTOR:  normal bulk and tone, full strength in the BUE, BLE  SENSORY:  normal and symmetric to light touch  COORDINATION:  finger-nose-finger, fine finger movements normal  GAIT/STATION:  normal   DIAGNOSTIC DATA (LABS, IMAGING, TESTING) - I reviewed patient records, labs, notes, testing and imaging myself where available.  Lab Results  Component Value Date   WBC 6.6 04/17/2022   HGB 14.6 04/17/2022   HCT 43.6 04/17/2022   MCV 91.2 04/17/2022   PLT 217 04/17/2022      Component Value Date/Time   NA 138 04/17/2022 0818   K 4.3 04/17/2022 0818   CL 105 04/17/2022 0818   CO2 26 04/17/2022 0818   GLUCOSE 103 (H) 04/17/2022 0818   BUN 16 04/17/2022 0818   CREATININE 1.08 04/17/2022 0818   CREATININE 1.15 08/25/2019 1450   CALCIUM 8.8 (L) 04/17/2022 0818   PROT 6.7 04/17/2022 0818   ALBUMIN 4.3 04/17/2022 0818   AST 16 04/17/2022 0818   ALT 16 04/17/2022 0818   ALKPHOS 102 04/17/2022 0818   BILITOT 0.4 04/17/2022 0818   GFRNONAA >60 04/17/2022 0818   GFRNONAA 71 08/25/2019 1450   GFRAA >60 10/20/2019 1117   GFRAA 83 08/25/2019 1450   Lab Results  Component Value Date   CHOL 183 01/09/2022   HDL 29.10 (L) 01/09/2022   LDLCALC 146 (H) 02/04/2019   LDLDIRECT 140.0 01/09/2022   TRIG 204.0 (H) 01/09/2022   Lab Results  Component Value Date   HGBA1C 6.2 01/09/2022   No results found for: "VITAMINB12" Lab Results  Component Value Date   TSH 2.470 02/16/2022   MRI Brain with and without contrast 09/07/19 No evidence of intracranial metastases. Minimal chronic microvascular ischemic changes.   LTM EEG 06/30/19 This study is within normal limits. No seizures or epileptiform discharges were seen throughout the recording   I personally reviewed brain Images and previous EEG reports.   ASSESSMENT AND PLAN  57 y.o. year old male  with history of seizure disorder, sleep apnea, hypertension, and recent diagnosis  of kidney cancer who is presenting for  follow up for his nocturnal seizure.  He is doing well on Lamictal and clobazam, denies any side effect and no seizure since last visit.  At this time, I will check a clobazam level, and patient can continue to follow-up with NP [name] in 6 months or sooner if worse.  He voices understanding.   1. Nocturnal seizures ([place])   2. Encounter for therapeutic drug level monitoring      Patient Instructions  250 mg twice daily Will continue with clobazam 10 mg nightly Continue with your other medications Follow-up in 6 months with NP [name]   Per [facility] statutes, patients with seizures are not allowed to drive until they have been seizure-free for six months.  Other recommendations include using caution when using heavy equipment or power tools. Avoid working on ladders or at heights. Take showers instead of baths.  Do not swim alone.  Ensure the water temperature is not too high on the home water heater. Do not go swimming alone. Do not lock yourself in a room alone (i.e. bathroom). When caring for infants or small children, sit down when holding, feeding, or changing them to minimize risk of injury to the child in the event you have a seizure. Maintain good sleep hygiene. Avoid alcohol.  Also recommend adequate sleep, hydration, good diet and minimize stress.   During the Seizure  - First, ensure adequate ventilation and place patients on the floor on their left side  Loosen clothing around the neck and ensure the airway is patent. If the patient is clenching the teeth, do not force the mouth open with any object as this can cause severe damage - Remove all items from the surrounding that can be hazardous. The patient may be oblivious to what's happening and may not even know what he or she is doing. If the patient is confused and wandering, either gently guide him/her away and block access to outside areas - Reassure the individual and be comforting - Call 911. In most  cases, the seizure ends before EMS arrives. However, there are cases when seizures may last over 3 to 5 minutes. Or the individual may have developed breathing difficulties or severe injuries. If a pregnant patient or a person with diabetes develops a seizure, it is prudent to call an ambulance. - Finally, if the patient does not regain full consciousness, then call EMS. Most patients will remain confused for about 45 to 90 minutes after a seizure, so you must use judgment in calling for help. - Avoid restraints but make sure the patient is in a bed with padded side rails - Place the individual in a lateral position with the neck slightly flexed; this will help the saliva drain from the mouth and prevent the tongue from falling backward - Remove all nearby furniture and other hazards from the area - Provide verbal assurance as the individual is regaining consciousness - Provide the patient with privacy if possible - Call for help and start treatment as ordered by the caregiver   After the Seizure (Postictal Stage)  After a seizure, most patients experience confusion, fatigue, muscle pain and/or a headache. Thus, one should permit the individual to sleep. For the next few days, reassurance is essential. Being calm and helping reorient the person is also of importance.  Most seizures are painless and end spontaneously. Seizures are not harmful to others but can lead to complications such as stress on the  lungs, brain and the heart. Individuals with prior lung problems may develop labored breathing and respiratory distress.     Orders Placed This Encounter  Procedures   CLOBAZAM    No orders of the defined types were placed in this encounter.   Return in about 6 months (around 10/20/2022).    Alric Ran, MD 04/19/2022, 11:44 AM  [facility] [address], [place], [place] [address] [phone]

## 2022-04-23 ENCOUNTER — Encounter: Payer: Self-pay | Admitting: *Deleted

## 2022-04-23 LAB — CLOBAZAM
Clobazam: 176 ng/mL (ref 30–300)
Desmethylclobazam: 433 ng/mL (ref 300–3000)

## 2022-04-29 ENCOUNTER — Encounter: Payer: Self-pay | Admitting: Adult Health

## 2022-05-02 ENCOUNTER — Ambulatory Visit: Payer: BC Managed Care – PPO | Admitting: Adult Health

## 2022-05-08 ENCOUNTER — Other Ambulatory Visit: Payer: Self-pay

## 2022-05-29 ENCOUNTER — Telehealth: Payer: Self-pay

## 2022-05-29 NOTE — Telephone Encounter (Signed)
Patients wife Vincent David called and asked if we could help get Vincent David a special bed to help him sleep at night.she called [facility] and inquired if they would cover it and they requested we call them at [phone] opt 4 to order.  Called and they requested a CPT code be provided and the exact bed patient is requesting.  They will need the order, clinical information and last note faxed to them at [phone].   Called Vincent David back and left a message to let us know the exact bed or mattress they are requesting so we can order it.   Will send order once received.

## 2022-06-05 ENCOUNTER — Other Ambulatory Visit: Payer: Self-pay | Admitting: Hematology & Oncology

## 2022-06-05 ENCOUNTER — Encounter: Payer: Self-pay | Admitting: Hematology & Oncology

## 2022-06-05 MED ORDER — OXYCODONE HCL 10 MG PO TABS
10.0000 mg | ORAL_TABLET | ORAL | 0 refills | Status: DC | PRN
  Filled 2022-06-05: qty 90, 15d supply, fill #0

## 2022-06-05 MED ORDER — OXYCODONE HCL ER 15 MG PO T12A
15.0000 mg | EXTENDED_RELEASE_TABLET | Freq: Two times a day (BID) | ORAL | 0 refills | Status: DC
  Filled 2022-06-05: qty 2, 1d supply, fill #0
  Filled 2022-06-06: qty 32, 16d supply, fill #0

## 2022-06-06 ENCOUNTER — Other Ambulatory Visit (HOSPITAL_BASED_OUTPATIENT_CLINIC_OR_DEPARTMENT_OTHER): Payer: Self-pay

## 2022-06-06 ENCOUNTER — Other Ambulatory Visit: Payer: Self-pay

## 2022-06-06 DIAGNOSIS — G4733 Obstructive sleep apnea (adult) (pediatric): Secondary | ICD-10-CM | POA: Diagnosis not present

## 2022-06-08 ENCOUNTER — Inpatient Hospital Stay: Payer: BC Managed Care – PPO

## 2022-06-08 ENCOUNTER — Encounter: Payer: Self-pay | Admitting: Hematology & Oncology

## 2022-06-08 ENCOUNTER — Inpatient Hospital Stay (HOSPITAL_BASED_OUTPATIENT_CLINIC_OR_DEPARTMENT_OTHER): Payer: BC Managed Care – PPO | Admitting: Hematology & Oncology

## 2022-06-08 ENCOUNTER — Encounter: Payer: Self-pay | Admitting: Neurology

## 2022-06-08 ENCOUNTER — Inpatient Hospital Stay: Payer: BC Managed Care – PPO | Attending: Hematology & Oncology

## 2022-06-08 ENCOUNTER — Other Ambulatory Visit: Payer: Self-pay

## 2022-06-08 VITALS — BP 149/70 | HR 75 | Resp 17

## 2022-06-08 DIAGNOSIS — Z5112 Encounter for antineoplastic immunotherapy: Secondary | ICD-10-CM | POA: Diagnosis not present

## 2022-06-08 DIAGNOSIS — C642 Malignant neoplasm of left kidney, except renal pelvis: Secondary | ICD-10-CM

## 2022-06-08 DIAGNOSIS — C7951 Secondary malignant neoplasm of bone: Secondary | ICD-10-CM | POA: Insufficient documentation

## 2022-06-08 DIAGNOSIS — Z7982 Long term (current) use of aspirin: Secondary | ICD-10-CM | POA: Insufficient documentation

## 2022-06-08 DIAGNOSIS — M25559 Pain in unspecified hip: Secondary | ICD-10-CM | POA: Diagnosis not present

## 2022-06-08 DIAGNOSIS — Z79899 Other long term (current) drug therapy: Secondary | ICD-10-CM | POA: Insufficient documentation

## 2022-06-08 LAB — LACTATE DEHYDROGENASE: LDH: 172 U/L (ref 98–192)

## 2022-06-08 LAB — CBC WITH DIFFERENTIAL (CANCER CENTER ONLY)
Abs Immature Granulocytes: 0.03 10*3/uL (ref 0.00–0.07)
Basophils Absolute: 0 10*3/uL (ref 0.0–0.1)
Basophils Relative: 1 %
Eosinophils Absolute: 0.4 10*3/uL (ref 0.0–0.5)
Eosinophils Relative: 6 %
HCT: 45.2 % (ref 39.0–52.0)
Hemoglobin: 15 g/dL (ref 13.0–17.0)
Immature Granulocytes: 1 %
Lymphocytes Relative: 20 %
Lymphs Abs: 1.3 10*3/uL (ref 0.7–4.0)
MCH: 30.6 pg (ref 26.0–34.0)
MCHC: 33.2 g/dL (ref 30.0–36.0)
MCV: 92.2 fL (ref 80.0–100.0)
Monocytes Absolute: 0.4 10*3/uL (ref 0.1–1.0)
Monocytes Relative: 7 %
Neutro Abs: 4.1 10*3/uL (ref 1.7–7.7)
Neutrophils Relative %: 65 %
Platelet Count: 199 10*3/uL (ref 150–400)
RBC: 4.9 MIL/uL (ref 4.22–5.81)
RDW: 14.1 % (ref 11.5–15.5)
WBC Count: 6.2 10*3/uL (ref 4.0–10.5)
nRBC: 0 % (ref 0.0–0.2)

## 2022-06-08 LAB — CMP (CANCER CENTER ONLY)
ALT: 14 U/L (ref 0–44)
AST: 15 U/L (ref 15–41)
Albumin: 4.1 g/dL (ref 3.5–5.0)
Alkaline Phosphatase: 106 U/L (ref 38–126)
Anion gap: 6 (ref 5–15)
BUN: 18 mg/dL (ref 6–20)
CO2: 25 mmol/L (ref 22–32)
Calcium: 8.3 mg/dL — ABNORMAL LOW (ref 8.9–10.3)
Chloride: 108 mmol/L (ref 98–111)
Creatinine: 1.15 mg/dL (ref 0.61–1.24)
GFR, Estimated: >60 mL/min (ref >60)
Glucose, Bld: 119 mg/dL — ABNORMAL HIGH (ref 70–99)
Potassium: 4.3 mmol/L (ref 3.5–5.1)
Sodium: 139 mmol/L (ref 135–145)
Total Bilirubin: 0.4 mg/dL (ref 0.3–1.2)
Total Protein: 6.8 g/dL (ref 6.5–8.1)

## 2022-06-08 LAB — TSH: TSH: 2.135 u[IU]/mL (ref 0.350–4.500)

## 2022-06-08 MED ORDER — SODIUM CHLORIDE 0.9% FLUSH
10.0000 mL | INTRAVENOUS | Status: DC | PRN
  Administered 2022-06-08: 10 mL

## 2022-06-08 MED ORDER — SODIUM CHLORIDE 0.9 % IV SOLN
480.0000 mg | Freq: Once | INTRAVENOUS | Status: AC
  Administered 2022-06-08: 480 mg via INTRAVENOUS
  Filled 2022-06-08: qty 48

## 2022-06-08 MED ORDER — HEPARIN SOD (PORK) LOCK FLUSH 100 UNIT/ML IV SOLN
500.0000 [IU] | Freq: Once | INTRAVENOUS | Status: AC | PRN
  Administered 2022-06-08: 500 [IU]

## 2022-06-08 MED ORDER — SODIUM CHLORIDE 0.9 % IV SOLN: Freq: Once | INTRAVENOUS | Status: AC

## 2022-06-08 NOTE — Patient Instructions (Signed)

## 2022-06-08 NOTE — Progress Notes (Signed)
Hematology and Oncology Follow Up Visit  Vincent David [id number] February 04, 1966 57 y.o. 06/08/2022   Principle Diagnosis:  Metastatic Renal Cell Carcinoma - bone mets  Current Therapy:   Nivoumab/Ipilimumab -- s/p cycle #5 -- started on 09/29/2019 -- maintenance Nivolumab q 2 month -- Started on 12/29/2019 Xgeva 120 mg sq q 3 months -- next dose in 08/2022 XRT to right iliac mass/ L5 vertebral body/ Left femoral neck LEFT hip repair Cryoablation of left kidney mass-10/19/2020     Interim History:  Mr. Palley is back for follow-up.  He is doing pretty well.  He is having problems with the left hip.  He is having still having pain in that hip.  I know that he has had surgery in that area.  He was not come back to see Orthopedic Surgery.  We will try to help him out any way that we can.  He says that if we can get a new mattress that would help.  He apparently qualifies for a new mattress by insurance because he was involved with the 911 recovery.  He and his wife will be going up to [place] for a wedding.  They be gone for a couple of weeks.  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  He enjoys this.  He has had no problems with cough.  There is no shortness of breath.  He has no change in bowel or bladder habits.  He has had no bleeding.  There is been no rashes.  He has had no headache.  His TSH today was 2.1.  Currently, I was his performance status is probably ECOG 1.    Medications:  Current Outpatient Medications:    amLODipine (NORVASC) 5 MG tablet, TAKE 1 TABLET EVERY DAY, Disp: 90 tablet, Rfl: 4   aspirin EC 81 MG tablet, Take 1 tablet (81 mg total) by mouth 2 (two) times daily., Disp: 60 tablet, Rfl: 0   cloBAZam (ONFI) 10 MG tablet, Take 1 tablet (10 mg total) by mouth at bedtime., Disp: 30 tablet, Rfl: 5   ibuprofen (ADVIL) 200 MG tablet, Take 400 mg by mouth every 8 (eight) hours as needed (pain.)., Disp: , Rfl:    lamoTRIgine (LAMICTAL) 100 MG tablet,  TAKE 2.5 TABLETS (250 MG TOTAL) BY MOUTH 2 (TWO) TIMES DAILY., Disp: 450 tablet, Rfl: 0   oxyCODONE (OXYCONTIN) 15 mg 12 hr tablet, Take 1 tablet (15 mg total) by mouth every 12 (twelve) hours., Disp: 34 tablet, Rfl: 0   Oxycodone HCl 10 MG TABS, Take 1 tablet (10 mg total) by mouth every 4 (four) hours as needed., Disp: 90 tablet, Rfl: 0   SUMAtriptan (IMITREX) 50 MG tablet, Take 50 mg by mouth every 2 (two) hours as needed for migraine. May repeat in 2 hours if headache persists or recurs., Disp: , Rfl:  No current facility-administered medications for this visit.  Facility-Administered Medications Ordered in Other Visits:    sodium chloride flush (NS) 0.9 % injection 10 mL, 10 mL, Intracatheter, PRN, Josph Macho, MD, 10 mL at 05/19/20 1343  Allergies: No Known Allergies  Past Medical History, Surgical history, Social history, and Family History were reviewed and updated.  Review of Systems: Review of Systems  Constitutional: Negative.   HENT:  Negative.    Eyes: Negative.   Respiratory: Negative.    Cardiovascular: Negative.   Gastrointestinal: Negative.   Endocrine: Negative.   Musculoskeletal:  Positive for back pain and flank pain.  Skin: Negative.   Neurological: Negative.   Hematological: Negative.   Psychiatric/Behavioral: Negative.      Physical Exam:  height is 5\' 7"  (1.702 m) and weight is 214 lb (97.1 kg). His oral temperature is 98.6 F (37 C). His blood pressure is 122/92 (abnormal) and his pulse is 77. His respiration is 18 and oxygen saturation is 97%.   Wt Readings from Last 3 Encounters:  06/08/22 214 lb (97.1 kg)  04/19/22 212 lb (96.2 kg)  04/17/22 212 lb (96.2 kg)    Physical Exam Vitals reviewed.  HENT:     Head: Normocephalic and atraumatic.  Eyes:     Pupils: Pupils are equal, round, and reactive to light.  Cardiovascular:     Rate and Rhythm: Normal rate and regular rhythm.     Heart sounds: Normal heart sounds.  Pulmonary:     Effort:  Pulmonary effort is normal.     Breath sounds: Normal breath sounds.  Abdominal:     General: Bowel sounds are normal.     Palpations: Abdomen is soft.  Musculoskeletal:        General: No tenderness or deformity. Normal range of motion.     Cervical back: Normal range of motion.  Lymphadenopathy:     Cervical: No cervical adenopathy.  Skin:    General: Skin is warm and dry.     Findings: No erythema or rash.  Neurological:     Mental Status: He is alert and oriented to person, place, and time.  Psychiatric:        Behavior: Behavior normal.        Thought Content: Thought content normal.        Judgment: Judgment normal.     Lab Results  Component Value Date   WBC 6.2 06/08/2022   HGB 15.0 06/08/2022   HCT 45.2 06/08/2022   MCV 92.2 06/08/2022   PLT 199 06/08/2022     Chemistry      Component Value Date/Time   NA 139 06/08/2022 0920   K 4.3 06/08/2022 0920   CL 108 06/08/2022 0920   CO2 25 06/08/2022 0920   BUN 18 06/08/2022 0920   CREATININE 1.15 06/08/2022 0920   CREATININE 1.15 08/25/2019 1450      Component Value Date/Time   CALCIUM 8.3 (L) 06/08/2022 0920   ALKPHOS 106 06/08/2022 0920   AST 15 06/08/2022 0920   ALT 14 06/08/2022 0920   BILITOT 0.4 06/08/2022 0920      Impression and Plan: Mr. Kopacz is a very nice 57 year old white male.  He has metastatic renal cell carcinoma.  For some reason, his disease clearly favors his bones.  His lungs and liver and lymph nodes all appear to be clean.  He is doing great on immunotherapy.  I would have to think that the immunotherapy is still effective.  We will set him up with a another PET scan.  We will do this probably right after Memorial Day.  I did state that he is having the problems with the left hip.  Again he had surgery over there.  We will plan to get him back in I think 6 weeks.  At this point, it is clear that the immunotherapy is working quite nicely.  We will set him up with another PET scan in  May.  We will see what the x-ray shows of his left hip.  He will get his Rivka Barbara today.  Will plan to see him back in May after  his PET scan is done.     5/3/202410:20 AM

## 2022-06-08 NOTE — Patient Instructions (Signed)
[facility]  Discharge Instructions: Thank you for choosing [facility] to provide your oncology and hematology care.   If you have a lab appointment with the Cancer Center, please go directly to the Cancer Center and check in at the registration area.  Wear comfortable clothing and clothing appropriate for easy access to any Portacath or PICC line.   We strive to give you quality time with your provider. You may need to reschedule your appointment if you arrive late (15 or more minutes).  Arriving late affects you and other patients whose appointments are after yours.  Also, if you miss three or more appointments without notifying the office, you may be dismissed from the clinic at the provider's discretion.      For prescription refill requests, have your pharmacy contact our office and allow 72 hours for refills to be completed.    Today you received the following chemotherapy and/or immunotherapy agents Opdivo.      To help prevent nausea and vomiting after your treatment, we encourage you to take your nausea medication as directed.  BELOW ARE SYMPTOMS THAT SHOULD BE REPORTED IMMEDIATELY: *FEVER GREATER THAN 100.4 F (38 C) OR HIGHER *CHILLS OR SWEATING *NAUSEA AND VOMITING THAT IS NOT CONTROLLED WITH YOUR NAUSEA MEDICATION *UNUSUAL SHORTNESS OF BREATH *UNUSUAL BRUISING OR BLEEDING *URINARY PROBLEMS (pain or burning when urinating, or frequent urination) *BOWEL PROBLEMS (unusual diarrhea, constipation, pain near the anus) TENDERNESS IN MOUTH AND THROAT WITH OR WITHOUT PRESENCE OF ULCERS (sore throat, sores in mouth, or a toothache) UNUSUAL RASH, SWELLING OR PAIN  UNUSUAL VAGINAL DISCHARGE OR ITCHING   Items with * indicate a potential emergency and should be followed up as soon as possible or go to the Emergency Department if any problems should occur.  Please show the CHEMOTHERAPY ALERT CARD or IMMUNOTHERAPY ALERT CARD at check-in  to the Emergency Department and triage nurse. Should you have questions after your visit or need to cancel or reschedule your appointment, please contact [facility]  [phone] and follow the prompts.  Office hours are 8:00 a.m. to 4:30 p.m. Monday - Friday. Please note that voicemails left after 4:00 p.m. may not be returned until the following business day.  We are closed weekends and major holidays. You have access to a nurse at all times for urgent questions. Please call the main number to the clinic [phone] and follow the prompts.  For any non-urgent questions, you may also contact your provider using MyChart. We now offer e-Visits for anyone 18 and older to request care online for non-urgent symptoms. For details visit [email or web address].   Also download the MyChart app! Go to the app store, search "MyChart", open the app, select , and log in with your MyChart username and password.   

## 2022-06-09 LAB — T4: T4, Total: 5.5 ug/dL (ref 4.5–12.0)

## 2022-06-11 ENCOUNTER — Other Ambulatory Visit: Payer: Self-pay | Admitting: Neurology

## 2022-06-11 MED ORDER — CLOBAZAM 10 MG PO TABS: 10.0000 mg | ORAL_TABLET | Freq: Every evening | ORAL | 5 refills | Status: DC

## 2022-06-11 NOTE — Telephone Encounter (Signed)
Please clarify which pharmacy patient wants medication sent (here in [place] or in [place])

## 2022-06-29 ENCOUNTER — Other Ambulatory Visit: Payer: Self-pay | Admitting: Hematology & Oncology

## 2022-07-10 ENCOUNTER — Encounter: Payer: Self-pay | Admitting: Neurology

## 2022-07-11 ENCOUNTER — Other Ambulatory Visit: Payer: Self-pay | Admitting: Neurology

## 2022-07-11 NOTE — Progress Notes (Signed)
This encounter was created in error - please disregard.

## 2022-07-11 NOTE — Telephone Encounter (Signed)
Requested Prescriptions   Pending Prescriptions Disp Refills   cloBAZam (ONFI) 10 MG tablet [Pharmacy Med Name: CLOBAZAM 10 MG TABLET] 30 tablet 2    Sig: TAKE 1 TABLET BY MOUTH EVERYDAY AT BEDTIME   Last seen 04/19/22, next appt scheduled for 12/04/22 Dispenses   Dispensed Days Supply Quantity Provider Pharmacy  CLOBAZAM 10 MG TABLET 06/14/2022 30 30 each Windell Norfolk, MD [facility] [phone] ...  CLOBAZAM 10 MG TABLET 05/13/2022 30 30 each [facility]ell Norfolk, [facility] [phone] - W...  CLOBAZAM 10 MG TABLET 04/13/2022 30 30 each Windell Norfolk, [facility] [phone] - W...  CLOBAZAM 10 MG TABLET 03/15/2022 30 30 each Windell Norfolk, [facility] [phone] - W...  CLOBAZAM 10 MG TABLET 02/09/2022 30 30 each Windell Norfolk, [facility] [phone] - W...  CLOBAZAM 10 MG TABLET 01/02/2022 30 30 each Windell Norfolk, [facility] [phone] - [address] Kitchen[address] [address]

## 2022-07-30 ENCOUNTER — Encounter: Payer: Self-pay | Admitting: Hematology & Oncology

## 2022-08-15 ENCOUNTER — Encounter: Payer: Self-pay | Admitting: Hematology & Oncology

## 2022-08-16 ENCOUNTER — Encounter (HOSPITAL_COMMUNITY): Payer: PRIVATE HEALTH INSURANCE

## 2022-08-19 ENCOUNTER — Other Ambulatory Visit: Payer: Self-pay | Admitting: Hematology & Oncology

## 2022-08-20 ENCOUNTER — Other Ambulatory Visit: Payer: Self-pay

## 2022-08-20 ENCOUNTER — Other Ambulatory Visit (HOSPITAL_BASED_OUTPATIENT_CLINIC_OR_DEPARTMENT_OTHER): Payer: Self-pay

## 2022-08-20 MED ORDER — OXYCODONE HCL 10 MG PO TABS
10.0000 mg | ORAL_TABLET | ORAL | 0 refills | Status: DC | PRN
  Filled 2022-08-20: qty 90, 15d supply, fill #0

## 2022-08-20 MED ORDER — OXYCODONE HCL ER 15 MG PO T12A
15.0000 mg | EXTENDED_RELEASE_TABLET | Freq: Two times a day (BID) | ORAL | 0 refills | Status: DC
  Filled 2022-08-20: qty 34, 17d supply, fill #0

## 2022-08-21 ENCOUNTER — Ambulatory Visit
Admission: RE | Admit: 2022-08-21 | Discharge: 2022-08-21 | Disposition: A | Discharge status: Home / Self Care | Payer: 59 | Source: Ambulatory Visit | Attending: Hematology & Oncology | Admitting: Hematology & Oncology

## 2022-08-21 DIAGNOSIS — I7143 Infrarenal abdominal aortic aneurysm, without rupture: Secondary | ICD-10-CM | POA: Insufficient documentation

## 2022-08-21 DIAGNOSIS — I700 Atherosclerosis of aorta: Secondary | ICD-10-CM | POA: Diagnosis not present

## 2022-08-21 DIAGNOSIS — C642 Malignant neoplasm of left kidney, except renal pelvis: Secondary | ICD-10-CM | POA: Insufficient documentation

## 2022-08-21 LAB — GLUCOSE, CAPILLARY: Glucose-Capillary: 107 mg/dL — ABNORMAL HIGH (ref 70–99)

## 2022-08-21 MED ORDER — FLUDEOXYGLUCOSE F - 18 (FDG) INJECTION
11.1000 | Freq: Once | INTRAVENOUS | Status: AC | PRN
  Administered 2022-08-21: 12.22 via INTRAVENOUS

## 2022-08-22 ENCOUNTER — Other Ambulatory Visit: Payer: Self-pay

## 2022-08-22 DIAGNOSIS — C642 Malignant neoplasm of left kidney, except renal pelvis: Secondary | ICD-10-CM

## 2022-08-23 ENCOUNTER — Inpatient Hospital Stay (HOSPITAL_BASED_OUTPATIENT_CLINIC_OR_DEPARTMENT_OTHER): Payer: BC Managed Care – PPO | Admitting: Hematology & Oncology

## 2022-08-23 ENCOUNTER — Encounter: Payer: Self-pay | Admitting: Hematology & Oncology

## 2022-08-23 ENCOUNTER — Inpatient Hospital Stay: Payer: BC Managed Care – PPO | Attending: Hematology & Oncology

## 2022-08-23 ENCOUNTER — Inpatient Hospital Stay: Payer: BC Managed Care – PPO

## 2022-08-23 VITALS — BP 142/70 | HR 83 | Temp 98.0°F

## 2022-08-23 VITALS — Resp 20 | Ht 67.0 in | Wt 210.8 lb

## 2022-08-23 DIAGNOSIS — C642 Malignant neoplasm of left kidney, except renal pelvis: Secondary | ICD-10-CM

## 2022-08-23 DIAGNOSIS — C649 Malignant neoplasm of unspecified kidney, except renal pelvis: Secondary | ICD-10-CM | POA: Diagnosis not present

## 2022-08-23 DIAGNOSIS — Z5112 Encounter for antineoplastic immunotherapy: Secondary | ICD-10-CM | POA: Diagnosis not present

## 2022-08-23 DIAGNOSIS — Z79620 Long term (current) use of immunosuppressive biologic: Secondary | ICD-10-CM | POA: Insufficient documentation

## 2022-08-23 DIAGNOSIS — M5432 Sciatica, left side: Secondary | ICD-10-CM | POA: Diagnosis not present

## 2022-08-23 DIAGNOSIS — C7951 Secondary malignant neoplasm of bone: Secondary | ICD-10-CM | POA: Insufficient documentation

## 2022-08-23 LAB — CBC WITH DIFFERENTIAL (CANCER CENTER ONLY)
Abs Immature Granulocytes: 0.08 10*3/uL — ABNORMAL HIGH (ref 0.00–0.07)
Basophils Absolute: 0.1 10*3/uL (ref 0.0–0.1)
Basophils Relative: 1 %
Eosinophils Absolute: 0.4 10*3/uL (ref 0.0–0.5)
Eosinophils Relative: 4 %
HCT: 46.6 % (ref 39.0–52.0)
Hemoglobin: 15.9 g/dL (ref 13.0–17.0)
Immature Granulocytes: 1 %
Lymphocytes Relative: 17 %
Lymphs Abs: 1.6 10*3/uL (ref 0.7–4.0)
MCH: 31.3 pg (ref 26.0–34.0)
MCHC: 34.1 g/dL (ref 30.0–36.0)
MCV: 91.7 fL (ref 80.0–100.0)
Monocytes Absolute: 0.6 10*3/uL (ref 0.1–1.0)
Monocytes Relative: 6 %
Neutro Abs: 6.8 10*3/uL (ref 1.7–7.7)
Neutrophils Relative %: 71 %
Platelet Count: 214 10*3/uL (ref 150–400)
RBC: 5.08 MIL/uL (ref 4.22–5.81)
RDW: 13.4 % (ref 11.5–15.5)
WBC Count: 9.4 10*3/uL (ref 4.0–10.5)
nRBC: 0 % (ref 0.0–0.2)

## 2022-08-23 LAB — CMP (CANCER CENTER ONLY)
ALT: 18 U/L (ref 0–44)
AST: 18 U/L (ref 15–41)
Albumin: 4.8 g/dL (ref 3.5–5.0)
Alkaline Phosphatase: 113 U/L (ref 38–126)
Anion gap: 9 (ref 5–15)
BUN: 22 mg/dL — ABNORMAL HIGH (ref 6–20)
CO2: 25 mmol/L (ref 22–32)
Calcium: 9.2 mg/dL (ref 8.9–10.3)
Chloride: 102 mmol/L (ref 98–111)
Creatinine: 1.47 mg/dL — ABNORMAL HIGH (ref 0.61–1.24)
GFR, Estimated: 55 mL/min — ABNORMAL LOW (ref >60)
Glucose, Bld: 98 mg/dL (ref 70–99)
Potassium: 4.2 mmol/L (ref 3.5–5.1)
Sodium: 136 mmol/L (ref 135–145)
Total Bilirubin: 0.5 mg/dL (ref 0.3–1.2)
Total Protein: 7.4 g/dL (ref 6.5–8.1)

## 2022-08-23 LAB — TSH: TSH: 2.247 u[IU]/mL (ref 0.350–4.500)

## 2022-08-23 LAB — LACTATE DEHYDROGENASE: LDH: 191 U/L (ref 98–192)

## 2022-08-23 MED ORDER — HEPARIN SOD (PORK) LOCK FLUSH 100 UNIT/ML IV SOLN
500.0000 [IU] | Freq: Once | INTRAVENOUS | Status: AC
  Administered 2022-08-23: 500 [IU] via INTRAVENOUS

## 2022-08-23 MED ORDER — SODIUM CHLORIDE 0.9% FLUSH
10.0000 mL | INTRAVENOUS | Status: AC | PRN
  Administered 2022-08-23: 10 mL via INTRAVENOUS

## 2022-08-23 MED ORDER — SODIUM CHLORIDE 0.9% FLUSH
10.0000 mL | Freq: Once | INTRAVENOUS | Status: AC
  Administered 2022-08-23: 10 mL via INTRAVENOUS

## 2022-08-23 NOTE — Progress Notes (Signed)
Hematology and Oncology Follow Up Visit  Vincent David [id number] September 29, 1965 57 y.o. 08/23/2022   Principle Diagnosis:  Metastatic Renal Cell Carcinoma - bone mets  Current Therapy:   Nivoumab/Ipilimumab -- s/p cycle #5 -- started on 09/29/2019 -- maintenance Nivolumab q 2 month -- Started on 12/29/2019 Xgeva 120 mg sq q 2 months -- next dose in 10/2022 XRT to right iliac mass/ L5 vertebral body/ Left femoral neck LEFT hip repair Cryoablation of left kidney mass-10/19/2020     Interim History:  Vincent David is back for follow-up.  He is having quite a bit of problems with some sciatica down the left leg.  He has had this down the right leg before.  He said that Interventional Radiology has helped him with injections.  We will have to set him up with another appoint with IR.  He did have a PET scan that was done.  Thankfully, the PET scan did not show any evidence of obvious active disease.  He had the stable changes with his bones.  Again nothing that looked hypermetabolic on PET scan.  He has had no issues with cough or shortness of breath.  He is still working part-time at a Water quality scientist course.  He truly enjoys this.  He has had no fever.  He has noted no problems with COVID.  There is no change in bowel or bladder habits.  His last TSH back in May was 2.1.  Overall, I would say that his performance status for now is ECOG 1.     Medications:  Current Outpatient Medications:    amLODipine (NORVASC) 5 MG tablet, TAKE 1 TABLET BY MOUTH EVERY DAY, Disp: 90 tablet, Rfl: 4   aspirin EC 81 MG tablet, Take 1 tablet (81 mg total) by mouth 2 (two) times daily., Disp: 60 tablet, Rfl: 0   cloBAZam (ONFI) 10 MG tablet, TAKE 1 TABLET BY MOUTH EVERYDAY AT BEDTIME, Disp: 30 tablet, Rfl: 2   ibuprofen (ADVIL) 200 MG tablet, Take 400 mg by mouth every 8 (eight) hours as needed (pain.)., Disp: , Rfl:    lamoTRIgine (LAMICTAL) 100 MG tablet, TAKE 2.5 TABLETS (250 MG TOTAL) BY MOUTH 2 (TWO) TIMES  DAILY. (Patient taking differently: Take 200 mg by mouth 2 (two) times daily.), Disp: 450 tablet, Rfl: 0   lamoTRIgine (LAMICTAL) 25 MG tablet, Take 25 mg by mouth 2 (two) times daily., Disp: , Rfl:    oxyCODONE (OXYCONTIN) 15 mg 12 hr tablet, Take 1 tablet (15 mg total) by mouth every 12 (twelve) hours., Disp: 34 tablet, Rfl: 0   Oxycodone HCl 10 MG TABS, Take 1 tablet (10 mg total) by mouth every 4 (four) hours as needed., Disp: 90 tablet, Rfl: 0   SUMAtriptan (IMITREX) 50 MG tablet, Take 50 mg by mouth every 2 (two) hours as needed for migraine. May repeat in 2 hours if headache persists or recurs. (Patient not taking: Reported on 08/23/2022), Disp: , Rfl:  No current facility-administered medications for this visit.  Facility-Administered Medications Ordered in Other Visits:    sodium chloride flush (NS) 0.9 % injection 10 mL, 10 mL, Intracatheter, PRN, Josph Macho, MD, 10 mL at 05/19/20 1343  Allergies: No Known Allergies  Past Medical History, Surgical history, Social history, and Family History were reviewed and updated.  Review of Systems: Review of Systems  Constitutional: Negative.   HENT:  Negative.    Eyes: Negative.   Respiratory: Negative.    Cardiovascular: Negative.   Gastrointestinal: Negative.  Endocrine: Negative.   Musculoskeletal:  Positive for back pain and flank pain.  Skin: Negative.   Neurological: Negative.   Hematological: Negative.   Psychiatric/Behavioral: Negative.      Physical Exam:  height is 5\' 7"  (1.702 m) and weight is 210 lb 12.8 oz (95.6 kg). His respiration is 20.   Wt Readings from Last 3 Encounters:  08/23/22 210 lb 12.8 oz (95.6 kg)  06/08/22 214 lb (97.1 kg)  04/19/22 212 lb (96.2 kg)    Physical Exam Vitals reviewed.  HENT:     Head: Normocephalic and atraumatic.  Eyes:     Pupils: Pupils are equal, round, and reactive to light.  Cardiovascular:     Rate and Rhythm: Normal rate and regular rhythm.     Heart sounds: Normal  heart sounds.  Pulmonary:     Effort: Pulmonary effort is normal.     Breath sounds: Normal breath sounds.  Abdominal:     General: Bowel sounds are normal.     Palpations: Abdomen is soft.  Musculoskeletal:        General: No tenderness or deformity. Normal range of motion.     Cervical back: Normal range of motion.  Lymphadenopathy:     Cervical: No cervical adenopathy.  Skin:    General: Skin is warm and dry.     Findings: No erythema or rash.  Neurological:     Mental Status: He is alert and oriented to person, place, and time.  Psychiatric:        Behavior: Behavior normal.        Thought Content: Thought content normal.        Judgment: Judgment normal.     Lab Results  Component Value Date   WBC 9.4 08/23/2022   HGB 15.9 08/23/2022   HCT 46.6 08/23/2022   MCV 91.7 08/23/2022   PLT 214 08/23/2022     Chemistry      Component Value Date/Time   NA 136 08/23/2022 1455   K 4.2 08/23/2022 1455   CL 102 08/23/2022 1455   CO2 25 08/23/2022 1455   BUN 22 (H) 08/23/2022 1455   CREATININE 1.47 (H) 08/23/2022 1455   CREATININE 1.15 08/25/2019 1450      Component Value Date/Time   CALCIUM 9.2 08/23/2022 1455   ALKPHOS 113 08/23/2022 1455   AST 18 08/23/2022 1455   ALT 18 08/23/2022 1455   BILITOT 0.5 08/23/2022 1455      Impression and Plan: Vincent David is a very nice 57 year old white male.  He has metastatic renal cell carcinoma.  For some reason, his disease clearly favors his bones.  His lungs and liver and lymph nodes all appear to be clean.  He is doing great on immunotherapy.  I would have to think that the immunotherapy is still effective.  Again, the PET scan certainly looks quite encouraging.  I will still plan to get him back in a couple months.  I do not think we have to do another PET scan probably until next year.  He will get his Rivka Barbara when we treat him this month.  I will have to see about IR seen him up for the possibility of an epidural.       7/18/20243:44 PM

## 2022-08-23 NOTE — Addendum Note (Signed)
Addended by: Arlan Organ R on: 08/23/2022 04:45 PM   Modules accepted: Orders

## 2022-08-23 NOTE — Patient Instructions (Signed)

## 2022-08-23 NOTE — Addendum Note (Signed)
Addended by: Reesa Chew on: 08/23/2022 04:10 PM   Modules accepted: Orders

## 2022-08-23 NOTE — Addendum Note (Signed)
Addended by: Cooper Render on: 08/23/2022 03:09 PM   Modules accepted: Orders

## 2022-08-24 LAB — T4: T4, Total: 6 ug/dL (ref 4.5–12.0)

## 2022-08-28 ENCOUNTER — Inpatient Hospital Stay: Payer: BC Managed Care – PPO

## 2022-08-28 VITALS — BP 136/66 | HR 75 | Temp 98.2°F | Resp 18

## 2022-08-28 DIAGNOSIS — C642 Malignant neoplasm of left kidney, except renal pelvis: Secondary | ICD-10-CM

## 2022-08-28 DIAGNOSIS — Z9189 Other specified personal risk factors, not elsewhere classified: Secondary | ICD-10-CM

## 2022-08-28 DIAGNOSIS — Z79620 Long term (current) use of immunosuppressive biologic: Secondary | ICD-10-CM | POA: Diagnosis not present

## 2022-08-28 DIAGNOSIS — C649 Malignant neoplasm of unspecified kidney, except renal pelvis: Secondary | ICD-10-CM | POA: Diagnosis not present

## 2022-08-28 DIAGNOSIS — C7951 Secondary malignant neoplasm of bone: Secondary | ICD-10-CM | POA: Diagnosis not present

## 2022-08-28 DIAGNOSIS — Z5112 Encounter for antineoplastic immunotherapy: Secondary | ICD-10-CM | POA: Diagnosis not present

## 2022-08-28 DIAGNOSIS — M5432 Sciatica, left side: Secondary | ICD-10-CM | POA: Diagnosis not present

## 2022-08-28 MED ORDER — SODIUM CHLORIDE 0.9 % IV SOLN: Freq: Once | INTRAVENOUS | Status: AC

## 2022-08-28 MED ORDER — HEPARIN SOD (PORK) LOCK FLUSH 100 UNIT/ML IV SOLN
500.0000 [IU] | Freq: Once | INTRAVENOUS | Status: AC | PRN
  Administered 2022-08-28: 500 [IU]

## 2022-08-28 MED ORDER — SODIUM CHLORIDE 0.9 % IV SOLN
480.0000 mg | Freq: Once | INTRAVENOUS | Status: AC
  Administered 2022-08-28: 480 mg via INTRAVENOUS
  Filled 2022-08-28: qty 48

## 2022-08-28 MED ORDER — DENOSUMAB 120 MG/1.7ML ~~LOC~~ SOLN
120.0000 mg | Freq: Once | SUBCUTANEOUS | Status: AC
  Administered 2022-08-28: 120 mg via SUBCUTANEOUS
  Filled 2022-08-28: qty 1.7

## 2022-08-28 MED ORDER — SODIUM CHLORIDE 0.9% FLUSH
10.0000 mL | INTRAVENOUS | Status: DC | PRN
  Administered 2022-08-28: 10 mL

## 2022-08-28 NOTE — Patient Instructions (Signed)
[facility]  Discharge Instructions: Thank you for choosing [facility] to provide your oncology and hematology care.   If you have a lab appointment with the Cancer Center, please go directly to the Cancer Center and check in at the registration area.  Wear comfortable clothing and clothing appropriate for easy access to any Portacath or PICC line.   We strive to give you quality time with your provider. You may need to reschedule your appointment if you arrive late (15 or more minutes).  Arriving late affects you and other patients whose appointments are after yours.  Also, if you miss three or more appointments without notifying the office, you may be dismissed from the clinic at the provider's discretion.      For prescription refill requests, have your pharmacy contact our office and allow 72 hours for refills to be completed.    Today you received the following chemotherapy and/or immunotherapy agents Opdivo, Xgeva.      To help prevent nausea and vomiting after your treatment, we encourage you to take your nausea medication as directed.  BELOW ARE SYMPTOMS THAT SHOULD BE REPORTED IMMEDIATELY: *FEVER GREATER THAN 100.4 F (38 C) OR HIGHER *CHILLS OR SWEATING *NAUSEA AND VOMITING THAT IS NOT CONTROLLED WITH YOUR NAUSEA MEDICATION *UNUSUAL SHORTNESS OF BREATH *UNUSUAL BRUISING OR BLEEDING *URINARY PROBLEMS (pain or burning when urinating, or frequent urination) *BOWEL PROBLEMS (unusual diarrhea, constipation, pain near the anus) TENDERNESS IN MOUTH AND THROAT WITH OR WITHOUT PRESENCE OF ULCERS (sore throat, sores in mouth, or a toothache) UNUSUAL RASH, SWELLING OR PAIN  UNUSUAL VAGINAL DISCHARGE OR ITCHING   Items with * indicate a potential emergency and should be followed up as soon as possible or go to the Emergency Department if any problems should occur.  Please show the CHEMOTHERAPY ALERT CARD or IMMUNOTHERAPY ALERT CARD at  check-in to the Emergency Department and triage nurse. Should you have questions after your visit or need to cancel or reschedule your appointment, please contact [facility]  [phone] and follow the prompts.  Office hours are 8:00 a.m. to 4:30 p.m. Monday - Friday. Please note that voicemails left after 4:00 p.m. may not be returned until the following business day.  We are closed weekends and major holidays. You have access to a nurse at all times for urgent questions. Please call the main number to the clinic [phone] and follow the prompts.  For any non-urgent questions, you may also contact your provider using MyChart. We now offer e-Visits for anyone 58 and older to request care online for non-urgent symptoms. For details visit [email or web address].   Also download the MyChart app! Go to the app store, search "MyChart", open the app, select , and log in with your MyChart username and password.

## 2022-09-11 ENCOUNTER — Other Ambulatory Visit: Payer: Self-pay | Admitting: Neurology

## 2022-09-12 ENCOUNTER — Encounter: Payer: Self-pay | Admitting: Hematology & Oncology

## 2022-09-12 NOTE — Telephone Encounter (Signed)
Requested Prescriptions   Pending Prescriptions Disp Refills   cloBAZam (ONFI) 10 MG tablet [Pharmacy Med Name: CLOBAZAM 10 MG TABLET] 30 tablet     Sig: TAKE 1 TABLET BY MOUTH EVERYDAY AT BEDTIME   Last seen 04/19/22, next appt scheduled 12/04/22 Dispenses   Shouldn't be due again until 10/11/22 Dispensed Days Supply Quantity Provider Pharmacy  CLOBAZAM 10MG  TAB 09/10/2022 30 30 tablet Windell Norfolk, MD [facility] [phone] - W...  CLOBAZAM 10 MG TABLET 08/09/2022 30 30 each Windell Norfolk, MD [facility] [phone] - W...  CLOBAZAM 10 MG TABLET 07/11/2022 30 30 each Windell Norfolk, MD [facility] [phone] - W...  CLOBAZAM 10 MG TABLET 06/14/2022 30 30 each Windell Norfolk, [phone] ...  CLOBAZAM 10 MG TABLET 05/13/2022 30 30 each Windell Norfolk, MD [facility] [phone] - W...  CLOBAZAM 10 MG TABLET 04/13/2022 30 30 each Windell Norfolk, MD [facility] [phone] - W...  CLOBAZAM 10 MG TABLET 03/15/2022 30 30 each Windell Norfolk, MD [facility] [phone] - W...  CLOBAZAM 10 MG TABLET 02/09/2022 30 30 each Windell Norfolk, [facility] [phone] - W...  CLOBAZAM 10 MG TABLET 01/02/2022 30 30 each Windell Norfolk, MD [facility] [phone] - W.[place]land KitchenMarland Kitchen

## 2022-09-14 ENCOUNTER — Other Ambulatory Visit: Payer: Self-pay | Admitting: *Deleted

## 2022-09-14 DIAGNOSIS — Z9189 Other specified personal risk factors, not elsewhere classified: Secondary | ICD-10-CM

## 2022-09-14 DIAGNOSIS — G4739 Other sleep apnea: Secondary | ICD-10-CM

## 2022-09-14 DIAGNOSIS — E782 Mixed hyperlipidemia: Secondary | ICD-10-CM

## 2022-09-14 DIAGNOSIS — C642 Malignant neoplasm of left kidney, except renal pelvis: Secondary | ICD-10-CM

## 2022-09-18 ENCOUNTER — Other Ambulatory Visit: Payer: Self-pay | Admitting: Neurology

## 2022-09-27 ENCOUNTER — Other Ambulatory Visit: Payer: Self-pay | Admitting: Adult Health

## 2022-10-03 ENCOUNTER — Encounter: Payer: Self-pay | Admitting: Hematology & Oncology

## 2022-10-09 ENCOUNTER — Other Ambulatory Visit: Payer: Self-pay | Admitting: Neurology

## 2022-10-10 NOTE — Telephone Encounter (Signed)
Requested Prescriptions   Pending Prescriptions Disp Refills   cloBAZam (ONFI) 10 MG tablet [Pharmacy Med Name: CLOBAZAM 10 MG TABLET] 30 tablet 2    Sig: TAKE 1 TABLET BY MOUTH EVERYDAY AT BEDTIME   Last seen 04/19/22, next appt 12/04/22 Dispenses  Routing to provider to fill Dispensed Days Supply Quantity Provider Pharmacy  CLOBAZAM 10 MG TABLET 09/10/2022 30 30 each Windell Norfolk, MD [facility] [phone] - W...  CLOBAZAM 10 MG TABLET 08/09/2022 30 30 each Windell Norfolk, MD [facility] [phone] - W...  CLOBAZAM 10 MG TABLET 07/11/2022 30 30 each Windell Norfolk, MD [facility] [phone] - W...  CLOBAZAM 10 MG TABLET 06/14/2022 30 30 each Windell Norfolk, [facility] [phone] [facility] - ...  CLOBAZAM 10 MG TABLET 05/13/2022 30 30 each Windell Norfolk, MD [facility] [phone] - W...  CLOBAZAM 10 MG TABLET 04/13/2022 30 30 each Windell Norfolk, MD [facility] [phone] - W...  CLOBAZAM 10 MG TABLET 03/15/2022 30 30 each Windell Norfolk, MD [facility] [phone] - W...  CLOBAZAM 10 MG TABLET 02/09/2022 30 30 each Windell Norfolk, MD [facility] [phone] - W...  CLOBAZAM 10 MG TABLET 01/02/2022 30 30 each Windell Norfolk, [facility] [phone] - W.Marland KitchenMarland Kitchen

## 2022-10-11 NOTE — Discharge Instructions (Signed)

## 2022-10-12 ENCOUNTER — Ambulatory Visit
Admission: RE | Admit: 2022-10-12 | Discharge: 2022-10-12 | Disposition: A | Discharge status: Home / Self Care | Payer: BC Managed Care – PPO | Source: Ambulatory Visit | Attending: Hematology & Oncology | Admitting: Hematology & Oncology

## 2022-10-12 DIAGNOSIS — M4727 Other spondylosis with radiculopathy, lumbosacral region: Secondary | ICD-10-CM | POA: Diagnosis not present

## 2022-10-12 MED ORDER — METHYLPREDNISOLONE ACETATE 40 MG/ML INJ SUSP (RADIOLOG
80.0000 mg | Freq: Once | INTRAMUSCULAR | Status: AC
  Administered 2022-10-12: 80 mg via EPIDURAL

## 2022-10-12 MED ORDER — IOPAMIDOL (ISOVUE-M 200) INJECTION 41%
1.0000 mL | Freq: Once | INTRAMUSCULAR | Status: AC
  Administered 2022-10-12: 1 mL via EPIDURAL

## 2022-10-18 ENCOUNTER — Other Ambulatory Visit: Payer: Self-pay | Admitting: Hematology & Oncology

## 2022-10-18 ENCOUNTER — Other Ambulatory Visit (HOSPITAL_BASED_OUTPATIENT_CLINIC_OR_DEPARTMENT_OTHER): Payer: Self-pay

## 2022-10-18 ENCOUNTER — Other Ambulatory Visit: Payer: Self-pay

## 2022-10-18 MED ORDER — OXYCODONE HCL ER 15 MG PO T12A
15.0000 mg | EXTENDED_RELEASE_TABLET | Freq: Two times a day (BID) | ORAL | 0 refills | Status: DC
  Filled 2022-10-18: qty 34, 17d supply, fill #0

## 2022-10-18 MED ORDER — OXYCODONE HCL 10 MG PO TABS
10.0000 mg | ORAL_TABLET | ORAL | 0 refills | Status: DC | PRN
  Filled 2022-10-18: qty 90, 15d supply, fill #0

## 2022-10-19 ENCOUNTER — Encounter: Payer: Self-pay | Admitting: Hematology & Oncology

## 2022-10-22 ENCOUNTER — Inpatient Hospital Stay: Payer: 59

## 2022-10-22 ENCOUNTER — Inpatient Hospital Stay: Payer: 59 | Attending: Hematology & Oncology

## 2022-10-22 ENCOUNTER — Inpatient Hospital Stay (HOSPITAL_BASED_OUTPATIENT_CLINIC_OR_DEPARTMENT_OTHER): Payer: 59 | Admitting: Hematology & Oncology

## 2022-10-22 ENCOUNTER — Encounter: Payer: Self-pay | Admitting: Hematology & Oncology

## 2022-10-22 VITALS — BP 146/76 | HR 78 | Temp 99.1°F | Resp 18 | Ht 67.0 in | Wt 214.0 lb

## 2022-10-22 VITALS — BP 137/73 | HR 71 | Resp 18

## 2022-10-22 DIAGNOSIS — C642 Malignant neoplasm of left kidney, except renal pelvis: Secondary | ICD-10-CM | POA: Diagnosis not present

## 2022-10-22 DIAGNOSIS — C7951 Secondary malignant neoplasm of bone: Secondary | ICD-10-CM | POA: Insufficient documentation

## 2022-10-22 DIAGNOSIS — Z7982 Long term (current) use of aspirin: Secondary | ICD-10-CM | POA: Diagnosis not present

## 2022-10-22 DIAGNOSIS — G893 Neoplasm related pain (acute) (chronic): Secondary | ICD-10-CM | POA: Diagnosis not present

## 2022-10-22 DIAGNOSIS — Z79899 Other long term (current) drug therapy: Secondary | ICD-10-CM | POA: Diagnosis not present

## 2022-10-22 DIAGNOSIS — Z5112 Encounter for antineoplastic immunotherapy: Secondary | ICD-10-CM | POA: Diagnosis present

## 2022-10-22 LAB — CBC WITH DIFFERENTIAL (CANCER CENTER ONLY)
Abs Immature Granulocytes: 0.07 K/uL (ref 0.00–0.07)
Basophils Absolute: 0 K/uL (ref 0.0–0.1)
Basophils Relative: 1 %
Eosinophils Absolute: 0.3 K/uL (ref 0.0–0.5)
Eosinophils Relative: 4 %
HCT: 45.6 % (ref 39.0–52.0)
Hemoglobin: 15.3 g/dL (ref 13.0–17.0)
Immature Granulocytes: 1 %
Lymphocytes Relative: 19 %
Lymphs Abs: 1.5 K/uL (ref 0.7–4.0)
MCH: 31.3 pg (ref 26.0–34.0)
MCHC: 33.6 g/dL (ref 30.0–36.0)
MCV: 93.3 fL (ref 80.0–100.0)
Monocytes Absolute: 0.4 K/uL (ref 0.1–1.0)
Monocytes Relative: 5 %
Neutro Abs: 5.4 K/uL (ref 1.7–7.7)
Neutrophils Relative %: 70 %
Platelet Count: 199 K/uL (ref 150–400)
RBC: 4.89 MIL/uL (ref 4.22–5.81)
RDW: 13.2 % (ref 11.5–15.5)
WBC Count: 7.6 K/uL (ref 4.0–10.5)
nRBC: 0 % (ref 0.0–0.2)

## 2022-10-22 LAB — CMP (CANCER CENTER ONLY)
ALT: 17 U/L (ref 0–44)
AST: 14 U/L — ABNORMAL LOW (ref 15–41)
Albumin: 4.3 g/dL (ref 3.5–5.0)
Alkaline Phosphatase: 83 U/L (ref 38–126)
Anion gap: 9 (ref 5–15)
BUN: 16 mg/dL (ref 6–20)
CO2: 27 mmol/L (ref 22–32)
Calcium: 9 mg/dL (ref 8.9–10.3)
Chloride: 103 mmol/L (ref 98–111)
Creatinine: 0.99 mg/dL (ref 0.61–1.24)
GFR, Estimated: >60 mL/min
Glucose, Bld: 136 mg/dL — ABNORMAL HIGH (ref 70–99)
Potassium: 3.8 mmol/L (ref 3.5–5.1)
Sodium: 139 mmol/L (ref 135–145)
Total Bilirubin: 0.4 mg/dL (ref 0.3–1.2)
Total Protein: 6.6 g/dL (ref 6.5–8.1)

## 2022-10-22 LAB — TSH: TSH: 2.384 u[IU]/mL (ref 0.350–4.500)

## 2022-10-22 LAB — LACTATE DEHYDROGENASE: LDH: 143 U/L (ref 98–192)

## 2022-10-22 MED ORDER — HEPARIN SOD (PORK) LOCK FLUSH 100 UNIT/ML IV SOLN
500.0000 [IU] | Freq: Once | INTRAVENOUS | Status: AC | PRN
  Administered 2022-10-22: 500 [IU]

## 2022-10-22 MED ORDER — SODIUM CHLORIDE 0.9 % IV SOLN: Freq: Once | INTRAVENOUS | Status: AC

## 2022-10-22 MED ORDER — SODIUM CHLORIDE 0.9% FLUSH
10.0000 mL | INTRAVENOUS | Status: DC | PRN
  Administered 2022-10-22: 10 mL

## 2022-10-22 MED ORDER — SODIUM CHLORIDE 0.9 % IV SOLN
480.0000 mg | Freq: Once | INTRAVENOUS | Status: AC
  Administered 2022-10-22: 480 mg via INTRAVENOUS
  Filled 2022-10-22: qty 48

## 2022-10-22 NOTE — Patient Instructions (Signed)
[facility]  Discharge Instructions: Thank you for choosing [facility] to provide your oncology and hematology care.   If you have a lab appointment with the Cancer Center, please go directly to the Cancer Center and check in at the registration area.  Wear comfortable clothing and clothing appropriate for easy access to any Portacath or PICC line.   We strive to give you quality time with your provider. You may need to reschedule your appointment if you arrive late (15 or more minutes).  Arriving late affects you and other patients whose appointments are after yours.  Also, if you miss three or more appointments without notifying the office, you may be dismissed from the clinic at the provider's discretion.      For prescription refill requests, have your pharmacy contact our office and allow 72 hours for refills to be completed.    Today you received the following chemotherapy and/or immunotherapy agents Opdivo.      To help prevent nausea and vomiting after your treatment, we encourage you to take your nausea medication as directed.  BELOW ARE SYMPTOMS THAT SHOULD BE REPORTED IMMEDIATELY: *FEVER GREATER THAN 100.4 F (38 C) OR HIGHER *CHILLS OR SWEATING *NAUSEA AND VOMITING THAT IS NOT CONTROLLED WITH YOUR NAUSEA MEDICATION *UNUSUAL SHORTNESS OF BREATH *UNUSUAL BRUISING OR BLEEDING *URINARY PROBLEMS (pain or burning when urinating, or frequent urination) *BOWEL PROBLEMS (unusual diarrhea, constipation, pain near the anus) TENDERNESS IN MOUTH AND THROAT WITH OR WITHOUT PRESENCE OF ULCERS (sore throat, sores in mouth, or a toothache) UNUSUAL RASH, SWELLING OR PAIN  UNUSUAL VAGINAL DISCHARGE OR ITCHING   Items with * indicate a potential emergency and should be followed up as soon as possible or go to the Emergency Department if any problems should occur.  Please show the CHEMOTHERAPY ALERT CARD or IMMUNOTHERAPY ALERT CARD at check-in  to the Emergency Department and triage nurse. Should you have questions after your visit or need to cancel or reschedule your appointment, please contact [facility] [place]  [phone] and follow the prompts.  Office hours are 8:00 a.m. to 4:30 p.m. Monday - Friday. Please note that voicemails left after 4:00 p.m. may not be returned until the following business day.  We are closed weekends and major holidays. You have access to a nurse at all times for urgent questions. Please call the main number to the clinic [phone] and follow the prompts.  For any non-urgent questions, you may also contact your provider using MyChart. We now offer e-Visits for anyone 19 and older to request care online for non-urgent symptoms. For details visit [email or web address].   Also download the MyChart app! Go to the app store, search "MyChart", open the app, select [place], and log in with your MyChart username and password.

## 2022-10-22 NOTE — Progress Notes (Signed)
Hematology and Oncology Follow Up Visit  Vincent David [id number] 10/18/1965 57 y.o. 10/22/2022   Principle Diagnosis:  Metastatic Renal Cell Carcinoma - bone mets  Current Therapy:   Nivoumab/Ipilimumab -- s/p cycle #5 -- started on 09/29/2019 -- maintenance Nivolumab q 2 month -- Started on 12/29/2019 Xgeva 120 mg sq q 2 months -- next dose in 10/2022 XRT to right iliac mass/ L5 vertebral body/ Left femoral neck LEFT hip repair Cryoablation of left kidney mass-10/19/2020     Interim History:  Vincent David is back for follow-up.  He is still have some problems with the lower back and pain down the right leg.  He has been seen by Interventional Radiology.  He did have epidural injection which really did not help all that much.  Otherwise, he seems to be doing fairly well.  He has had no problems with nausea or vomiting.  He has had no change in bowel or bladder habits.  He has had no bleeding.  He has had no fever.  There has been no cough or shortness of breath.  Vincent last TSH was 2.2.  Overall, I would say that Vincent David.    Medications:  Current Outpatient Medications:    amLODipine (NORVASC) 5 MG tablet, TAKE David TABLET BY MOUTH EVERY DAY, Disp: 90 tablet, Rfl: 4   aspirin EC 81 MG tablet, Take David tablet (81 mg total) by mouth 2 (two) times daily., Disp: 60 tablet, Rfl: 0   cloBAZam (ONFI) 10 MG tablet, TAKE David TABLET BY MOUTH EVERYDAY AT BEDTIME, Disp: 30 tablet, Rfl: 5   ibuprofen (ADVIL) 200 MG tablet, Take 400 mg by mouth every 8 (eight) hours as needed (pain.)., Disp: , Rfl:    lamoTRIgine (LAMICTAL) 100 MG tablet, TAKE 2.5 TABLETS (250 MG TOTAL) BY MOUTH 2 (TWO) TIMES DAILY., Disp: 450 tablet, Rfl: 0   lamoTRIgine (LAMICTAL) 25 MG tablet, Take 25 mg by mouth 2 (two) times daily., Disp: , Rfl:    oxyCODONE (OXYCONTIN) 15 mg 12 hr tablet, Take David tablet (15 mg total) by mouth every 12 (twelve) hours., Disp: 34 tablet, Rfl: 0   Oxycodone HCl 10 MG TABS,  Take David tablet (10 mg total) by mouth every 4 (four) hours as needed., Disp: 90 tablet, Rfl: 0   SUMAtriptan (IMITREX) 50 MG tablet, Take 50 mg by mouth every 2 (two) hours as needed for migraine. May repeat in 2 hours if headache persists or recurs., Disp: , Rfl:  No current facility-administered medications for this visit.  Facility-Administered Medications Ordered in Other Visits:    sodium chloride flush (NS) 0.9 % injection 10 mL, 10 mL, Intracatheter, PRN, Josph Macho, MD, 10 mL at 05/19/20 1343   sodium chloride flush (NS) 0.9 % injection 10 mL, 10 mL, Intravenous, PRN, Josph Macho, MD, 10 mL at 08/23/22 1610  Allergies: No Known Allergies  Past Medical History, Surgical history, Social history, and Family History were reviewed and updated.  Review of Systems: Review of Systems  Constitutional: Negative.   HENT:  Negative.    Eyes: Negative.   Respiratory: Negative.    Cardiovascular: Negative.   Gastrointestinal: Negative.   Endocrine: Negative.   Musculoskeletal:  Positive for back pain and flank pain.  Skin: Negative.   Neurological: Negative.   Hematological: Negative.   Psychiatric/Behavioral: Negative.      Physical Exam:  height is 5\' 7"  (David.702 m) and weight is 214 lb (97.David kg). Vincent oral temperature  is 99.David F (37.3 C). Vincent blood pressure is 146/76 (abnormal) and Vincent pulse is 78. Vincent respiration is 18 and oxygen saturation is 100%.   Wt Readings from Last 3 Encounters:  10/22/22 214 lb (97.David kg)  08/23/22 210 lb 12.8 oz (95.6 kg)  06/08/22 214 lb (97.David kg)    Physical Exam Vitals reviewed.  HENT:     Head: Normocephalic and atraumatic.  Eyes:     Pupils: Pupils are equal, round, and reactive to light.  Cardiovascular:     Rate and Rhythm: Normal rate and regular rhythm.     Heart sounds: Normal heart sounds.  Pulmonary:     Effort: Pulmonary effort is normal.     Breath sounds: Normal breath sounds.  Abdominal:     General: Bowel sounds are  normal.     Palpations: Abdomen is soft.  Musculoskeletal:        General: No tenderness or deformity. Normal range of motion.     Cervical back: Normal range of motion.  Lymphadenopathy:     Cervical: No cervical adenopathy.  Skin:    General: Skin is warm and dry.     Findings: No erythema or rash.  Neurological:     Mental David: He is alert and oriented to person, place, and time.  Psychiatric:        Behavior: Behavior normal.        Thought Content: Thought content normal.        Judgment: Judgment normal.      Lab Results  Component Value Date   WBC 7.6 10/22/2022   HGB 15.3 10/22/2022   HCT 45.6 10/22/2022   MCV 93.3 10/22/2022   PLT 199 10/22/2022     Chemistry      Component Value Date/Time   NA 136 08/23/2022 1455   K 4.2 08/23/2022 1455   CL 102 08/23/2022 1455   CO2 25 08/23/2022 1455   BUN 22 (H) 08/23/2022 1455   CREATININE David.47 (H) 08/23/2022 1455   CREATININE David.15 08/25/2019 1450      Component Value Date/Time   CALCIUM 9.2 08/23/2022 1455   ALKPHOS 113 08/23/2022 1455   AST 18 08/23/2022 1455   ALT 18 08/23/2022 1455   BILITOT 0.5 08/23/2022 1455      Impression and Plan: Vincent David is a very nice 57 year old white male.  He has metastatic renal cell carcinoma.  For some reason, Vincent disease clearly favors Vincent bones.  Vincent lungs and liver and lymph nodes all appear to be clean.  He is doing great on immunotherapy.  I would have to think that the immunotherapy is still effective.   I just feel bad as having the problems with Vincent back.  I am just not sure what can be done for this.  I do not think surgical intervention is an option for him.  I think this would be a huge undertaking.  I do not think we need any scans probably until November.  We will plan to get him back to see [place] in a couple months.     9/16/20249:18 AM

## 2022-10-23 ENCOUNTER — Other Ambulatory Visit (HOSPITAL_BASED_OUTPATIENT_CLINIC_OR_DEPARTMENT_OTHER): Payer: Self-pay

## 2022-10-23 ENCOUNTER — Other Ambulatory Visit: Payer: Self-pay

## 2022-11-30 ENCOUNTER — Telehealth: Payer: Self-pay

## 2022-11-30 NOTE — Telephone Encounter (Signed)
Patient called stating that [facility] needed more information for [facility] to get orthopedic bed, called patient back and was provided number to the [facility] to call and see what additional information was needed. Called and was informed needed latest office note and letter stating patient would benefit from the orthopedic bed due to pain. Needs to be faxed within 10 days of the letter from [facility] which she states was 11/28/22 (information due 12/12/22)  Last office note printed and letter created, will fax Monday with MD is in office to approve letter and sign.   [facility] phone: [phone] opt 2 Fax: [phone] Attn Utilization management team Auth Number [id number] Patient ID #[id number]   Patient informed.

## 2022-12-04 ENCOUNTER — Encounter: Payer: Self-pay | Admitting: Adult Health

## 2022-12-04 ENCOUNTER — Ambulatory Visit (INDEPENDENT_AMBULATORY_CARE_PROVIDER_SITE_OTHER): Payer: BC Managed Care – PPO | Admitting: Adult Health

## 2022-12-04 VITALS — BP 137/70 | HR 80 | Ht 67.0 in | Wt 215.0 lb

## 2022-12-04 DIAGNOSIS — Z5181 Encounter for therapeutic drug level monitoring: Secondary | ICD-10-CM | POA: Diagnosis not present

## 2022-12-04 DIAGNOSIS — G4733 Obstructive sleep apnea (adult) (pediatric): Secondary | ICD-10-CM

## 2022-12-04 DIAGNOSIS — R569 Unspecified convulsions: Secondary | ICD-10-CM | POA: Diagnosis not present

## 2022-12-04 MED ORDER — LAMOTRIGINE 25 MG PO TABS: 50.0000 mg | ORAL_TABLET | Freq: Two times a day (BID) | ORAL | 3 refills | Status: DC

## 2022-12-04 MED ORDER — LAMOTRIGINE 100 MG PO TABS: 200.0000 mg | ORAL_TABLET | Freq: Two times a day (BID) | ORAL | 3 refills | Status: DC

## 2022-12-04 NOTE — Progress Notes (Signed)
PATIENT: Vincent David DOB: 1965-03-15  REASON FOR VISIT: follow up HISTORY FROM: patient PRIMARY NEUROLOGIST: Dr. Vickey Huger  Virtual Visit via Video Note  I connected with Vincent David on 12/04/22 at 10:30 AM EDT by a video enabled telemedicine application located remotely at [facility] and verified that I am speaking with the correct person using two identifiers who was located at their own home.   I discussed the limitations of evaluation and management by telemedicine and the availability of in person appointments. The patient expressed understanding and agreed to proceed.   PATIENT: Vincent David DOB: November 18, 1965  REASON FOR VISIT: follow up HISTORY FROM: patient  Chief Complaint  Patient presents with   Follow-up    Pt in 18 Pt here for CPAP and seizure f/u  Pt states no seziure since Nov 2023 Pt states no questions or concerns for today's visit      HISTORY OF PRESENT ILLNESS: Today 12/04/22:  Vincent David is a 57 y.o. male with a history of OSA on CPAP and seizures . Returns today for follow-up.  He reports that the CPAP is working well.  He denies any new issues.  He reports that since he started on clobazam he has not had any seizure events.  He remains on Lamictal 250 mg twice daily as well.  Lamictal was entered into the system as 100 mg and 25 mg tablets.  He does verify that he does take 25 mg tablets so he does not have to break the 100 mg tablet.  He does report that his sleep is fragmented.  He sleeps about 2 to 3 hours at a time may wake up but he eventually does go back to sleep.  His download is below    REVIEW OF SYSTEMS: Out of a complete 14 system review of symptoms, the patient complains only of the following symptoms, and all other reviewed systems are negative.  ALLERGIES: No Known Allergies  HOME MEDICATIONS: Outpatient Medications Prior to Visit  Medication Sig Dispense Refill   amLODipine (NORVASC) 5 MG tablet TAKE 1  TABLET BY MOUTH EVERY DAY 90 tablet 4   aspirin EC 81 MG tablet Take 1 tablet (81 mg total) by mouth 2 (two) times daily. 60 tablet 0   cloBAZam (ONFI) 10 MG tablet TAKE 1 TABLET BY MOUTH EVERYDAY AT BEDTIME 30 tablet 5   ibuprofen (ADVIL) 200 MG tablet Take 400 mg by mouth every 8 (eight) hours as needed (pain.).     lamoTRIgine (LAMICTAL) 100 MG tablet TAKE 2.5 TABLETS (250 MG TOTAL) BY MOUTH 2 (TWO) TIMES DAILY. 450 tablet 0   lamoTRIgine (LAMICTAL) 25 MG tablet Take 25 mg by mouth 2 (two) times daily.     oxyCODONE (OXYCONTIN) 15 mg 12 hr tablet Take 1 tablet (15 mg total) by mouth every 12 (twelve) hours. 34 tablet 0   Oxycodone HCl 10 MG TABS Take 1 tablet (10 mg total) by mouth every 4 (four) hours as needed. 90 tablet 0   SUMAtriptan (IMITREX) 50 MG tablet Take 50 mg by mouth every 2 (two) hours as needed for migraine. May repeat in 2 hours if headache persists or recurs.     Facility-Administered Medications Prior to Visit  Medication Dose Route Frequency Provider Last Rate Last Admin   sodium chloride flush (NS) 0.9 % injection 10 mL  10 mL Intracatheter PRN Josph Macho, MD   10 mL at 05/19/20 1343   sodium chloride flush (NS) 0.9 %  injection 10 mL  10 mL Intravenous PRN Josph Macho, MD   10 mL at 08/23/22 1610    PAST MEDICAL HISTORY: Past Medical History:  Diagnosis Date   Cancer [facility])    Cancer of kidney (HCC)    Goals of care, counseling/discussion 08/28/2019   History of COVID-19 10/03/2020   Positive COVID swab test   Postictal headache    Seizure (HCC)    Sleep apnea    Tinnitus     PAST SURGICAL HISTORY: Past Surgical History:  Procedure Laterality Date   IR IMAGING GUIDED PORT INSERTION  09/28/2019   IR RADIOLOGIST EVAL & MGMT  08/30/2020   IR RADIOLOGIST EVAL & MGMT  12/01/2020   IR RADIOLOGIST EVAL & MGMT  06/12/2021   IR RADIOLOGIST EVAL & MGMT  06/27/2021   IR RADIOLOGIST EVAL & MGMT  11/10/2021   KNEE SURGERY Left    ORIF PELVIC FRACTURE Left  09/14/2019   Procedure: OPEN REDUCTION INTERNAL FIXATION (ORIF) LEFT HIP WITH AFFIXUS NAIL;  Surgeon: Gean Birchwood, MD;  Location: [facility];  Service: Orthopedics;  Laterality: Left;   RADIOLOGY WITH ANESTHESIA N/A 10/19/2020   Procedure: CT CYROABLATION WITH ANESTHESIA;  Surgeon: Sterling Big, MD;  Location: [facility];  Service: Radiology;  Laterality: N/A;   SHOULDER SURGERY Left     FAMILY HISTORY: Family History  Problem Relation Age of Onset   Sleep apnea Mother    Diabetes Mother    High blood pressure Father    Cancer Maternal Uncle        type unknown "either pancreatic or kidney"   Seizures Neg Hx     SOCIAL HISTORY: Social History   Socioeconomic History   Marital status: Married    Spouse name: Inetta Fermo   Number of children: 3   Years of education: Not on file   Highest education level: Not on file  Occupational History    Employer: OTHER  Tobacco Use   Smoking status: Every Day    Current packs/day: 0.25    Average packs/day: 0.3 packs/day for 15.0 years (3.8 ttl pk-yrs)    Types: Cigarettes   Smokeless tobacco: Never   Tobacco comments:    "Not quite" 0.5 ppd  Vaping Use   Vaping status: Never Used  Substance and Sexual Activity   Alcohol use: Yes    Comment: occ wine   Drug use: Never   Sexual activity: Yes    Partners: Female  Other Topics Concern   Not on file  Social History Narrative   Caffeine: 2 cups/day   Social Determinants of Health   Financial Resource Strain: Not on file  Food Insecurity: Not on file  Transportation Needs: Not on file  Physical Activity: Not on file  Stress: Not on file  Social Connections: Not on file  Intimate Partner Violence: Not on file      PHYSICAL EXAM Generalized: Well developed, in no acute distress   Neurological examination  Mentation: Alert oriented to time, place, history taking. Follows all commands speech and language fluent Cranial nerve II-XII:Extraocular movements were full. Facial symmetry  noted. uvula tongue midline. Head turning and shoulder shrug  were normal and symmetric. Motor: Good strength throughout subjectively per patient Sensory: Sensory testing is intact to soft touch on all 4 extremities subjectively per patient Coordination: Cerebellar testing reveals good finger-nose-finger  Gait and station: Patient is able to stand from a seated position. gait is normal.  Reflexes: UTA  DIAGNOSTIC DATA (LABS, IMAGING, TESTING) -  I reviewed patient records, labs, notes, testing and imaging myself where available.  Lab Results  Component Value Date   WBC 7.6 10/22/2022   HGB 15.3 10/22/2022   HCT 45.6 10/22/2022   MCV 93.3 10/22/2022   PLT 199 10/22/2022      Component Value Date/Time   NA 139 10/22/2022 0824   K 3.8 10/22/2022 0824   CL 103 10/22/2022 0824   CO2 27 10/22/2022 0824   GLUCOSE 136 (H) 10/22/2022 0824   BUN 16 10/22/2022 0824   CREATININE 0.99 10/22/2022 0824   CREATININE 1.15 08/25/2019 1450   CALCIUM 9.0 10/22/2022 0824   PROT 6.6 10/22/2022 0824   ALBUMIN 4.3 10/22/2022 0824   AST 14 (L) 10/22/2022 0824   ALT 17 10/22/2022 0824   ALKPHOS 83 10/22/2022 0824   BILITOT 0.4 10/22/2022 0824   GFRNONAA >60 10/22/2022 0824   GFRNONAA 71 08/25/2019 1450   GFRAA >60 10/20/2019 1117   GFRAA 83 08/25/2019 1450   Lab Results  Component Value Date   CHOL 183 01/09/2022   HDL 29.10 (L) 01/09/2022   LDLCALC 146 (H) 02/04/2019   LDLDIRECT 140.0 01/09/2022   TRIG 204.0 (H) 01/09/2022   CHOLHDL 6 01/09/2022   Lab Results  Component Value Date   HGBA1C 6.2 01/09/2022   No results found for: "VITAMINB12" Lab Results  Component Value Date   TSH 2.384 10/22/2022      ASSESSMENT AND PLAN 57 y.o. year old male  has a past medical history of Cancer (HCC), Cancer of kidney (HCC), Goals of care, counseling/discussion (08/28/2019), History of COVID-19 (10/03/2020), Postictal headache, Seizure (HCC), Sleep apnea, and Tinnitus. here with:  OSA on  CPAP  CPAP compliance excellent Residual AHI is good Encouraged patient to continue using CPAP nightly and > 4 hours each night  Seizures  Continue Lamictal 250 mg twice a day Continue clobazam 10 mg at bedtime Blood work today Advised that if he has any seizure events he should let us know  Follow-up in 6-7 months or sooner if needed    Butch Penny, MSN, NP-C 12/04/2022, 10:47 AM [facility] [address] [place], [place] [address] [phone]

## 2022-12-04 NOTE — Patient Instructions (Addendum)
Continue Onfi and lamictal  Verify Lamictal dose at home  Blood work today Continue CPAP If you have any seizure events please let us know.

## 2022-12-05 ENCOUNTER — Encounter: Payer: Self-pay | Admitting: Hematology & Oncology

## 2022-12-05 LAB — CBC WITH DIFFERENTIAL/PLATELET
Basophils Absolute: 0.1 10*3/uL (ref 0.0–0.2)
Basos: 1 %
EOS (ABSOLUTE): 0.5 10*3/uL — ABNORMAL HIGH (ref 0.0–0.4)
Eos: 6 %
Hematocrit: 49.2 % (ref 37.5–51.0)
Hemoglobin: 16.1 g/dL (ref 13.0–17.7)
Immature Grans (Abs): 0.1 10*3/uL (ref 0.0–0.1)
Immature Granulocytes: 1 %
Lymphocytes Absolute: 1.5 10*3/uL (ref 0.7–3.1)
Lymphs: 19 %
MCH: 31.1 pg (ref 26.6–33.0)
MCHC: 32.7 g/dL (ref 31.5–35.7)
MCV: 95 fL (ref 79–97)
Monocytes Absolute: 0.5 10*3/uL (ref 0.1–0.9)
Monocytes: 7 %
Neutrophils Absolute: 5.2 10*3/uL (ref 1.4–7.0)
Neutrophils: 66 %
Platelets: 245 10*3/uL (ref 150–450)
RBC: 5.18 x10E6/uL (ref 4.14–5.80)
RDW: 13 % (ref 11.6–15.4)
WBC: 7.8 10*3/uL (ref 3.4–10.8)

## 2022-12-05 LAB — COMPREHENSIVE METABOLIC PANEL
ALT: 20 [IU]/L (ref 0–44)
AST: 19 [IU]/L (ref 0–40)
Albumin: 4.5 g/dL (ref 3.8–4.9)
Alkaline Phosphatase: 133 [IU]/L — ABNORMAL HIGH (ref 44–121)
BUN/Creatinine Ratio: 14 (ref 9–20)
BUN: 15 mg/dL (ref 6–24)
Bilirubin Total: 0.3 mg/dL (ref 0.0–1.2)
CO2: 23 mmol/L (ref 20–29)
Calcium: 9.5 mg/dL (ref 8.7–10.2)
Chloride: 98 mmol/L (ref 96–106)
Creatinine, Ser: 1.1 mg/dL (ref 0.76–1.27)
Globulin, Total: 2.2 g/dL (ref 1.5–4.5)
Glucose: 109 mg/dL — ABNORMAL HIGH (ref 70–99)
Potassium: 4.6 mmol/L (ref 3.5–5.2)
Sodium: 137 mmol/L (ref 134–144)
Total Protein: 6.7 g/dL (ref 6.0–8.5)
eGFR: 77 mL/min/{1.73_m2} (ref >59)

## 2022-12-05 LAB — LAMOTRIGINE LEVEL: Lamotrigine Lvl: 6.9 ug/mL (ref 2.0–20.0)

## 2022-12-06 ENCOUNTER — Encounter: Payer: Self-pay | Admitting: Adult Health

## 2022-12-06 ENCOUNTER — Other Ambulatory Visit: Payer: Self-pay

## 2022-12-06 ENCOUNTER — Encounter: Payer: Self-pay | Admitting: Hematology & Oncology

## 2022-12-06 ENCOUNTER — Telehealth: Payer: Self-pay | Admitting: *Deleted

## 2022-12-06 DIAGNOSIS — R569 Unspecified convulsions: Secondary | ICD-10-CM

## 2022-12-06 NOTE — Telephone Encounter (Signed)
Spoke to patient gave labwork results informed pt  Per Megan,NP please come back in 1 ,month to repeat Alkaline phosphatase lab Pt expressed understanding and thanked me for calling . Orders place for repeat lab

## 2022-12-06 NOTE — Telephone Encounter (Signed)
-----   Message from [facility] sent at 12/06/2022  3:04 PM EDT ----- Alkaline phosphatase is slightly elevated was repeat in 1 month

## 2022-12-07 ENCOUNTER — Other Ambulatory Visit: Payer: Self-pay | Admitting: *Deleted

## 2022-12-07 DIAGNOSIS — C642 Malignant neoplasm of left kidney, except renal pelvis: Secondary | ICD-10-CM

## 2022-12-14 ENCOUNTER — Telehealth: Payer: Self-pay | Admitting: *Deleted

## 2022-12-14 ENCOUNTER — Other Ambulatory Visit (HOSPITAL_BASED_OUTPATIENT_CLINIC_OR_DEPARTMENT_OTHER): Payer: Self-pay

## 2022-12-14 ENCOUNTER — Other Ambulatory Visit: Payer: Self-pay | Admitting: Hematology & Oncology

## 2022-12-14 ENCOUNTER — Other Ambulatory Visit: Payer: Self-pay | Admitting: *Deleted

## 2022-12-14 DIAGNOSIS — C642 Malignant neoplasm of left kidney, except renal pelvis: Secondary | ICD-10-CM

## 2022-12-14 MED ORDER — OXYCODONE HCL 10 MG PO TABS
10.0000 mg | ORAL_TABLET | ORAL | 0 refills | Status: DC | PRN
  Filled 2022-12-14: qty 90, 15d supply, fill #0

## 2022-12-14 MED ORDER — OXYCODONE HCL ER 15 MG PO T12A
15.0000 mg | EXTENDED_RELEASE_TABLET | Freq: Two times a day (BID) | ORAL | 0 refills | Status: DC
  Filled 2022-12-14: qty 34, 17d supply, fill #0

## 2022-12-14 NOTE — Telephone Encounter (Signed)
Received a call from Jaclynn Major wife requesting that DME bed information be sent to [facility].  Last Medical note and Rx as well as letter sent to [facility] Fax [phone] with Attn order # [id number].  [facility] # is [phone] in case it is needed.

## 2022-12-17 ENCOUNTER — Inpatient Hospital Stay: Payer: 59

## 2022-12-17 ENCOUNTER — Inpatient Hospital Stay: Payer: 59 | Attending: Hematology & Oncology

## 2022-12-17 ENCOUNTER — Encounter: Payer: Self-pay | Admitting: Hematology & Oncology

## 2022-12-17 ENCOUNTER — Inpatient Hospital Stay (HOSPITAL_BASED_OUTPATIENT_CLINIC_OR_DEPARTMENT_OTHER): Payer: 59 | Admitting: Hematology & Oncology

## 2022-12-17 ENCOUNTER — Other Ambulatory Visit: Payer: Self-pay | Admitting: *Deleted

## 2022-12-17 ENCOUNTER — Encounter: Payer: Self-pay | Admitting: *Deleted

## 2022-12-17 ENCOUNTER — Other Ambulatory Visit: Payer: Self-pay

## 2022-12-17 VITALS — BP 135/63 | HR 86 | Temp 98.2°F | Resp 18 | Ht 67.0 in | Wt 216.0 lb

## 2022-12-17 VITALS — BP 127/63 | HR 69 | Resp 17

## 2022-12-17 DIAGNOSIS — R200 Anesthesia of skin: Secondary | ICD-10-CM | POA: Insufficient documentation

## 2022-12-17 DIAGNOSIS — C7951 Secondary malignant neoplasm of bone: Secondary | ICD-10-CM | POA: Insufficient documentation

## 2022-12-17 DIAGNOSIS — Z79899 Other long term (current) drug therapy: Secondary | ICD-10-CM | POA: Diagnosis not present

## 2022-12-17 DIAGNOSIS — Z7982 Long term (current) use of aspirin: Secondary | ICD-10-CM | POA: Insufficient documentation

## 2022-12-17 DIAGNOSIS — C642 Malignant neoplasm of left kidney, except renal pelvis: Secondary | ICD-10-CM

## 2022-12-17 DIAGNOSIS — Z5112 Encounter for antineoplastic immunotherapy: Secondary | ICD-10-CM | POA: Diagnosis present

## 2022-12-17 DIAGNOSIS — Z9189 Other specified personal risk factors, not elsewhere classified: Secondary | ICD-10-CM

## 2022-12-17 LAB — CBC WITH DIFFERENTIAL (CANCER CENTER ONLY)
Abs Immature Granulocytes: 0.05 10*3/uL (ref 0.00–0.07)
Basophils Absolute: 0 10*3/uL (ref 0.0–0.1)
Basophils Relative: 1 %
Eosinophils Absolute: 0.4 10*3/uL (ref 0.0–0.5)
Eosinophils Relative: 5 %
HCT: 42.4 % (ref 39.0–52.0)
Hemoglobin: 14.7 g/dL (ref 13.0–17.0)
Immature Granulocytes: 1 %
Lymphocytes Relative: 19 %
Lymphs Abs: 1.4 10*3/uL (ref 0.7–4.0)
MCH: 31.7 pg (ref 26.0–34.0)
MCHC: 34.7 g/dL (ref 30.0–36.0)
MCV: 91.4 fL (ref 80.0–100.0)
Monocytes Absolute: 0.3 10*3/uL (ref 0.1–1.0)
Monocytes Relative: 5 %
Neutro Abs: 5.3 10*3/uL (ref 1.7–7.7)
Neutrophils Relative %: 69 %
Platelet Count: 215 10*3/uL (ref 150–400)
RBC: 4.64 MIL/uL (ref 4.22–5.81)
RDW: 13.2 % (ref 11.5–15.5)
WBC Count: 7.5 10*3/uL (ref 4.0–10.5)
nRBC: 0 % (ref 0.0–0.2)

## 2022-12-17 LAB — CMP (CANCER CENTER ONLY)
ALT: 17 U/L (ref 0–44)
AST: 16 U/L (ref 15–41)
Albumin: 4.5 g/dL (ref 3.5–5.0)
Alkaline Phosphatase: 106 U/L (ref 38–126)
Anion gap: 7 (ref 5–15)
BUN: 16 mg/dL (ref 6–20)
CO2: 28 mmol/L (ref 22–32)
Calcium: 9.3 mg/dL (ref 8.9–10.3)
Chloride: 103 mmol/L (ref 98–111)
Creatinine: 1.01 mg/dL (ref 0.61–1.24)
GFR, Estimated: >60 mL/min (ref >60)
Glucose, Bld: 155 mg/dL — ABNORMAL HIGH (ref 70–99)
Potassium: 3.9 mmol/L (ref 3.5–5.1)
Sodium: 138 mmol/L (ref 135–145)
Total Bilirubin: 0.4 mg/dL (ref <1.2)
Total Protein: 6.8 g/dL (ref 6.5–8.1)

## 2022-12-17 LAB — LACTATE DEHYDROGENASE: LDH: 171 U/L (ref 98–192)

## 2022-12-17 LAB — TSH: TSH: 2.451 u[IU]/mL (ref 0.350–4.500)

## 2022-12-17 MED ORDER — DENOSUMAB 120 MG/1.7ML ~~LOC~~ SOLN
120.0000 mg | Freq: Once | SUBCUTANEOUS | Status: AC
  Administered 2022-12-17: 120 mg via SUBCUTANEOUS
  Filled 2022-12-17: qty 1.7

## 2022-12-17 MED ORDER — SODIUM CHLORIDE 0.9 % IV SOLN: Freq: Once | INTRAVENOUS | Status: AC

## 2022-12-17 MED ORDER — SODIUM CHLORIDE 0.9 % IV SOLN
480.0000 mg | Freq: Once | INTRAVENOUS | Status: AC
  Administered 2022-12-17: 480 mg via INTRAVENOUS
  Filled 2022-12-17: qty 48

## 2022-12-17 NOTE — Patient Instructions (Signed)

## 2022-12-17 NOTE — Patient Instructions (Signed)
 [facility] - A DEPT OF [facility]  Discharge Instructions: Thank you for choosing [facility] to provide your oncology and hematology care.   If you have a lab appointment with the Cancer Center, please go directly to the Cancer Center and check in at the registration area.  Wear comfortable clothing and clothing appropriate for easy access to any Portacath or PICC line.   We strive to give you quality time with your provider. You may need to reschedule your appointment if you arrive late (15 or more minutes).  Arriving late affects you and other patients whose appointments are after yours.  Also, if you miss three or more appointments without notifying the office, you may be dismissed from the clinic at the provider's discretion.      For prescription refill requests, have your pharmacy contact our office and allow 72 hours for refills to be completed.    Today you received the following chemotherapy and/or immunotherapy agents Opdivo      To help prevent nausea and vomiting after your treatment, we encourage you to take your nausea medication as directed.  BELOW ARE SYMPTOMS THAT SHOULD BE REPORTED IMMEDIATELY: *FEVER GREATER THAN 100.4 F (38 C) OR HIGHER *CHILLS OR SWEATING *NAUSEA AND VOMITING THAT IS NOT CONTROLLED WITH YOUR NAUSEA MEDICATION *UNUSUAL SHORTNESS OF BREATH *UNUSUAL BRUISING OR BLEEDING *URINARY PROBLEMS (pain or burning when urinating, or frequent urination) *BOWEL PROBLEMS (unusual diarrhea, constipation, pain near the anus) TENDERNESS IN MOUTH AND THROAT WITH OR WITHOUT PRESENCE OF ULCERS (sore throat, sores in mouth, or a toothache) UNUSUAL RASH, SWELLING OR PAIN  UNUSUAL VAGINAL DISCHARGE OR ITCHING   Items with * indicate a potential emergency and should be followed up as soon as possible or go to the Emergency Department if any problems should occur.  Please show the CHEMOTHERAPY ALERT CARD or IMMUNOTHERAPY  ALERT CARD at check-in to the Emergency Department and triage nurse. Should you have questions after your visit or need to cancel or reschedule your appointment, please contact [place] [facility] - A DEPT OF [facility]  [phone] and follow the prompts.  Office hours are 8:00 a.m. to 4:30 p.m. Monday - Friday. Please note that voicemails left after 4:00 p.m. may not be returned until the following business day.  We are closed weekends and major holidays. You have access to a nurse at all times for urgent questions. Please call the main number to the clinic [phone] and follow the prompts.  For any non-urgent questions, you may also contact your provider using MyChart. We now offer e-Visits for anyone 77 and older to request care online for non-urgent symptoms. For details visit [email or web address].   Also download the MyChart app! Go to the app store, search "MyChart", open the app, select [place], and log in with your MyChart username and password.

## 2022-12-17 NOTE — Progress Notes (Signed)
Hematology and Oncology Follow Up Visit  Vincent David [id number] May 10, 1965 57 y.o. 12/17/2022   Principle Diagnosis:  Metastatic Renal Cell Carcinoma - bone mets  Current Therapy:   Nivoumab/Ipilimumab -- s/p cycle #5 -- started on 09/29/2019 -- maintenance Nivolumab q 2 month -- Started on 12/29/2019 Xgeva 120 mg sq q 2 months -- next dose in 10/2022 XRT to right iliac mass/ L5 vertebral body/ Left femoral neck LEFT hip repair Cryoablation of left kidney mass-10/19/2020     Interim History:  Vincent David is back for follow-up.  Unfortunately, he still having problems with the back.  He still having some radicular pain and numbness in the legs.  He does have the spinal med.  He has received radiotherapy to this area.  He really help if he had an adjustable bed.  I think this would really help his quality of life and would help his overall functional state.  Otherwise, he is feeling okay.  He has had no problems with cough or shortness of breath.  He has had no issues with nausea or vomiting.  He has had no change in bowel or bladder habits.  He has had no leg swelling.  He has had no headache.  He has been followed by Interventional Radiology to try to help with his back.  I know they have given him injections which really have not at all that helpful.  His TSH today was 2.5.  Currently, I would say his performance status is probably ECOG 1.     Medications:  Current Outpatient Medications:    amLODipine (NORVASC) 5 MG tablet, TAKE 1 TABLET BY MOUTH EVERY DAY, Disp: 90 tablet, Rfl: 4   aspirin EC 81 MG tablet, Take 1 tablet (81 mg total) by mouth 2 (two) times daily., Disp: 60 tablet, Rfl: 0   cloBAZam (ONFI) 10 MG tablet, TAKE 1 TABLET BY MOUTH EVERYDAY AT BEDTIME, Disp: 30 tablet, Rfl: 5   ibuprofen (ADVIL) 200 MG tablet, Take 400 mg by mouth every 8 (eight) hours as needed (pain.)., Disp: , Rfl:    lamoTRIgine (LAMICTAL) 100 MG tablet, Take 2 tablets (200 mg total) by mouth 2  (two) times daily., Disp: 360 tablet, Rfl: 3   lamoTRIgine (LAMICTAL) 25 MG tablet, Take 2 tablets (50 mg total) by mouth 2 (two) times daily., Disp: 360 tablet, Rfl: 3   oxyCODONE (OXYCONTIN) 15 mg 12 hr tablet, Take 1 tablet (15 mg total) by mouth every 12 (twelve) hours., Disp: 34 tablet, Rfl: 0   Oxycodone HCl 10 MG TABS, Take 1 tablet (10 mg total) by mouth every 4 (four) hours as needed., Disp: 90 tablet, Rfl: 0   SUMAtriptan (IMITREX) 50 MG tablet, Take 50 mg by mouth every 2 (two) hours as needed for migraine. May repeat in 2 hours if headache persists or recurs., Disp: , Rfl:  No current facility-administered medications for this visit.  Facility-Administered Medications Ordered in Other Visits:    sodium chloride flush (NS) 0.9 % injection 10 mL, 10 mL, Intracatheter, PRN, Josph Macho, MD, 10 mL at 05/19/20 1343   sodium chloride flush (NS) 0.9 % injection 10 mL, 10 mL, Intravenous, PRN, Josph Macho, MD, 10 mL at 08/23/22 1610  Allergies: No Known Allergies  Past Medical History, Surgical history, Social history, and Family History were reviewed and updated.  Review of Systems: Review of Systems  Constitutional: Negative.   HENT:  Negative.    Eyes: Negative.   Respiratory: Negative.  Cardiovascular: Negative.   Gastrointestinal: Negative.   Endocrine: Negative.   Musculoskeletal:  Positive for back pain and flank pain.  Skin: Negative.   Neurological: Negative.   Hematological: Negative.   Psychiatric/Behavioral: Negative.      Physical Exam:  height is 5\' 7"  (1.702 m) and weight is 216 lb (98 kg). His oral temperature is 98.2 F (36.8 C). His blood pressure is 135/63 and his pulse is 86. His respiration is 18 and oxygen saturation is 97%.   Wt Readings from Last 3 Encounters:  12/17/22 216 lb (98 kg)  12/04/22 215 lb (97.5 kg)  10/22/22 214 lb (97.1 kg)    Physical Exam Vitals reviewed.  HENT:     Head: Normocephalic and atraumatic.  Eyes:      Pupils: Pupils are equal, round, and reactive to light.  Cardiovascular:     Rate and Rhythm: Normal rate and regular rhythm.     Heart sounds: Normal heart sounds.  Pulmonary:     Effort: Pulmonary effort is normal.     Breath sounds: Normal breath sounds.  Abdominal:     General: Bowel sounds are normal.     Palpations: Abdomen is soft.  Musculoskeletal:        General: No tenderness or deformity. Normal range of motion.     Cervical back: Normal range of motion.  Lymphadenopathy:     Cervical: No cervical adenopathy.  Skin:    General: Skin is warm and dry.     Findings: No erythema or rash.  Neurological:     Mental Status: He is alert and oriented to person, place, and time.  Psychiatric:        Behavior: Behavior normal.        Thought Content: Thought content normal.        Judgment: Judgment normal.      Lab Results  Component Value Date   WBC 7.5 12/17/2022   HGB 14.7 12/17/2022   HCT 42.4 12/17/2022   MCV 91.4 12/17/2022   PLT 215 12/17/2022     Chemistry      Component Value Date/Time   NA 137 12/04/2022 1132   K 4.6 12/04/2022 1132   CL 98 12/04/2022 1132   CO2 23 12/04/2022 1132   BUN 15 12/04/2022 1132   CREATININE 1.10 12/04/2022 1132   CREATININE 0.99 10/22/2022 0824   CREATININE 1.15 08/25/2019 1450      Component Value Date/Time   CALCIUM 9.5 12/04/2022 1132   ALKPHOS 133 (H) 12/04/2022 1132   AST 19 12/04/2022 1132   AST 14 (L) 10/22/2022 0824   ALT 20 12/04/2022 1132   ALT 17 10/22/2022 0824   BILITOT 0.3 12/04/2022 1132   BILITOT 0.4 10/22/2022 0824      Impression and Plan: Vincent David is a very nice 57 year old white male.  He has metastatic renal cell carcinoma.  For some reason, his disease clearly favors his bones.  His lungs and liver and lymph nodes all appear to be clean.  He is doing great on immunotherapy.  I would have to think that the immunotherapy is still effective.   I just feel bad as having the problems with his  back.  I am just not sure what can be done for this.  I do not think surgical intervention is an option for him.  I think this would be a huge undertaking.  However, if this issue does not get better, we may have to actually think about this.  Again I know this would be incredibly complicated.  This might be a process that needs to be taken care of at a [facility].  We will set him up with a PET scan.  His last was back in July.  We will see what the PET scan shows.  Will plan to get back to see [place] in another 6 weeks.   11/11/20249:52 AM

## 2022-12-24 DIAGNOSIS — G4733 Obstructive sleep apnea (adult) (pediatric): Secondary | ICD-10-CM | POA: Diagnosis not present

## 2022-12-25 ENCOUNTER — Encounter: Payer: Self-pay | Admitting: Hematology & Oncology

## 2022-12-31 NOTE — Discharge Instructions (Signed)

## 2023-01-01 ENCOUNTER — Ambulatory Visit
Admission: RE | Admit: 2023-01-01 | Discharge: 2023-01-01 | Disposition: A | Discharge status: Home / Self Care | Payer: BC Managed Care – PPO | Source: Ambulatory Visit | Attending: Hematology & Oncology | Admitting: Hematology & Oncology

## 2023-01-01 ENCOUNTER — Encounter: Payer: Self-pay | Admitting: Hematology & Oncology

## 2023-01-01 DIAGNOSIS — M4727 Other spondylosis with radiculopathy, lumbosacral region: Secondary | ICD-10-CM | POA: Diagnosis not present

## 2023-01-01 DIAGNOSIS — C642 Malignant neoplasm of left kidney, except renal pelvis: Secondary | ICD-10-CM

## 2023-01-01 MED ORDER — METHYLPREDNISOLONE ACETATE 40 MG/ML INJ SUSP (RADIOLOG
80.0000 mg | Freq: Once | INTRAMUSCULAR | Status: AC
  Administered 2023-01-01: 80 mg via EPIDURAL

## 2023-01-01 MED ORDER — IOPAMIDOL (ISOVUE-M 200) INJECTION 41%
1.0000 mL | Freq: Once | INTRAMUSCULAR | Status: AC
Start: 2023-01-01 — End: 2023-01-01
  Administered 2023-01-01: 1 mL via EPIDURAL

## 2023-01-11 ENCOUNTER — Encounter: Payer: Self-pay | Admitting: Hematology & Oncology

## 2023-01-14 ENCOUNTER — Encounter (HOSPITAL_COMMUNITY)
Admission: RE | Admit: 2023-01-14 | Discharge: 2023-01-14 | Disposition: A | Discharge status: Home / Self Care | Payer: 59 | Source: Ambulatory Visit | Attending: Hematology & Oncology | Admitting: Hematology & Oncology

## 2023-01-14 DIAGNOSIS — C642 Malignant neoplasm of left kidney, except renal pelvis: Secondary | ICD-10-CM | POA: Insufficient documentation

## 2023-01-14 LAB — GLUCOSE, CAPILLARY: Glucose-Capillary: 121 mg/dL — ABNORMAL HIGH (ref 70–99)

## 2023-01-14 MED ORDER — FLUDEOXYGLUCOSE F - 18 (FDG) INJECTION
10.7400 | Freq: Once | INTRAVENOUS | Status: AC
  Administered 2023-01-14: 10.74 via INTRAVENOUS

## 2023-01-23 ENCOUNTER — Other Ambulatory Visit: Payer: Self-pay | Admitting: Hematology & Oncology

## 2023-01-24 ENCOUNTER — Telehealth: Payer: Self-pay

## 2023-01-24 NOTE — Telephone Encounter (Signed)
Advised via MyChart.

## 2023-01-24 NOTE — Telephone Encounter (Signed)
-----   Message from Josph Macho sent at 01/23/2023  5:49 PM EST ----- Please call and let him know that the PET scan does not show any obvious active cancer.  Fantastic job.  Cindee Lame

## 2023-01-28 ENCOUNTER — Encounter: Payer: Self-pay | Admitting: Hematology & Oncology

## 2023-01-28 ENCOUNTER — Other Ambulatory Visit: Payer: Self-pay | Admitting: *Deleted

## 2023-01-28 ENCOUNTER — Inpatient Hospital Stay: Payer: 59

## 2023-01-28 ENCOUNTER — Inpatient Hospital Stay (HOSPITAL_BASED_OUTPATIENT_CLINIC_OR_DEPARTMENT_OTHER): Payer: 59 | Admitting: Hematology & Oncology

## 2023-01-28 ENCOUNTER — Other Ambulatory Visit (HOSPITAL_BASED_OUTPATIENT_CLINIC_OR_DEPARTMENT_OTHER): Payer: Self-pay

## 2023-01-28 ENCOUNTER — Inpatient Hospital Stay: Payer: 59 | Attending: Hematology & Oncology

## 2023-01-28 ENCOUNTER — Other Ambulatory Visit: Payer: Self-pay

## 2023-01-28 VITALS — BP 142/61 | HR 86 | Temp 98.6°F | Resp 18 | Ht 67.0 in | Wt 216.0 lb

## 2023-01-28 DIAGNOSIS — Z5112 Encounter for antineoplastic immunotherapy: Secondary | ICD-10-CM | POA: Diagnosis present

## 2023-01-28 DIAGNOSIS — Z7982 Long term (current) use of aspirin: Secondary | ICD-10-CM | POA: Diagnosis not present

## 2023-01-28 DIAGNOSIS — Z79899 Other long term (current) drug therapy: Secondary | ICD-10-CM | POA: Insufficient documentation

## 2023-01-28 DIAGNOSIS — C7951 Secondary malignant neoplasm of bone: Secondary | ICD-10-CM | POA: Diagnosis not present

## 2023-01-28 DIAGNOSIS — C642 Malignant neoplasm of left kidney, except renal pelvis: Secondary | ICD-10-CM | POA: Insufficient documentation

## 2023-01-28 DIAGNOSIS — R200 Anesthesia of skin: Secondary | ICD-10-CM | POA: Diagnosis not present

## 2023-01-28 LAB — CBC WITH DIFFERENTIAL (CANCER CENTER ONLY)
Abs Immature Granulocytes: 0.04 10*3/uL (ref 0.00–0.07)
Basophils Absolute: 0.1 10*3/uL (ref 0.0–0.1)
Basophils Relative: 1 %
Eosinophils Absolute: 0.4 10*3/uL (ref 0.0–0.5)
Eosinophils Relative: 6 %
HCT: 42 % (ref 39.0–52.0)
Hemoglobin: 14.4 g/dL (ref 13.0–17.0)
Immature Granulocytes: 1 %
Lymphocytes Relative: 17 %
Lymphs Abs: 1.2 10*3/uL (ref 0.7–4.0)
MCH: 31.5 pg (ref 26.0–34.0)
MCHC: 34.3 g/dL (ref 30.0–36.0)
MCV: 91.9 fL (ref 80.0–100.0)
Monocytes Absolute: 0.4 10*3/uL (ref 0.1–1.0)
Monocytes Relative: 6 %
Neutro Abs: 5.3 10*3/uL (ref 1.7–7.7)
Neutrophils Relative %: 69 %
Platelet Count: 230 10*3/uL (ref 150–400)
RBC: 4.57 MIL/uL (ref 4.22–5.81)
RDW: 13.2 % (ref 11.5–15.5)
WBC Count: 7.5 10*3/uL (ref 4.0–10.5)
nRBC: 0 % (ref 0.0–0.2)

## 2023-01-28 LAB — CMP (CANCER CENTER ONLY)
ALT: 15 U/L (ref 0–44)
AST: 16 U/L (ref 15–41)
Albumin: 4.2 g/dL (ref 3.5–5.0)
Alkaline Phosphatase: 106 U/L (ref 38–126)
Anion gap: 7 (ref 5–15)
BUN: 13 mg/dL (ref 6–20)
CO2: 26 mmol/L (ref 22–32)
Calcium: 9.2 mg/dL (ref 8.9–10.3)
Chloride: 104 mmol/L (ref 98–111)
Creatinine: 0.99 mg/dL (ref 0.61–1.24)
GFR, Estimated: >60 mL/min (ref >60)
Glucose, Bld: 121 mg/dL — ABNORMAL HIGH (ref 70–99)
Potassium: 4.1 mmol/L (ref 3.5–5.1)
Sodium: 137 mmol/L (ref 135–145)
Total Bilirubin: 0.4 mg/dL (ref <1.2)
Total Protein: 6.6 g/dL (ref 6.5–8.1)

## 2023-01-28 LAB — LACTATE DEHYDROGENASE: LDH: 186 U/L (ref 98–192)

## 2023-01-28 LAB — TSH: TSH: 1.743 u[IU]/mL (ref 0.350–4.500)

## 2023-01-28 MED ORDER — HEPARIN SOD (PORK) LOCK FLUSH 100 UNIT/ML IV SOLN
500.0000 [IU] | Freq: Once | INTRAVENOUS | Status: AC | PRN
  Administered 2023-01-28: 500 [IU]

## 2023-01-28 MED ORDER — SODIUM CHLORIDE 0.9% FLUSH
10.0000 mL | INTRAVENOUS | Status: DC | PRN
  Administered 2023-01-28: 10 mL

## 2023-01-28 MED ORDER — OXYCODONE HCL ER 15 MG PO T12A
15.0000 mg | EXTENDED_RELEASE_TABLET | Freq: Two times a day (BID) | ORAL | 0 refills | Status: DC
  Filled 2023-01-28 (×2): qty 34, 17d supply, fill #0

## 2023-01-28 MED ORDER — SODIUM CHLORIDE 0.9 % IV SOLN: Freq: Once | INTRAVENOUS | Status: AC

## 2023-01-28 MED ORDER — OXYCODONE HCL 10 MG PO TABS
10.0000 mg | ORAL_TABLET | ORAL | 0 refills | Status: DC | PRN
  Filled 2023-01-28: qty 90, 15d supply, fill #0

## 2023-01-28 MED ORDER — SODIUM CHLORIDE 0.9 % IV SOLN
480.0000 mg | Freq: Once | INTRAVENOUS | Status: AC
  Administered 2023-01-28: 480 mg via INTRAVENOUS
  Filled 2023-01-28: qty 48

## 2023-01-28 NOTE — Progress Notes (Signed)
Hematology and Oncology Follow Up Visit  Vincent David [id number] [birth year] 57 y.o. 01/28/2023   Principle Diagnosis:  Metastatic Renal Cell Carcinoma - bone mets  Current Therapy:   Nivoumab/Ipilimumab -- s/p cycle #5 -- started on 09/29/2019 -- maintenance Nivolumab q 2 month -- Started on 12/29/2019 Xgeva 120 mg sq q 2 months -- next dose in 03/2023  XRT to right iliac mass/ L5 vertebral body/ Left femoral neck LEFT hip repair Cryoablation of left kidney mass-10/19/2020     Interim History:  Vincent David is back for follow-up.  Unfortunately, he still having problems with the back.  He still having some radicular pain and numbness in the left leg.  He does have the spinal med.  He has received radiotherapy to this area.  We did going set him up with a PET scan.  The PET scan was done on 01/14/2023.  PET scan not show any evidence of active malignancy.  He has had no problems with cough.  There is been no change in bowel or bladder habits.  He has had no shortness of breath.  He has had no rashes.  There has been no leg swelling.  He has had no fever.  He has had no headache.  Overall, I would have to say that his performance status is probably ECOG 1.      Medications:  Current Outpatient Medications:    amLODipine (NORVASC) 5 MG tablet, TAKE 1 TABLET BY MOUTH EVERY DAY, Disp: 90 tablet, Rfl: 5   aspirin EC 81 MG tablet, Take 1 tablet (81 mg total) by mouth 2 (two) times daily., Disp: 60 tablet, Rfl: 0   cloBAZam (ONFI) 10 MG tablet, TAKE 1 TABLET BY MOUTH EVERYDAY AT BEDTIME, Disp: 30 tablet, Rfl: 5   ibuprofen (ADVIL) 200 MG tablet, Take 400 mg by mouth every 8 (eight) hours as needed (pain.)., Disp: , Rfl:    lamoTRIgine (LAMICTAL) 100 MG tablet, Take 2 tablets (200 mg total) by mouth 2 (two) times daily., Disp: 360 tablet, Rfl: 3   lamoTRIgine (LAMICTAL) 25 MG tablet, Take 2 tablets (50 mg total) by mouth 2 (two) times daily., Disp: 360 tablet, Rfl: 3   oxyCODONE (OXYCONTIN) 15  mg 12 hr tablet, Take 1 tablet (15 mg total) by mouth every 12 (twelve) hours., Disp: 34 tablet, Rfl: 0   Oxycodone HCl 10 MG TABS, Take 1 tablet (10 mg total) by mouth every 4 (four) hours as needed., Disp: 90 tablet, Rfl: 0   SUMAtriptan (IMITREX) 50 MG tablet, Take 50 mg by mouth every 2 (two) hours as needed for migraine. May repeat in 2 hours if headache persists or recurs., Disp: , Rfl:  No current facility-administered medications for this visit.  Facility-Administered Medications Ordered in Other Visits:    0.9 %  sodium chloride infusion, , Intravenous, Once, Katalea Ucci, Rose Phi, MD   heparin lock flush 100 unit/mL, 500 Units, Intracatheter, Once PRN, Boleslaw Borghi, Rose Phi, MD   nivolumab (OPDIVO) 480 mg in sodium chloride 0.9 % 100 mL chemo infusion, 480 mg, Intravenous, Once, Havanah Nelms, Rose Phi, MD   sodium chloride flush (NS) 0.9 % injection 10 mL, 10 mL, Intracatheter, PRN, Josph Macho, MD, 10 mL at 05/19/20 1343   sodium chloride flush (NS) 0.9 % injection 10 mL, 10 mL, Intravenous, PRN, Josph Macho, MD, 10 mL at 08/23/22 1610   sodium chloride flush (NS) 0.9 % injection 10 mL, 10 mL, Intracatheter, PRN, Josph Macho, MD  Allergies: No Known  Allergies  Past Medical History, Surgical history, Social history, and Family History were reviewed and updated.  Review of Systems: Review of Systems  Constitutional: Negative.   HENT:  Negative.    Eyes: Negative.   Respiratory: Negative.    Cardiovascular: Negative.   Gastrointestinal: Negative.   Endocrine: Negative.   Musculoskeletal:  Positive for back pain and flank pain.  Skin: Negative.   Neurological: Negative.   Hematological: Negative.   Psychiatric/Behavioral: Negative.      Physical Exam:  height is 5\' 7"  (1.702 m) and weight is 216 lb (98 kg). His oral temperature is 98.6 F (37 C). His blood pressure is 142/61 (abnormal) and his pulse is 86. His respiration is 18 and oxygen saturation is 97%.   Wt Readings  from Last 3 Encounters:  01/28/23 216 lb (98 kg)  12/17/22 216 lb (98 kg)  12/04/22 215 lb (97.5 kg)    Physical Exam Vitals reviewed.  HENT:     Head: Normocephalic and atraumatic.  Eyes:     Pupils: Pupils are equal, round, and reactive to light.  Cardiovascular:     Rate and Rhythm: Normal rate and regular rhythm.     Heart sounds: Normal heart sounds.  Pulmonary:     Effort: Pulmonary effort is normal.     Breath sounds: Normal breath sounds.  Abdominal:     General: Bowel sounds are normal.     Palpations: Abdomen is soft.  Musculoskeletal:        General: No tenderness or deformity. Normal range of motion.     Cervical back: Normal range of motion.  Lymphadenopathy:     Cervical: No cervical adenopathy.  Skin:    General: Skin is warm and dry.     Findings: No erythema or rash.  Neurological:     Mental Status: He is alert and oriented to person, place, and time.  Psychiatric:        Behavior: Behavior normal.        Thought Content: Thought content normal.        Judgment: Judgment normal.      Lab Results  Component Value Date   WBC 7.5 01/28/2023   HGB 14.4 01/28/2023   HCT 42.0 01/28/2023   MCV 91.9 01/28/2023   PLT 230 01/28/2023     Chemistry      Component Value Date/Time   NA 137 01/28/2023 0920   NA 137 12/04/2022 1132   K 4.1 01/28/2023 0920   CL 104 01/28/2023 0920   CO2 26 01/28/2023 0920   BUN 13 01/28/2023 0920   BUN 15 12/04/2022 1132   CREATININE 0.99 01/28/2023 0920   CREATININE 1.15 08/25/2019 1450      Component Value Date/Time   CALCIUM 9.2 01/28/2023 0920   ALKPHOS 106 01/28/2023 0920   AST 16 01/28/2023 0920   ALT 15 01/28/2023 0920   BILITOT 0.4 01/28/2023 0920      Impression and Plan: Vincent David is a very nice 57 year old white male.  He has metastatic renal cell carcinoma.  For some reason, his disease clearly favors his bones.  His lungs and liver and lymph nodes all appear to be clean.  He is doing great on  immunotherapy.  The PET scan confirms that he is still in remission with the immunotherapy.  Again I wish there is some that we do about the back.  This is not a surgical issue.  I wonder if he can go back to see the  orthopedist who put in the left hip for him.  I think that this was with Dr. Orvis Brill of Orthopedic Surgery.  This might be reasonable.    We will go ahead and get her back in 2 more months for treatment.    12/23/202411:18 AM

## 2023-01-28 NOTE — Patient Instructions (Signed)
 [facility] - A DEPT OF [facility]  Discharge Instructions: Thank you for choosing [facility] to provide your oncology and hematology care.   If you have a lab appointment with the Cancer Center, please go directly to the Cancer Center and check in at the registration area.  Wear comfortable clothing and clothing appropriate for easy access to any Portacath or PICC line.   We strive to give you quality time with your provider. You may need to reschedule your appointment if you arrive late (15 or more minutes).  Arriving late affects you and other patients whose appointments are after yours.  Also, if you miss three or more appointments without notifying the office, you may be dismissed from the clinic at the provider's discretion.      For prescription refill requests, have your pharmacy contact our office and allow 72 hours for refills to be completed.    Today you received the following chemotherapy and/or immunotherapy agents opdivo      To help prevent nausea and vomiting after your treatment, we encourage you to take your nausea medication as directed.  BELOW ARE SYMPTOMS THAT SHOULD BE REPORTED IMMEDIATELY: *FEVER GREATER THAN 100.4 F (38 C) OR HIGHER *CHILLS OR SWEATING *NAUSEA AND VOMITING THAT IS NOT CONTROLLED WITH YOUR NAUSEA MEDICATION *UNUSUAL SHORTNESS OF BREATH *UNUSUAL BRUISING OR BLEEDING *URINARY PROBLEMS (pain or burning when urinating, or frequent urination) *BOWEL PROBLEMS (unusual diarrhea, constipation, pain near the anus) TENDERNESS IN MOUTH AND THROAT WITH OR WITHOUT PRESENCE OF ULCERS (sore throat, sores in mouth, or a toothache) UNUSUAL RASH, SWELLING OR PAIN  UNUSUAL VAGINAL DISCHARGE OR ITCHING   Items with * indicate a potential emergency and should be followed up as soon as possible or go to the Emergency Department if any problems should occur.  Please show the CHEMOTHERAPY ALERT CARD or IMMUNOTHERAPY ALERT  CARD at check-in to the Emergency Department and triage nurse. Should you have questions after your visit or need to cancel or reschedule your appointment, please contact [facility] - A DEPT OF [facility]  [phone] and follow the prompts.  Office hours are 8:00 a.m. to 4:30 p.m. Monday - Friday. Please note that voicemails left after 4:00 p.m. may not be returned until the following business day.  We are closed weekends and major holidays. You have access to a nurse at all times for urgent questions. Please call the main number to the clinic [phone] and follow the prompts.  For any non-urgent questions, you may also contact your provider using MyChart. We now offer e-Visits for anyone 24 and older to request care online for non-urgent symptoms. For details visit [email or web address].   Also download the MyChart app! Go to the app store, search "MyChart", open the app, select [place], and log in with your MyChart username and password.

## 2023-01-28 NOTE — Patient Instructions (Signed)

## 2023-02-18 ENCOUNTER — Encounter: Payer: Self-pay | Admitting: Hematology & Oncology

## 2023-03-28 ENCOUNTER — Other Ambulatory Visit (HOSPITAL_BASED_OUTPATIENT_CLINIC_OR_DEPARTMENT_OTHER): Payer: Self-pay

## 2023-03-28 ENCOUNTER — Other Ambulatory Visit: Payer: Self-pay | Admitting: Hematology & Oncology

## 2023-03-28 ENCOUNTER — Other Ambulatory Visit: Payer: Self-pay

## 2023-03-28 MED ORDER — OXYCODONE HCL 10 MG PO TABS
10.0000 mg | ORAL_TABLET | ORAL | 0 refills | Status: DC | PRN
  Filled 2023-03-28: qty 90, 15d supply, fill #0

## 2023-03-28 MED ORDER — OXYCODONE HCL ER 15 MG PO T12A
15.0000 mg | EXTENDED_RELEASE_TABLET | Freq: Two times a day (BID) | ORAL | 0 refills | Status: DC
  Filled 2023-03-28: qty 34, 17d supply, fill #0

## 2023-04-01 ENCOUNTER — Encounter: Payer: Self-pay | Admitting: Hematology & Oncology

## 2023-04-01 ENCOUNTER — Inpatient Hospital Stay: Payer: 59 | Attending: Hematology & Oncology

## 2023-04-01 ENCOUNTER — Inpatient Hospital Stay: Payer: 59

## 2023-04-01 ENCOUNTER — Inpatient Hospital Stay (HOSPITAL_BASED_OUTPATIENT_CLINIC_OR_DEPARTMENT_OTHER): Payer: 59 | Admitting: Hematology & Oncology

## 2023-04-01 VITALS — BP 119/73 | HR 70 | Resp 20

## 2023-04-01 VITALS — BP 123/65 | HR 82 | Temp 98.2°F | Resp 18 | Ht 67.0 in | Wt 220.0 lb

## 2023-04-01 DIAGNOSIS — C642 Malignant neoplasm of left kidney, except renal pelvis: Secondary | ICD-10-CM

## 2023-04-01 DIAGNOSIS — Z79899 Other long term (current) drug therapy: Secondary | ICD-10-CM | POA: Diagnosis not present

## 2023-04-01 DIAGNOSIS — Z7982 Long term (current) use of aspirin: Secondary | ICD-10-CM | POA: Diagnosis not present

## 2023-04-01 DIAGNOSIS — F17210 Nicotine dependence, cigarettes, uncomplicated: Secondary | ICD-10-CM | POA: Insufficient documentation

## 2023-04-01 DIAGNOSIS — C641 Malignant neoplasm of right kidney, except renal pelvis: Secondary | ICD-10-CM | POA: Insufficient documentation

## 2023-04-01 DIAGNOSIS — C7951 Secondary malignant neoplasm of bone: Secondary | ICD-10-CM | POA: Insufficient documentation

## 2023-04-01 DIAGNOSIS — Z5112 Encounter for antineoplastic immunotherapy: Secondary | ICD-10-CM | POA: Insufficient documentation

## 2023-04-01 DIAGNOSIS — Z9189 Other specified personal risk factors, not elsewhere classified: Secondary | ICD-10-CM

## 2023-04-01 LAB — CBC WITH DIFFERENTIAL (CANCER CENTER ONLY)
Abs Immature Granulocytes: 0.06 10*3/uL (ref 0.00–0.07)
Basophils Absolute: 0.1 10*3/uL (ref 0.0–0.1)
Basophils Relative: 1 %
Eosinophils Absolute: 0.5 10*3/uL (ref 0.0–0.5)
Eosinophils Relative: 6 %
HCT: 44.2 % (ref 39.0–52.0)
Hemoglobin: 15 g/dL (ref 13.0–17.0)
Immature Granulocytes: 1 %
Lymphocytes Relative: 16 %
Lymphs Abs: 1.3 10*3/uL (ref 0.7–4.0)
MCH: 31.2 pg (ref 26.0–34.0)
MCHC: 33.9 g/dL (ref 30.0–36.0)
MCV: 91.9 fL (ref 80.0–100.0)
Monocytes Absolute: 0.5 10*3/uL (ref 0.1–1.0)
Monocytes Relative: 5 %
Neutro Abs: 6.1 10*3/uL (ref 1.7–7.7)
Neutrophils Relative %: 71 %
Platelet Count: 223 10*3/uL (ref 150–400)
RBC: 4.81 MIL/uL (ref 4.22–5.81)
RDW: 13.6 % (ref 11.5–15.5)
WBC Count: 8.6 10*3/uL (ref 4.0–10.5)
nRBC: 0 % (ref 0.0–0.2)

## 2023-04-01 LAB — CMP (CANCER CENTER ONLY)
ALT: 19 U/L (ref 0–44)
AST: 17 U/L (ref 15–41)
Albumin: 4.4 g/dL (ref 3.5–5.0)
Alkaline Phosphatase: 104 U/L (ref 38–126)
Anion gap: 8 (ref 5–15)
BUN: 20 mg/dL (ref 6–20)
CO2: 27 mmol/L (ref 22–32)
Calcium: 9.3 mg/dL (ref 8.9–10.3)
Chloride: 103 mmol/L (ref 98–111)
Creatinine: 1.02 mg/dL (ref 0.61–1.24)
GFR, Estimated: >60 mL/min (ref >60)
Glucose, Bld: 146 mg/dL — ABNORMAL HIGH (ref 70–99)
Potassium: 4.1 mmol/L (ref 3.5–5.1)
Sodium: 138 mmol/L (ref 135–145)
Total Bilirubin: 0.3 mg/dL (ref 0.0–1.2)
Total Protein: 6.5 g/dL (ref 6.5–8.1)

## 2023-04-01 LAB — TSH: TSH: 2.743 u[IU]/mL (ref 0.350–4.500)

## 2023-04-01 LAB — LACTATE DEHYDROGENASE: LDH: 149 U/L (ref 98–192)

## 2023-04-01 MED ORDER — AMLODIPINE BESYLATE 5 MG PO TABS: 5.0000 mg | ORAL_TABLET | Freq: Every day | ORAL | 5 refills | Status: AC

## 2023-04-01 MED ORDER — DENOSUMAB 120 MG/1.7ML ~~LOC~~ SOLN
120.0000 mg | Freq: Once | SUBCUTANEOUS | Status: AC
  Administered 2023-04-01: 120 mg via SUBCUTANEOUS
  Filled 2023-04-01: qty 1.7

## 2023-04-01 MED ORDER — SODIUM CHLORIDE 0.9% FLUSH
10.0000 mL | INTRAVENOUS | Status: DC | PRN
  Administered 2023-04-01: 10 mL

## 2023-04-01 MED ORDER — SODIUM CHLORIDE 0.9 % IV SOLN
480.0000 mg | Freq: Once | INTRAVENOUS | Status: AC
  Administered 2023-04-01: 480 mg via INTRAVENOUS
  Filled 2023-04-01: qty 48

## 2023-04-01 MED ORDER — SODIUM CHLORIDE 0.9 % IV SOLN: Freq: Once | INTRAVENOUS | Status: AC

## 2023-04-01 MED ORDER — HEPARIN SOD (PORK) LOCK FLUSH 100 UNIT/ML IV SOLN
500.0000 [IU] | Freq: Once | INTRAVENOUS | Status: AC | PRN
  Administered 2023-04-01: 500 [IU]

## 2023-04-01 NOTE — Progress Notes (Signed)
 Hematology and Oncology Follow Up Visit  Vincent David [id number] [birth year] 58 y.o. 04/01/2023   Principle Diagnosis:  Metastatic Renal Cell Carcinoma - bone mets  Current Therapy:   Nivoumab/Ipilimumab -- s/p cycle #5 -- started on 09/29/2019 -- maintenance Nivolumab q 2 month -- Started on 12/29/2019 Xgeva 120 mg sq q 2 months -- next dose in 03/2023  XRT to right iliac mass/ L5 vertebral body/ Left femoral neck LEFT hip repair Cryoablation of left kidney mass-10/19/2020     Interim History:  Vincent David is back for follow-up.  We last saw him back in December.  He is doing okay.  He really has had no complaints since we last saw him.  Thankfully, I think that either Orthopedic Surgery or Intervention Radiology has helped his back.  He had an injection.  The radicular pain in the left thigh is gone.  He is very happy with that.  He has had no problems with bowels or bladder.  He still smoking.  He says she smokes about 10 cigarettes a day.  He is trying to cut back.  He has had no leg swelling.  He has had no bleeding.  He has had no rashes.  He has had no headache.  His last PET scan was done back in December.  We will set him up with a another 1 before we see him back in April.  He is try to stay active.  He is trying to watch his weight and watch his blood sugars.  Overall, his performance status is ECOG 1.     Medications:  Current Outpatient Medications:    amLODipine (NORVASC) 5 MG tablet, TAKE 1 TABLET BY MOUTH EVERY DAY, Disp: 90 tablet, Rfl: 5   aspirin EC 81 MG tablet, Take 1 tablet (81 mg total) by mouth 2 (two) times daily., Disp: 60 tablet, Rfl: 0   cloBAZam (ONFI) 10 MG tablet, TAKE 1 TABLET BY MOUTH EVERYDAY AT BEDTIME, Disp: 30 tablet, Rfl: 5   ibuprofen (ADVIL) 200 MG tablet, Take 400 mg by mouth every 8 (eight) hours as needed (pain.)., Disp: , Rfl:    lamoTRIgine (LAMICTAL) 100 MG tablet, Take 2 tablets (200 mg total) by mouth 2 (two) times daily., Disp: 360  tablet, Rfl: 3   lamoTRIgine (LAMICTAL) 25 MG tablet, Take 2 tablets (50 mg total) by mouth 2 (two) times daily., Disp: 360 tablet, Rfl: 3   oxyCODONE (OXYCONTIN) 15 mg 12 hr tablet, Take 1 tablet (15 mg total) by mouth every 12 (twelve) hours., Disp: 34 tablet, Rfl: 0   Oxycodone HCl 10 MG TABS, Take 1 tablet (10 mg total) by mouth every 4 (four) hours as needed., Disp: 90 tablet, Rfl: 0   SUMAtriptan (IMITREX) 50 MG tablet, Take 50 mg by mouth every 2 (two) hours as needed for migraine. May repeat in 2 hours if headache persists or recurs. (Patient not taking: Reported on 04/01/2023), Disp: , Rfl:  No current facility-administered medications for this visit.  Facility-Administered Medications Ordered in Other Visits:    0.9 %  sodium chloride infusion, , Intravenous, Once, Vincent David, Vincent Phi, MD   denosumab (XGEVA) injection 120 mg, 120 mg, Subcutaneous, Once, Vincent David, Vincent Phi, MD   heparin lock flush 100 unit/mL, 500 Units, Intracatheter, Once PRN, Vincent David, Vincent Phi, MD   nivolumab (OPDIVO) 480 mg in sodium chloride 0.9 % 100 mL chemo infusion, 480 mg, Intravenous, Once, Vincent David, Vincent Phi, MD   sodium chloride flush (NS) 0.9 % injection 10 mL,  10 mL, Intracatheter, PRN, Vincent Macho, MD, 10 mL at 05/19/20 1343   sodium chloride flush (NS) 0.9 % injection 10 mL, 10 mL, Intravenous, PRN, Vincent Macho, MD, 10 mL at 08/23/22 1610   sodium chloride flush (NS) 0.9 % injection 10 mL, 10 mL, Intracatheter, PRN, Vincent David, Vincent Phi, MD  Allergies: No Known Allergies  Past Medical History, Surgical history, Social history, and Family History were reviewed and updated.  Review of Systems: Review of Systems  Constitutional: Negative.   HENT:  Negative.    Eyes: Negative.   Respiratory: Negative.    Cardiovascular: Negative.   Gastrointestinal: Negative.   Endocrine: Negative.   Musculoskeletal:  Positive for back pain and flank pain.  Skin: Negative.   Neurological: Negative.   Hematological:  Negative.   Psychiatric/Behavioral: Negative.      Physical Exam:  height is 5\' 7"  (1.702 m) and weight is 220 lb (99.8 kg). His oral temperature is 98.2 F (36.8 C). His blood pressure is 123/65 and his pulse is 82. His respiration is 18 and oxygen saturation is 96%.   Wt Readings from Last 3 Encounters:  04/01/23 220 lb (99.8 kg)  01/28/23 216 lb (98 kg)  12/17/22 216 lb (98 kg)    Physical Exam Vitals reviewed.  HENT:     Head: Normocephalic and atraumatic.  Eyes:     Pupils: Pupils are equal, round, and reactive to light.  Cardiovascular:     Rate and Rhythm: Normal rate and regular rhythm.     Heart sounds: Normal heart sounds.  Pulmonary:     Effort: Pulmonary effort is normal.     Breath sounds: Normal breath sounds.  Abdominal:     General: Bowel sounds are normal.     Palpations: Abdomen is soft.  Musculoskeletal:        General: No tenderness or deformity. Normal range of motion.     Cervical back: Normal range of motion.  Lymphadenopathy:     Cervical: No cervical adenopathy.  Skin:    General: Skin is warm and dry.     Findings: No erythema or rash.  Neurological:     Mental Status: He is alert and oriented to person, place, and time.  Psychiatric:        Behavior: Behavior normal.        Thought Content: Thought content normal.        Judgment: Judgment normal.      Lab Results  Component Value Date   WBC 8.6 04/01/2023   HGB 15.0 04/01/2023   HCT 44.2 04/01/2023   MCV 91.9 04/01/2023   PLT 223 04/01/2023     Chemistry      Component Value Date/Time   NA 138 04/01/2023 1020   NA 137 12/04/2022 1132   K 4.1 04/01/2023 1020   CL 103 04/01/2023 1020   CO2 27 04/01/2023 1020   BUN 20 04/01/2023 1020   BUN 15 12/04/2022 1132   CREATININE 1.02 04/01/2023 1020   CREATININE 1.15 08/25/2019 1450      Component Value Date/Time   CALCIUM 9.3 04/01/2023 1020   ALKPHOS 104 04/01/2023 1020   AST 17 04/01/2023 1020   ALT 19 04/01/2023 1020    BILITOT 0.3 04/01/2023 1020      Impression and Plan: Vincent David is a very nice 58 year old white male.  He has metastatic renal cell carcinoma.  For some reason, his disease clearly favors his bones.  His lungs and liver and  lymph nodes all appear to be clean.  Will go ahead with another PET scan in April.  I think this would be reasonable.  I am happy that his back is doing better and that the he does not have a radicular issue with the left side.  I will go ahead and plan to see him back after his PET scan.  He may have to readjust his appointment but his son is getting married in April.   2/24/202511:34 AM

## 2023-04-02 LAB — T4: T4, Total: 5.9 ug/dL (ref 4.5–12.0)

## 2023-04-08 ENCOUNTER — Ambulatory Visit: Payer: BC Managed Care – PPO | Admitting: Hematology & Oncology

## 2023-04-08 ENCOUNTER — Other Ambulatory Visit: Payer: BC Managed Care – PPO

## 2023-04-08 ENCOUNTER — Ambulatory Visit: Payer: BC Managed Care – PPO

## 2023-04-08 ENCOUNTER — Encounter: Payer: Self-pay | Admitting: Hematology & Oncology

## 2023-04-08 ENCOUNTER — Inpatient Hospital Stay: Payer: 59

## 2023-04-09 ENCOUNTER — Telehealth: Payer: Self-pay

## 2023-04-09 NOTE — Telephone Encounter (Signed)
 Notified the pt and left on VM that his disability form were completed and faxed over. If he had any questions feel to  free to call the [facility].

## 2023-04-10 ENCOUNTER — Other Ambulatory Visit: Payer: Self-pay | Admitting: Neurology

## 2023-05-16 ENCOUNTER — Encounter (HOSPITAL_COMMUNITY)
Admission: RE | Admit: 2023-05-16 | Discharge: 2023-05-16 | Disposition: A | Discharge status: Home / Self Care | Payer: 59 | Source: Ambulatory Visit | Attending: Hematology & Oncology | Admitting: Hematology & Oncology

## 2023-05-16 DIAGNOSIS — C642 Malignant neoplasm of left kidney, except renal pelvis: Secondary | ICD-10-CM | POA: Insufficient documentation

## 2023-05-16 LAB — GLUCOSE, CAPILLARY: Glucose-Capillary: 126 mg/dL — ABNORMAL HIGH (ref 70–99)

## 2023-05-16 MED ORDER — FLUDEOXYGLUCOSE F - 18 (FDG) INJECTION
10.5700 | Freq: Once | INTRAVENOUS | Status: AC
  Administered 2023-05-16: 10.57 via INTRAVENOUS

## 2023-05-22 ENCOUNTER — Other Ambulatory Visit: Payer: Self-pay | Admitting: Hematology & Oncology

## 2023-05-23 ENCOUNTER — Other Ambulatory Visit (HOSPITAL_BASED_OUTPATIENT_CLINIC_OR_DEPARTMENT_OTHER): Payer: Self-pay

## 2023-05-23 ENCOUNTER — Encounter: Payer: Self-pay | Admitting: *Deleted

## 2023-05-23 ENCOUNTER — Other Ambulatory Visit: Payer: Self-pay

## 2023-05-23 MED ORDER — OXYCODONE HCL 10 MG PO TABS
10.0000 mg | ORAL_TABLET | ORAL | 0 refills | Status: DC | PRN
  Filled 2023-05-23: qty 90, 15d supply, fill #0

## 2023-05-23 MED ORDER — OXYCODONE HCL ER 15 MG PO T12A
15.0000 mg | EXTENDED_RELEASE_TABLET | Freq: Two times a day (BID) | ORAL | 0 refills | Status: DC
  Filled 2023-05-23: qty 34, 17d supply, fill #0

## 2023-05-24 ENCOUNTER — Telehealth: Payer: Self-pay

## 2023-05-24 NOTE — Telephone Encounter (Signed)
 Clinical Social Work attempted to contact patient by phone to assess needs.  Left voicemail with contact information and request for return call.

## 2023-05-27 ENCOUNTER — Inpatient Hospital Stay (HOSPITAL_BASED_OUTPATIENT_CLINIC_OR_DEPARTMENT_OTHER): Payer: 59 | Admitting: Hematology & Oncology

## 2023-05-27 ENCOUNTER — Inpatient Hospital Stay: Payer: 59

## 2023-05-27 ENCOUNTER — Other Ambulatory Visit: Payer: Self-pay

## 2023-05-27 ENCOUNTER — Encounter: Payer: Self-pay | Admitting: Hematology & Oncology

## 2023-05-27 ENCOUNTER — Inpatient Hospital Stay: Payer: 59 | Attending: Hematology & Oncology

## 2023-05-27 VITALS — BP 132/75 | HR 65 | Resp 18

## 2023-05-27 VITALS — BP 123/59 | HR 71 | Temp 98.2°F | Resp 18 | Ht 67.0 in | Wt 212.0 lb

## 2023-05-27 DIAGNOSIS — Z5112 Encounter for antineoplastic immunotherapy: Secondary | ICD-10-CM | POA: Diagnosis present

## 2023-05-27 DIAGNOSIS — Z79899 Other long term (current) drug therapy: Secondary | ICD-10-CM | POA: Insufficient documentation

## 2023-05-27 DIAGNOSIS — C642 Malignant neoplasm of left kidney, except renal pelvis: Secondary | ICD-10-CM

## 2023-05-27 DIAGNOSIS — C7951 Secondary malignant neoplasm of bone: Secondary | ICD-10-CM | POA: Insufficient documentation

## 2023-05-27 DIAGNOSIS — Z923 Personal history of irradiation: Secondary | ICD-10-CM | POA: Insufficient documentation

## 2023-05-27 DIAGNOSIS — Z7982 Long term (current) use of aspirin: Secondary | ICD-10-CM | POA: Insufficient documentation

## 2023-05-27 LAB — CBC WITH DIFFERENTIAL (CANCER CENTER ONLY)
Abs Immature Granulocytes: 0.03 10*3/uL (ref 0.00–0.07)
Basophils Absolute: 0.1 10*3/uL (ref 0.0–0.1)
Basophils Relative: 1 %
Eosinophils Absolute: 0.4 10*3/uL (ref 0.0–0.5)
Eosinophils Relative: 5 %
HCT: 45.2 % (ref 39.0–52.0)
Hemoglobin: 15.2 g/dL (ref 13.0–17.0)
Immature Granulocytes: 0 %
Lymphocytes Relative: 17 %
Lymphs Abs: 1.2 10*3/uL (ref 0.7–4.0)
MCH: 31.3 pg (ref 26.0–34.0)
MCHC: 33.6 g/dL (ref 30.0–36.0)
MCV: 93 fL (ref 80.0–100.0)
Monocytes Absolute: 0.3 10*3/uL (ref 0.1–1.0)
Monocytes Relative: 4 %
Neutro Abs: 5.3 10*3/uL (ref 1.7–7.7)
Neutrophils Relative %: 73 %
Platelet Count: 225 10*3/uL (ref 150–400)
RBC: 4.86 MIL/uL (ref 4.22–5.81)
RDW: 13.6 % (ref 11.5–15.5)
WBC Count: 7.3 10*3/uL (ref 4.0–10.5)
nRBC: 0 % (ref 0.0–0.2)

## 2023-05-27 LAB — CMP (CANCER CENTER ONLY)
ALT: 13 U/L (ref 0–44)
AST: 14 U/L — ABNORMAL LOW (ref 15–41)
Albumin: 4.3 g/dL (ref 3.5–5.0)
Alkaline Phosphatase: 90 U/L (ref 38–126)
Anion gap: 11 (ref 5–15)
BUN: 16 mg/dL (ref 6–20)
CO2: 27 mmol/L (ref 22–32)
Calcium: 8.9 mg/dL (ref 8.9–10.3)
Chloride: 103 mmol/L (ref 98–111)
Creatinine: 1.11 mg/dL (ref 0.61–1.24)
GFR, Estimated: >60 mL/min (ref >60)
Glucose, Bld: 154 mg/dL — ABNORMAL HIGH (ref 70–99)
Potassium: 4.7 mmol/L (ref 3.5–5.1)
Sodium: 141 mmol/L (ref 135–145)
Total Bilirubin: 0.3 mg/dL (ref 0.0–1.2)
Total Protein: 6.3 g/dL — ABNORMAL LOW (ref 6.5–8.1)

## 2023-05-27 LAB — TSH: TSH: 2.017 u[IU]/mL (ref 0.350–4.500)

## 2023-05-27 LAB — LACTATE DEHYDROGENASE: LDH: 143 U/L (ref 98–192)

## 2023-05-27 MED ORDER — HEPARIN SOD (PORK) LOCK FLUSH 100 UNIT/ML IV SOLN
500.0000 [IU] | Freq: Once | INTRAVENOUS | Status: AC | PRN
Start: 2023-05-27 — End: 2023-05-27
  Administered 2023-05-27: 500 [IU]

## 2023-05-27 MED ORDER — ALTEPLASE 2 MG IJ SOLR
2.0000 mg | Freq: Once | INTRAMUSCULAR | Status: AC | PRN
  Administered 2023-05-27: 2 mg
  Filled 2023-05-27: qty 2

## 2023-05-27 MED ORDER — SODIUM CHLORIDE 0.9 % IV SOLN
Freq: Once | INTRAVENOUS | Status: AC
Start: 2023-05-27 — End: 2023-05-27

## 2023-05-27 MED ORDER — SODIUM CHLORIDE 0.9 % IV SOLN
480.0000 mg | Freq: Once | INTRAVENOUS | Status: AC
  Administered 2023-05-27: 480 mg via INTRAVENOUS
  Filled 2023-05-27: qty 48

## 2023-05-27 MED ORDER — SODIUM CHLORIDE 0.9% FLUSH
10.0000 mL | INTRAVENOUS | Status: DC | PRN
  Administered 2023-05-27: 10 mL

## 2023-05-27 NOTE — Progress Notes (Signed)
 At 0925, bright brisk blood return, site flush well.

## 2023-05-27 NOTE — Progress Notes (Signed)
 Pt's port-a-cath accessed, no blood return noted. Site flushes well with no swelling or pain noted. Cath-flo instilled per policy. Site marked.

## 2023-05-27 NOTE — Progress Notes (Signed)
 Hematology and Oncology Follow Up Visit  Vincent David [id number] January 07, 1966 58 y.o. 05/27/2023   Principle Diagnosis:  Metastatic Renal Cell Carcinoma - bone mets  Current Therapy:   Nivoumab/Ipilimumab  -- s/p cycle #5 -- started on 09/29/2019 -- maintenance Nivolumab  q 2 month -- Started on 12/29/2019 Xgeva  120 mg sq q 2 months -- next dose in 07/2023  XRT to right iliac mass/ L5 vertebral body/ Left femoral neck LEFT hip repair Cryoablation of left kidney mass-10/19/2020     Interim History:  Vincent David is back for follow-up.  Unfortunately, his Port-A-Cath is having a little bit of trouble today.  Hopefully, we will be able to open it up.  He had a PET scan that was done on 05/16/2023.  This did not show any evidence of active kidney cancer.  There was a little bit of activity in the prostate.  Will have to watch this.  He says one of his sons is get married on Saturday.  This will be in [place] .  I am sure that he and his wife will have a wonderful time down there at the wedding.  He has had no fever.  He has had no bleeding.  Has had no obvious change in bowel or bladder habits.  Has had no leg swelling.  He has had no rashes.  He has had no headache.  He has had no cough or shortness of breath.  The pollen does bother him a little bit.  Overall, I think his performance status about ECOG 1.  Medications:  Current Outpatient Medications:    amLODipine  (NORVASC ) 5 MG tablet, Take 1 tablet (5 mg total) by mouth daily., Disp: 90 tablet, Rfl: 5   aspirin  EC 81 MG tablet, Take 1 tablet (81 mg total) by mouth 2 (two) times daily., Disp: 60 tablet, Rfl: 0   cloBAZam  (ONFI ) 10 MG tablet, TAKE 1 TABLET BY MOUTH EVERYDAY AT BEDTIME, Disp: 30 tablet, Rfl: 5   ibuprofen  (ADVIL ) 200 MG tablet, Take 400 mg by mouth every 8 (eight) hours as needed (pain.)., Disp: , Rfl:    lamoTRIgine  (LAMICTAL ) 100 MG tablet, Take 2 tablets (200 mg total) by mouth 2 (two) times daily., Disp: 360  tablet, Rfl: 3   lamoTRIgine  (LAMICTAL ) 25 MG tablet, Take 2 tablets (50 mg total) by mouth 2 (two) times daily., Disp: 360 tablet, Rfl: 3   oxyCODONE  (OXYCONTIN ) 15 mg 12 hr tablet, Take 1 tablet (15 mg total) by mouth every 12 (twelve) hours., Disp: 34 tablet, Rfl: 0   Oxycodone  HCl 10 MG TABS, Take 1 tablet (10 mg total) by mouth every 4 (four) hours as needed., Disp: 90 tablet, Rfl: 0   SUMAtriptan  (IMITREX ) 50 MG tablet, Take 50 mg by mouth every 2 (two) hours as needed for migraine. May repeat in 2 hours if headache persists or recurs. (Patient not taking: Reported on 04/01/2023), Disp: , Rfl:  No current facility-administered medications for this visit.  Facility-Administered Medications Ordered in Other Visits:    sodium chloride  flush (NS) 0.9 % injection 10 mL, 10 mL, Intracatheter, PRN, Taneeka Curtner R, MD, 10 mL at 05/19/20 1343   sodium chloride  flush (NS) 0.9 % injection 10 mL, 10 mL, Intravenous, PRN, Jean Alejos R, MD, 10 mL at 08/23/22 1610  Allergies: No Known Allergies  Past Medical History, Surgical history, Social history, and Family History were reviewed and updated.  Review of Systems: Review of Systems  Constitutional: Negative.   HENT:  Negative.  Eyes: Negative.   Respiratory: Negative.    Cardiovascular: Negative.   Gastrointestinal: Negative.   Endocrine: Negative.   Musculoskeletal:  Positive for back pain and flank pain.  Skin: Negative.   Neurological: Negative.   Hematological: Negative.   Psychiatric/Behavioral: Negative.      Physical Exam:  height is 5\' 7"  (1.702 m) and weight is 212 lb (96.2 kg). His oral temperature is 98.2 F (36.8 C). His blood pressure is 123/59 (abnormal) and his pulse is 71. His respiration is 18 and oxygen  saturation is 99%.   Wt Readings from Last 3 Encounters:  05/27/23 212 lb (96.2 kg)  05/16/23 212 lb (96.2 kg)  04/01/23 220 lb (99.8 kg)    Physical Exam Vitals reviewed.  HENT:     Head: Normocephalic and  atraumatic.  Eyes:     Pupils: Pupils are equal, round, and reactive to light.  Cardiovascular:     Rate and Rhythm: Normal rate and regular rhythm.     Heart sounds: Normal heart sounds.  Pulmonary:     Effort: Pulmonary effort is normal.     Breath sounds: Normal breath sounds.  Abdominal:     General: Bowel sounds are normal.     Palpations: Abdomen is soft.  Musculoskeletal:        General: No tenderness or deformity. Normal range of motion.     Cervical back: Normal range of motion.  Lymphadenopathy:     Cervical: No cervical adenopathy.  Skin:    General: Skin is warm and dry.     Findings: No erythema or rash.  Neurological:     Mental Status: He is alert and oriented to person, place, and time.  Psychiatric:        Behavior: Behavior normal.        Thought Content: Thought content normal.        Judgment: Judgment normal.      Lab Results  Component Value Date   WBC 8.6 04/01/2023   HGB 15.0 04/01/2023   HCT 44.2 04/01/2023   MCV 91.9 04/01/2023   PLT 223 04/01/2023     Chemistry      Component Value Date/Time   NA 138 04/01/2023 1020   NA 137 12/04/2022 1132   K 4.1 04/01/2023 1020   CL 103 04/01/2023 1020   CO2 27 04/01/2023 1020   BUN 20 04/01/2023 1020   BUN 15 12/04/2022 1132   CREATININE 1.02 04/01/2023 1020   CREATININE 1.15 08/25/2019 1450      Component Value Date/Time   CALCIUM 9.3 04/01/2023 1020   ALKPHOS 104 04/01/2023 1020   AST 17 04/01/2023 1020   ALT 19 04/01/2023 1020   BILITOT 0.3 04/01/2023 1020      Impression and Plan: Vincent David is a very nice 58 year old white male.  He has metastatic renal cell carcinoma.  For some reason, his disease clearly favors his bones.  His lungs and liver and lymph nodes all appear to be clean.  I am glad that the PET scan looks great.  Will have to follow-up with the prostate.  I cannot imagine that this is can be a problem.  He gets the nivolumab  every 2 months.  I am happy that we can do it  at this interval.  For right now, we will go ahead and plan to get him back in 2 months.  He does not need another PET scan probably for another 4 months or so.   4/21/20258:19 AM

## 2023-05-27 NOTE — Patient Instructions (Signed)
 [facility] - A DEPT OF [facility]  Discharge Instructions: Thank you for choosing [facility] to provide your oncology and hematology care.   If you have a lab appointment with the Cancer Center, please go directly to the Cancer Center and check in at the registration area.  Wear comfortable clothing and clothing appropriate for easy access to any Portacath or PICC line.   We strive to give you quality time with your provider. You may need to reschedule your appointment if you arrive late (15 or more minutes).  Arriving late affects you and other patients whose appointments are after yours.  Also, if you miss three or more appointments without notifying the office, you may be dismissed from the clinic at the provider's discretion.      For prescription refill requests, have your pharmacy contact our office and allow 72 hours for refills to be completed.    Today you received the following chemotherapy and/or immunotherapy agents Opdivo       To help prevent nausea and vomiting after your treatment, we encourage you to take your nausea medication as directed.  BELOW ARE SYMPTOMS THAT SHOULD BE REPORTED IMMEDIATELY: *FEVER GREATER THAN 100.4 F (38 C) OR HIGHER *CHILLS OR SWEATING *NAUSEA AND VOMITING THAT IS NOT CONTROLLED WITH YOUR NAUSEA MEDICATION *UNUSUAL SHORTNESS OF BREATH *UNUSUAL BRUISING OR BLEEDING *URINARY PROBLEMS (pain or burning when urinating, or frequent urination) *BOWEL PROBLEMS (unusual diarrhea, constipation, pain near the anus) TENDERNESS IN MOUTH AND THROAT WITH OR WITHOUT PRESENCE OF ULCERS (sore throat, sores in mouth, or a toothache) UNUSUAL RASH, SWELLING OR PAIN  UNUSUAL VAGINAL DISCHARGE OR ITCHING   Items with * indicate a potential emergency and should be followed up as soon as possible or go to the Emergency Department if any problems should occur.  Please show the CHEMOTHERAPY ALERT CARD or IMMUNOTHERAPY  ALERT CARD at check-in to the Emergency Department and triage nurse. Should you have questions after your visit or need to cancel or reschedule your appointment, please contact [facility] - A DEPT OF [facility]  [phone] and follow the prompts.  Office hours are 8:00 a.m. to 4:30 p.m. Monday - Friday. Please note that voicemails left after 4:00 p.m. may not be returned until the following business day.  We are closed weekends and major holidays. You have access to a nurse at all times for urgent questions. Please call the main number to the clinic [phone] and follow the prompts.  For any non-urgent questions, you may also contact your provider using MyChart. We now offer e-Visits for anyone 77 and older to request care online for non-urgent symptoms. For details visit [email or web address].   Also download the MyChart app! Go to the app store, search "MyChart", open the app, select [facility], and log in with your MyChart username and password.

## 2023-05-27 NOTE — Patient Instructions (Signed)

## 2023-05-28 LAB — T4: T4, Total: 5.4 ug/dL (ref 4.5–12.0)

## 2023-07-02 ENCOUNTER — Telehealth: Payer: Self-pay | Admitting: *Deleted

## 2023-07-02 ENCOUNTER — Ambulatory Visit (INDEPENDENT_AMBULATORY_CARE_PROVIDER_SITE_OTHER): Payer: 59 | Admitting: Physician Assistant

## 2023-07-02 ENCOUNTER — Encounter: Payer: Self-pay | Admitting: Physician Assistant

## 2023-07-02 VITALS — BP 143/67 | HR 74 | Temp 98.3°F | Ht 67.0 in | Wt 213.2 lb

## 2023-07-02 DIAGNOSIS — J0100 Acute maxillary sinusitis, unspecified: Secondary | ICD-10-CM | POA: Diagnosis not present

## 2023-07-02 MED ORDER — AZELASTINE HCL 0.1 % NA SOLN: 1.0000 | Freq: Every evening | NASAL | 0 refills | Status: DC

## 2023-07-02 MED ORDER — PREDNISONE 20 MG PO TABS: 20.0000 mg | ORAL_TABLET | Freq: Every day | ORAL | 0 refills | Status: DC

## 2023-07-02 MED ORDER — FLUTICASONE PROPIONATE 50 MCG/ACT NA SUSP: 2.0000 | Freq: Every day | NASAL | 0 refills | Status: DC

## 2023-07-02 NOTE — Telephone Encounter (Signed)
 Contact Type Call Who Is Calling Patient / Member / Family / Caregiver Call Type Triage / Clinical Relationship To Patient Self Return Phone Number [phone] (Primary) Chief Complaint Facial Swelling Reason for Call Symptomatic / Request for Health Information Initial Comment Caller states he has pressure in his ear and gland swelling. He hears a clicking in his ear when he smiles. Translation No Nurse Assessment Nurse: Virgia Griffins, RN, Nettie Barb Date/Time [facility] Time): 07/02/2023 8:20:28 AM Confirm and document reason for call. If symptomatic, describe symptoms. ---Caller states that left ear has pressure and gland swells up with more usage of jaw. Clicking sound with swallowing. Last cat scan had some comment of sinuses, done in middle of April. Denies fever. Took Sudafed, c/o slight pressure not pain in left ear. Does the patient have any new or worsening symptoms? ---Yes Will a triage be completed? ---Yes Related visit to physician within the last 2 weeks? ---No Does the PT have any chronic conditions? (i.e. diabetes, asthma, this includes High risk factors for pregnancy, etc.) ---Yes List chronic conditions. ---Last treatment April 16th, immunotherapy, Kidney Ca. Is this a behavioral health or substance abuse call? ---No Guidelines Guideline Title Affirmed Question Affirmed Notes Nurse Date/Time (Eastern Time) Earache Diabetes mellitus or weak immune system (e.g., HIV positive, cancer chemo, splenectomy, Alise Appl 07/02/2023 8:24:31 AM PLEASE NOTE: All timestamps contained within this report are represented as [place] Standard Time. CONFIDENTIALTY NOTICE: This fax transmission is intended only for the addressee. It contains information that is legally privileged, confidential or otherwise protected from use or disclosure. If you are not the intended recipient, you are strictly prohibited from reviewing, disclosing, copying using or disseminating any of this  information or taking any action in reliance on or regarding this information. If you have received this fax in error, please notify us  immediately by telephone so that we can arrange for its return to us . Phone: [phone], Toll-Free: [phone], Fax: [phone] [email or web address] 1965/08/22 Page: 2 of 2 CallId: [id number] Guidelines Guideline Title Affirmed Question Affirmed Notes Nurse Date/Time [facility] Time) organ transplant, chronic steroids) Disp. Time [facility] Time) Disposition Final User 07/02/2023 8:27:23 AM See HCP within 4 Hours (or PCP triage) Yes Virgia Griffins, RN, Nettie Barb Final Disposition 07/02/2023 8:27:23 AM See HCP within 4 Hours (or PCP triage) Yes Virgia Griffins, RN, Nettie Barb Caller Disagree/Comply Comply Caller Understands Yes PreDisposition Did not know what to do Care Advice Given Per Guideline * IF OFFICE WILL BE OPEN: You need to be seen within the next 3 or 4 hours. Call your doctor (or NP/PA) now or as soon as the office opens. PAIN MEDICINES: * For pain relief, you can take either acetaminophen , ibuprofen , or naproxen. * They are overthe-counter (OTC) pain drugs. You can buy them at the drugstore. CALL BACK IF: * You become worse CARE ADVICE given per Earache (Adult) guideline. Referrals REFERRED TO PCP OFFICE

## 2023-07-02 NOTE — Progress Notes (Signed)
 Established patient visit   Patient: Vincent David   DOB: [birth year]   58 y.o. Male  MRN: [id number] Visit Date: 07/02/2023  Today's healthcare provider: Trenton Frock, PA-C   Cc. Ear pain, pressure, facial swelling  Subjective     Pt reports R ear pressure, gland swelling and a clicking in his ear when he smiles/ swallows x 1 week. Used warm compresses.   Taking sudafed OTC. PET scan 05/16/23 showed acute on chronic sinusitis.    Medications: Outpatient Medications Prior to Visit  Medication Sig   amLODipine  (NORVASC ) 5 MG tablet Take 1 tablet (5 mg total) by mouth daily.   aspirin  EC 81 MG tablet Take 1 tablet (81 mg total) by mouth 2 (two) times daily.   cloBAZam  (ONFI ) 10 MG tablet TAKE 1 TABLET BY MOUTH EVERYDAY AT BEDTIME   ibuprofen  (ADVIL ) 200 MG tablet Take 400 mg by mouth every 8 (eight) hours as needed (pain.).   lamoTRIgine  (LAMICTAL ) 100 MG tablet Take 2 tablets (200 mg total) by mouth 2 (two) times daily.   lamoTRIgine  (LAMICTAL ) 25 MG tablet Take 2 tablets (50 mg total) by mouth 2 (two) times daily.   oxyCODONE  (OXYCONTIN ) 15 mg 12 hr tablet Take 1 tablet (15 mg total) by mouth every 12 (twelve) hours.   Oxycodone  HCl 10 MG TABS Take 1 tablet (10 mg total) by mouth every 4 (four) hours as needed.   SUMAtriptan  (IMITREX ) 50 MG tablet Take 50 mg by mouth every 2 (two) hours as needed for migraine. May repeat in 2 hours if headache persists or recurs. (Patient not taking: Reported on 07/02/2023)   Facility-Administered Medications Prior to Visit  Medication Dose Route Frequency Provider   sodium chloride  flush (NS) 0.9 % injection 10 mL  10 mL Intracatheter PRN Ivor Mars, MD   sodium chloride  flush (NS) 0.9 % injection 10 mL  10 mL Intravenous PRN Ivor Mars, MD    Review of Systems  Constitutional:  Negative for fatigue and fever.  HENT:  Positive for congestion, ear pain and hearing loss.   Respiratory:  Negative for cough and shortness of  breath.   Cardiovascular:  Negative for chest pain, palpitations and leg swelling.  Neurological:  Negative for dizziness and headaches.       Objective    BP (!) 143/67   Pulse 74   Temp 98.3 F (36.8 C)   Ht 5\' 7"  (1.702 m)   Wt 213 lb 3.2 oz (96.7 kg)   BMI 33.39 kg/m    Physical Exam Constitutional:      General: He is awake.     Appearance: He is well-developed.  HENT:     Head: Normocephalic.     Ears:     Comments: Serous effusion and bulging behind b/l TM Eyes:     Conjunctiva/sclera: Conjunctivae normal.  Neck:     Comments: B/l tonsillar nodes enlarged, but soft, mobile Cardiovascular:     Rate and Rhythm: Normal rate.  Pulmonary:     Effort: Pulmonary effort is normal.  Skin:    General: Skin is warm.  Neurological:     Mental Status: He is alert and oriented to person, place, and time.  Psychiatric:        Attention and Perception: Attention normal.        Mood and Affect: Mood normal.        Speech: Speech normal.        Behavior: Behavior is  cooperative.     No results found for any visits on 07/02/23.  Assessment & Plan    Acute non-recurrent maxillary sinusitis Recommending claritin otc  Rx flonase, azelastine, prednisone  x 5 days If symptoms persist or return would trial abx given PET scan but symptoms do not appear bacterial.  -     Fluticasone Propionate; Place 2 sprays into both nostrils at bedtime.  Dispense: 16 g; Refill: 0 -     Azelastine HCl; Place 1 spray into both nostrils every evening. Use in each nostril as directed  Dispense: 30 mL; Refill: 0 -     predniSONE ; Take 1 tablet (20 mg total) by mouth daily with breakfast.  Dispense: 5 tablet; Refill: 0    Return if symptoms worsen or fail to improve.       Trenton Frock, PA-C  [facility] at [facility] [phone] (phone) [phone] (fax)  [facility]

## 2023-07-02 NOTE — Telephone Encounter (Signed)
 Pt scheduled today 1pm with Drubel.

## 2023-07-05 ENCOUNTER — Ambulatory Visit: Payer: 59 | Admitting: Medical

## 2023-07-22 ENCOUNTER — Inpatient Hospital Stay (HOSPITAL_BASED_OUTPATIENT_CLINIC_OR_DEPARTMENT_OTHER): Payer: 59 | Admitting: Hematology & Oncology

## 2023-07-22 ENCOUNTER — Encounter: Payer: Self-pay | Admitting: Hematology & Oncology

## 2023-07-22 ENCOUNTER — Other Ambulatory Visit: Payer: Self-pay

## 2023-07-22 ENCOUNTER — Inpatient Hospital Stay: Payer: 59

## 2023-07-22 ENCOUNTER — Inpatient Hospital Stay: Payer: 59 | Attending: Hematology & Oncology

## 2023-07-22 ENCOUNTER — Other Ambulatory Visit (HOSPITAL_BASED_OUTPATIENT_CLINIC_OR_DEPARTMENT_OTHER): Payer: Self-pay

## 2023-07-22 ENCOUNTER — Other Ambulatory Visit: Payer: Self-pay | Admitting: Hematology & Oncology

## 2023-07-22 VITALS — BP 135/71 | HR 76 | Temp 98.2°F | Resp 18 | Ht 67.0 in | Wt 214.0 lb

## 2023-07-22 DIAGNOSIS — C7951 Secondary malignant neoplasm of bone: Secondary | ICD-10-CM | POA: Diagnosis not present

## 2023-07-22 DIAGNOSIS — Z923 Personal history of irradiation: Secondary | ICD-10-CM | POA: Insufficient documentation

## 2023-07-22 DIAGNOSIS — F17200 Nicotine dependence, unspecified, uncomplicated: Secondary | ICD-10-CM | POA: Insufficient documentation

## 2023-07-22 DIAGNOSIS — C642 Malignant neoplasm of left kidney, except renal pelvis: Secondary | ICD-10-CM

## 2023-07-22 DIAGNOSIS — Z5112 Encounter for antineoplastic immunotherapy: Secondary | ICD-10-CM | POA: Insufficient documentation

## 2023-07-22 DIAGNOSIS — Z9189 Other specified personal risk factors, not elsewhere classified: Secondary | ICD-10-CM

## 2023-07-22 DIAGNOSIS — Z79899 Other long term (current) drug therapy: Secondary | ICD-10-CM | POA: Diagnosis not present

## 2023-07-22 DIAGNOSIS — Z7982 Long term (current) use of aspirin: Secondary | ICD-10-CM | POA: Diagnosis not present

## 2023-07-22 LAB — TSH: TSH: 4.32 u[IU]/mL (ref 0.350–4.500)

## 2023-07-22 LAB — CMP (CANCER CENTER ONLY)
ALT: 15 U/L (ref 0–44)
AST: 13 U/L — ABNORMAL LOW (ref 15–41)
Albumin: 4.4 g/dL (ref 3.5–5.0)
Alkaline Phosphatase: 108 U/L (ref 38–126)
Anion gap: 9 (ref 5–15)
BUN: 19 mg/dL (ref 6–20)
CO2: 26 mmol/L (ref 22–32)
Calcium: 9.3 mg/dL (ref 8.9–10.3)
Chloride: 104 mmol/L (ref 98–111)
Creatinine: 1.13 mg/dL (ref 0.61–1.24)
GFR, Estimated: >60 mL/min (ref >60)
Glucose, Bld: 112 mg/dL — ABNORMAL HIGH (ref 70–99)
Potassium: 4.1 mmol/L (ref 3.5–5.1)
Sodium: 139 mmol/L (ref 135–145)
Total Bilirubin: 0.4 mg/dL (ref 0.0–1.2)
Total Protein: 6.8 g/dL (ref 6.5–8.1)

## 2023-07-22 LAB — CBC WITH DIFFERENTIAL (CANCER CENTER ONLY)
Abs Immature Granulocytes: 0.04 10*3/uL (ref 0.00–0.07)
Basophils Absolute: 0 10*3/uL (ref 0.0–0.1)
Basophils Relative: 0 %
Eosinophils Absolute: 0.4 10*3/uL (ref 0.0–0.5)
Eosinophils Relative: 6 %
HCT: 44.1 % (ref 39.0–52.0)
Hemoglobin: 14.8 g/dL (ref 13.0–17.0)
Immature Granulocytes: 1 %
Lymphocytes Relative: 19 %
Lymphs Abs: 1.5 10*3/uL (ref 0.7–4.0)
MCH: 30.7 pg (ref 26.0–34.0)
MCHC: 33.6 g/dL (ref 30.0–36.0)
MCV: 91.5 fL (ref 80.0–100.0)
Monocytes Absolute: 0.4 10*3/uL (ref 0.1–1.0)
Monocytes Relative: 5 %
Neutro Abs: 5.3 10*3/uL (ref 1.7–7.7)
Neutrophils Relative %: 69 %
Platelet Count: 212 10*3/uL (ref 150–400)
RBC: 4.82 MIL/uL (ref 4.22–5.81)
RDW: 13.8 % (ref 11.5–15.5)
WBC Count: 7.6 10*3/uL (ref 4.0–10.5)
nRBC: 0 % (ref 0.0–0.2)

## 2023-07-22 LAB — LACTATE DEHYDROGENASE: LDH: 140 U/L (ref 98–192)

## 2023-07-22 MED ORDER — SODIUM CHLORIDE 0.9 % IV SOLN
480.0000 mg | Freq: Once | INTRAVENOUS | Status: AC
  Filled 2023-07-22: qty 48

## 2023-07-22 MED ORDER — DENOSUMAB 120 MG/1.7ML ~~LOC~~ SOLN
120.0000 mg | Freq: Once | SUBCUTANEOUS | Status: AC
  Filled 2023-07-22: qty 1.7

## 2023-07-22 MED ORDER — OXYCODONE HCL ER 15 MG PO T12A
15.0000 mg | EXTENDED_RELEASE_TABLET | Freq: Two times a day (BID) | ORAL | 0 refills | Status: DC
  Filled 2023-07-22: qty 34, 17d supply, fill #0

## 2023-07-22 MED ORDER — SODIUM CHLORIDE 0.9% FLUSH
10.0000 mL | INTRAVENOUS | Status: DC | PRN
  Administered 2023-07-22: 10 mL

## 2023-07-22 MED ORDER — SODIUM CHLORIDE 0.9 % IV SOLN: Freq: Once | INTRAVENOUS | Status: AC

## 2023-07-22 MED ORDER — HEPARIN SOD (PORK) LOCK FLUSH 100 UNIT/ML IV SOLN: 500.0000 [IU] | Freq: Once | INTRAVENOUS | Status: AC | PRN

## 2023-07-22 MED ORDER — OXYCODONE HCL 10 MG PO TABS
10.0000 mg | ORAL_TABLET | ORAL | 0 refills | Status: DC | PRN
  Filled 2023-07-22 (×2): qty 90, 15d supply, fill #0

## 2023-07-22 NOTE — Patient Instructions (Signed)
 [facility] - A DEPT OF [facility]  Discharge Instructions: Thank you for choosing [facility] to provide your oncology and hematology care.   If you have a lab appointment with the Cancer Center, please go directly to the Cancer Center and check in at the registration area.  Wear comfortable clothing and clothing appropriate for easy access to any Portacath or PICC line.   We strive to give you quality time with your provider. You may need to reschedule your appointment if you arrive late (15 or more minutes).  Arriving late affects you and other patients whose appointments are after yours.  Also, if you miss three or more appointments without notifying the office, you may be dismissed from the clinic at the provider's discretion.      For prescription refill requests, have your pharmacy contact our office and allow 72 hours for refills to be completed.    Today you received the following chemotherapy and/or immunotherapy agents opdivo      To help prevent nausea and vomiting after your treatment, we encourage you to take your nausea medication as directed.  BELOW ARE SYMPTOMS THAT SHOULD BE REPORTED IMMEDIATELY: *FEVER GREATER THAN 100.4 F (38 C) OR HIGHER *CHILLS OR SWEATING *NAUSEA AND VOMITING THAT IS NOT CONTROLLED WITH YOUR NAUSEA MEDICATION *UNUSUAL SHORTNESS OF BREATH *UNUSUAL BRUISING OR BLEEDING *URINARY PROBLEMS (pain or burning when urinating, or frequent urination) *BOWEL PROBLEMS (unusual diarrhea, constipation, pain near the anus) TENDERNESS IN MOUTH AND THROAT WITH OR WITHOUT PRESENCE OF ULCERS (sore throat, sores in mouth, or a toothache) UNUSUAL RASH, SWELLING OR PAIN  UNUSUAL VAGINAL DISCHARGE OR ITCHING   Items with * indicate a potential emergency and should be followed up as soon as possible or go to the Emergency Department if any problems should occur.  Please show the CHEMOTHERAPY ALERT CARD or IMMUNOTHERAPY ALERT  CARD at check-in to the Emergency Department and triage nurse. Should you have questions after your visit or need to cancel or reschedule your appointment, please contact [facility] - A DEPT OF [facility]  [phone] and follow the prompts.  Office hours are 8:00 a.m. to 4:30 p.m. Monday - Friday. Please note that voicemails left after 4:00 p.m. may not be returned until the following business day.  We are closed weekends and major holidays. You have access to a nurse at all times for urgent questions. Please call the main number to the clinic [phone] and follow the prompts.  For any non-urgent questions, you may also contact your provider using MyChart. We now offer e-Visits for anyone 24 and older to request care online for non-urgent symptoms. For details visit [email or web address].   Also download the MyChart app! Go to the app store, search "MyChart", open the app, select [place], and log in with your MyChart username and password.

## 2023-07-22 NOTE — Progress Notes (Signed)
 Hematology and Oncology Follow Up Visit  Vincent David [id number] 1965-10-22 58 y.o. 07/22/2023   Principle Diagnosis:  Metastatic Renal Cell Carcinoma - bone mets  Current Therapy:   Nivoumab/Ipilimumab  -- s/p cycle #5 -- started on 09/29/2019 -- maintenance Nivolumab  q 2 month -- Started on 12/29/2019 Xgeva  120 mg sq q 2 months -- next dose in 09/2023  XRT to right iliac mass/ L5 vertebral body/ Left femoral neck LEFT hip repair Cryoablation of left kidney mass-10/19/2020     Interim History:  Vincent David is back for follow-up.  We last saw him 2 months ago.  Since then, he has been doing pretty well.  One of his sons got married I think back in April.  Everything went well with the wedding.  He still is having some pain because of the tumor back in the lower back.  He has had radiotherapy in that area already.  He has had no problems with cough.  He still smoking a little bit.  He has had no fever.  He has had no bleeding.  He has had no headache.  His last PET scan was done back in early April.  We will have to probably repeat another 1 before I see him back.  However he has had no change in bowel or bladder habits.  He has had no rashes.  He has had no problem with his thyroid .  Today, his TSH was 4.3.  Currently, I would have to say that his performance status is probably ECOG 1.   Medications:  Current Outpatient Medications:    amLODipine  (NORVASC ) 5 MG tablet, Take 1 tablet (5 mg total) by mouth daily., Disp: 90 tablet, Rfl: 5   aspirin  EC 81 MG tablet, Take 1 tablet (81 mg total) by mouth 2 (two) times daily., Disp: 60 tablet, Rfl: 0   cloBAZam  (ONFI ) 10 MG tablet, TAKE 1 TABLET BY MOUTH EVERYDAY AT BEDTIME, Disp: 30 tablet, Rfl: 5   ibuprofen  (ADVIL ) 200 MG tablet, Take 400 mg by mouth every 8 (eight) hours as needed (pain.)., Disp: , Rfl:    lamoTRIgine  (LAMICTAL ) 100 MG tablet, Take 2 tablets (200 mg total) by mouth 2 (two) times daily., Disp: 360 tablet, Rfl: 3    lamoTRIgine  (LAMICTAL ) 25 MG tablet, Take 2 tablets (50 mg total) by mouth 2 (two) times daily., Disp: 360 tablet, Rfl: 3   oxyCODONE  (OXYCONTIN ) 15 mg 12 hr tablet, Take 1 tablet (15 mg total) by mouth every 12 (twelve) hours., Disp: 34 tablet, Rfl: 0   Oxycodone  HCl 10 MG TABS, Take 1 tablet (10 mg total) by mouth every 4 (four) hours as needed., Disp: 90 tablet, Rfl: 0   SUMAtriptan  (IMITREX ) 50 MG tablet, Take 50 mg by mouth every 2 (two) hours as needed for migraine. May repeat in 2 hours if headache persists or recurs. (Patient not taking: Reported on 07/02/2023), Disp: , Rfl:  No current facility-administered medications for this visit.  Facility-Administered Medications Ordered in Other Visits:    sodium chloride  flush (NS) 0.9 % injection 10 mL, 10 mL, Intracatheter, PRN, Cartrell Bentsen R, MD, 10 mL at 05/19/20 1343   sodium chloride  flush (NS) 0.9 % injection 10 mL, 10 mL, Intravenous, PRN, Laterra Lubinski R, MD, 10 mL at 08/23/22 1610  Allergies: No Known Allergies  Past Medical History, Surgical history, Social history, and Family History were reviewed and updated.  Review of Systems: Review of Systems  Constitutional: Negative.   HENT:  Negative.  Eyes: Negative.   Respiratory: Negative.    Cardiovascular: Negative.   Gastrointestinal: Negative.   Endocrine: Negative.   Musculoskeletal:  Positive for back pain and flank pain.  Skin: Negative.   Neurological: Negative.   Hematological: Negative.   Psychiatric/Behavioral: Negative.      Physical Exam:  height is 5' 7 (1.702 m) and weight is 214 lb (97.1 kg). His oral temperature is 98.2 F (36.8 C). His blood pressure is 135/71 and his pulse is 76. His respiration is 18 and oxygen  saturation is 97%.   Wt Readings from Last 3 Encounters:  07/22/23 214 lb (97.1 kg)  07/02/23 213 lb 3.2 oz (96.7 kg)  05/27/23 212 lb (96.2 kg)    Physical Exam Vitals reviewed.  HENT:     Head: Normocephalic and atraumatic.   Eyes:      Pupils: Pupils are equal, round, and reactive to light.    Cardiovascular:     Rate and Rhythm: Normal rate and regular rhythm.     Heart sounds: Normal heart sounds.  Pulmonary:     Effort: Pulmonary effort is normal.     Breath sounds: Normal breath sounds.  Abdominal:     General: Bowel sounds are normal.     Palpations: Abdomen is soft.   Musculoskeletal:        General: No tenderness or deformity. Normal range of motion.     Cervical back: Normal range of motion.  Lymphadenopathy:     Cervical: No cervical adenopathy.   Skin:    General: Skin is warm and dry.     Findings: No erythema or rash.   Neurological:     Mental Status: He is alert and oriented to person, place, and time.   Psychiatric:        Behavior: Behavior normal.        Thought Content: Thought content normal.        Judgment: Judgment normal.     Lab Results  Component Value Date   WBC 7.6 07/22/2023   HGB 14.8 07/22/2023   HCT 44.1 07/22/2023   MCV 91.5 07/22/2023   PLT 212 07/22/2023     Chemistry      Component Value Date/Time   NA 139 07/22/2023 0810   NA 137 12/04/2022 1132   K 4.1 07/22/2023 0810   CL 104 07/22/2023 0810   CO2 26 07/22/2023 0810   BUN 19 07/22/2023 0810   BUN 15 12/04/2022 1132   CREATININE 1.13 07/22/2023 0810   CREATININE 1.15 08/25/2019 1450      Component Value Date/Time   CALCIUM 9.3 07/22/2023 0810   ALKPHOS 108 07/22/2023 0810   AST 13 (L) 07/22/2023 0810   ALT 15 07/22/2023 0810   BILITOT 0.4 07/22/2023 0810      Impression and Plan: Vincent David is a very nice 58 year old white male.  He has metastatic renal cell carcinoma.  For some reason, his disease clearly favors his bones.  His lungs and liver and lymph nodes all appear to be clean.  Again, we will go ahead and get him set up with another PET scan before we see him back.  If everything looks good, we might think about moving his nivolumab  out every 3 months.  I am just happy that his  quality life is doing well.  I hate that he still having the back discomfort.  I know he is on OxyContin  and oxycodone  which seem to help and this gives him a good quality  of life.  We will plan to get him back in 2 months.   6/16/20259:01 AM

## 2023-07-22 NOTE — Patient Instructions (Signed)

## 2023-07-22 NOTE — Progress Notes (Addendum)
 Proceed with Nivolumab  today.  Jobe Mulder [place], [place], BCPS, BCOP 07/22/2023 9:30 AM

## 2023-07-23 ENCOUNTER — Other Ambulatory Visit: Payer: Self-pay

## 2023-07-23 LAB — PSA, TOTAL AND FREE
PSA, Free Pct: 61.3 %
PSA, Free: 0.49 ng/mL
Prostate Specific Ag, Serum: 0.8 ng/mL (ref 0.0–4.0)

## 2023-07-29 ENCOUNTER — Other Ambulatory Visit: Payer: Self-pay | Admitting: Physician Assistant

## 2023-07-29 DIAGNOSIS — J0100 Acute maxillary sinusitis, unspecified: Secondary | ICD-10-CM

## 2023-07-30 ENCOUNTER — Telehealth: Payer: Self-pay | Admitting: Adult Health

## 2023-07-30 NOTE — Telephone Encounter (Signed)
 Request to r/s appointment

## 2023-08-01 ENCOUNTER — Ambulatory Visit: Payer: BC Managed Care – PPO | Admitting: Adult Health

## 2023-08-13 ENCOUNTER — Ambulatory Visit (INDEPENDENT_AMBULATORY_CARE_PROVIDER_SITE_OTHER): Admitting: Adult Health

## 2023-08-13 ENCOUNTER — Other Ambulatory Visit: Payer: Self-pay

## 2023-08-13 ENCOUNTER — Other Ambulatory Visit: Payer: Self-pay | Admitting: Hematology & Oncology

## 2023-08-13 VITALS — BP 139/70 | HR 77 | Ht 67.0 in | Wt 213.0 lb

## 2023-08-13 DIAGNOSIS — R569 Unspecified convulsions: Secondary | ICD-10-CM

## 2023-08-13 DIAGNOSIS — G4733 Obstructive sleep apnea (adult) (pediatric): Secondary | ICD-10-CM

## 2023-08-13 NOTE — Patient Instructions (Signed)
 Your Plan:  Continue Lamictal  and clobazam  We will ask Dr. Timmy to check Lamictal  level Can discuss Lamictal  dosing with Dr. Gregg at the next visit.  If you would like a sooner visit please let us  know     Thank you for coming to see us  at [facility]. I hope we have been able to provide you high quality care today.  You may receive a patient satisfaction survey over the next few weeks. We would appreciate your feedback and comments so that we may continue to improve ourselves and the health of our patients.

## 2023-08-13 NOTE — Progress Notes (Signed)
 PATIENT: Vincent David DOB: November 06, 1965  REASON FOR VISIT: follow up HISTORY FROM: patient  Chief Complaint  Patient presents with   Follow-up    RM5--alone ESS -seizures, cpap Pt stated--had no seizure since 2023.     HISTORY OF PRESENT ILLNESS: Today 08/13/23:  Vincent David is a 58 y.o. male with a history of OSA on CPAP and seizures. Returns today for follow-up.  Overall he reports he is doing well.  He remains on Lamictal  and clobazam .  He states that he has not had a seizure event since 2023.  He states that since clobazam  was added he has not had any seizures.  He is questioning whether he needs to stay on Lamictal  long-term.  CPAP report is below.       12/04/22:Vincent David is a 58 y.o. male with a history of OSA on CPAP and seizures . Returns today for follow-up.  He reports that the CPAP is working well.  He denies any new issues.  He reports that since he started on clobazam  he has not had any seizure events.  He remains on Lamictal  250 mg twice daily as well.  Lamictal  was entered into the system as 100 mg and 25 mg tablets.  He does verify that he does take 25 mg tablets so he does not have to break the 100 mg tablet.  He does report that his sleep is fragmented.  He sleeps about 2 to 3 hours at a time may wake up but he eventually does go back to sleep.  His download is below    REVIEW OF SYSTEMS: Out of a complete 14 system review of symptoms, the patient complains only of the following symptoms, and all other reviewed systems are negative.  ALLERGIES: No Known Allergies  HOME MEDICATIONS: Outpatient Medications Prior to Visit  Medication Sig Dispense Refill   amLODipine  (NORVASC ) 5 MG tablet Take 1 tablet (5 mg total) by mouth daily. 90 tablet 5   aspirin  EC 81 MG tablet Take 1 tablet (81 mg total) by mouth 2 (two) times daily. 60 tablet 0   Azelastine  HCl 137 MCG/SPRAY SOLN Place 1 spray into both nostrils every evening. Needs appt 30 mL 0   cloBAZam   (ONFI ) 10 MG tablet TAKE 1 TABLET BY MOUTH EVERYDAY AT BEDTIME 30 tablet 5   fluticasone  (FLONASE ) 50 MCG/ACT nasal spray Place 2 sprays into both nostrils at bedtime. Needs appt 16 mL 0   ibuprofen  (ADVIL ) 200 MG tablet Take 400 mg by mouth every 8 (eight) hours as needed (pain.).     lamoTRIgine  (LAMICTAL ) 100 MG tablet Take 2 tablets (200 mg total) by mouth 2 (two) times daily. 360 tablet 3   lamoTRIgine  (LAMICTAL ) 25 MG tablet Take 2 tablets (50 mg total) by mouth 2 (two) times daily. 360 tablet 3   oxyCODONE  (OXYCONTIN ) 15 mg 12 hr tablet Take 1 tablet (15 mg total) by mouth every 12 (twelve) hours. 34 tablet 0   Oxycodone  HCl 10 MG TABS Take 1 tablet (10 mg total) by mouth every 4 (four) hours as needed. 90 tablet 0   SUMAtriptan  (IMITREX ) 50 MG tablet Take 50 mg by mouth every 2 (two) hours as needed for migraine. May repeat in 2 hours if headache persists or recurs.     Facility-Administered Medications Prior to Visit  Medication Dose Route Frequency Provider Last Rate Last Admin   sodium chloride  flush (NS) 0.9 % injection 10 mL  10 mL Intracatheter PRN Timmy Coy  R, MD   10 mL at 05/19/20 1343   sodium chloride  flush (NS) 0.9 % injection 10 mL  10 mL Intravenous PRN Timmy Maude SAUNDERS, MD   10 mL at 08/23/22 1610    PAST MEDICAL HISTORY: Past Medical History:  Diagnosis Date   Cancer [facility])    Cancer of kidney (HCC)    Goals of care, counseling/discussion 08/28/2019   History of COVID-19 10/03/2020   Positive COVID swab test   Postictal headache    Seizure (HCC)    Sleep apnea    Tinnitus     PAST SURGICAL HISTORY: Past Surgical History:  Procedure Laterality Date   IR IMAGING GUIDED PORT INSERTION  09/28/2019   IR RADIOLOGIST EVAL & MGMT  08/30/2020   IR RADIOLOGIST EVAL & MGMT  12/01/2020   IR RADIOLOGIST EVAL & MGMT  06/12/2021   IR RADIOLOGIST EVAL & MGMT  06/27/2021   IR RADIOLOGIST EVAL & MGMT  11/10/2021   KNEE SURGERY Left    ORIF PELVIC FRACTURE Left 09/14/2019    Procedure: OPEN REDUCTION INTERNAL FIXATION (ORIF) LEFT HIP WITH AFFIXUS NAIL;  Surgeon: Liam Lerner, MD;  Location: [facility];  Service: Orthopedics;  Laterality: Left;   RADIOLOGY WITH ANESTHESIA N/A 10/19/2020   Procedure: CT CYROABLATION WITH ANESTHESIA;  Surgeon: Karalee Wilkie POUR, MD;  Location: [facility];  Service: Radiology;  Laterality: N/A;   SHOULDER SURGERY Left     FAMILY HISTORY: Family History  Problem Relation Age of Onset   Sleep apnea Mother    Diabetes Mother    High blood pressure Father    Cancer Maternal Uncle        type unknown either pancreatic or kidney   Seizures Neg Hx     SOCIAL HISTORY: Social History   Socioeconomic History   Marital status: Married    Spouse name: Ellouise   Number of children: 3   Years of education: Not on file   Highest education level: Not on file  Occupational History    Employer: OTHER  Tobacco Use   Smoking status: Every Day    Current packs/day: 0.25    Average packs/day: 0.3 packs/day for 15.0 years (3.8 ttl pk-yrs)    Types: Cigarettes   Smokeless tobacco: Never   Tobacco comments:    Not quite 0.5 ppd  Vaping Use   Vaping status: Never Used  Substance and Sexual Activity   Alcohol use: Yes    Comment: occ wine   Drug use: Never   Sexual activity: Yes    Partners: Female  Other Topics Concern   Not on file  Social History Narrative   Caffeine: 2 cups/day   Social Drivers of Corporate investment banker Strain: Not on file  Food Insecurity: Not on file  Transportation Needs: Not on file  Physical Activity: Not on file  Stress: Not on file  Social Connections: Not on file  Intimate Partner Violence: Not on file      PHYSICAL EXAM Generalized: Well developed, in no acute distress   Neurological examination  Mentation: Alert oriented to time, place, history taking. Follows all commands speech and language fluent Cranial nerve II-XII:Extraocular movements were full. Facial symmetry noted. uvula tongue  midline. Head turning and shoulder shrug  were normal and symmetric. Motor: Good strength throughout subjectively per patient Sensory: Sensory testing is intact to soft touch on all 4 extremities subjectively per patient Coordination: Cerebellar testing reveals good finger-nose-finger  Gait and station: Patient is able to  stand from a seated position. gait is normal.    DIAGNOSTIC DATA (LABS, IMAGING, TESTING) - I reviewed patient records, labs, notes, testing and imaging myself where available.  Lab Results  Component Value Date   WBC 7.6 07/22/2023   HGB 14.8 07/22/2023   HCT 44.1 07/22/2023   MCV 91.5 07/22/2023   PLT 212 07/22/2023      Component Value Date/Time   NA 139 07/22/2023 0810   NA 137 12/04/2022 1132   K 4.1 07/22/2023 0810   CL 104 07/22/2023 0810   CO2 26 07/22/2023 0810   GLUCOSE 112 (H) 07/22/2023 0810   BUN 19 07/22/2023 0810   BUN 15 12/04/2022 1132   CREATININE 1.13 07/22/2023 0810   CREATININE 1.15 08/25/2019 1450   CALCIUM 9.3 07/22/2023 0810   PROT 6.8 07/22/2023 0810   PROT 6.7 12/04/2022 1132   ALBUMIN 4.4 07/22/2023 0810   ALBUMIN 4.5 12/04/2022 1132   AST 13 (L) 07/22/2023 0810   ALT 15 07/22/2023 0810   ALKPHOS 108 07/22/2023 0810   BILITOT 0.4 07/22/2023 0810   GFRNONAA >60 07/22/2023 0810   GFRNONAA 71 08/25/2019 1450   GFRAA >60 10/20/2019 1117   GFRAA 83 08/25/2019 1450   Lab Results  Component Value Date   CHOL 183 01/09/2022   HDL 29.10 (L) 01/09/2022   LDLCALC 146 (H) 02/04/2019   LDLDIRECT 140.0 01/09/2022   TRIG 204.0 (H) 01/09/2022   CHOLHDL 6 01/09/2022   Lab Results  Component Value Date   HGBA1C 6.2 01/09/2022   No results found for: VITAMINB12 Lab Results  Component Value Date   TSH 4.320 07/22/2023      ASSESSMENT AND PLAN 58 y.o. year old male  has a past medical history of Cancer (HCC), Cancer of kidney (HCC), Goals of care, counseling/discussion (08/28/2019), History of COVID-19 (10/03/2020), Postictal  headache, Seizure (HCC), Sleep apnea, and Tinnitus. here with:  OSA on CPAP  CPAP compliance excellent Residual AHI is good Encouraged patient to continue using CPAP nightly and > 4 hours each night  Seizures  Continue Lamictal  250 mg twice a day Continue clobazam  10 mg at bedtime Will ask Dr. Timmy to check Lamictal  level as next blood draw Advised that if he has any seizure events he should let us  know His next appointment will be with Dr. Gregg.  He wants to discuss potentially reducing Lamictal .  Patient requested to follow-up in 1 year.  Did offer sooner appointment if he wanted it.      Duwaine Russell, MSN, NP-C 08/13/2023, 8:34 AM [facility] [address] [place], [place] [address] [phone]  The patient's condition requires frequent monitoring and adjustments in the treatment plan, reflecting the ongoing complexity of care.  This provider is the continuing focal point for all needed services for this condition.

## 2023-08-14 ENCOUNTER — Other Ambulatory Visit: Payer: Self-pay

## 2023-08-15 ENCOUNTER — Other Ambulatory Visit: Payer: Self-pay | Admitting: *Deleted

## 2023-08-15 ENCOUNTER — Encounter: Payer: Self-pay | Admitting: Hematology & Oncology

## 2023-08-15 DIAGNOSIS — C642 Malignant neoplasm of left kidney, except renal pelvis: Secondary | ICD-10-CM

## 2023-08-15 DIAGNOSIS — Z9189 Other specified personal risk factors, not elsewhere classified: Secondary | ICD-10-CM

## 2023-08-15 NOTE — Progress Notes (Signed)
 Vincent David

## 2023-08-16 ENCOUNTER — Encounter: Payer: Self-pay | Admitting: *Deleted

## 2023-08-16 ENCOUNTER — Encounter: Payer: Self-pay | Admitting: Hematology & Oncology

## 2023-08-16 DIAGNOSIS — C642 Malignant neoplasm of left kidney, except renal pelvis: Secondary | ICD-10-CM

## 2023-08-16 NOTE — Progress Notes (Signed)
 See MyChart message dated 08/15/2023. Reviewed with Dr Timmy. Referral to ENT placed. Dr Timmy would also like a CT of Face. Order placed. Patient notified.   Oncology Nurse Navigator Documentation     08/16/2023    8:45 AM  Oncology Nurse Navigator Flowsheets  Navigator Location [facility]  Navigator Encounter Type MyChart  Patient Visit Type MedOnc  Treatment Phase Active Tx  Barriers/Navigation Needs Coordination of Care;Education  Education Other  Interventions Coordination of Care;Education;Referrals  Acuity Level 2-Minimal Needs (1-2 Barriers Identified)  Referrals Other  Coordination of Care Other  Education Method Written  Time Spent with Patient 15

## 2023-08-26 ENCOUNTER — Ambulatory Visit: Payer: Self-pay | Admitting: Hematology & Oncology

## 2023-08-26 ENCOUNTER — Encounter: Payer: Self-pay | Admitting: *Deleted

## 2023-08-26 ENCOUNTER — Ambulatory Visit (HOSPITAL_BASED_OUTPATIENT_CLINIC_OR_DEPARTMENT_OTHER)
Admission: RE | Admit: 2023-08-26 | Discharge: 2023-08-26 | Disposition: A | Discharge status: Home / Self Care | Payer: 59 | Source: Ambulatory Visit | Attending: Hematology & Oncology | Admitting: Hematology & Oncology

## 2023-08-26 ENCOUNTER — Encounter (HOSPITAL_BASED_OUTPATIENT_CLINIC_OR_DEPARTMENT_OTHER): Payer: Self-pay

## 2023-08-26 DIAGNOSIS — C642 Malignant neoplasm of left kidney, except renal pelvis: Secondary | ICD-10-CM | POA: Insufficient documentation

## 2023-08-26 MED ORDER — IOHEXOL 300 MG/ML  SOLN
100.0000 mL | Freq: Once | INTRAMUSCULAR | Status: AC | PRN
  Administered 2023-08-26: 80 mL via INTRAVENOUS

## 2023-08-27 ENCOUNTER — Encounter (INDEPENDENT_AMBULATORY_CARE_PROVIDER_SITE_OTHER): Payer: Self-pay | Admitting: Otolaryngology

## 2023-08-27 ENCOUNTER — Ambulatory Visit (INDEPENDENT_AMBULATORY_CARE_PROVIDER_SITE_OTHER): Payer: 59 | Admitting: Otolaryngology

## 2023-08-27 VITALS — BP 154/73 | HR 83

## 2023-08-27 DIAGNOSIS — R0981 Nasal congestion: Secondary | ICD-10-CM | POA: Diagnosis not present

## 2023-08-27 DIAGNOSIS — J3089 Other allergic rhinitis: Secondary | ICD-10-CM

## 2023-08-27 DIAGNOSIS — H6990 Unspecified Eustachian tube disorder, unspecified ear: Secondary | ICD-10-CM | POA: Diagnosis not present

## 2023-08-27 DIAGNOSIS — R0982 Postnasal drip: Secondary | ICD-10-CM

## 2023-08-27 DIAGNOSIS — H6993 Unspecified Eustachian tube disorder, bilateral: Secondary | ICD-10-CM

## 2023-08-27 DIAGNOSIS — J329 Chronic sinusitis, unspecified: Secondary | ICD-10-CM

## 2023-08-27 DIAGNOSIS — F17210 Nicotine dependence, cigarettes, uncomplicated: Secondary | ICD-10-CM | POA: Diagnosis not present

## 2023-08-27 DIAGNOSIS — Z720 Tobacco use: Secondary | ICD-10-CM

## 2023-08-27 MED ORDER — LEVOCETIRIZINE DIHYDROCHLORIDE 5 MG PO TABS: 5.0000 mg | ORAL_TABLET | Freq: Every evening | ORAL | 3 refills | Status: DC

## 2023-08-27 NOTE — Patient Instructions (Addendum)
 Dear Vincent David,   Congratulations for your interest in quitting smoking!  Find a program that suits you best: when you want to quit, how you need support, where you live, and how you like to learn.    If you're ready to get started TODAY, consider scheduling a visit through [email or web address].  Appointments are available from 8am to 8pm, Monday to Friday.   Most health insurance plans will cover some level of tobacco cessation visits and medications.    Additional Resources: [employer] are also available to help you quit & provide the support you'll need. Many programs are available in both [place] and Spanish and have a long history of successfully helping people get off and stay off tobacco.    Quit Smoking Apps:  quitSTART at [employer].de QuitGuide?at ForgetParking.dk Online education and resources: Smokefree  at [employer].gov Free Telephone Coaching: QuitNow,  Call 1-800-QUIT-NOW (954-117-8880) or Text- Ready to 307 734 0891 *Quitline [place] has teamed up with Medicaid to offer a free 14 week program    Vaping- Want to Quit? Free 24/7 support. Call [facility]  [place], [place], [place], [place], [place]  [place]     See information about Eustachian Tube Dysfunction below:    Overview The eustachian (say you-STAY-shee-un) tubes connect the middle ear on each side to the back of the throat. They keep air pressure stable in the ears. If your eustachian tubes become blocked, the air pressure in your ears changes. A quick change in air pressure can cause eustachian tubes to close up. This might happen when an airplane changes altitude or when a scuba diver goes up or down underwater. And a cold can make the tubes swell and block the fluid in the middle ear from draining out. That can cause pain.  Eustachian tube problems often clear up on their own or after treating the cause of the blockage. If  your tubes continue to be blocked, you may need surgery.  Follow-up care is a key part of your treatment and safety. Be sure to make and go to all appointments, and call your doctor or nurse advice line (811 in most provinces and territories) if you are having problems. It's also a good idea to know your test results and keep a list of the medicines you take.  How can you care for yourself at home? Try a simple exercise to help open blocked tubes. Close your mouth, hold your nose, and gently blow as if you are blowing your nose. Yawning and chewing gum also may help. You may hear or feel a pop when the tubes open. To ease ear pain, apply a warm face cloth or a heating pad set on low. There may be some drainage from the ear when the heat melts earwax. Put a cloth between the heat source and your skin. If your doctor prescribed antibiotics, take them as directed. Do not stop taking them just because you feel better. You need to take the full course of antibiotics. Be safe with medicines. Depending on the cause of the problem, your doctor may recommend over-the-counter medicine. For example, adults may try decongestants for cold symptoms or nasal spray steroids for allergies. Follow the instructions carefully.

## 2023-08-27 NOTE — Progress Notes (Signed)
 ENT CONSULT:  Reason for Consult: chronic nasal congestion and concern for jaw lesion (noted by a dentist)   HPI: Discussed the use of AI scribe software for clinical note transcription with the patient, who gave verbal consent to proceed.  History of Present Illness Vincent David is a 58 year old male current smoker, trying to quit on his own currently, hx of OSA on CPAP, hx of renal cell cancer, on immunotherapy, who presents with chronic nasal congestion and sinus inflammation.  He had an infected tooth which caused swelling, all symptoms resolved. CT max/face was ordered and done yesterday and showed no jaw lesions but evidence of chronic sinusitis.   He has been experiencing chronic nasal congestion and sinus inflammation, noted on a recent PET scan. He has been using nasal sprays, including azelastine  and Flonase . He uses a CPAP machine.  He has a history of renal cell cancer and is currently on immunotherapy. He denies current use of chemotherapy drugs.  He is currently taking aspirin  and Amlodipine  for mild hypertension. Not on blood thinners.   He smokes but is attempting to quit, acknowledging smoking as an irritant contributing to his nasal congestion.     Records Reviewed:  [facility] Neurology note from 08/13/23 NP Duwaine Russell  Today 08/13/23:   Vincent David is a 58 y.o. male with a history of OSA on CPAP and seizures. Returns today for follow-up.  Overall he reports he is doing well.  He remains on Lamictal  and clobazam .  He states that he has not had a seizure event since 2023.  He states that since clobazam  was added he has not had any seizures.  He is questioning whether he needs to stay on Lamictal  long-term.  CPAP report is below.   58 y.o. year old male  has a past medical history of Cancer (HCC), Cancer of kidney (HCC), Goals of care, counseling/discussion (08/28/2019), History of COVID-19 (10/03/2020), Postictal headache, Seizure (HCC), Sleep apnea, and Tinnitus.  here with:   OSA on CPAP   CPAP compliance excellent Residual AHI is good Encouraged patient to continue using CPAP nightly and > 4 hours each night   Seizures   Continue Lamictal  250 mg twice a day Continue clobazam  10 mg at bedtime Will ask Dr. Timmy to check Lamictal  level as next blood draw Advised that if he has any seizure events he should let us  know His next appointment will be with Dr. Gregg.  He wants to discuss potentially reducing Lamictal .  Patient requested to follow-up in 1 year.  Did offer sooner appointment if he wanted it.  Past Medical History:  Diagnosis Date   Cancer [facility])    Cancer of kidney (HCC)    Goals of care, counseling/discussion 08/28/2019   History of COVID-19 10/03/2020   Positive COVID swab test   Postictal headache    Seizure (HCC)    Sleep apnea    Tinnitus     Past Surgical History:  Procedure Laterality Date   IR IMAGING GUIDED PORT INSERTION  09/28/2019   IR RADIOLOGIST EVAL & MGMT  08/30/2020   IR RADIOLOGIST EVAL & MGMT  12/01/2020   IR RADIOLOGIST EVAL & MGMT  06/12/2021   IR RADIOLOGIST EVAL & MGMT  06/27/2021   IR RADIOLOGIST EVAL & MGMT  11/10/2021   KNEE SURGERY Left    ORIF PELVIC FRACTURE Left 09/14/2019   Procedure: OPEN REDUCTION INTERNAL FIXATION (ORIF) LEFT HIP WITH AFFIXUS NAIL;  Surgeon: Liam Lerner, MD;  Location: [facility];  Service: Orthopedics;  Laterality: Left;   RADIOLOGY WITH ANESTHESIA N/A 10/19/2020   Procedure: CT CYROABLATION WITH ANESTHESIA;  Surgeon: Karalee Wilkie POUR, MD;  Location: [facility];  Service: Radiology;  Laterality: N/A;   SHOULDER SURGERY Left     Family History  Problem Relation Age of Onset   Sleep apnea Mother    Diabetes Mother    High blood pressure Father    Cancer Maternal Uncle        type unknown either pancreatic or kidney   Seizures Neg Hx     Social History:  reports that he has been smoking cigarettes. He has a 3.8 pack-year smoking history. He has never used smokeless tobacco.  He reports current alcohol use. He reports that he does not use drugs.  Allergies: No Known Allergies  Medications: I have reviewed the patient's current medications.  The PMH, PSH, Medications, Allergies, and SH were reviewed and updated.  ROS: Constitutional: Negative for fever, weight loss and weight gain. Cardiovascular: Negative for chest pain and dyspnea on exertion. Respiratory: Is not experiencing shortness of breath at rest. Gastrointestinal: Negative for nausea and vomiting. Neurological: Negative for headaches. Psychiatric: The patient is not nervous/anxious  Blood pressure (!) 154/73, pulse 83, SpO2 92%. There is no height or weight on file to calculate BMI.  PHYSICAL EXAM:  Exam: General: Well-developed, well-nourished Respiratory Respiratory effort: Equal inspiration and expiration without stridor Cardiovascular Peripheral Vascular: Warm extremities with equal color/perfusion Eyes: No nystagmus with equal extraocular motion bilaterally Neuro/Psych/Balance: Patient oriented to person, place, and time; Appropriate mood and affect; Gait is intact with no imbalance; Cranial nerves I-XII are intact Head and Face Inspection: Normocephalic and atraumatic without mass or lesion Palpation: Facial skeleton intact without bony stepoffs Salivary Glands: No mass or tenderness Facial Strength: Facial motility symmetric and full bilaterally ENT Pinna: External ear intact and fully developed External canal: Canal is patent with intact skin Tympanic Membrane: Clear and mobile External Nose: No scar or anatomic deformity Internal Nose: Septum is deviated to the right and S-shaped. No polyp, or purulence. Mucosal edema and erythema present.  Bilateral inferior turbinate hypertrophy.  Lips, Teeth, and gums: Mucosa and teeth intact and viable TMJ: No pain to palpation with full mobility Oral cavity/oropharynx: No erythema or exudate, no lesions present Nasopharynx: No mass or  lesion with intact mucosa Neck Neck and Trachea: Midline trachea without mass or lesion Thyroid : No mass or nodularity Lymphatics: No lymphadenopathy  Procedure:   PROCEDURE NOTE: nasal endoscopy  Preoperative diagnosis: chronic sinusitis symptoms  Postoperative diagnosis: same  Procedure: Diagnostic nasal endoscopy (68768)  Surgeon: Elena Larry, M.D.  Anesthesia: Topical lidocaine  and Afrin  H&P REVIEW: The patient's history and physical were reviewed today prior to procedure. All medications were reviewed and updated as well. Complications: None Condition is stable throughout exam Indications and consent: The patient presents with symptoms of chronic sinusitis not responding to previous therapies. All the risks, benefits, and potential complications were reviewed with the patient preoperatively and informed consent was obtained. The time out was completed with confirmation of the correct procedure.   Procedure: The patient was seated upright in the clinic. Topical lidocaine  and Afrin were applied to the nasal cavity. After adequate anesthesia had occurred, the rigid nasal endoscope was passed into the nasal cavity. The nasal mucosa, turbinates, septum, and sinus drainage pathways were visualized bilaterally. This revealed no purulence or significant secretions that might be cultured. There were no polyps or sites of significant inflammation. The mucosa was  intact and there was no crusting present. The scope was then slowly withdrawn and the patient tolerated the procedure well. There were no complications or blood loss.   Studies Reviewed: CT max/face 08/26/23 CLINICAL DATA:  Lesion of mandible identified by dentist. History of metastatic renal cell carcinoma. Left facial pain from tooth infection that was removed.   EXAM: CT MAXILLOFACIAL WITHOUT AND WITH CONTRAST   TECHNIQUE: Multidetector CT imaging of the maxillofacial structures was performed without and with  intravenous contrast. Multiplanar CT image reconstructions were also generated.   RADIATION DOSE REDUCTION: This exam was performed according to the departmental dose-optimization program which includes automated exposure control, adjustment of the mA and/or kV according to patient size and/or use of iterative reconstruction technique.   CONTRAST:  80mL OMNIPAQUE  IOHEXOL  300 MG/ML  SOLN   COMPARISON:  PET-CT 05/16/2023 and 01/14/2023.   FINDINGS: Osseous: No evidence of acute fracture, dislocation or aggressive osseous lesion. Specifically, no focal abnormalities are identified within the mandible. The mandible appeared unremarkable on previous PET-CT. The temporomandibular joints are intact. Several mandibular molars are absent. Chronic mucosal thickening and scattered fluid levels within the maxillary and ethmoid sinuses bilaterally, similar to previous PET-CT. The mastoid air cells and middle ears are clear.   Orbits: Unremarkable. No evidence of soft tissue mass or inflammation.   Sinuses: As above, chronic mucosal thickening and scattered fluid levels within the maxillary and ethmoid sinuses bilaterally.   Soft tissues: No evidence of facial soft tissue mass or abnormal enhancement.   Limited intracranial: Unremarkable.   IMPRESSION: 1. No evidence of mandibular lesion or other acute findings. 2. Chronic maxillary and ethmoid sinusitis bilaterally    Assessment/Plan: Encounter Diagnoses  Name Primary?   Dysfunction of both eustachian tubes Yes   Chronic sinusitis, unspecified location    Tobacco abuse    Chronic nasal congestion    Environmental and seasonal allergies    Post-nasal drip     Assessment and Plan Assessment & Plan Chronic Nasal congestion and CRS without polyps  Chronic nasal congestion with chronic sinus inflammation previously seen on PET/CT and recently noted on CT max/face done yesterday. CPAP use and smoking contribute to sx. Enlarged  turbinates exacerbate symptoms. We discussed surgery 2/2 persistent nature of his condition. Risks and benefits were discussed. I suspect environmental allergies, currently on nasal sprays but does not take antihistamine.  - Prescribed Xyzal  5 mg at night. - Continue azelastine  and Flonase  nasal sprays. - Schedule outpatient surgery for b/l FESS (maxillary/ethmoid and ITR). - Advise nasal saline rinses    Eustachian tube dysfunction Eustachian tube dysfunction due to nasal congestion and enlarged turbinates. - Medically treat nasal congestion as above - Consider surgical intervention for nasal passage patency during sinus surgery such as ITR.  Tobacco use disorder.  We had an extensive discussion about detrimental effects of smoking on overall health. I provided resources available at [facility] to assist with smoking cessation. I spent 4 min on counseling  - Advised smoking cessation  Renal cell carcinoma Renal cell carcinoma with ongoing immunotherapy. No current immunosuppression from chemotherapy. - continue Oncology follow-up     Thank you for allowing me to participate in the care of this patient. Please do not hesitate to contact me with any questions or concerns.   Elena Larry, MD Otolaryngology [facility] Phone: [phone] Fax: [phone]    08/27/2023, 8:59 AM

## 2023-08-30 ENCOUNTER — Encounter (HOSPITAL_COMMUNITY)
Admission: RE | Admit: 2023-08-30 | Discharge: 2023-08-30 | Disposition: A | Discharge status: Home / Self Care | Payer: 59 | Source: Ambulatory Visit | Attending: Hematology & Oncology | Admitting: Hematology & Oncology

## 2023-08-30 ENCOUNTER — Other Ambulatory Visit: Payer: Self-pay | Admitting: Medical

## 2023-08-30 DIAGNOSIS — C642 Malignant neoplasm of left kidney, except renal pelvis: Secondary | ICD-10-CM | POA: Insufficient documentation

## 2023-08-30 DIAGNOSIS — J0100 Acute maxillary sinusitis, unspecified: Secondary | ICD-10-CM

## 2023-08-30 LAB — GLUCOSE, CAPILLARY: Glucose-Capillary: 112 mg/dL — ABNORMAL HIGH (ref 70–99)

## 2023-08-30 MED ORDER — FLUDEOXYGLUCOSE F - 18 (FDG) INJECTION
10.6000 | Freq: Once | INTRAVENOUS | Status: AC
  Administered 2023-08-30: 10.6 via INTRAVENOUS

## 2023-09-05 ENCOUNTER — Encounter: Payer: Self-pay | Admitting: *Deleted

## 2023-09-05 ENCOUNTER — Ambulatory Visit: Payer: Self-pay | Admitting: Hematology & Oncology

## 2023-09-06 ENCOUNTER — Other Ambulatory Visit: Payer: Self-pay | Admitting: Medical

## 2023-09-06 DIAGNOSIS — J0100 Acute maxillary sinusitis, unspecified: Secondary | ICD-10-CM

## 2023-09-16 ENCOUNTER — Other Ambulatory Visit: Payer: Self-pay

## 2023-09-16 ENCOUNTER — Inpatient Hospital Stay (HOSPITAL_BASED_OUTPATIENT_CLINIC_OR_DEPARTMENT_OTHER): Payer: 59 | Admitting: Hematology & Oncology

## 2023-09-16 ENCOUNTER — Inpatient Hospital Stay: Payer: 59 | Attending: Hematology & Oncology

## 2023-09-16 ENCOUNTER — Inpatient Hospital Stay: Payer: 59

## 2023-09-16 ENCOUNTER — Encounter: Payer: Self-pay | Admitting: Hematology & Oncology

## 2023-09-16 ENCOUNTER — Encounter: Payer: Self-pay | Admitting: *Deleted

## 2023-09-16 ENCOUNTER — Other Ambulatory Visit (HOSPITAL_BASED_OUTPATIENT_CLINIC_OR_DEPARTMENT_OTHER): Payer: Self-pay

## 2023-09-16 ENCOUNTER — Other Ambulatory Visit: Payer: Self-pay | Admitting: Hematology & Oncology

## 2023-09-16 VITALS — BP 121/59 | HR 72 | Temp 98.4°F | Resp 18 | Wt 212.0 lb

## 2023-09-16 VITALS — BP 114/57 | HR 71 | Resp 18

## 2023-09-16 DIAGNOSIS — C7951 Secondary malignant neoplasm of bone: Secondary | ICD-10-CM | POA: Insufficient documentation

## 2023-09-16 DIAGNOSIS — C642 Malignant neoplasm of left kidney, except renal pelvis: Secondary | ICD-10-CM | POA: Diagnosis not present

## 2023-09-16 DIAGNOSIS — Z7982 Long term (current) use of aspirin: Secondary | ICD-10-CM | POA: Insufficient documentation

## 2023-09-16 DIAGNOSIS — Z5112 Encounter for antineoplastic immunotherapy: Secondary | ICD-10-CM | POA: Insufficient documentation

## 2023-09-16 DIAGNOSIS — Z79899 Other long term (current) drug therapy: Secondary | ICD-10-CM | POA: Diagnosis not present

## 2023-09-16 DIAGNOSIS — N402 Nodular prostate without lower urinary tract symptoms: Secondary | ICD-10-CM | POA: Insufficient documentation

## 2023-09-16 DIAGNOSIS — R569 Unspecified convulsions: Secondary | ICD-10-CM

## 2023-09-16 DIAGNOSIS — Z9189 Other specified personal risk factors, not elsewhere classified: Secondary | ICD-10-CM

## 2023-09-16 LAB — CMP (CANCER CENTER ONLY)
ALT: 20 U/L (ref 0–44)
AST: 19 U/L (ref 15–41)
Albumin: 4.3 g/dL (ref 3.5–5.0)
Alkaline Phosphatase: 128 U/L — ABNORMAL HIGH (ref 38–126)
Anion gap: 13 (ref 5–15)
BUN: 22 mg/dL — ABNORMAL HIGH (ref 6–20)
CO2: 23 mmol/L (ref 22–32)
Calcium: 8.8 mg/dL — ABNORMAL LOW (ref 8.9–10.3)
Chloride: 100 mmol/L (ref 98–111)
Creatinine: 1.46 mg/dL — ABNORMAL HIGH (ref 0.61–1.24)
GFR, Estimated: 55 mL/min — ABNORMAL LOW (ref >60)
Glucose, Bld: 125 mg/dL — ABNORMAL HIGH (ref 70–99)
Potassium: 4 mmol/L (ref 3.5–5.1)
Sodium: 136 mmol/L (ref 135–145)
Total Bilirubin: 0.3 mg/dL (ref 0.0–1.2)
Total Protein: 6.7 g/dL (ref 6.5–8.1)

## 2023-09-16 LAB — CBC WITH DIFFERENTIAL (CANCER CENTER ONLY)
Abs Immature Granulocytes: 0.07 K/uL (ref 0.00–0.07)
Basophils Absolute: 0.1 K/uL (ref 0.0–0.1)
Basophils Relative: 1 %
Eosinophils Absolute: 0.4 K/uL (ref 0.0–0.5)
Eosinophils Relative: 5 %
HCT: 43.4 % (ref 39.0–52.0)
Hemoglobin: 14.7 g/dL (ref 13.0–17.0)
Immature Granulocytes: 1 %
Lymphocytes Relative: 15 %
Lymphs Abs: 1.2 K/uL (ref 0.7–4.0)
MCH: 31.5 pg (ref 26.0–34.0)
MCHC: 33.9 g/dL (ref 30.0–36.0)
MCV: 92.9 fL (ref 80.0–100.0)
Monocytes Absolute: 0.5 K/uL (ref 0.1–1.0)
Monocytes Relative: 6 %
Neutro Abs: 6 K/uL (ref 1.7–7.7)
Neutrophils Relative %: 72 %
Platelet Count: 201 K/uL (ref 150–400)
RBC: 4.67 MIL/uL (ref 4.22–5.81)
RDW: 14.1 % (ref 11.5–15.5)
WBC Count: 8.2 K/uL (ref 4.0–10.5)
nRBC: 0 % (ref 0.0–0.2)

## 2023-09-16 LAB — LACTATE DEHYDROGENASE: LDH: 135 U/L (ref 98–192)

## 2023-09-16 LAB — TSH: TSH: 3.37 u[IU]/mL (ref 0.350–4.500)

## 2023-09-16 MED ORDER — SODIUM CHLORIDE 0.9 % IV SOLN
480.0000 mg | Freq: Once | INTRAVENOUS | Status: AC
  Administered 2023-09-16 (×2): 480 mg via INTRAVENOUS
  Filled 2023-09-16: qty 48

## 2023-09-16 MED ORDER — SODIUM CHLORIDE 0.9 % IV SOLN
Freq: Once | INTRAVENOUS | Status: AC
Start: 2023-09-16 — End: 2023-09-16

## 2023-09-16 MED ORDER — DENOSUMAB 120 MG/1.7ML ~~LOC~~ SOLN
120.0000 mg | Freq: Once | SUBCUTANEOUS | Status: AC
Start: 2023-09-16 — End: 2023-09-16
  Administered 2023-09-16 (×2): 120 mg via SUBCUTANEOUS
  Filled 2023-09-16: qty 1.7

## 2023-09-16 MED ORDER — OXYCODONE HCL 10 MG PO TABS
10.0000 mg | ORAL_TABLET | ORAL | 0 refills | Status: DC | PRN
  Filled 2023-09-16: qty 90, 15d supply, fill #0

## 2023-09-16 MED ORDER — OXYCODONE HCL ER 15 MG PO T12A
15.0000 mg | EXTENDED_RELEASE_TABLET | Freq: Two times a day (BID) | ORAL | 0 refills | Status: DC
  Filled 2023-09-16: qty 34, 17d supply, fill #0

## 2023-09-16 NOTE — Patient Instructions (Signed)
 Nivolumab  Injection What is this medication? NIVOLUMAB  (nye VOL ue mab) treats some types of cancer. It works by helping your immune system slow or stop the spread of cancer cells. It is a monoclonal antibody. This medicine may be used for other purposes; ask your health care provider or pharmacist if you have questions. COMMON BRAND NAME(S): Opdivo  What should I tell my care team before I take this medication? They need to know if you have any of these conditions: Allogeneic stem cell transplant (uses someone else's stem cells) Autoimmune diseases, such as Crohn disease, ulcerative colitis, lupus History of chest radiation Nervous system problems, such as Guillain-Barre syndrome or myasthenia gravis Organ transplant An unusual or allergic reaction to nivolumab , other medications, foods, dyes, or preservatives Pregnant or trying to get pregnant Breast-feeding How should I use this medication? This medication is infused into a vein. It is given in a hospital or clinic setting. A special MedGuide will be given to you before each treatment. Be sure to read this information carefully each time. Talk to your care team about the use of this medication in children. While it may be prescribed for children as young as 12 years for selected conditions, precautions do apply. Overdosage: If you think you have taken too much of this medicine contact a poison control center or emergency room at once. NOTE: This medicine is only for you. Do not share this medicine with others. What if I miss a dose? Keep appointments for follow-up doses. It is important not to miss your dose. Call your care team if you are unable to keep an appointment. What may interact with this medication? Interactions have not been studied. This list may not describe all possible interactions. Give your health care provider a list of all the medicines, herbs, non-prescription drugs, or dietary supplements you use. Also tell them if you  smoke, drink alcohol, or use illegal drugs. Some items may interact with your medicine. What should I watch for while using this medication? Your condition will be monitored carefully while you are receiving this medication. You may need blood work while taking this medication. This medication may cause serious skin reactions. They can happen weeks to months after starting the medication. Contact your care team right away if you notice fevers or flu-like symptoms with a rash. The rash may be red or purple and then turn into blisters or peeling of the skin. You may also notice a red rash with swelling of the face, lips, or lymph nodes in your neck or under your arms. Tell your care team right away if you have any change in your eyesight. Talk to your care team if you are pregnant or think you might be pregnant. A negative pregnancy test is required before starting this medication. A reliable form of contraception is recommended while taking this medication and for 5 months after the last dose. Talk to your care team about effective forms of contraception. Do not breast-feed while taking this medication and for 5 months after the last dose. What side effects may I notice from receiving this medication? Side effects that you should report to your care team as soon as possible: Allergic reactions--skin rash, itching, hives, swelling of the face, lips, tongue, or throat Dry cough, shortness of breath or trouble breathing Eye pain, redness, irritation, or discharge with blurry or decreased vision Heart muscle inflammation--unusual weakness or fatigue, shortness of breath, chest pain, fast or irregular heartbeat, dizziness, swelling of the ankles, feet, or hands Hormone  gland problems--headache, sensitivity to light, unusual weakness or fatigue, dizziness, fast or irregular heartbeat, increased sensitivity to cold or heat, excessive sweating, constipation, hair loss, increased thirst or amount of urine,  tremors or shaking, irritability Infusion reactions--chest pain, shortness of breath or trouble breathing, feeling faint or lightheaded Kidney injury (glomerulonephritis)--decrease in the amount of urine, red or dark brown urine, foamy or bubbly urine, swelling of the ankles, hands, or feet Liver injury--right upper belly pain, loss of appetite, nausea, light-colored stool, dark yellow or brown urine, yellowing skin or eyes, unusual weakness or fatigue Pain, tingling, or numbness in the hands or feet, muscle weakness, change in vision, confusion or trouble speaking, loss of balance or coordination, trouble walking, seizures Rash, fever, and swollen lymph nodes Redness, blistering, peeling, or loosening of the skin, including inside the mouth Sudden or severe stomach pain, bloody diarrhea, fever, nausea, vomiting Side effects that usually do not require medical attention (report these to your care team if they continue or are bothersome): Bone, joint, or muscle pain Diarrhea Fatigue Loss of appetite Nausea Skin rash This list may not describe all possible side effects. Call your doctor for medical advice about side effects. You may report side effects to FDA at 1-800-FDA-1088. Where should I keep my medication? This medication is given in a hospital or clinic. It will not be stored at home. NOTE: This sheet is a summary. It may not cover all possible information. If you have questions about this medicine, talk to your doctor, pharmacist, or health care provider.  2024 Elsevier/Gold Standard (2021-05-22 00:00:00)Denosumab  Injection (Oncology) What is this medication? DENOSUMAB  (den oh SUE mab) prevents weakened bones caused by cancer. It may also be used to treat noncancerous bone tumors that cannot be removed by surgery. It can also be used to treat high calcium levels in the blood caused by cancer. It works by blocking a protein that causes bones to break down quickly. This slows down the  release of calcium from bones, which lowers calcium levels in your blood. It also makes your bones stronger and less likely to break (fracture). This medicine may be used for other purposes; ask your health care provider or pharmacist if you have questions. COMMON BRAND NAME(S): XGEVA  What should I tell my care team before I take this medication? They need to know if you have any of these conditions: Dental disease Having surgery or tooth extraction Infection Kidney disease Low levels of calcium or vitamin D in the blood Malnutrition On hemodialysis Skin conditions or sensitivity Thyroid  or parathyroid disease An unusual reaction to denosumab , other medications, foods, dyes, or preservatives Pregnant or trying to get pregnant Breast-feeding How should I use this medication? This medication is for injection under the skin. It is given by your care team in a hospital or clinic setting. A special MedGuide will be given to you before each treatment. Be sure to read this information carefully each time. Talk to your care team about the use of this medication in children. While it may be prescribed for children as young as 13 years for selected conditions, precautions do apply. Overdosage: If you think you have taken too much of this medicine contact a poison control center or emergency room at once. NOTE: This medicine is only for you. Do not share this medicine with others. What if I miss a dose? Keep appointments for follow-up doses. It is important not to miss your dose. Call your care team if you are unable to keep an appointment.  What may interact with this medication? Do not take this medication with any of the following: Other medications containing denosumab  This medication may also interact with the following: Medications that lower your chance of fighting infection Steroid medications, such as prednisone  or cortisone This list may not describe all possible interactions. Give your  health care provider a list of all the medicines, herbs, non-prescription drugs, or dietary supplements you use. Also tell them if you smoke, drink alcohol, or use illegal drugs. Some items may interact with your medicine. What should I watch for while using this medication? Your condition will be monitored carefully while you are receiving this medication. You may need blood work while taking this medication. This medication may increase your risk of getting an infection. Call your care team for advice if you get a fever, chills, sore throat, or other symptoms of a cold or flu. Do not treat yourself. Try to avoid being around people who are sick. You should make sure you get enough calcium and vitamin D while you are taking this medication, unless your care team tells you not to. Discuss the foods you eat and the vitamins you take with your care team. Some people who take this medication have severe bone, joint, or muscle pain. This medication may also increase your risk for jaw problems or a broken thigh bone. Tell your care team right away if you have severe pain in your jaw, bones, joints, or muscles. Tell your care team if you have any pain that does not go away or that gets worse. Talk to your care team if you may be pregnant. Serious birth defects can occur if you take this medication during pregnancy and for 5 months after the last dose. You will need a negative pregnancy test before starting this medication. Contraception is recommended while taking this medication and for 5 months after the last dose. Your care team can help you find the option that works for you. What side effects may I notice from receiving this medication? Side effects that you should report to your care team as soon as possible: Allergic reactions--skin rash, itching, hives, swelling of the face, lips, tongue, or throat Bone, joint, or muscle pain Low calcium level--muscle pain or cramps, confusion, tingling, or numbness in  the hands or feet Osteonecrosis of the jaw--pain, swelling, or redness in the mouth, numbness of the jaw, poor healing after dental work, unusual discharge from the mouth, visible bones in the mouth Side effects that usually do not require medical attention (report to your care team if they continue or are bothersome): Cough Diarrhea Fatigue Headache Nausea This list may not describe all possible side effects. Call your doctor for medical advice about side effects. You may report side effects to FDA at 1-800-FDA-1088. Where should I keep my medication? This medication is given in a hospital or clinic. It will not be stored at home. NOTE: This sheet is a summary. It may not cover all possible information. If you have questions about this medicine, talk to your doctor, pharmacist, or health care provider.  2024 Elsevier/Gold Standard (2021-06-14 00:00:00)

## 2023-09-16 NOTE — Progress Notes (Signed)
 Hematology and Oncology Follow Up Visit  Vincent David [id number] 13-Aug-1965 58 y.o. 09/16/2023   Principle Diagnosis:  Metastatic Renal Cell Carcinoma - bone mets  Current Therapy:   Nivoumab/Ipilimumab  -- s/p cycle #5 -- started on 09/29/2019 -- maintenance Nivolumab  q 2 month -- Started on 12/29/2019 Xgeva  120 mg sq q 2 months -- next dose in 11/2023  XRT to right iliac mass/ L5 vertebral body/ Left femoral neck LEFT hip repair Cryoablation of left kidney mass-10/19/2020     Interim History:  Vincent David is back for follow-up.  Overall, things are going pretty well for him.  However, he did see ENT.  Sounds like they have sinus surgery.  He did have a CT scan of the sinuses.  This was done on 08/26/2023.  There is nothing that looks like malignancy.  I think he did have some thickened turbinates.  He is going have surgery on 10/28/2023.  He did have a PET scan that was done on 08/30/2023.  There is nothing that looks like recurrent or metastatic disease that was active.  There is a slight increase in activity in left prostate nodule.  He has had no problems urinating.  He has had no problem with bowels.  He has had no leg swelling.  Has had no cough or shortness of breath.  He is still smoking.  He is trying to cut back.  He has had no headache.  He has had no nausea or vomiting.  His last TSH in June was 4.3.    Overall, I would say that his  performance status is ECOG 1.     Medications:  Current Outpatient Medications:    amLODipine  (NORVASC ) 5 MG tablet, Take 1 tablet (5 mg total) by mouth daily., Disp: 90 tablet, Rfl: 5   aspirin  EC 81 MG tablet, Take 1 tablet (81 mg total) by mouth 2 (two) times daily., Disp: 60 tablet, Rfl: 0   Azelastine  HCl 137 MCG/SPRAY SOLN, PLACE 1 SPRAY INTO BOTH NOSTRILS EVERY EVENING. NEEDS APPT, Disp: 0.137 mL, Rfl: 2   cloBAZam  (ONFI ) 10 MG tablet, TAKE 1 TABLET BY MOUTH EVERYDAY AT BEDTIME, Disp: 30 tablet, Rfl: 5   fluticasone  (FLONASE ) 50 MCG/ACT  nasal spray, PLACE 2 SPRAYS INTO BOTH NOSTRILS AT BEDTIME. NEEDS APPT, Disp: 16 mL, Rfl: 0   ibuprofen  (ADVIL ) 200 MG tablet, Take 400 mg by mouth every 8 (eight) hours as needed (pain.)., Disp: , Rfl:    lamoTRIgine  (LAMICTAL ) 100 MG tablet, Take 2 tablets (200 mg total) by mouth 2 (two) times daily., Disp: 360 tablet, Rfl: 3   lamoTRIgine  (LAMICTAL ) 25 MG tablet, Take 2 tablets (50 mg total) by mouth 2 (two) times daily., Disp: 360 tablet, Rfl: 3   levocetirizine (XYZAL  ALLERGY 24HR) 5 MG tablet, Take 1 tablet (5 mg total) by mouth every evening., Disp: 30 tablet, Rfl: 3   SUMAtriptan  (IMITREX ) 50 MG tablet, Take 50 mg by mouth every 2 (two) hours as needed for migraine. May repeat in 2 hours if headache persists or recurs., Disp: , Rfl:    oxyCODONE  (OXYCONTIN ) 15 mg 12 hr tablet, Take 1 tablet (15 mg total) by mouth every 12 (twelve) hours., Disp: 34 tablet, Rfl: 0   Oxycodone  HCl 10 MG TABS, Take 1 tablet (10 mg total) by mouth every 4 (four) hours as needed., Disp: 90 tablet, Rfl: 0 No current facility-administered medications for this visit.  Facility-Administered Medications Ordered in Other Visits:    sodium chloride  flush (NS) 0.9 %  injection 10 mL, 10 mL, Intracatheter, PRN, Estus Krakowski R, MD, 10 mL at 05/19/20 1343   sodium chloride  flush (NS) 0.9 % injection 10 mL, 10 mL, Intravenous, PRN, Emilie Carp R, MD, 10 mL at 08/23/22 1610  Allergies: No Known Allergies  Past Medical History, Surgical history, Social history, and Family History were reviewed and updated.  Review of Systems: Review of Systems  Constitutional: Negative.   HENT:  Negative.    Eyes: Negative.   Respiratory: Negative.    Cardiovascular: Negative.   Gastrointestinal: Negative.   Endocrine: Negative.   Musculoskeletal:  Positive for back pain and flank pain.  Skin: Negative.   Neurological: Negative.   Hematological: Negative.   Psychiatric/Behavioral: Negative.      Physical Exam:  weight is  212 lb (96.2 kg). His oral temperature is 98.4 F (36.9 C). His blood pressure is 121/59 (abnormal) and his pulse is 72. His respiration is 18 and oxygen  saturation is 98%.   Wt Readings from Last 3 Encounters:  09/16/23 212 lb (96.2 kg)  08/13/23 213 lb (96.6 kg)  07/22/23 214 lb (97.1 kg)    Physical Exam Vitals reviewed.  HENT:     Head: Normocephalic and atraumatic.  Eyes:     Pupils: Pupils are equal, round, and reactive to light.  Cardiovascular:     Rate and Rhythm: Normal rate and regular rhythm.     Heart sounds: Normal heart sounds.  Pulmonary:     Effort: Pulmonary effort is normal.     Breath sounds: Normal breath sounds.  Abdominal:     General: Bowel sounds are normal.     Palpations: Abdomen is soft.  Musculoskeletal:        General: No tenderness or deformity. Normal range of motion.     Cervical back: Normal range of motion.  Lymphadenopathy:     Cervical: No cervical adenopathy.  Skin:    General: Skin is warm and dry.     Findings: No erythema or rash.  Neurological:     Mental Status: He is alert and oriented to person, place, and time.  Psychiatric:        Behavior: Behavior normal.        Thought Content: Thought content normal.        Judgment: Judgment normal.      Lab Results  Component Value Date   WBC 8.2 09/16/2023   HGB 14.7 09/16/2023   HCT 43.4 09/16/2023   MCV 92.9 09/16/2023   PLT 201 09/16/2023     Chemistry      Component Value Date/Time   NA 139 07/22/2023 0810   NA 137 12/04/2022 1132   K 4.1 07/22/2023 0810   CL 104 07/22/2023 0810   CO2 26 07/22/2023 0810   BUN 19 07/22/2023 0810   BUN 15 12/04/2022 1132   CREATININE 1.13 07/22/2023 0810   CREATININE 1.15 08/25/2019 1450      Component Value Date/Time   CALCIUM 9.3 07/22/2023 0810   ALKPHOS 108 07/22/2023 0810   AST 13 (L) 07/22/2023 0810   ALT 15 07/22/2023 0810   BILITOT 0.4 07/22/2023 0810      Impression and Plan: Vincent David is a very nice 58 year old  white male.  He has metastatic renal cell carcinoma.  For some reason, his disease clearly favors his bones.  His lungs and liver and lymph nodes all appear to be clean.  I am glad that the PET scan.  I do not see a  problem with him having surgery for the sinuses.  We will try to move his appointments out a little bit longer.  I did go and refill his pain medication.  He will receive his Xgeva  today.  Will plan to get him back to see us  in the Fall.   8/11/20258:14 AM

## 2023-09-16 NOTE — Patient Instructions (Signed)

## 2023-09-17 LAB — T4: T4, Total: 6.7 ug/dL (ref 4.5–12.0)

## 2023-09-18 LAB — LAMOTRIGINE LEVEL: Lamotrigine Lvl: 10.3 ug/mL (ref 2.0–20.0)

## 2023-09-24 ENCOUNTER — Ambulatory Visit (INDEPENDENT_AMBULATORY_CARE_PROVIDER_SITE_OTHER): Payer: 59 | Admitting: Otolaryngology

## 2023-10-09 ENCOUNTER — Other Ambulatory Visit: Payer: Self-pay | Admitting: Neurology

## 2023-10-10 NOTE — Telephone Encounter (Signed)
 Requested Prescriptions   Pending Prescriptions Disp Refills   cloBAZam  (ONFI ) 10 MG tablet [Pharmacy Med Name: CLOBAZAM  10 MG TABLET] 30 tablet     Sig: TAKE 1 TABLET BY MOUTH EVERYDAY AT BEDTIME   Last seen 08/13/23 Next appt 08/19/23 Dispenses   Dispensed Days Supply Quantity Provider Pharmacy  CLOBAZAM  10 MG TABLET 09/07/2023 30 30 each Camara, Amadou, MD [facility] [phone] - [address]...  CLOBAZAM  10 MG TABLET 08/08/2023 30 30 each Camara, Amadou, MD [facility] [phone] - [facility]...  CLOBAZAM  10 MG TABLET 07/09/2023 30 30 each Camara, Amadou, MD [facility] [phone] - [facility]...  CLOBAZAM  10 MG TABLET 06/11/2023 30 30 each Camara, Amadou, MD [facility] [phone] - [facility]...  CLOBAZAM  10 MG TABLET 05/10/2023 30 30 each Camara, Amadou, MD [facility] [phone] - [facility]...  CLOBAZAM  10 MG TABLET 04/11/2023 30 30 each Camara, Amadou, MD [facility] [phone] - W...  CLOBAZAM  10 MG TABLET 03/12/2023 30 30 each Camara, Amadou, MD [facility] [phone] - W...  CLOBAZAM  10 MG TABLET 02/10/2023 30 30 each Camara, Amadou, MD [facility] [phone] - W...  CLOBAZAM  10 MG TABLET 01/07/2023 30 30 each Camara, Amadou, MD [facility] [phone] - [facility]...  CLOBAZAM  10 MG TABLET 12/09/2022 30 30 each Camara, Amadou, MD [facility] [phone] - W...  CLOBAZAM  10 MG TABLET 11/06/2022 30 30 each Camara, Amadou, MD [facility] [phone] - [facility]...     Upon looking at chart he has appt w/camara and [facility] 2 days apart in July 2026. Which should he see?

## 2023-10-10 NOTE — Telephone Encounter (Signed)
 Please cancel my appointment. Patient is now following up with Megan. Thanks

## 2023-11-08 ENCOUNTER — Other Ambulatory Visit (HOSPITAL_BASED_OUTPATIENT_CLINIC_OR_DEPARTMENT_OTHER): Payer: Self-pay

## 2023-11-08 ENCOUNTER — Other Ambulatory Visit: Payer: Self-pay | Admitting: Hematology & Oncology

## 2023-11-08 ENCOUNTER — Other Ambulatory Visit: Payer: Self-pay

## 2023-11-08 MED ORDER — OXYCODONE HCL 10 MG PO TABS
10.0000 mg | ORAL_TABLET | ORAL | 0 refills | Status: DC | PRN
  Filled 2023-11-08: qty 90, 15d supply, fill #0

## 2023-11-08 MED ORDER — AMOXICILLIN 500 MG PO TABS
500.0000 mg | ORAL_TABLET | Freq: Three times a day (TID) | ORAL | 0 refills | Status: DC
  Filled 2023-11-08: qty 63, 21d supply, fill #0

## 2023-11-08 MED ORDER — NYSTATIN 100000 UNIT/ML MT SUSP
OROMUCOSAL | 3 refills | Status: AC
  Filled 2023-11-08: qty 240, 14d supply, fill #0
  Filled 2024-01-07: qty 240, 14d supply, fill #1
  Filled 2024-03-07: qty 240, 14d supply, fill #2

## 2023-11-11 ENCOUNTER — Ambulatory Visit (INDEPENDENT_AMBULATORY_CARE_PROVIDER_SITE_OTHER): Payer: 59 | Admitting: Otolaryngology

## 2023-11-12 ENCOUNTER — Encounter: Payer: Self-pay | Admitting: Hematology & Oncology

## 2023-11-12 ENCOUNTER — Inpatient Hospital Stay (HOSPITAL_BASED_OUTPATIENT_CLINIC_OR_DEPARTMENT_OTHER): Payer: 59 | Admitting: Hematology & Oncology

## 2023-11-12 ENCOUNTER — Other Ambulatory Visit: Payer: Self-pay

## 2023-11-12 ENCOUNTER — Inpatient Hospital Stay: Payer: 59

## 2023-11-12 ENCOUNTER — Inpatient Hospital Stay: Payer: 59 | Attending: Hematology & Oncology

## 2023-11-12 VITALS — BP 140/69 | HR 76 | Temp 98.0°F | Resp 18 | Ht 67.0 in | Wt 208.0 lb

## 2023-11-12 VITALS — BP 132/72 | HR 78

## 2023-11-12 DIAGNOSIS — C642 Malignant neoplasm of left kidney, except renal pelvis: Secondary | ICD-10-CM | POA: Insufficient documentation

## 2023-11-12 DIAGNOSIS — Z79899 Other long term (current) drug therapy: Secondary | ICD-10-CM | POA: Diagnosis not present

## 2023-11-12 DIAGNOSIS — J329 Chronic sinusitis, unspecified: Secondary | ICD-10-CM | POA: Diagnosis not present

## 2023-11-12 DIAGNOSIS — Z7982 Long term (current) use of aspirin: Secondary | ICD-10-CM | POA: Insufficient documentation

## 2023-11-12 DIAGNOSIS — Z5112 Encounter for antineoplastic immunotherapy: Secondary | ICD-10-CM | POA: Insufficient documentation

## 2023-11-12 DIAGNOSIS — G893 Neoplasm related pain (acute) (chronic): Secondary | ICD-10-CM | POA: Diagnosis not present

## 2023-11-12 DIAGNOSIS — C7951 Secondary malignant neoplasm of bone: Secondary | ICD-10-CM | POA: Insufficient documentation

## 2023-11-12 LAB — CBC WITH DIFFERENTIAL (CANCER CENTER ONLY)
Abs Immature Granulocytes: 0.04 K/uL (ref 0.00–0.07)
Basophils Absolute: 0 K/uL (ref 0.0–0.1)
Basophils Relative: 0 %
Eosinophils Absolute: 0.4 K/uL (ref 0.0–0.5)
Eosinophils Relative: 5 %
HCT: 45.5 % (ref 39.0–52.0)
Hemoglobin: 15.3 g/dL (ref 13.0–17.0)
Immature Granulocytes: 1 %
Lymphocytes Relative: 15 %
Lymphs Abs: 1.1 K/uL (ref 0.7–4.0)
MCH: 30.8 pg (ref 26.0–34.0)
MCHC: 33.6 g/dL (ref 30.0–36.0)
MCV: 91.5 fL (ref 80.0–100.0)
Monocytes Absolute: 0.4 K/uL (ref 0.1–1.0)
Monocytes Relative: 6 %
Neutro Abs: 5.2 K/uL (ref 1.7–7.7)
Neutrophils Relative %: 73 %
Platelet Count: 199 K/uL (ref 150–400)
RBC: 4.97 MIL/uL (ref 4.22–5.81)
RDW: 13.6 % (ref 11.5–15.5)
WBC Count: 7.2 K/uL (ref 4.0–10.5)
nRBC: 0 % (ref 0.0–0.2)

## 2023-11-12 LAB — CMP (CANCER CENTER ONLY)
ALT: 17 U/L (ref 0–44)
AST: 20 U/L (ref 15–41)
Albumin: 4.4 g/dL (ref 3.5–5.0)
Alkaline Phosphatase: 122 U/L (ref 38–126)
Anion gap: 13 (ref 5–15)
BUN: 16 mg/dL (ref 6–20)
CO2: 24 mmol/L (ref 22–32)
Calcium: 9 mg/dL (ref 8.9–10.3)
Chloride: 101 mmol/L (ref 98–111)
Creatinine: 1.05 mg/dL (ref 0.61–1.24)
GFR, Estimated: >60 mL/min (ref >60)
Glucose, Bld: 124 mg/dL — ABNORMAL HIGH (ref 70–99)
Potassium: 4.1 mmol/L (ref 3.5–5.1)
Sodium: 137 mmol/L (ref 135–145)
Total Bilirubin: 0.4 mg/dL (ref 0.0–1.2)
Total Protein: 6.9 g/dL (ref 6.5–8.1)

## 2023-11-12 LAB — TSH: TSH: 1.96 u[IU]/mL (ref 0.350–4.500)

## 2023-11-12 LAB — LACTATE DEHYDROGENASE: LDH: 208 U/L — ABNORMAL HIGH (ref 98–192)

## 2023-11-12 MED ORDER — SODIUM CHLORIDE 0.9 % IV SOLN: Freq: Once | INTRAVENOUS | Status: AC

## 2023-11-12 MED ORDER — SODIUM CHLORIDE 0.9 % IV SOLN
480.0000 mg | Freq: Once | INTRAVENOUS | Status: AC
  Administered 2023-11-12: 480 mg via INTRAVENOUS
  Filled 2023-11-12: qty 48

## 2023-11-12 NOTE — Progress Notes (Signed)
 Hematology and Oncology Follow Up Visit  Vincent David [id number] 25-May-1965 58 y.o. 11/12/2023   Principle Diagnosis:  Metastatic Renal Cell Carcinoma - bone mets  Current Therapy:   Nivoumab/Ipilimumab  -- s/p cycle #5 -- started on 09/29/2019 -- maintenance Nivolumab  q 2 month -- Started on 12/29/2019 Xgeva  120 mg sq q 2 months -- next dose in 11/2023  XRT to right iliac mass/ L5 vertebral body/ Left femoral neck LEFT hip repair Cryoablation of left kidney mass-10/19/2020     Interim History:  Vincent David is back for follow-up.  He is doing pretty well.  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  He really enjoys this.  He says keeps him busy.   He will have sinus surgery on October 27.  I do not see a problem with him having sinus surgery.  For right now, I will make sure that we disorder hold on his Xgeva  to make sure that nothing is going to be a problem with dealing with the bony structure of the sinuses.  He is having little bit more pain over on the right hip area.  He has seen interventional radiology in the past.  They have done some injections which have helped.  There is no problems with bowels or bladder.  He has had no problems with nausea or vomiting.  He has had no cough or shortness of breath.  There is been no leg pain.  He has had no leg swelling.  His last TSH was 3.4 in August.  Overall, I will have to say that his performance status is probably ECOG 0.   Medications:  Current Outpatient Medications:    amLODipine  (NORVASC ) 5 MG tablet, Take 1 tablet (5 mg total) by mouth daily., Disp: 90 tablet, Rfl: 5   amoxicillin (AMOXIL) 500 MG tablet, Take 1 tablet (500 mg total) by mouth 3 (three) times daily until gone., Disp: 63 tablet, Rfl: 0   aspirin  EC 81 MG tablet, Take 1 tablet (81 mg total) by mouth 2 (two) times daily., Disp: 60 tablet, Rfl: 0   Azelastine  HCl 137 MCG/SPRAY SOLN, PLACE 1 SPRAY INTO BOTH NOSTRILS EVERY EVENING. NEEDS APPT, Disp: 0.137 mL,  Rfl: 2   cloBAZam  (ONFI ) 10 MG tablet, TAKE 1 TABLET BY MOUTH EVERYDAY AT BEDTIME, Disp: 30 tablet, Rfl: 5   fluticasone  (FLONASE ) 50 MCG/ACT nasal spray, PLACE 2 SPRAYS INTO BOTH NOSTRILS AT BEDTIME. NEEDS APPT, Disp: 16 mL, Rfl: 0   hydrocortisone-nystatin-diphenhydrAMINE , Rinse with 1 tablespoonful (15 mL) 3 to 4 times daily as directed., Disp: 480 mL, Rfl: 3   ibuprofen  (ADVIL ) 200 MG tablet, Take 400 mg by mouth every 8 (eight) hours as needed (pain.)., Disp: , Rfl:    lamoTRIgine  (LAMICTAL ) 100 MG tablet, Take 2 tablets (200 mg total) by mouth 2 (two) times daily., Disp: 360 tablet, Rfl: 3   lamoTRIgine  (LAMICTAL ) 25 MG tablet, Take 2 tablets (50 mg total) by mouth 2 (two) times daily., Disp: 360 tablet, Rfl: 3   levocetirizine (XYZAL  ALLERGY 24HR) 5 MG tablet, Take 1 tablet (5 mg total) by mouth every evening., Disp: 30 tablet, Rfl: 3   oxyCODONE  (OXYCONTIN ) 15 mg 12 hr tablet, Take 1 tablet (15 mg total) by mouth every 12 (twelve) hours., Disp: 34 tablet, Rfl: 0   Oxycodone  HCl 10 MG TABS, Take 1 tablet (10 mg total) by mouth every 4 (four) hours as needed., Disp: 90 tablet, Rfl: 0   SUMAtriptan  (IMITREX ) 50 MG tablet, Take 50  mg by mouth every 2 (two) hours as needed for migraine. May repeat in 2 hours if headache persists or recurs., Disp: , Rfl:  No current facility-administered medications for this visit.  Facility-Administered Medications Ordered in Other Visits:    sodium chloride  flush (NS) 0.9 % injection 10 mL, 10 mL, Intracatheter, PRN, Takoda Siedlecki R, MD, 10 mL at 05/19/20 1343   sodium chloride  flush (NS) 0.9 % injection 10 mL, 10 mL, Intravenous, PRN, Tavie Haseman R, MD, 10 mL at 08/23/22 1610  Allergies: No Known Allergies  Past Medical History, Surgical history, Social history, and Family History were reviewed and updated.  Review of Systems: Review of Systems  Constitutional: Negative.   HENT:  Negative.    Eyes: Negative.   Respiratory: Negative.     Cardiovascular: Negative.   Gastrointestinal: Negative.   Endocrine: Negative.   Musculoskeletal:  Positive for back pain and flank pain.  Skin: Negative.   Neurological: Negative.   Hematological: Negative.   Psychiatric/Behavioral: Negative.      Physical Exam:  His vital signs show temperature of 98.  Pulse 76.  Blood pressure 140/69.  Weight is 208 pounds.  Wt Readings from Last 3 Encounters:  11/12/23 208 lb (94.3 kg)  09/16/23 212 lb (96.2 kg)  08/13/23 213 lb (96.6 kg)    Physical Exam Vitals reviewed.  HENT:     Head: Normocephalic and atraumatic.  Eyes:     Pupils: Pupils are equal, round, and reactive to light.  Cardiovascular:     Rate and Rhythm: Normal rate and regular rhythm.     Heart sounds: Normal heart sounds.  Pulmonary:     Effort: Pulmonary effort is normal.     Breath sounds: Normal breath sounds.  Abdominal:     General: Bowel sounds are normal.     Palpations: Abdomen is soft.  Musculoskeletal:        General: No tenderness or deformity. Normal range of motion.     Cervical back: Normal range of motion.  Lymphadenopathy:     Cervical: No cervical adenopathy.  Skin:    General: Skin is warm and dry.     Findings: No erythema or rash.  Neurological:     Mental Status: He is alert and oriented to person, place, and time.  Psychiatric:        Behavior: Behavior normal.        Thought Content: Thought content normal.        Judgment: Judgment normal.      Lab Results  Component Value Date   WBC 8.2 09/16/2023   HGB 14.7 09/16/2023   HCT 43.4 09/16/2023   MCV 92.9 09/16/2023   PLT 201 09/16/2023     Chemistry      Component Value Date/Time   NA 136 09/16/2023 0757   NA 137 12/04/2022 1132   K 4.0 09/16/2023 0757   CL 100 09/16/2023 0757   CO2 23 09/16/2023 0757   BUN 22 (H) 09/16/2023 0757   BUN 15 12/04/2022 1132   CREATININE 1.46 (H) 09/16/2023 0757   CREATININE 1.15 08/25/2019 1450      Component Value Date/Time    CALCIUM 8.8 (L) 09/16/2023 0757   ALKPHOS 128 (H) 09/16/2023 0757   AST 19 09/16/2023 0757   ALT 20 09/16/2023 0757   BILITOT 0.3 09/16/2023 0757      Impression and Plan: Vincent David is a very nice 58 year old white male.  He has metastatic renal cell carcinoma.  For  some reason, his disease clearly favors his bones.  His lungs and liver and lymph nodes all appear to be clean.  He will need another PET scan.  We will set 1 up for him in November.  Again, we will hold on the Xgeva  right now.  I want to make sure that everything is going be okay for his sinus surgery.  I think that there should not be any issue with his bones.  Again I just want to make sure that we do not cause any complications from surgery.  We will see about the PET scan.  I will plan to get him back sometime in November.  If all looks good, then we will get him through the Holiday season.    10/7/20258:27 AM

## 2023-11-12 NOTE — Patient Instructions (Signed)
 Nivolumab Injection What is this medication? NIVOLUMAB (nye VOL ue mab) treats some types of cancer. It works by helping your immune system slow or stop the spread of cancer cells. It is a monoclonal antibody. This medicine may be used for other purposes; ask your health care provider or pharmacist if you have questions. COMMON BRAND NAME(S): Opdivo What should I tell my care team before I take this medication? They need to know if you have any of these conditions: Allogeneic stem cell transplant (uses someone else's stem cells) Autoimmune diseases, such as Crohn disease, ulcerative colitis, lupus History of chest radiation Nervous system problems, such as Guillain-Barre syndrome or myasthenia gravis Organ transplant An unusual or allergic reaction to nivolumab, other medications, foods, dyes, or preservatives Pregnant or trying to get pregnant Breast-feeding How should I use this medication? This medication is infused into a vein. It is given in a hospital or clinic setting. A special MedGuide will be given to you before each treatment. Be sure to read this information carefully each time. Talk to your care team about the use of this medication in children. While it may be prescribed for children as young as 12 years for selected conditions, precautions do apply. Overdosage: If you think you have taken too much of this medicine contact a poison control center or emergency room at once. NOTE: This medicine is only for you. Do not share this medicine with others. What if I miss a dose? Keep appointments for follow-up doses. It is important not to miss your dose. Call your care team if you are unable to keep an appointment. What may interact with this medication? Interactions have not been studied. This list may not describe all possible interactions. Give your health care provider a list of all the medicines, herbs, non-prescription drugs, or dietary supplements you use. Also tell them if you  smoke, drink alcohol, or use illegal drugs. Some items may interact with your medicine. What should I watch for while using this medication? Your condition will be monitored carefully while you are receiving this medication. You may need blood work while taking this medication. This medication may cause serious skin reactions. They can happen weeks to months after starting the medication. Contact your care team right away if you notice fevers or flu-like symptoms with a rash. The rash may be red or purple and then turn into blisters or peeling of the skin. You may also notice a red rash with swelling of the face, lips, or lymph nodes in your neck or under your arms. Tell your care team right away if you have any change in your eyesight. Talk to your care team if you are pregnant or think you might be pregnant. A negative pregnancy test is required before starting this medication. A reliable form of contraception is recommended while taking this medication and for 5 months after the last dose. Talk to your care team about effective forms of contraception. Do not breast-feed while taking this medication and for 5 months after the last dose. What side effects may I notice from receiving this medication? Side effects that you should report to your care team as soon as possible: Allergic reactions--skin rash, itching, hives, swelling of the face, lips, tongue, or throat Dry cough, shortness of breath or trouble breathing Eye pain, redness, irritation, or discharge with blurry or decreased vision Heart muscle inflammation--unusual weakness or fatigue, shortness of breath, chest pain, fast or irregular heartbeat, dizziness, swelling of the ankles, feet, or hands Hormone  gland problems--headache, sensitivity to light, unusual weakness or fatigue, dizziness, fast or irregular heartbeat, increased sensitivity to cold or heat, excessive sweating, constipation, hair loss, increased thirst or amount of urine,  tremors or shaking, irritability Infusion reactions--chest pain, shortness of breath or trouble breathing, feeling faint or lightheaded Kidney injury (glomerulonephritis)--decrease in the amount of urine, red or dark brown urine, foamy or bubbly urine, swelling of the ankles, hands, or feet Liver injury--right upper belly pain, loss of appetite, nausea, light-colored stool, dark yellow or brown urine, yellowing skin or eyes, unusual weakness or fatigue Pain, tingling, or numbness in the hands or feet, muscle weakness, change in vision, confusion or trouble speaking, loss of balance or coordination, trouble walking, seizures Rash, fever, and swollen lymph nodes Redness, blistering, peeling, or loosening of the skin, including inside the mouth Sudden or severe stomach pain, bloody diarrhea, fever, nausea, vomiting Side effects that usually do not require medical attention (report these to your care team if they continue or are bothersome): Bone, joint, or muscle pain Diarrhea Fatigue Loss of appetite Nausea Skin rash This list may not describe all possible side effects. Call your doctor for medical advice about side effects. You may report side effects to FDA at 1-800-FDA-1088. Where should I keep my medication? This medication is given in a hospital or clinic. It will not be stored at home. NOTE: This sheet is a summary. It may not cover all possible information. If you have questions about this medicine, talk to your doctor, pharmacist, or health care provider.  2024 Elsevier/Gold Standard (2021-05-22 00:00:00)

## 2023-11-12 NOTE — Patient Instructions (Signed)

## 2023-11-13 LAB — T4: T4, Total: 6 ug/dL (ref 4.5–12.0)

## 2023-11-22 ENCOUNTER — Encounter (HOSPITAL_BASED_OUTPATIENT_CLINIC_OR_DEPARTMENT_OTHER): Payer: Self-pay | Admitting: *Deleted

## 2023-11-22 ENCOUNTER — Other Ambulatory Visit: Payer: Self-pay

## 2023-11-29 ENCOUNTER — Other Ambulatory Visit: Payer: Self-pay | Admitting: Adult Health

## 2023-12-02 ENCOUNTER — Ambulatory Visit (HOSPITAL_BASED_OUTPATIENT_CLINIC_OR_DEPARTMENT_OTHER): Admission: RE | Admit: 2023-12-02 | Payer: 59 | Source: Home / Self Care

## 2023-12-02 DIAGNOSIS — Z01818 Encounter for other preprocedural examination: Secondary | ICD-10-CM

## 2023-12-02 HISTORY — DX: Essential (primary) hypertension: I10

## 2023-12-02 SURGERY — REDUCTION, NASAL TURBINATE
Anesthesia: Choice

## 2023-12-04 ENCOUNTER — Other Ambulatory Visit: Payer: Self-pay

## 2023-12-04 ENCOUNTER — Encounter (HOSPITAL_BASED_OUTPATIENT_CLINIC_OR_DEPARTMENT_OTHER): Payer: Self-pay | Admitting: Emergency Medicine

## 2023-12-04 ENCOUNTER — Emergency Department (HOSPITAL_BASED_OUTPATIENT_CLINIC_OR_DEPARTMENT_OTHER): Payer: 59

## 2023-12-04 ENCOUNTER — Emergency Department (HOSPITAL_BASED_OUTPATIENT_CLINIC_OR_DEPARTMENT_OTHER)
Admission: EM | Admit: 2023-12-04 | Discharge: 2023-12-04 | Disposition: A | Discharge status: Home / Self Care | Payer: 59 | Attending: Emergency Medicine | Admitting: Emergency Medicine

## 2023-12-04 DIAGNOSIS — J40 Bronchitis, not specified as acute or chronic: Secondary | ICD-10-CM | POA: Insufficient documentation

## 2023-12-04 DIAGNOSIS — R059 Cough, unspecified: Secondary | ICD-10-CM | POA: Diagnosis present

## 2023-12-04 DIAGNOSIS — Z7951 Long term (current) use of inhaled steroids: Secondary | ICD-10-CM | POA: Diagnosis not present

## 2023-12-04 DIAGNOSIS — Z7982 Long term (current) use of aspirin: Secondary | ICD-10-CM | POA: Diagnosis not present

## 2023-12-04 DIAGNOSIS — R0602 Shortness of breath: Secondary | ICD-10-CM | POA: Diagnosis not present

## 2023-12-04 LAB — CBC
HCT: 49.1 % (ref 39.0–52.0)
Hemoglobin: 16.8 g/dL (ref 13.0–17.0)
MCH: 30.7 pg (ref 26.0–34.0)
MCHC: 34.2 g/dL (ref 30.0–36.0)
MCV: 89.8 fL (ref 80.0–100.0)
Platelets: 220 K/uL (ref 150–400)
RBC: 5.47 MIL/uL (ref 4.22–5.81)
RDW: 13.4 % (ref 11.5–15.5)
WBC: 8 K/uL (ref 4.0–10.5)
nRBC: 0 % (ref 0.0–0.2)

## 2023-12-04 LAB — BASIC METABOLIC PANEL WITH GFR
Anion gap: 13 (ref 5–15)
BUN: 14 mg/dL (ref 6–20)
CO2: 25 mmol/L (ref 22–32)
Calcium: 9.6 mg/dL (ref 8.9–10.3)
Chloride: 99 mmol/L (ref 98–111)
Creatinine, Ser: 1.06 mg/dL (ref 0.61–1.24)
GFR, Estimated: >60 mL/min (ref >60)
Glucose, Bld: 102 mg/dL — ABNORMAL HIGH (ref 70–99)
Potassium: 4.3 mmol/L (ref 3.5–5.1)
Sodium: 137 mmol/L (ref 135–145)

## 2023-12-04 LAB — RESP PANEL BY RT-PCR (RSV, FLU A&B, COVID)  RVPGX2
Influenza A by PCR: NEGATIVE
Influenza B by PCR: NEGATIVE
Resp Syncytial Virus by PCR: NEGATIVE
SARS Coronavirus 2 by RT PCR: NEGATIVE

## 2023-12-04 MED ORDER — METHYLPREDNISOLONE SODIUM SUCC 125 MG IJ SOLR
125.0000 mg | Freq: Once | INTRAMUSCULAR | Status: AC
  Administered 2023-12-04: 125 mg via INTRAVENOUS
  Filled 2023-12-04: qty 2

## 2023-12-04 MED ORDER — IOHEXOL 350 MG/ML SOLN
75.0000 mL | Freq: Once | INTRAVENOUS | Status: AC | PRN
Start: 2023-12-04 — End: 2023-12-04
  Administered 2023-12-04: 75 mL via INTRAVENOUS

## 2023-12-04 MED ORDER — AZITHROMYCIN 250 MG PO TABS
500.0000 mg | ORAL_TABLET | Freq: Once | ORAL | Status: AC
  Administered 2023-12-04: 500 mg via ORAL
  Filled 2023-12-04: qty 2

## 2023-12-04 MED ORDER — AZITHROMYCIN 250 MG PO TABS: 250.0000 mg | ORAL_TABLET | Freq: Every day | ORAL | 0 refills | Status: DC

## 2023-12-04 MED ORDER — IPRATROPIUM-ALBUTEROL 0.5-2.5 (3) MG/3ML IN SOLN
3.0000 mL | Freq: Once | RESPIRATORY_TRACT | Status: AC
  Administered 2023-12-04: 3 mL via RESPIRATORY_TRACT
  Filled 2023-12-04: qty 3

## 2023-12-04 MED ORDER — ALBUTEROL SULFATE HFA 108 (90 BASE) MCG/ACT IN AERS
2.0000 | INHALATION_SPRAY | Freq: Once | RESPIRATORY_TRACT | Status: AC
  Administered 2023-12-04: 2 via RESPIRATORY_TRACT
  Filled 2023-12-04: qty 6.7

## 2023-12-04 MED ORDER — PREDNISONE 20 MG PO TABS: 60.0000 mg | ORAL_TABLET | Freq: Every day | ORAL | 0 refills | Status: DC

## 2023-12-04 NOTE — ED Triage Notes (Signed)
 Pt reports recent viral illness last Thursday, worsening shob, cough.  Dyspnea on exertion.  Recently quit smoking.    Getting immunotherapy for stage 4 cancer.

## 2023-12-04 NOTE — Discharge Instructions (Signed)
 Please use albuterol inhaler 2 puffs every 4 hours as needed.  Finish antibiotics and steroids.  Drink plenty of fluids.  Follow-up with your primary care doctor.  Return to the emergency department if any worsening or concerning symptoms

## 2023-12-04 NOTE — ED Provider Notes (Signed)
 [facility] Provider Note   CSN: [id number] Arrival date & time: 12/04/23  8095     Patient presents with: Shortness of Breath   Vincent David is a 58 y.o. male.  He has a history of metastatic kidney cancer and is on chemotherapy.  He said he has been sick for almost a week with nasal congestion chest congestion minimally productive cough and feeling generally weak.  No hemoptysis.  Does not think he has had a fever.  No vomiting or diarrhea.  Has been trying some over-the-counter Mucinex without improvement.  Has an appointment with his PCP tomorrow but he could not wait.  {Add pertinent medical, surgical, social history, OB history to YEP:67052} The history is provided by the patient.  Shortness of Breath Severity:  Moderate Onset quality:  Gradual Duration:  1 week Timing:  Constant Chronicity:  New Relieved by:  Nothing Worsened by:  Activity and coughing Ineffective treatments:  Rest Associated symptoms: cough, sore throat and wheezing   Associated symptoms: no abdominal pain, no chest pain, no fever, no hemoptysis and no sputum production        Prior to Admission medications   Medication Sig Start Date End Date Taking? Authorizing Provider  amLODipine  (NORVASC ) 5 MG tablet Take 1 tablet (5 mg total) by mouth daily. 04/01/23   Timmy Maude SAUNDERS, MD  amoxicillin (AMOXIL) 500 MG tablet Take 1 tablet (500 mg total) by mouth 3 (three) times daily until gone. 11/08/23     aspirin  EC 81 MG tablet Take 1 tablet (81 mg total) by mouth 2 (two) times daily. 09/14/19   Orlando Locus K, PA-C  Azelastine  HCl 137 MCG/SPRAY SOLN PLACE 1 SPRAY INTO BOTH NOSTRILS EVERY EVENING. NEEDS APPT 09/06/23   Saguier, [place], PA-C  cloBAZam  (ONFI ) 10 MG tablet TAKE 1 TABLET BY MOUTH EVERYDAY AT BEDTIME 10/10/23   Gregg Lek, MD  fluticasone  (FLONASE ) 50 MCG/ACT nasal spray PLACE 2 SPRAYS INTO BOTH NOSTRILS AT BEDTIME. NEEDS APPT 08/30/23   Saguier, [place], PA-C   hydrocortisone-nystatin-diphenhydrAMINE  Rinse with 1 tablespoonful (15 mL) 3 to 4 times daily as directed. 11/08/23     ibuprofen  (ADVIL ) 200 MG tablet Take 400 mg by mouth every 8 (eight) hours as needed (pain.).    [provider]  lamoTRIgine  (LAMICTAL ) 100 MG tablet TAKE 2 TABLETS BY MOUTH 2 TIMES DAILY. 11/29/23   Millikan, Megan, NP  lamoTRIgine  (LAMICTAL ) 25 MG tablet TAKE 2 TABLETS BY MOUTH 2 TIMES DAILY. 11/29/23   Millikan, Megan, NP  oxyCODONE  (OXYCONTIN ) 15 mg 12 hr tablet Take 1 tablet (15 mg total) by mouth every 12 (twelve) hours. 09/16/23   Timmy Maude SAUNDERS, MD  Oxycodone  HCl 10 MG TABS Take 1 tablet (10 mg total) by mouth every 4 (four) hours as needed. 11/08/23   Timmy Maude SAUNDERS, MD  SUMAtriptan  (IMITREX ) 50 MG tablet Take 50 mg by mouth every 2 (two) hours as needed for migraine. May repeat in 2 hours if headache persists or recurs.    [provider]    Allergies: Patient has no known allergies.    Review of Systems  Constitutional:  Negative for fever.  HENT:  Positive for sore throat.   Respiratory:  Positive for cough, shortness of breath and wheezing. Negative for hemoptysis and sputum production.   Cardiovascular:  Negative for chest pain.  Gastrointestinal:  Negative for abdominal pain.    Updated Vital Signs BP (!) 171/72 (BP Location: Right Arm)  Pulse 86   Temp 98.2 F (36.8 C)   Resp 18   Ht 5' 7 (1.702 m)   Wt 93.9 kg   SpO2 93%   BMI 32.42 kg/m   Physical Exam Vitals and nursing note reviewed.  Constitutional:      Appearance: He is well-developed.  HENT:     Head: Normocephalic and atraumatic.  Eyes:     Conjunctiva/sclera: Conjunctivae normal.  Cardiovascular:     Rate and Rhythm: Normal rate and regular rhythm.     Heart sounds: No murmur heard. Pulmonary:     Effort: Accessory muscle usage present. No respiratory distress.     Breath sounds: Wheezing and rhonchi present.  Abdominal:     Palpations: Abdomen is soft.      Tenderness: There is no abdominal tenderness. There is no guarding or rebound.  Musculoskeletal:     Cervical back: Neck supple.     Right lower leg: No edema.     Left lower leg: No edema.  Skin:    General: Skin is warm and dry.  Neurological:     General: No focal deficit present.     Mental Status: He is alert.     GCS: GCS eye subscore is 4. GCS verbal subscore is 5. GCS motor subscore is 6.     (all labs ordered are listed, but only abnormal results are displayed) Labs Reviewed  RESP PANEL BY RT-PCR (RSV, FLU A&B, COVID)  [id number]  CBC  BASIC METABOLIC PANEL WITH GFR    EKG: None  Radiology: DG Chest 2 View Result Date: 12/04/2023 CLINICAL DATA:  Shortness of breath EXAM: CHEST - 2 VIEW COMPARISON:  10/19/2020, PET CT 08/30/2023 FINDINGS: Right-sided central venous port tip at the SVC. No acute airspace disease, pleural effusion or pneumothorax. Stable cardiomediastinal silhouette. Chronic sclerotic and expansile right seventh rib lesion. IMPRESSION: No active cardiopulmonary disease. Chronic sclerotic and expansile right seventh rib lesion. Electronically Signed   By: Luke Bun M.D.   On: 12/04/2023 19:29    {Document cardiac monitor, telemetry assessment procedure when appropriate:32947} Procedures   Medications Ordered in the ED  ipratropium-albuterol (DUONEB) 0.5-2.5 (3) MG/3ML nebulizer solution 3 mL (has no administration in time range)    Clinical Course as of 12/04/23 1940  Wed Dec 04, 2023  1915 ECG not crossing in epic.  Sinus rhythm no acute ST-T's.  Similar to prior. [MB]  1923 Chest x-ray without clear infiltrate.  Awaiting radiology reading. [MB]    Clinical Course User Index [MB] Towana Ozell BROCKS, MD   {Click here for ABCD2, HEART and other calculators REFRESH Note before signing:1}                              Medical Decision Making Amount and/or Complexity of Data Reviewed Labs: ordered. Radiology: ordered.  Risk Prescription drug  management.   This patient complains of ***; this involves an extensive number of treatment Options and is a complaint that carries with it a high risk of complications and morbidity. The differential includes ***  I ordered, reviewed and interpreted labs, which included *** I ordered medication *** and reviewed PMP when indicated. I ordered imaging studies which included *** and I independently    visualized and interpreted imaging which showed *** Additional history obtained from *** Previous records obtained and reviewed *** I consulted *** and discussed lab and imaging findings and discussed disposition.  Cardiac monitoring reviewed, ***  Social determinants considered, *** Critical Interventions: ***  After the interventions stated above, I reevaluated the patient and found *** Admission and further testing considered, ***   {Document critical care time when appropriate  Document review of labs and clinical decision tools ie CHADS2VASC2, etc  Document your independent review of radiology images and any outside records  Document your discussion with family members, caretakers and with consultants  Document social determinants of health affecting pt's care  Document your decision making why or why not admission, treatments were needed:32947:::1}   Final diagnoses:  None    ED Discharge Orders     None

## 2023-12-05 ENCOUNTER — Ambulatory Visit: Payer: 59 | Admitting: Medical

## 2023-12-08 ENCOUNTER — Other Ambulatory Visit: Payer: Self-pay | Admitting: Medical

## 2023-12-08 DIAGNOSIS — J0100 Acute maxillary sinusitis, unspecified: Secondary | ICD-10-CM

## 2023-12-09 ENCOUNTER — Ambulatory Visit (INDEPENDENT_AMBULATORY_CARE_PROVIDER_SITE_OTHER): Payer: 59

## 2023-12-20 ENCOUNTER — Encounter (HOSPITAL_COMMUNITY)
Admission: RE | Admit: 2023-12-20 | Discharge: 2023-12-20 | Disposition: A | Discharge status: Home / Self Care | Payer: 59 | Source: Ambulatory Visit | Attending: Hematology & Oncology | Admitting: Hematology & Oncology

## 2023-12-20 DIAGNOSIS — C642 Malignant neoplasm of left kidney, except renal pelvis: Secondary | ICD-10-CM | POA: Insufficient documentation

## 2023-12-20 LAB — GLUCOSE, CAPILLARY: Glucose-Capillary: 137 mg/dL — ABNORMAL HIGH (ref 70–99)

## 2023-12-20 MED ORDER — FLUDEOXYGLUCOSE F - 18 (FDG) INJECTION
10.0000 | Freq: Once | INTRAVENOUS | Status: AC
  Administered 2023-12-20: 10.4 via INTRAVENOUS

## 2023-12-23 ENCOUNTER — Ambulatory Visit: Payer: Self-pay | Admitting: Hematology & Oncology

## 2024-01-07 ENCOUNTER — Other Ambulatory Visit (HOSPITAL_BASED_OUTPATIENT_CLINIC_OR_DEPARTMENT_OTHER): Payer: Self-pay

## 2024-01-07 ENCOUNTER — Inpatient Hospital Stay: Payer: 59 | Attending: Hematology & Oncology

## 2024-01-07 ENCOUNTER — Inpatient Hospital Stay: Payer: 59

## 2024-01-07 ENCOUNTER — Inpatient Hospital Stay: Payer: 59 | Admitting: Medical Oncology

## 2024-01-07 ENCOUNTER — Inpatient Hospital Stay: Payer: 59 | Admitting: Hematology & Oncology

## 2024-01-07 ENCOUNTER — Encounter: Payer: Self-pay | Admitting: Hematology & Oncology

## 2024-01-07 ENCOUNTER — Other Ambulatory Visit: Payer: Self-pay | Admitting: Hematology & Oncology

## 2024-01-07 VITALS — BP 142/67 | HR 87 | Temp 98.3°F | Resp 16 | Wt 208.0 lb

## 2024-01-07 DIAGNOSIS — C7951 Secondary malignant neoplasm of bone: Secondary | ICD-10-CM | POA: Insufficient documentation

## 2024-01-07 DIAGNOSIS — C642 Malignant neoplasm of left kidney, except renal pelvis: Secondary | ICD-10-CM

## 2024-01-07 DIAGNOSIS — M25551 Pain in right hip: Secondary | ICD-10-CM | POA: Insufficient documentation

## 2024-01-07 DIAGNOSIS — Z5112 Encounter for antineoplastic immunotherapy: Secondary | ICD-10-CM | POA: Insufficient documentation

## 2024-01-07 DIAGNOSIS — Z9189 Other specified personal risk factors, not elsewhere classified: Secondary | ICD-10-CM

## 2024-01-07 DIAGNOSIS — Z7982 Long term (current) use of aspirin: Secondary | ICD-10-CM | POA: Diagnosis not present

## 2024-01-07 DIAGNOSIS — Z79899 Other long term (current) drug therapy: Secondary | ICD-10-CM | POA: Diagnosis not present

## 2024-01-07 LAB — CMP (CANCER CENTER ONLY)
ALT: 17 U/L (ref 0–44)
AST: 19 U/L (ref 15–41)
Albumin: 4.2 g/dL (ref 3.5–5.0)
Alkaline Phosphatase: 119 U/L (ref 38–126)
Anion gap: 12 (ref 5–15)
BUN: 14 mg/dL (ref 6–20)
CO2: 23 mmol/L (ref 22–32)
Calcium: 9.2 mg/dL (ref 8.9–10.3)
Chloride: 101 mmol/L (ref 98–111)
Creatinine: 0.99 mg/dL (ref 0.61–1.24)
GFR, Estimated: >60 mL/min (ref >60)
Glucose, Bld: 140 mg/dL — ABNORMAL HIGH (ref 70–99)
Potassium: 4.2 mmol/L (ref 3.5–5.1)
Sodium: 137 mmol/L (ref 135–145)
Total Bilirubin: 0.3 mg/dL (ref 0.0–1.2)
Total Protein: 6.9 g/dL (ref 6.5–8.1)

## 2024-01-07 LAB — CBC WITH DIFFERENTIAL (CANCER CENTER ONLY)
Abs Immature Granulocytes: 0.04 K/uL (ref 0.00–0.07)
Basophils Absolute: 0.1 K/uL (ref 0.0–0.1)
Basophils Relative: 1 %
Eosinophils Absolute: 0.2 K/uL (ref 0.0–0.5)
Eosinophils Relative: 3 %
HCT: 44.4 % (ref 39.0–52.0)
Hemoglobin: 15 g/dL (ref 13.0–17.0)
Immature Granulocytes: 1 %
Lymphocytes Relative: 20 %
Lymphs Abs: 1.4 K/uL (ref 0.7–4.0)
MCH: 30.7 pg (ref 26.0–34.0)
MCHC: 33.8 g/dL (ref 30.0–36.0)
MCV: 90.8 fL (ref 80.0–100.0)
Monocytes Absolute: 0.4 K/uL (ref 0.1–1.0)
Monocytes Relative: 6 %
Neutro Abs: 4.9 K/uL (ref 1.7–7.7)
Neutrophils Relative %: 69 %
Platelet Count: 174 K/uL (ref 150–400)
RBC: 4.89 MIL/uL (ref 4.22–5.81)
RDW: 13.7 % (ref 11.5–15.5)
WBC Count: 7 K/uL (ref 4.0–10.5)
nRBC: 0 % (ref 0.0–0.2)

## 2024-01-07 LAB — TSH: TSH: 1.65 u[IU]/mL (ref 0.350–4.500)

## 2024-01-07 LAB — LACTATE DEHYDROGENASE: LDH: 149 U/L (ref 105–235)

## 2024-01-07 MED ORDER — DENOSUMAB 120 MG/1.7ML ~~LOC~~ SOLN
120.0000 mg | Freq: Once | SUBCUTANEOUS | Status: AC
  Administered 2024-01-07: 120 mg via SUBCUTANEOUS
  Filled 2024-01-07: qty 1.7

## 2024-01-07 MED ORDER — OXYCODONE HCL ER 15 MG PO T12A
15.0000 mg | EXTENDED_RELEASE_TABLET | Freq: Two times a day (BID) | ORAL | 0 refills | Status: AC
  Filled 2024-01-07: qty 34, 17d supply, fill #0

## 2024-01-07 MED ORDER — OXYCODONE HCL 10 MG PO TABS
10.0000 mg | ORAL_TABLET | ORAL | 0 refills | Status: DC | PRN
  Filled 2024-01-07: qty 90, 15d supply, fill #0

## 2024-01-07 MED ORDER — SODIUM CHLORIDE 0.9 % IV SOLN
480.0000 mg | Freq: Once | INTRAVENOUS | Status: AC
  Administered 2024-01-07: 480 mg via INTRAVENOUS
  Filled 2024-01-07: qty 48

## 2024-01-07 MED ORDER — SODIUM CHLORIDE 0.9 % IV SOLN: Freq: Once | INTRAVENOUS | Status: AC

## 2024-01-07 NOTE — Progress Notes (Signed)
 Hematology and Oncology Follow Up Visit  Raja Liska [id number] [birth year] 58 y.o. 01/07/2024   Principle Diagnosis:  Metastatic Renal Cell Carcinoma - bone mets  Current Therapy:   Nivoumab/Ipilimumab  -- s/p cycle #5 -- started on 09/29/2019 -- maintenance Nivolumab  q 2 month -- Started on 12/29/2019 Xgeva  120 mg sq q 2 months -- Oct dose skipped due to nasal surgery.  XRT to right iliac mass/ L5 vertebral body/ Left femoral neck LEFT hip repair Cryoablation of left kidney mass-10/19/2020     Interim History:  Mr. Klinke is back for follow-up.    He reports that he is doing well.   He had sinus surgery on October 27 which went well.   He continues to have pain over on the right hip area.  In the past he has seen IR for this with injections which were helpful. He asks to be referred to them again.   There is no problems with bowels or bladder.  He has had no problems with nausea or vomiting.  He has had no cough or shortness of breath.  There is been no leg pain.  He has had no leg swelling.  His last TSH was 1.960 in October   Overall, I will have to say that his performance status is probably ECOG 0.  Wt Readings from Last 3 Encounters:  01/07/24 208 lb (94.3 kg)  12/04/23 207 lb (93.9 kg)  11/12/23 208 lb (94.3 kg)     Medications:  Current Outpatient Medications:    amLODipine  (NORVASC ) 5 MG tablet, Take 1 tablet (5 mg total) by mouth daily., Disp: 90 tablet, Rfl: 5   aspirin  EC 81 MG tablet, Take 1 tablet (81 mg total) by mouth 2 (two) times daily., Disp: 60 tablet, Rfl: 0   Azelastine  HCl 137 MCG/SPRAY SOLN, PLACE 1 SPRAY INTO BOTH NOSTRILS EVERY EVENING. NEEDS APPT, Disp: 30 mL, Rfl: 2   cloBAZam  (ONFI ) 10 MG tablet, TAKE 1 TABLET BY MOUTH EVERYDAY AT BEDTIME, Disp: 30 tablet, Rfl: 5   fluticasone  (FLONASE ) 50 MCG/ACT nasal spray, PLACE 2 SPRAYS INTO BOTH NOSTRILS AT BEDTIME. NEEDS APPT, Disp: 16 mL, Rfl: 0   hydrocortisone -nystatin -diphenhydrAMINE , Rinse with 1  tablespoonful (15 mL) 3 to 4 times daily as directed., Disp: 480 mL, Rfl: 3   ibuprofen  (ADVIL ) 200 MG tablet, Take 400 mg by mouth every 8 (eight) hours as needed (pain.)., Disp: , Rfl:    lamoTRIgine  (LAMICTAL ) 100 MG tablet, TAKE 2 TABLETS BY MOUTH 2 TIMES DAILY., Disp: 360 tablet, Rfl: 3   lamoTRIgine  (LAMICTAL ) 25 MG tablet, TAKE 2 TABLETS BY MOUTH 2 TIMES DAILY., Disp: 360 tablet, Rfl: 3   oxyCODONE  (OXYCONTIN ) 15 mg 12 hr tablet, Take 1 tablet (15 mg total) by mouth every 12 (twelve) hours., Disp: 34 tablet, Rfl: 0   Oxycodone  HCl 10 MG TABS, Take 1 tablet (10 mg total) by mouth every 4 (four) hours as needed., Disp: 90 tablet, Rfl: 0   SUMAtriptan  (IMITREX ) 50 MG tablet, Take 50 mg by mouth every 2 (two) hours as needed for migraine. May repeat in 2 hours if headache persists or recurs., Disp: , Rfl:  No current facility-administered medications for this visit.  Facility-Administered Medications Ordered in Other Visits:    sodium chloride  flush (NS) 0.9 % injection 10 mL, 10 mL, Intracatheter, PRN, Ennever, Peter R, MD, 10 mL at 05/19/20 1343   sodium chloride  flush (NS) 0.9 % injection 10 mL, 10 mL, Intravenous, PRN, Ennever, Peter R, MD, 10 mL  at 08/23/22 1610  Allergies: No Known Allergies  Past Medical History, Surgical history, Social history, and Family History were reviewed and updated.  Review of Systems: Review of Systems  Constitutional: Negative.   HENT:  Negative.    Eyes: Negative.   Respiratory: Negative.    Cardiovascular: Negative.   Gastrointestinal: Negative.   Endocrine: Negative.   Musculoskeletal:  Positive for back pain and flank pain.  Skin: Negative.   Neurological: Negative.   Hematological: Negative.   Psychiatric/Behavioral: Negative.      Physical Exam:  His vital signs show temperature of 98.  Pulse 76.  Blood pressure 140/69.  Weight is 208 pounds.  Wt Readings from Last 3 Encounters:  01/07/24 208 lb (94.3 kg)  12/04/23 207 lb (93.9 kg)   11/12/23 208 lb (94.3 kg)    Physical Exam Vitals reviewed.  HENT:     Head: Normocephalic and atraumatic.  Eyes:     Pupils: Pupils are equal, round, and reactive to light.  Cardiovascular:     Rate and Rhythm: Normal rate and regular rhythm.     Heart sounds: Normal heart sounds.  Pulmonary:     Effort: Pulmonary effort is normal.     Breath sounds: Normal breath sounds.  Abdominal:     General: Bowel sounds are normal.     Palpations: Abdomen is soft.  Musculoskeletal:        General: No tenderness or deformity. Normal range of motion.     Cervical back: Normal range of motion.  Lymphadenopathy:     Cervical: No cervical adenopathy.  Skin:    General: Skin is warm and dry.     Findings: No erythema or rash.  Neurological:     Mental Status: He is alert and oriented to person, place, and time.  Psychiatric:        Behavior: Behavior normal.        Thought Content: Thought content normal.        Judgment: Judgment normal.      Lab Results  Component Value Date   WBC 7.0 01/07/2024   HGB 15.0 01/07/2024   HCT 44.4 01/07/2024   MCV 90.8 01/07/2024   PLT 174 01/07/2024     Chemistry      Component Value Date/Time   NA 137 12/04/2023 1911   NA 137 12/04/2022 1132   K 4.3 12/04/2023 1911   CL 99 12/04/2023 1911   CO2 25 12/04/2023 1911   BUN 14 12/04/2023 1911   BUN 15 12/04/2022 1132   CREATININE 1.06 12/04/2023 1911   CREATININE 1.05 11/12/2023 0825   CREATININE 1.15 08/25/2019 1450      Component Value Date/Time   CALCIUM 9.6 12/04/2023 1911   ALKPHOS 122 11/12/2023 0825   AST 20 11/12/2023 0825   ALT 17 11/12/2023 0825   BILITOT 0.4 11/12/2023 0825     Encounter Diagnoses  Name Primary?   Impending pathologic fracture Yes   Renal cell cancer, left (HCC)    Kidney cancer, primary, with metastasis from kidney to other site, left [facility])     Impression and Plan: Mr. Thorpe is a very nice 58 year old white male.  He has metastatic renal cell  carcinoma.  For some reason, his disease clearly favors his bones.  His lungs and liver and lymph nodes all appear to be clean. He is on maintenance Nivolumab  which he tolerated well.   November PET showed disease stability. Xgeva  was held due to upcoming nasal surgery however this  is being rescheduled for a later date as he was sick with the flu.    CBC and CMP reviewed and acceptable for treatment today  Glucose elevated however he reports just having had lunch.  Xgeva  and Nivolumab  today.  IR referral placed  RTC 2 months MD/APP, labs(CBC w/, CMP, LDH, TSH, FreeT4), injection/infusion    12/2/202512:12 PM

## 2024-01-07 NOTE — Patient Instructions (Signed)

## 2024-01-07 NOTE — Patient Instructions (Signed)
 [facility] - A DEPT OF [facility]  Discharge Instructions: Thank you for choosing [facility] to provide your oncology and hematology care.   If you have a lab appointment with the Cancer Center, please go directly to the Cancer Center and check in at the registration area.  Wear comfortable clothing and clothing appropriate for easy access to any Portacath or PICC line.   We strive to give you quality time with your provider. You may need to reschedule your appointment if you arrive late (15 or more minutes).  Arriving late affects you and other patients whose appointments are after yours.  Also, if you miss three or more appointments without notifying the office, you may be dismissed from the clinic at the provider's discretion.      For prescription refill requests, have your pharmacy contact our office and allow 72 hours for refills to be completed.    Today you received the following chemotherapy and/or immunotherapy agents Opdivo       To help prevent nausea and vomiting after your treatment, we encourage you to take your nausea medication as directed.  BELOW ARE SYMPTOMS THAT SHOULD BE REPORTED IMMEDIATELY: *FEVER GREATER THAN 100.4 F (38 C) OR HIGHER *CHILLS OR SWEATING *NAUSEA AND VOMITING THAT IS NOT CONTROLLED WITH YOUR NAUSEA MEDICATION *UNUSUAL SHORTNESS OF BREATH *UNUSUAL BRUISING OR BLEEDING *URINARY PROBLEMS (pain or burning when urinating, or frequent urination) *BOWEL PROBLEMS (unusual diarrhea, constipation, pain near the anus) TENDERNESS IN MOUTH AND THROAT WITH OR WITHOUT PRESENCE OF ULCERS (sore throat, sores in mouth, or a toothache) UNUSUAL RASH, SWELLING OR PAIN  UNUSUAL VAGINAL DISCHARGE OR ITCHING   Items with * indicate a potential emergency and should be followed up as soon as possible or go to the Emergency Department if any problems should occur.  Please show the CHEMOTHERAPY ALERT CARD or IMMUNOTHERAPY  ALERT CARD at check-in to the Emergency Department and triage nurse. Should you have questions after your visit or need to cancel or reschedule your appointment, please contact [facility] - A DEPT OF [facility]  [phone] and follow the prompts.  Office hours are 8:00 a.m. to 4:30 p.m. Monday - Friday. Please note that voicemails left after 4:00 p.m. may not be returned until the following business day.  We are closed weekends and major holidays. You have access to a nurse at all times for urgent questions. Please call the main number to the clinic [phone] and follow the prompts.  For any non-urgent questions, you may also contact your provider using MyChart. We now offer e-Visits for anyone 77 and older to request care online for non-urgent symptoms. For details visit [email or web address].   Also download the MyChart app! Go to the app store, search "MyChart", open the app, select [facility], and log in with your MyChart username and password.

## 2024-01-08 ENCOUNTER — Other Ambulatory Visit (HOSPITAL_BASED_OUTPATIENT_CLINIC_OR_DEPARTMENT_OTHER): Payer: Self-pay

## 2024-01-08 LAB — T4: T4, Total: 5.5 ug/dL (ref 4.5–12.0)

## 2024-01-09 ENCOUNTER — Other Ambulatory Visit: Payer: Self-pay | Admitting: Medical Oncology

## 2024-01-09 ENCOUNTER — Other Ambulatory Visit (HOSPITAL_BASED_OUTPATIENT_CLINIC_OR_DEPARTMENT_OTHER): Payer: Self-pay

## 2024-01-09 DIAGNOSIS — Z9189 Other specified personal risk factors, not elsewhere classified: Secondary | ICD-10-CM

## 2024-01-09 DIAGNOSIS — C642 Malignant neoplasm of left kidney, except renal pelvis: Secondary | ICD-10-CM

## 2024-01-10 ENCOUNTER — Other Ambulatory Visit (HOSPITAL_BASED_OUTPATIENT_CLINIC_OR_DEPARTMENT_OTHER): Payer: Self-pay

## 2024-01-14 ENCOUNTER — Telehealth: Payer: Self-pay

## 2024-01-14 NOTE — Telephone Encounter (Signed)
 Called IR at [facility] to f/u on patient's referral.Speke with jennifer who confirmed that they received the referral. They will contact the patient to schedule up an appointment.

## 2024-01-27 NOTE — Discharge Instructions (Signed)

## 2024-01-28 ENCOUNTER — Ambulatory Visit
Admission: RE | Admit: 2024-01-28 | Discharge: 2024-01-28 | Disposition: A | Discharge status: Home / Self Care | Payer: 59 | Source: Ambulatory Visit | Attending: Medical Oncology | Admitting: Medical Oncology

## 2024-01-28 ENCOUNTER — Encounter: Payer: Self-pay | Admitting: Hematology & Oncology

## 2024-01-28 DIAGNOSIS — Z9189 Other specified personal risk factors, not elsewhere classified: Secondary | ICD-10-CM

## 2024-01-28 DIAGNOSIS — C642 Malignant neoplasm of left kidney, except renal pelvis: Secondary | ICD-10-CM

## 2024-01-28 MED ORDER — METHYLPREDNISOLONE ACETATE 40 MG/ML INJ SUSP (RADIOLOG
80.0000 mg | Freq: Once | INTRAMUSCULAR | Status: AC
  Administered 2024-01-28: 80 mg via EPIDURAL

## 2024-01-28 MED ORDER — IOPAMIDOL (ISOVUE-M 200) INJECTION 41%
1.0000 mL | Freq: Once | INTRAMUSCULAR | Status: AC
  Administered 2024-01-28: 1 mL via EPIDURAL

## 2024-02-21 ENCOUNTER — Encounter: Payer: Self-pay | Admitting: Adult Health

## 2024-02-21 DIAGNOSIS — G4733 Obstructive sleep apnea (adult) (pediatric): Secondary | ICD-10-CM

## 2024-03-03 NOTE — Telephone Encounter (Signed)
LVM for patient to call back and discuss mychart message.

## 2024-03-05 NOTE — Telephone Encounter (Signed)
 Pt has received his CPAP, he has asked to speak with CMA re: settings, call connected to [facility]

## 2024-03-05 NOTE — Addendum Note (Signed)
 Addended by: SHONA SAVANT A on: 03/05/2024 08:49 AM   Modules accepted: Orders

## 2024-03-05 NOTE — Telephone Encounter (Signed)
 Spoke to patient received replacement cpap machine from [facility] Pt states will take cpap to [facility] this afternoon so they transfer settings to new pap machine . Scheduled initial cpap f/u March 2026 with Megan,NP. Sent order for pressure settings to [facility] this am . Pt thanked me for calling

## 2024-03-07 ENCOUNTER — Other Ambulatory Visit: Payer: Self-pay | Admitting: Hematology & Oncology

## 2024-03-09 ENCOUNTER — Inpatient Hospital Stay: Payer: 59

## 2024-03-09 ENCOUNTER — Inpatient Hospital Stay: Payer: 59 | Admitting: Medical Oncology

## 2024-03-09 ENCOUNTER — Other Ambulatory Visit (HOSPITAL_BASED_OUTPATIENT_CLINIC_OR_DEPARTMENT_OTHER): Payer: Self-pay

## 2024-03-09 MED ORDER — OXYCODONE HCL 10 MG PO TABS
10.0000 mg | ORAL_TABLET | ORAL | 0 refills | Status: AC | PRN
  Filled 2024-03-09: qty 90, 15d supply, fill #0

## 2024-03-10 ENCOUNTER — Other Ambulatory Visit (HOSPITAL_BASED_OUTPATIENT_CLINIC_OR_DEPARTMENT_OTHER): Payer: Self-pay

## 2024-03-12 ENCOUNTER — Ambulatory Visit: Payer: Self-pay | Admitting: Medical Oncology

## 2024-03-12 ENCOUNTER — Inpatient Hospital Stay

## 2024-03-12 ENCOUNTER — Inpatient Hospital Stay: Attending: Hematology & Oncology

## 2024-03-12 ENCOUNTER — Encounter: Payer: Self-pay | Admitting: Medical Oncology

## 2024-03-12 ENCOUNTER — Inpatient Hospital Stay: Admitting: Medical Oncology

## 2024-03-12 ENCOUNTER — Other Ambulatory Visit: Payer: Self-pay

## 2024-03-12 ENCOUNTER — Other Ambulatory Visit (HOSPITAL_BASED_OUTPATIENT_CLINIC_OR_DEPARTMENT_OTHER): Payer: Self-pay

## 2024-03-12 VITALS — BP 130/60 | HR 79 | Temp 97.8°F | Resp 16 | Ht 67.0 in | Wt 214.0 lb

## 2024-03-12 DIAGNOSIS — C642 Malignant neoplasm of left kidney, except renal pelvis: Secondary | ICD-10-CM

## 2024-03-12 DIAGNOSIS — Z9189 Other specified personal risk factors, not elsewhere classified: Secondary | ICD-10-CM

## 2024-03-12 LAB — CBC WITH DIFFERENTIAL (CANCER CENTER ONLY)
Abs Immature Granulocytes: 0.02 10*3/uL (ref 0.00–0.07)
Basophils Absolute: 0 10*3/uL (ref 0.0–0.1)
Basophils Relative: 1 %
Eosinophils Absolute: 0.4 10*3/uL (ref 0.0–0.5)
Eosinophils Relative: 7 %
HCT: 42.4 % (ref 39.0–52.0)
Hemoglobin: 14.7 g/dL (ref 13.0–17.0)
Immature Granulocytes: 0 %
Lymphocytes Relative: 21 %
Lymphs Abs: 1.3 10*3/uL (ref 0.7–4.0)
MCH: 30.9 pg (ref 26.0–34.0)
MCHC: 34.7 g/dL (ref 30.0–36.0)
MCV: 89.1 fL (ref 80.0–100.0)
Monocytes Absolute: 0.4 10*3/uL (ref 0.1–1.0)
Monocytes Relative: 6 %
Neutro Abs: 4.2 10*3/uL (ref 1.7–7.7)
Neutrophils Relative %: 65 %
Platelet Count: 194 10*3/uL (ref 150–400)
RBC: 4.76 MIL/uL (ref 4.22–5.81)
RDW: 12.9 % (ref 11.5–15.5)
WBC Count: 6.3 10*3/uL (ref 4.0–10.5)
nRBC: 0 % (ref 0.0–0.2)

## 2024-03-12 LAB — CMP (CANCER CENTER ONLY)
ALT: 18 U/L (ref 0–44)
AST: 21 U/L (ref 15–41)
Albumin: 4.3 g/dL (ref 3.5–5.0)
Alkaline Phosphatase: 122 U/L (ref 38–126)
Anion gap: 10 (ref 5–15)
BUN: 20 mg/dL (ref 6–20)
CO2: 26 mmol/L (ref 22–32)
Calcium: 8.8 mg/dL — ABNORMAL LOW (ref 8.9–10.3)
Chloride: 102 mmol/L (ref 98–111)
Creatinine: 1.01 mg/dL (ref 0.61–1.24)
GFR, Estimated: >60 mL/min
Glucose, Bld: 159 mg/dL — ABNORMAL HIGH (ref 70–99)
Potassium: 4.3 mmol/L (ref 3.5–5.1)
Sodium: 138 mmol/L (ref 135–145)
Total Bilirubin: 0.4 mg/dL (ref 0.0–1.2)
Total Protein: 6.6 g/dL (ref 6.5–8.1)

## 2024-03-12 LAB — LACTATE DEHYDROGENASE: LDH: 159 U/L (ref 105–235)

## 2024-03-12 LAB — T4, FREE: Free T4: 0.99 ng/dL (ref 0.80–2.00)

## 2024-03-12 LAB — TSH: TSH: 2.2 u[IU]/mL (ref 0.350–4.500)

## 2024-03-12 MED ORDER — SODIUM CHLORIDE 0.9 % IV SOLN
480.0000 mg | Freq: Once | INTRAVENOUS | Status: AC
  Administered 2024-03-12: 480 mg via INTRAVENOUS
  Filled 2024-03-12: qty 48

## 2024-03-12 MED ORDER — SODIUM CHLORIDE 0.9 % IV SOLN: Freq: Once | INTRAVENOUS | Status: DC

## 2024-03-12 NOTE — Progress Notes (Signed)
 " Hematology and Oncology Follow Up Visit  Vincent David [id number] [birth year] 59 y.o. 03/12/2024   Principle Diagnosis:  Metastatic Renal Cell Carcinoma - bone mets  Current Therapy:   Nivoumab/Ipilimumab  -- s/p cycle #5 -- started on 09/29/2019 -- maintenance Nivolumab  q 2 month -- Started on 12/29/2019 Xgeva  120 mg sq q 2 months -- Oct dose skipped due to nasal surgery. Feb dose skipped due to jaw pain XRT to right iliac mass/ L5 vertebral body/ Left femoral neck LEFT hip repair Cryoablation of left kidney mass-10/19/2020     Interim History:  Vincent David is back for follow-up.    He reports that he is doing well. His only concern when asked is that he is having some left lower jaw pain. He saw his dentist for this in Dec and had x rays and no concerns for osteonecrosis of the jaw however pain has changed and is more prevalent so he is going to call today to be seen again.   He had sinus surgery on October 27 which went well.   IR has helped with his right back pain. He still has some left back pain.   There is no problems with bowels or bladder.  He has had no problems with nausea or vomiting.  He has had no cough or shortness of breath.  There is been no leg pain.  He has had no leg swelling.  His last TSH was 1.650 in Dec  Overall, I will have to say that his performance status is probably ECOG 0.  Wt Readings from Last 3 Encounters:  03/12/24 214 lb (97.1 kg)  01/07/24 208 lb (94.3 kg)  12/04/23 207 lb (93.9 kg)     Medications:  Current Outpatient Medications:    amLODipine  (NORVASC ) 5 MG tablet, Take 1 tablet (5 mg total) by mouth daily., Disp: 90 tablet, Rfl: 5   aspirin  EC 81 MG tablet, Take 1 tablet (81 mg total) by mouth 2 (two) times daily., Disp: 60 tablet, Rfl: 0   Azelastine  HCl 137 MCG/SPRAY SOLN, PLACE 1 SPRAY INTO BOTH NOSTRILS EVERY EVENING. NEEDS APPT, Disp: 30 mL, Rfl: 2   cloBAZam  (ONFI ) 10 MG tablet, TAKE 1 TABLET BY MOUTH EVERYDAY AT BEDTIME, Disp: 30  tablet, Rfl: 5   fluticasone  (FLONASE ) 50 MCG/ACT nasal spray, PLACE 2 SPRAYS INTO BOTH NOSTRILS AT BEDTIME. NEEDS APPT, Disp: 16 mL, Rfl: 0   hydrocortisone -nystatin -diphenhydrAMINE , Rinse with 1 tablespoonful (15 mL) 3 to 4 times daily as directed., Disp: 480 mL, Rfl: 3   ibuprofen  (ADVIL ) 200 MG tablet, Take 400 mg by mouth every 8 (eight) hours as needed (pain.)., Disp: , Rfl:    lamoTRIgine  (LAMICTAL ) 100 MG tablet, TAKE 2 TABLETS BY MOUTH 2 TIMES DAILY., Disp: 360 tablet, Rfl: 3   lamoTRIgine  (LAMICTAL ) 25 MG tablet, TAKE 2 TABLETS BY MOUTH 2 TIMES DAILY., Disp: 360 tablet, Rfl: 3   oxyCODONE  (OXYCONTIN ) 15 mg 12 hr tablet, Take 1 tablet (15 mg total) by mouth every 12 (twelve) hours., Disp: 34 tablet, Rfl: 0   Oxycodone  HCl 10 MG TABS, Take 1 tablet (10 mg total) by mouth every 4 (four) hours as needed., Disp: 90 tablet, Rfl: 0   SUMAtriptan  (IMITREX ) 50 MG tablet, Take 50 mg by mouth every 2 (two) hours as needed for migraine. May repeat in 2 hours if headache persists or recurs., Disp: , Rfl:  No current facility-administered medications for this visit.  Facility-Administered Medications Ordered in Other Visits:    sodium chloride   flush (NS) 0.9 % injection 10 mL, 10 mL, Intracatheter, PRN, Ennever, Peter R, MD, 10 mL at 05/19/20 1343   sodium chloride  flush (NS) 0.9 % injection 10 mL, 10 mL, Intravenous, PRN, Ennever, Peter R, MD, 10 mL at 08/23/22 1610  Allergies: No Known Allergies  Past Medical History, Surgical history, Social history, and Family History were reviewed and updated.  Review of Systems: Review of Systems  Constitutional: Negative.   HENT:  Negative.    Eyes: Negative.   Respiratory: Negative.    Cardiovascular: Negative.   Gastrointestinal: Negative.   Endocrine: Negative.   Musculoskeletal:  Positive for back pain and flank pain.  Skin: Negative.   Neurological: Negative.   Hematological: Negative.   Psychiatric/Behavioral: Negative.      Physical  Exam:  Vitals:   03/12/24 0900  BP: 130/60  Pulse: 79  Resp: 16  Temp: 97.8 F (36.6 C)  SpO2: 97%     Wt Readings from Last 3 Encounters:  03/12/24 214 lb (97.1 kg)  01/07/24 208 lb (94.3 kg)  12/04/23 207 lb (93.9 kg)    Physical Exam Vitals reviewed.  HENT:     Head: Normocephalic and atraumatic.  Eyes:     Pupils: Pupils are equal, round, and reactive to light.  Cardiovascular:     Rate and Rhythm: Normal rate and regular rhythm.     Heart sounds: Normal heart sounds.  Pulmonary:     Effort: Pulmonary effort is normal.     Breath sounds: Normal breath sounds.  Abdominal:     General: Bowel sounds are normal.     Palpations: Abdomen is soft.  Musculoskeletal:        General: No tenderness or deformity. Normal range of motion.     Cervical back: Normal range of motion.  Lymphadenopathy:     Cervical: No cervical adenopathy.  Skin:    General: Skin is warm and dry.     Findings: No erythema or rash.  Neurological:     Mental Status: He is alert and oriented to person, place, and time.  Psychiatric:        Behavior: Behavior normal.        Thought Content: Thought content normal.        Judgment: Judgment normal.      Lab Results  Component Value Date   WBC 6.3 03/12/2024   HGB 14.7 03/12/2024   HCT 42.4 03/12/2024   MCV 89.1 03/12/2024   PLT 194 03/12/2024     Chemistry      Component Value Date/Time   NA 137 01/07/2024 1105   NA 137 12/04/2022 1132   K 4.2 01/07/2024 1105   CL 101 01/07/2024 1105   CO2 23 01/07/2024 1105   BUN 14 01/07/2024 1105   BUN 15 12/04/2022 1132   CREATININE 0.99 01/07/2024 1105   CREATININE 1.15 08/25/2019 1450      Component Value Date/Time   CALCIUM 9.2 01/07/2024 1105   ALKPHOS 119 01/07/2024 1105   AST 19 01/07/2024 1105   ALT 17 01/07/2024 1105   BILITOT 0.3 01/07/2024 1105     Encounter Diagnoses  Name Primary?   Kidney cancer, primary, with metastasis from kidney to other site, left [facility]) Yes    Impending pathologic fracture     Impression and Plan: Vincent David is a very nice 59 year old white male.  He has metastatic renal cell carcinoma.  For some reason, his disease clearly favors his bones.  His lungs and liver  and lymph nodes all appear to be clean. He is on maintenance Nivolumab  which he tolerated well.   November PET showed disease stability.    CBC and CMP reviewed and acceptable for treatment today   Nivolumab  today. HOLDING Xgeva  given jaw pain. He will call his dentist today for evaluation.    RTC 2 months MD/APP, labs(CBC w/, CMP, LDH, TSH, FreeT4), injection/infusion    2/5/20269:25 AM "

## 2024-03-12 NOTE — Patient Instructions (Signed)
 Device With a Small Disc Placed for Long-Term IV Use (Implanted Port): Care at Home An implanted port is a device with a small disc that's put under your skin. In most cases, it's placed in your chest. It can be used to draw blood and send medicines and fluids quickly into your bloodstream. You may need a port to give you: IV medicines that would bother the small veins in your hands or arms. IV medicines for a long time, such as medicines to kill cancer cells (chemotherapy). IV liquid nutrition for a long time. An implanted port has 2 main parts: The portal. This is the round disc where the needle is put in. It may look like a small, raised area under your skin. The catheter. This is a soft tube that connects the port to a vein. You may be able to do more everyday tasks with a port than with other types of long-term IVs. How is my port accessed?  A numbing cream may be put on the skin over the port site. Your skin will be cleaned with a solution that kills germs. Your health care provider will gently pinch the port and put a needle in it. The port will be checked to make sure it's in the vein and working. If you need to get medicine all the time, the needle may be left in the port. Your provider will put a clear bandage over the needle site. The bandage and needle will need to be changed each week. What is flushing? Flushing helps keep the port working like it should. Flush your port as told. If your port is only used from time to time to give medicines or draw blood, you may need to flush it: Before and after medicines have been given. Before and after blood has been drawn. Every 4-6 weeks to keep it working well. If you get medicine through your port all the time, you may not need to flush it. Talk with your provider to learn more. How long will my port stay in? The port will stay in for as long as it's needed. When it's time for it to come out, you'll have a procedure to remove it. Follow  these instructions at home: Caring for your port and port site Flush your port as told. If you need to get an infusion of medicine over a few days, take care of your port as told. Make sure you: Take care of your port site. Wash your hands with soap and water  for at least 20 seconds before and after you change your bandage. If you can't use soap and water , use hand sanitizer. Put any used bandages or infusion bags in a plastic bag. Throw the bag in the trash. Keep the bandage that covers the needle clean and dry. Do not let it get wet. Do not use scissors or sharp objects near the tubes. Keep any tubes clamped, unless they're being used. Check the area around your port site every day for signs of infection. Check for: Redness, swelling, or pain. Fluid or blood. Warmth. Pus or a bad smell. Protect the skin around the port site. Avoid wearing bra straps that rub or irritate the site. Protect the skin around your port from seat belts. Place a soft pad over your chest if needed. Do not take baths, swim, or use a hot tub until you're told it's OK. Ask if you can shower. General instructions  Throw away any syringes in a container that's meant for sharp  items (sharps container). You can buy a sharps container from a pharmacy. You can also make one by using an empty, hard plastic bottle with a lid. Always carry a medical alert card or wear a medical alert bracelet. Ask what things are safe for you to do at home. Ask when you can go back to work or school. Where to find more information To learn more, go to: American Cancer Society at prombar.it. Click on the magnifying glass and type IV lines and ports. Find the link you need. American Society of Clinical Oncology at fabvets.de. Click on the magnifying glass and type getting chemo infusions. Find the link you need. Contact a health care provider if: You can't flush your port. You can't draw blood from the port. You have a fever or  chills. You have any signs of infection. You have swelling in your arm, neck, or shoulder. This information is not intended to replace advice given to you by your health care provider. Make sure you discuss any questions you have with your health care provider. Document Revised: 11/05/2023 Document Reviewed: 11/05/2023 Elsevier Patient Education  2025 [employer].

## 2024-03-12 NOTE — Addendum Note (Signed)
 Addended by: TIMMY MAUDE SAUNDERS on: 03/12/2024 09:49 AM   Modules accepted: Orders

## 2024-03-12 NOTE — Patient Instructions (Signed)
 Denosumab  Injection (Oncology) What is this medication? DENOSUMAB  (den oh SUE mab) prevents weakened bones caused by cancer. It may also be used to treat noncancerous bone tumors that cannot be removed by surgery. It can also be used to treat high calcium  levels in the blood caused by cancer. It works by blocking a protein that causes bones to break down quickly. This slows down the release of calcium  from bones, which lowers calcium  levels in your blood. It also makes your bones stronger and less likely to break (fracture). This medicine may be used for other purposes; ask your health care provider or pharmacist if you have questions. COMMON BRAND NAME(S): XGEVA  What should I tell my care team before I take this medication? They need to know if you have any of these conditions: Dental disease Having surgery or tooth extraction Infection Kidney disease Low levels of calcium  or vitamin D  in the blood Malnutrition On hemodialysis Skin conditions or sensitivity Thyroid  or parathyroid disease An unusual reaction to denosumab , other medications, foods, dyes, or preservatives Pregnant or trying to get pregnant Breast-feeding How should I use this medication? This medication is for injection under the skin. It is given by your care team in a hospital or clinic setting. A special MedGuide will be given to you before each treatment. Be sure to read this information carefully each time. Talk to your care team about the use of this medication in children. While it may be prescribed for children as young as 13 years for selected conditions, precautions do apply. Overdosage: If you think you have taken too much of this medicine contact a poison control center or emergency room at once. NOTE: This medicine is only for you. Do not share this medicine with others. What if I miss a dose? Keep appointments for follow-up doses. It is important not to miss your dose. Call your care team if you are unable to  keep an appointment. What may interact with this medication? Do not take this medication with any of the following: Other medications containing denosumab  This medication may also interact with the following: Medications that lower your chance of fighting infection Steroid medications, such as prednisone  or cortisone This list may not describe all possible interactions. Give your health care provider a list of all the medicines, herbs, non-prescription drugs, or dietary supplements you use. Also tell them if you smoke, drink alcohol, or use illegal drugs. Some items may interact with your medicine. What should I watch for while using this medication? Your condition will be monitored carefully while you are receiving this medication. You may need blood work while taking this medication. This medication may increase your risk of getting an infection. Call your care team for advice if you get a fever, chills, sore throat, or other symptoms of a cold or flu. Do not treat yourself. Try to avoid being around people who are sick. You should make sure you get enough calcium  and vitamin D  while you are taking this medication, unless your care team tells you not to. Discuss the foods you eat and the vitamins you take with your care team. Some people who take this medication have severe bone, joint, or muscle pain. This medication may also increase your risk for jaw problems or a broken thigh bone. Tell your care team right away if you have severe pain in your jaw, bones, joints, or muscles. Tell your care team if you have any pain that does not go away or that gets worse. Talk  to your care team if you may be pregnant. Serious birth defects can occur if you take this medication during pregnancy and for 5 months after the last dose. You will need a negative pregnancy test before starting this medication. Contraception is recommended while taking this medication and for 5 months after the last dose. Your care team  can help you find the option that works for you. What side effects may I notice from receiving this medication? Side effects that you should report to your care team as soon as possible: Allergic reactions--skin rash, itching, hives, swelling of the face, lips, tongue, or throat Bone, joint, or muscle pain Low calcium  level--muscle pain or cramps, confusion, tingling, or numbness in the hands or feet Osteonecrosis of the jaw--pain, swelling, or redness in the mouth, numbness of the jaw, poor healing after dental work, unusual discharge from the mouth, visible bones in the mouth Side effects that usually do not require medical attention (report to your care team if they continue or are bothersome): Cough Diarrhea Fatigue Headache Nausea This list may not describe all possible side effects. Call your doctor for medical advice about side effects. You may report side effects to FDA at 1-800-FDA-1088. Where should I keep my medication? This medication is given in a hospital or clinic. It will not be stored at home. NOTE: This sheet is a summary. It may not cover all possible information. If you have questions about this medicine, talk to your doctor, pharmacist, or health care provider.  2024 Elsevier/Gold Standard (2021-06-14 00:00:00)

## 2024-03-13 ENCOUNTER — Encounter: Payer: Self-pay | Admitting: Hematology & Oncology

## 2024-05-07 ENCOUNTER — Inpatient Hospital Stay

## 2024-05-07 ENCOUNTER — Inpatient Hospital Stay: Admitting: Hematology & Oncology

## 2024-08-18 ENCOUNTER — Ambulatory Visit: Admitting: Neurology

## 2024-08-20 ENCOUNTER — Telehealth: Admitting: Adult Health
